# Patient Record
Sex: Female | Born: 1975 | Race: Black or African American | Hispanic: No | Smoking: Never smoker
Health system: Southern US, Community
[De-identification: ages and names within clinical notes are randomized; demographics above are authoritative.]

## PROBLEM LIST (undated history)

## (undated) DIAGNOSIS — E785 Hyperlipidemia, unspecified: Secondary | ICD-10-CM

## (undated) DIAGNOSIS — R112 Nausea with vomiting, unspecified: Secondary | ICD-10-CM

## (undated) DIAGNOSIS — I2510 Atherosclerotic heart disease of native coronary artery without angina pectoris: Secondary | ICD-10-CM

## (undated) DIAGNOSIS — E119 Type 2 diabetes mellitus without complications: Secondary | ICD-10-CM

## (undated) DIAGNOSIS — D649 Anemia, unspecified: Secondary | ICD-10-CM

## (undated) DIAGNOSIS — I10 Essential (primary) hypertension: Secondary | ICD-10-CM

## (undated) DIAGNOSIS — Z9889 Other specified postprocedural states: Secondary | ICD-10-CM

## (undated) HISTORY — PX: CORONARY ANGIOPLASTY: SHX604

## (undated) HISTORY — DX: Hyperlipidemia, unspecified: E78.5

## (undated) HISTORY — DX: Nausea with vomiting, unspecified: R11.2

## (undated) HISTORY — DX: Other specified postprocedural states: Z98.890

---

## 1995-05-29 HISTORY — PX: TUBAL LIGATION: SHX77

## 1996-05-28 HISTORY — PX: WISDOM TOOTH EXTRACTION: SHX21

## 2004-05-28 HISTORY — PX: CHOLECYSTECTOMY: SHX55

## 2005-12-03 ENCOUNTER — Emergency Department (HOSPITAL_COMMUNITY): Admission: EM | Admit: 2005-12-03 | Discharge: 2005-12-03 | Payer: Self-pay | Admitting: Emergency Medicine

## 2006-04-27 ENCOUNTER — Emergency Department (HOSPITAL_COMMUNITY): Admission: EM | Admit: 2006-04-27 | Discharge: 2006-04-27 | Payer: Self-pay | Admitting: Emergency Medicine

## 2006-08-09 ENCOUNTER — Emergency Department (HOSPITAL_COMMUNITY): Admission: EM | Admit: 2006-08-09 | Discharge: 2006-08-09 | Payer: Self-pay | Admitting: Emergency Medicine

## 2006-09-02 ENCOUNTER — Emergency Department (HOSPITAL_COMMUNITY): Admission: EM | Admit: 2006-09-02 | Discharge: 2006-09-02 | Payer: Self-pay | Admitting: Emergency Medicine

## 2006-10-10 ENCOUNTER — Emergency Department (HOSPITAL_COMMUNITY): Admission: EM | Admit: 2006-10-10 | Discharge: 2006-10-10 | Payer: Self-pay | Admitting: Emergency Medicine

## 2006-10-15 ENCOUNTER — Ambulatory Visit: Payer: Self-pay | Admitting: Family Medicine

## 2007-03-14 ENCOUNTER — Emergency Department (HOSPITAL_COMMUNITY): Admission: EM | Admit: 2007-03-14 | Discharge: 2007-03-14 | Payer: Self-pay | Admitting: *Deleted

## 2007-09-01 ENCOUNTER — Ambulatory Visit: Payer: Self-pay | Admitting: Internal Medicine

## 2007-09-03 ENCOUNTER — Ambulatory Visit: Payer: Self-pay | Admitting: Internal Medicine

## 2007-09-04 ENCOUNTER — Emergency Department (HOSPITAL_COMMUNITY): Admission: EM | Admit: 2007-09-04 | Discharge: 2007-09-04 | Payer: Self-pay | Admitting: Emergency Medicine

## 2007-09-08 ENCOUNTER — Emergency Department (HOSPITAL_COMMUNITY): Admission: EM | Admit: 2007-09-08 | Discharge: 2007-09-08 | Payer: Self-pay | Admitting: Emergency Medicine

## 2007-09-10 ENCOUNTER — Emergency Department (HOSPITAL_COMMUNITY): Admission: EM | Admit: 2007-09-10 | Discharge: 2007-09-10 | Payer: Self-pay | Admitting: Emergency Medicine

## 2007-09-24 ENCOUNTER — Emergency Department (HOSPITAL_COMMUNITY): Admission: EM | Admit: 2007-09-24 | Discharge: 2007-09-24 | Payer: Self-pay | Admitting: Emergency Medicine

## 2007-11-20 ENCOUNTER — Ambulatory Visit: Payer: Self-pay | Admitting: Family Medicine

## 2007-11-20 LAB — CONVERTED CEMR LAB: Varicella IgG: 2.9 — ABNORMAL HIGH

## 2008-01-09 ENCOUNTER — Emergency Department (HOSPITAL_COMMUNITY): Admission: EM | Admit: 2008-01-09 | Discharge: 2008-01-09 | Payer: Self-pay | Admitting: Emergency Medicine

## 2008-05-28 ENCOUNTER — Emergency Department (HOSPITAL_COMMUNITY): Admission: EM | Admit: 2008-05-28 | Discharge: 2008-05-28 | Payer: Self-pay | Admitting: Emergency Medicine

## 2009-02-13 ENCOUNTER — Emergency Department (HOSPITAL_COMMUNITY): Admission: EM | Admit: 2009-02-13 | Discharge: 2009-02-13 | Payer: Self-pay | Admitting: Emergency Medicine

## 2010-09-01 LAB — POCT PREGNANCY, URINE: Preg Test, Ur: NEGATIVE

## 2010-09-01 LAB — URINALYSIS, ROUTINE W REFLEX MICROSCOPIC
Glucose, UA: NEGATIVE mg/dL
Ketones, ur: NEGATIVE mg/dL
pH: 6 (ref 5.0–8.0)

## 2011-02-20 LAB — WET PREP, GENITAL
Clue Cells Wet Prep HPF POC: NONE SEEN
Yeast Wet Prep HPF POC: NONE SEEN

## 2011-02-20 LAB — GC/CHLAMYDIA PROBE AMP, GENITAL
Chlamydia, DNA Probe: NEGATIVE
GC Probe Amp, Genital: NEGATIVE

## 2011-02-20 LAB — URINE MICROSCOPIC-ADD ON

## 2011-02-20 LAB — URINALYSIS, ROUTINE W REFLEX MICROSCOPIC
Bilirubin Urine: NEGATIVE
Glucose, UA: NEGATIVE
Hgb urine dipstick: NEGATIVE
Ketones, ur: 15 — AB
Nitrite: NEGATIVE
Protein, ur: 30 — AB
Specific Gravity, Urine: 1.03
Urobilinogen, UA: 1
pH: 6.5

## 2011-02-20 LAB — PREGNANCY, URINE: Preg Test, Ur: NEGATIVE

## 2011-02-23 LAB — D-DIMER, QUANTITATIVE: D-Dimer, Quant: 0.31

## 2012-04-29 ENCOUNTER — Other Ambulatory Visit: Payer: Self-pay | Admitting: Obstetrics

## 2012-04-29 DIAGNOSIS — N920 Excessive and frequent menstruation with regular cycle: Secondary | ICD-10-CM

## 2012-05-02 ENCOUNTER — Ambulatory Visit (HOSPITAL_COMMUNITY)
Admission: RE | Admit: 2012-05-02 | Discharge: 2012-05-02 | Disposition: A | Discharge status: Home / Self Care | Payer: Medicaid Other | Source: Ambulatory Visit | Attending: Obstetrics | Admitting: Obstetrics

## 2012-05-02 ENCOUNTER — Ambulatory Visit (HOSPITAL_COMMUNITY): Payer: Self-pay

## 2012-05-02 DIAGNOSIS — N920 Excessive and frequent menstruation with regular cycle: Secondary | ICD-10-CM

## 2012-08-13 ENCOUNTER — Telehealth: Payer: Self-pay | Admitting: *Deleted

## 2012-08-13 NOTE — Telephone Encounter (Signed)
Pt states she is experiencing muscle cramps and leg stiffness since starting on medication from Dr Clearance Coots. She is currently taking Diazide, Provera, Tindamax and iron. Could she be having a side effect to one of these?

## 2012-08-20 NOTE — Telephone Encounter (Signed)
Stop Provera.

## 2012-08-21 NOTE — Telephone Encounter (Signed)
Pt notified to stop her provera- she has an appointment next week at which time you can reassess.

## 2012-08-28 ENCOUNTER — Ambulatory Visit: Payer: Self-pay | Admitting: Obstetrics

## 2012-09-10 ENCOUNTER — Encounter: Payer: Self-pay | Admitting: Obstetrics

## 2012-09-10 ENCOUNTER — Ambulatory Visit (INDEPENDENT_AMBULATORY_CARE_PROVIDER_SITE_OTHER): Payer: Medicaid Other | Admitting: Obstetrics

## 2012-09-10 VITALS — BP 125/87 | HR 80 | Temp 97.6°F | Ht 62.0 in | Wt 218.0 lb

## 2012-09-10 DIAGNOSIS — Z01818 Encounter for other preprocedural examination: Secondary | ICD-10-CM

## 2012-09-10 DIAGNOSIS — R350 Frequency of micturition: Secondary | ICD-10-CM

## 2012-09-10 DIAGNOSIS — N920 Excessive and frequent menstruation with regular cycle: Secondary | ICD-10-CM

## 2012-09-10 HISTORY — DX: Excessive and frequent menstruation with regular cycle: N92.0

## 2012-09-10 LAB — CBC
HCT: 35.2 % — ABNORMAL LOW (ref 36.0–46.0)
MCH: 27.6 pg (ref 26.0–34.0)
MCV: 81.1 fL (ref 78.0–100.0)
Platelets: 391 10*3/uL (ref 150–400)
RBC: 4.34 MIL/uL (ref 3.87–5.11)

## 2012-09-10 LAB — POCT URINALYSIS DIPSTICK
Bilirubin, UA: NEGATIVE
Blood, UA: NEGATIVE
Nitrite, UA: NEGATIVE
Urobilinogen, UA: NEGATIVE
pH, UA: 5

## 2012-09-10 NOTE — Patient Instructions (Signed)
Endometrial Ablation

## 2012-09-10 NOTE — Progress Notes (Signed)
Chief Complaint: 37 y.o. gravida 2 para 2 who presents with menorrhagia.  Details of Present Illness: Hx of heavy periods with large clots.  Does not want hysterectomy.  Ablation recommended and agreed to.  BP 125/87  Pulse 80  Temp(Src) 97.6 F (36.4 C) (Oral)  Ht 5\' 2"  (1.575 m)  Wt 218 lb (98.884 kg)  BMI 39.86 kg/m2  LMP 08/25/2012  No past medical history on file. History   Social History  . Marital Status: Single    Spouse Name: Maceo Pro    Number of Children: 2  . Years of Education: N/A   Occupational History  . Security Chiropractor   Social History Main Topics  . Smoking status: Never Smoker   . Smokeless tobacco: Never Used  . Alcohol Use: No  . Drug Use: No  . Sexually Active: Yes -- Female partner(s)    Birth Control/ Protection: Condom   Other Topics Concern  . Not on file   Social History Narrative  . No narrative on file   Family History  Problem Relation Age of Onset  . COPD    . Hypertension    . Diabetes    . Cancer      Pertinent items are noted in HPI.  Pre-Op Diagnosis: Menorrhagia  Planned Procedure: [facility] Endometrial Ablation  I have reviewed the patient's history and have completed the physical exam and Amy [name] is acceptable for surgery.  Zackery Barefoot, CMA 09/10/2012 10:01 AM

## 2012-10-17 ENCOUNTER — Emergency Department (HOSPITAL_COMMUNITY): Payer: Medicaid Other

## 2012-10-17 ENCOUNTER — Emergency Department (HOSPITAL_COMMUNITY)
Admission: EM | Admit: 2012-10-17 | Discharge: 2012-10-17 | Disposition: A | Discharge status: Home / Self Care | Payer: Medicaid Other | Attending: Emergency Medicine | Admitting: Emergency Medicine

## 2012-10-17 ENCOUNTER — Encounter (HOSPITAL_COMMUNITY): Payer: Self-pay | Admitting: *Deleted

## 2012-10-17 DIAGNOSIS — J9801 Acute bronchospasm: Secondary | ICD-10-CM | POA: Insufficient documentation

## 2012-10-17 DIAGNOSIS — R05 Cough: Secondary | ICD-10-CM | POA: Insufficient documentation

## 2012-10-17 DIAGNOSIS — Z79899 Other long term (current) drug therapy: Secondary | ICD-10-CM | POA: Insufficient documentation

## 2012-10-17 DIAGNOSIS — I10 Essential (primary) hypertension: Secondary | ICD-10-CM | POA: Insufficient documentation

## 2012-10-17 DIAGNOSIS — R0602 Shortness of breath: Secondary | ICD-10-CM | POA: Insufficient documentation

## 2012-10-17 DIAGNOSIS — D649 Anemia, unspecified: Secondary | ICD-10-CM | POA: Insufficient documentation

## 2012-10-17 DIAGNOSIS — R071 Chest pain on breathing: Secondary | ICD-10-CM | POA: Insufficient documentation

## 2012-10-17 DIAGNOSIS — Z792 Long term (current) use of antibiotics: Secondary | ICD-10-CM | POA: Insufficient documentation

## 2012-10-17 DIAGNOSIS — R059 Cough, unspecified: Secondary | ICD-10-CM | POA: Insufficient documentation

## 2012-10-17 HISTORY — DX: Essential (primary) hypertension: I10

## 2012-10-17 HISTORY — DX: Anemia, unspecified: D64.9

## 2012-10-17 LAB — BASIC METABOLIC PANEL
BUN: 11 mg/dL (ref 6–23)
Calcium: 9.3 mg/dL (ref 8.4–10.5)
Creatinine, Ser: 0.77 mg/dL (ref 0.50–1.10)
GFR calc non Af Amer: >90 mL/min (ref >90)
Glucose, Bld: 138 mg/dL — ABNORMAL HIGH (ref 70–99)
Sodium: 136 mEq/L (ref 135–145)

## 2012-10-17 LAB — CBC
Hemoglobin: 11 g/dL — ABNORMAL LOW (ref 12.0–15.0)
MCH: 28.6 pg (ref 26.0–34.0)
MCHC: 33.7 g/dL (ref 30.0–36.0)
MCV: 84.7 fL (ref 78.0–100.0)

## 2012-10-17 LAB — POCT I-STAT TROPONIN I

## 2012-10-17 MED ORDER — PREDNISONE 20 MG PO TABS: 40.0000 mg | ORAL_TABLET | Freq: Every day | ORAL | Status: DC

## 2012-10-17 MED ORDER — ALBUTEROL SULFATE HFA 108 (90 BASE) MCG/ACT IN AERS
2.0000 | INHALATION_SPRAY | RESPIRATORY_TRACT | Status: DC | PRN
  Administered 2012-10-17: 2 via RESPIRATORY_TRACT
  Filled 2012-10-17: qty 6.7
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
## 2012-10-17 NOTE — ED Notes (Signed)
Pt reports having mid and left side chest pains, started yesterday. Increases with cough and breathing. No acute distress noted, ekg done at triage.

## 2012-11-06 ENCOUNTER — Encounter: Payer: Self-pay | Admitting: Obstetrics & Gynecology

## 2013-03-09 ENCOUNTER — Institutional Professional Consult (permissible substitution): Payer: Medicaid Other | Admitting: Obstetrics

## 2013-03-10 ENCOUNTER — Institutional Professional Consult (permissible substitution): Payer: Medicaid Other | Admitting: Obstetrics

## 2013-04-02 ENCOUNTER — Institutional Professional Consult (permissible substitution): Payer: Medicaid Other | Admitting: Obstetrics

## 2013-06-04 ENCOUNTER — Other Ambulatory Visit: Payer: Self-pay | Admitting: Obstetrics

## 2013-06-09 ENCOUNTER — Ambulatory Visit (INDEPENDENT_AMBULATORY_CARE_PROVIDER_SITE_OTHER): Payer: Medicaid Other | Admitting: Obstetrics

## 2013-06-09 ENCOUNTER — Encounter: Payer: Self-pay | Admitting: Obstetrics

## 2013-06-09 VITALS — BP 124/92 | HR 76 | Temp 97.9°F | Ht 62.0 in | Wt 227.0 lb

## 2013-06-09 DIAGNOSIS — N946 Dysmenorrhea, unspecified: Secondary | ICD-10-CM

## 2013-06-09 DIAGNOSIS — N920 Excessive and frequent menstruation with regular cycle: Secondary | ICD-10-CM

## 2013-06-09 MED ORDER — NORETHINDRONE ACETATE 5 MG PO TABS: 10.0000 mg | ORAL_TABLET | Freq: Every day | ORAL | Status: DC

## 2013-06-09 MED ORDER — IBUPROFEN 800 MG PO TABS: 800.0000 mg | ORAL_TABLET | Freq: Three times a day (TID) | ORAL | Status: DC | PRN

## 2013-06-09 NOTE — Progress Notes (Signed)
Subjective:     Letta MoynahanShaketta Darden is a 38 y.o. female here for a consult.  Current complaints: continues to have heavy menstrual cycles with large clots.  Personal health questionnaire reviewed: yes.   Gynecologic History Patient's last menstrual period was 05/11/2013. Contraception: none Last Pap: 2013. Results were: normal Last mammogram: N/A  Obstetric History OB History  No data available     The following portions of the patient's history were reviewed and updated as appropriate: allergies, current medications, past family history, past medical history, past social history, past surgical history and problem list.  Review of Systems Pertinent items are noted in HPI.    Objective:    No exam performed today, Consult only..    Assessment:    Healthy female exam.    Plan:    Education reviewed: Management of AUB.. Follow up in: 3 weeks. Aygestin 10 mg daily x 30 days, then Ablation procedure.

## 2013-06-30 ENCOUNTER — Ambulatory Visit: Payer: Medicaid Other | Admitting: Obstetrics

## 2013-07-02 ENCOUNTER — Other Ambulatory Visit: Payer: Self-pay | Admitting: *Deleted

## 2013-07-02 ENCOUNTER — Encounter: Payer: Self-pay | Admitting: Obstetrics

## 2013-07-02 ENCOUNTER — Ambulatory Visit (INDEPENDENT_AMBULATORY_CARE_PROVIDER_SITE_OTHER): Payer: Medicaid Other | Admitting: Obstetrics

## 2013-07-02 VITALS — BP 139/83 | HR 82 | Temp 97.5°F | Ht 62.0 in | Wt 223.0 lb

## 2013-07-02 DIAGNOSIS — N920 Excessive and frequent menstruation with regular cycle: Secondary | ICD-10-CM

## 2013-07-02 NOTE — Progress Notes (Signed)
Subjective:     Amy [name] is a 38 y.o. female here for a routine exam.  Current complaints: pre-op visit to discuss ablation procedure.  Personal health questionnaire reviewed: yes.   Gynecologic History Patient's last menstrual period was 05/11/2013. Contraception: tubal ligation  Obstetric History OB History  Gravida Para Term Preterm AB SAB TAB Ectopic Multiple Living  2 2 2       2     # Outcome Date GA Lbr Len/2nd Weight Sex Delivery Anes PTL Lv  2 TRM 04/17/96 6547w0d  7 lb 1 oz (3.204 kg) M SVD EPI  Y  1 TRM 07/19/94 6847w0d  5 lb 13 oz (2.637 kg) F SVD None  Y       The following portions of the patient's history were reviewed and updated as appropriate: allergies, current medications, past family history, past medical history, past social history, past surgical history and problem list.  Review of Systems Pertinent items are noted in HPI.    Objective:    No exam performed today, Consult only.    Assessment:    AUB.   Plan:    F/U post op.    Endometrial Ablation scheduled 07-10-13.

## 2013-07-06 ENCOUNTER — Encounter: Payer: Self-pay | Admitting: Obstetrics

## 2013-07-06 ENCOUNTER — Encounter (HOSPITAL_COMMUNITY): Payer: Self-pay | Admitting: Pharmacist

## 2013-07-08 ENCOUNTER — Encounter (HOSPITAL_COMMUNITY)
Admission: RE | Admit: 2013-07-08 | Discharge: 2013-07-08 | Disposition: A | Discharge status: Home / Self Care | Payer: Medicaid Other | Source: Ambulatory Visit | Attending: Obstetrics | Admitting: Obstetrics

## 2013-07-08 ENCOUNTER — Encounter (HOSPITAL_COMMUNITY): Payer: Self-pay

## 2013-07-08 LAB — CBC
HEMATOCRIT: 36.2 % (ref 36.0–46.0)
Hemoglobin: 11.7 g/dL — ABNORMAL LOW (ref 12.0–15.0)
MCH: 27.4 pg (ref 26.0–34.0)
MCHC: 32.3 g/dL (ref 30.0–36.0)
MCV: 84.8 fL (ref 78.0–100.0)
PLATELETS: 431 10*3/uL — AB (ref 150–400)
RBC: 4.27 MIL/uL (ref 3.87–5.11)
RDW: 13.8 % (ref 11.5–15.5)
WBC: 7.7 10*3/uL (ref 4.0–10.5)

## 2013-07-08 LAB — BASIC METABOLIC PANEL
BUN: 13 mg/dL (ref 6–23)
CALCIUM: 10.1 mg/dL (ref 8.4–10.5)
CHLORIDE: 100 meq/L (ref 96–112)
CO2: 27 meq/L (ref 19–32)
CREATININE: 0.78 mg/dL (ref 0.50–1.10)
GFR calc Af Amer: >90 mL/min (ref >90)
GFR calc non Af Amer: >90 mL/min (ref >90)
Glucose, Bld: 103 mg/dL — ABNORMAL HIGH (ref 70–99)
Potassium: 4.3 mEq/L (ref 3.7–5.3)
Sodium: 137 mEq/L (ref 137–147)

## 2013-07-08 NOTE — Patient Instructions (Signed)
20 Nani SkillernShaketta [name]  07/08/2013   Your procedure is scheduled on:  07/10/13  [facility]30 AM.  Pick up the phone at the desk and dial [phone].   Call this number if you have problems the morning of surgery: [phone]   Remember:   Do not eat food:After Midnight.  Do not drink clear liquids: After Midnight.  Take these medicines the morning of surgery with A SIP OF WATER: Blood pressure medication   Do not wear jewelry, make-up or nail polish.  Do not wear lotions, powders, or perfumes. You may wear deodorant.  Do not shave 48 hours prior to surgery.  Do not bring valuables to the hospital.  [facility] is not   responsible for any belongings or valuables brought to the hospital.  Contacts, dentures or bridgework may not be worn into surgery.  Leave suitcase in the car. After surgery it may be brought to your room.  For patients admitted to the hospital, checkout time is 11:00 AM the day of              discharge.   Patients discharged the day of surgery will not be allowed to drive             home.  Name and phone number of your driver: husband   [name]  Special Instructions:     Please read over the following fact sheets that you were given:   Surgical Site Infection Prevention

## 2013-07-10 ENCOUNTER — Ambulatory Visit (HOSPITAL_COMMUNITY)
Admission: RE | Admit: 2013-07-10 | Discharge: 2013-07-10 | Disposition: A | Discharge status: Home / Self Care | Payer: Medicaid Other | Source: Ambulatory Visit | Attending: Obstetrics | Admitting: Obstetrics

## 2013-07-10 ENCOUNTER — Ambulatory Visit (HOSPITAL_COMMUNITY): Payer: Medicaid Other | Admitting: Anesthesiology

## 2013-07-10 ENCOUNTER — Encounter (HOSPITAL_COMMUNITY): Payer: Medicaid Other | Admitting: Anesthesiology

## 2013-07-10 ENCOUNTER — Encounter (HOSPITAL_COMMUNITY)
Admission: RE | Disposition: A | Discharge status: Home / Self Care | Payer: Self-pay | Source: Ambulatory Visit | Attending: Obstetrics

## 2013-07-10 DIAGNOSIS — D649 Anemia, unspecified: Secondary | ICD-10-CM | POA: Insufficient documentation

## 2013-07-10 DIAGNOSIS — N840 Polyp of corpus uteri: Secondary | ICD-10-CM | POA: Insufficient documentation

## 2013-07-10 DIAGNOSIS — N920 Excessive and frequent menstruation with regular cycle: Secondary | ICD-10-CM

## 2013-07-10 DIAGNOSIS — N925 Other specified irregular menstruation: Secondary | ICD-10-CM | POA: Insufficient documentation

## 2013-07-10 DIAGNOSIS — N946 Dysmenorrhea, unspecified: Secondary | ICD-10-CM | POA: Insufficient documentation

## 2013-07-10 DIAGNOSIS — I10 Essential (primary) hypertension: Secondary | ICD-10-CM | POA: Insufficient documentation

## 2013-07-10 DIAGNOSIS — N938 Other specified abnormal uterine and vaginal bleeding: Secondary | ICD-10-CM | POA: Insufficient documentation

## 2013-07-10 DIAGNOSIS — N949 Unspecified condition associated with female genital organs and menstrual cycle: Secondary | ICD-10-CM | POA: Insufficient documentation

## 2013-07-10 HISTORY — PX: DILITATION & CURRETTAGE/HYSTROSCOPY WITH HYDROTHERMAL ABLATION: SHX5570

## 2013-07-10 LAB — PREGNANCY, URINE: PREG TEST UR: NEGATIVE

## 2013-07-10 SURGERY — DILATATION & CURETTAGE/HYSTEROSCOPY WITH HYDROTHERMAL ABLATION
Anesthesia: General | Site: Vagina

## 2013-07-10 MED ORDER — OXYCODONE HCL 10 MG PO TABS: 10.0000 mg | ORAL_TABLET | Freq: Four times a day (QID) | ORAL | Status: DC | PRN

## 2013-07-10 MED ORDER — LIDOCAINE HCL 1 % IJ SOLN
INTRAMUSCULAR | Status: AC
  Filled 2013-07-10: qty 20

## 2013-07-10 MED ORDER — MIDAZOLAM HCL 2 MG/2ML IJ SOLN
INTRAMUSCULAR | Status: AC
  Filled 2013-07-10: qty 2

## 2013-07-10 MED ORDER — PROPOFOL 10 MG/ML IV EMUL
INTRAVENOUS | Status: AC
  Filled 2013-07-10: qty 20

## 2013-07-10 MED ORDER — MIDAZOLAM HCL 2 MG/2ML IJ SOLN: 0.5000 mg | Freq: Once | INTRAMUSCULAR | Status: DC | PRN

## 2013-07-10 MED ORDER — PROMETHAZINE HCL 25 MG/ML IJ SOLN
INTRAMUSCULAR | Status: AC
  Filled 2013-07-10: qty 1

## 2013-07-10 MED ORDER — KETOROLAC TROMETHAMINE 30 MG/ML IJ SOLN: 15.0000 mg | Freq: Once | INTRAMUSCULAR | Status: DC | PRN

## 2013-07-10 MED ORDER — FENTANYL CITRATE 0.05 MG/ML IJ SOLN
25.0000 ug | INTRAMUSCULAR | Status: DC | PRN
  Administered 2013-07-10 (×3): 50 ug via INTRAVENOUS

## 2013-07-10 MED ORDER — MEPERIDINE HCL 25 MG/ML IJ SOLN: 6.2500 mg | INTRAMUSCULAR | Status: DC | PRN

## 2013-07-10 MED ORDER — LIDOCAINE HCL (CARDIAC) 20 MG/ML IV SOLN
INTRAVENOUS | Status: AC
  Filled 2013-07-10: qty 5

## 2013-07-10 MED ORDER — KETOROLAC TROMETHAMINE 30 MG/ML IJ SOLN
INTRAMUSCULAR | Status: AC
  Filled 2013-07-10: qty 1

## 2013-07-10 MED ORDER — LACTATED RINGERS IV SOLN: INTRAVENOUS | Status: DC

## 2013-07-10 MED ORDER — DEXAMETHASONE SODIUM PHOSPHATE 10 MG/ML IJ SOLN
INTRAMUSCULAR | Status: DC | PRN
  Administered 2013-07-10: 10 mg via INTRAVENOUS

## 2013-07-10 MED ORDER — DEXAMETHASONE SODIUM PHOSPHATE 10 MG/ML IJ SOLN
INTRAMUSCULAR | Status: AC
  Filled 2013-07-10: qty 1

## 2013-07-10 MED ORDER — SODIUM CHLORIDE 0.9 % IR SOLN
Status: DC | PRN
  Administered 2013-07-10: 3000 mL

## 2013-07-10 MED ORDER — ONDANSETRON HCL 4 MG/2ML IJ SOLN
INTRAMUSCULAR | Status: DC | PRN
  Administered 2013-07-10: 4 mg via INTRAVENOUS

## 2013-07-10 MED ORDER — LIDOCAINE HCL (CARDIAC) 20 MG/ML IV SOLN
INTRAVENOUS | Status: DC | PRN
  Administered 2013-07-10: 100 mg via INTRAVENOUS

## 2013-07-10 MED ORDER — LIDOCAINE HCL 1 % IJ SOLN
INTRAMUSCULAR | Status: DC | PRN
  Administered 2013-07-10: 20 mL

## 2013-07-10 MED ORDER — FENTANYL CITRATE 0.05 MG/ML IJ SOLN
INTRAMUSCULAR | Status: AC
  Filled 2013-07-10: qty 2

## 2013-07-10 MED ORDER — LACTATED RINGERS IV SOLN
INTRAVENOUS | Status: DC | PRN
  Administered 2013-07-10: 14:00:00 via INTRAVENOUS

## 2013-07-10 MED ORDER — FENTANYL CITRATE 0.05 MG/ML IJ SOLN
INTRAMUSCULAR | Status: AC
Start: 2013-07-10 — End: 2013-07-10
  Filled 2013-07-10: qty 2

## 2013-07-10 MED ORDER — SILVER NITRATE-POT NITRATE 75-25 % EX MISC
CUTANEOUS | Status: DC | PRN
  Administered 2013-07-10: 1 via TOPICAL

## 2013-07-10 MED ORDER — ONDANSETRON HCL 4 MG/2ML IJ SOLN
INTRAMUSCULAR | Status: AC
  Filled 2013-07-10: qty 2

## 2013-07-10 MED ORDER — PROMETHAZINE HCL 25 MG/ML IJ SOLN
INTRAMUSCULAR | Status: AC
  Administered 2013-07-10: 12.5 mg via INTRAVENOUS
  Filled 2013-07-10: qty 1

## 2013-07-10 MED ORDER — FENTANYL CITRATE 0.05 MG/ML IJ SOLN
INTRAMUSCULAR | Status: DC | PRN
  Administered 2013-07-10 (×2): 100 ug via INTRAVENOUS

## 2013-07-10 MED ORDER — PROPOFOL 10 MG/ML IV BOLUS
INTRAVENOUS | Status: DC | PRN
  Administered 2013-07-10: 200 mg via INTRAVENOUS

## 2013-07-10 MED ORDER — PROMETHAZINE HCL 25 MG/ML IJ SOLN
6.2500 mg | INTRAMUSCULAR | Status: DC | PRN
  Administered 2013-07-10: 12.5 mg via INTRAVENOUS

## 2013-07-10 MED ORDER — KETOROLAC TROMETHAMINE 30 MG/ML IJ SOLN
INTRAMUSCULAR | Status: DC | PRN
  Administered 2013-07-10: 30 mg via INTRAVENOUS

## 2013-07-10 MED ORDER — FENTANYL CITRATE 0.05 MG/ML IJ SOLN
INTRAMUSCULAR | Status: AC
Start: 2013-07-10 — End: 2013-07-10
  Administered 2013-07-10: 50 ug via INTRAVENOUS
  Filled 2013-07-10: qty 2

## 2013-07-10 SURGICAL SUPPLY — 22 items
CATH ROBINSON RED A/P 16FR (CATHETERS) ×1 IMPLANT
CLOTH BEACON ORANGE TIMEOUT ST (SAFETY) ×2 IMPLANT
CONTAINER PREFILL 10% NBF 60ML (FORM) ×3 IMPLANT
DRAPE HYSTEROSCOPY (DRAPE) ×2 IMPLANT
DRSG TELFA 3X8 NADH (GAUZE/BANDAGES/DRESSINGS) ×2 IMPLANT
ELECT REM PT RETURN 9FT ADLT (ELECTROSURGICAL)
ELECTRODE REM PT RTRN 9FT ADLT (ELECTROSURGICAL) IMPLANT
GLOVE BIO SURGEON STRL SZ8 (GLOVE) ×4 IMPLANT
GOWN STRL REUS W/ TWL XL LVL3 (GOWN DISPOSABLE) ×1 IMPLANT
GOWN STRL REUS W/TWL LRG LVL3 (GOWN DISPOSABLE) ×2 IMPLANT
GOWN STRL REUS W/TWL XL LVL3 (GOWN DISPOSABLE) ×2
NDL SPNL 22GX3.5 QUINCKE BK (NEEDLE) ×2 IMPLANT
NEEDLE SPNL 22GX3.5 QUINCKE BK (NEEDLE) ×4 IMPLANT
NS IRRIG 1000ML POUR BTL (IV SOLUTION) ×1 IMPLANT
PACK VAGINAL MINOR WOMEN LF (CUSTOM PROCEDURE TRAY) ×2 IMPLANT
PAD DRESSING TELFA 3X8 NADH (GAUZE/BANDAGES/DRESSINGS) ×1 IMPLANT
PAD OB MATERNITY 4.3X12.25 (PERSONAL CARE ITEMS) ×2 IMPLANT
SET GENESYS HTA PROCERVA (MISCELLANEOUS) ×1 IMPLANT
SYR CONTROL 10ML LL (SYRINGE) ×4 IMPLANT
TOWEL OR 17X24 6PK STRL BLUE (TOWEL DISPOSABLE) ×4 IMPLANT
TUBING HYDROFLEX HYSTEROSCOPY (TUBING) ×2 IMPLANT
WATER STERILE IRR 1000ML POUR (IV SOLUTION) ×2 IMPLANT

## 2013-07-10 NOTE — Discharge Instructions (Signed)
D&C/Endometrial Ablation Care After Read the instructions below. Refer to this sheet in the next few weeks. These instructions provide you with general information on caring for yourself after you leave the hospital. Your caregiver may also give you specific instructions.  A D&C/endometrial ablation  is a minor operation. A D&C involves the stretching (dilatation) of the cervix and scraping (curettage) of the inside lining of the uterus. Endometrial ablation (EA) is a surgery that makes a woman's period much lighter or stops it completely.  You may have light cramping and bleeding for a couple of days to two weeks after the procedure. This procedure may be done in a hospital, outpatient clinic, or doctor's office. You may be given a drug to make you sleep (general anesthetic) or a drug that numbs the area (local anesthetic) in and around the cervix. HOME CARE INSTRUCTIONS  Do not drive for 24 hours.   Wait one week before returning to strenuous activities.   Take your temperature two times a day for 4 days and write it down. Provide these temperatures to your caregiver if they are abnormal (above 98.6 F or 37.0 C).   Avoid long periods of standing, and do no heavy lifting (more than 10 pounds), pushing or pulling.   Limit stair climbing to once or twice a day.   Take rest periods often.   You may resume your usual diet.   Drink plenty of fluids (6-8 glasses a day).   You should return to your usual bowel function. If constipation should occur, you may:   Take a mild laxative with permission from your caregiver.   Add fruit and bran to your diet.   Drink more fluids. This helps with constipation.   Take showers instead of baths until your caregiver gives you permission to take baths.   Do not go swimming or use a hot tub until your caregiver gives you permission.   Try to have someone with you or available for you the first 24 to 48 hours, especially if you had a general  anesthetic.   Do not douche, use tampons, or have intercourse until after your follow-up appointment, or when your caregiver approves.   Only take over-the-counter or prescription medicines for pain, discomfort, or fever as directed by your caregiver. Do not take aspirin. It can cause bleeding.   If a prescription was given, follow your caregiver's directions. You may be given a medicine that kills germs (antibiotic) to prevent an infection.   Keep all your follow-up appointments recommended by your caregiver.  SEEK MEDICAL CARE IF:  You have increasing cramps or pain not relieved with medication.   You develop belly (abdominal) pain which does not seem to be related to the same area of earlier cramping and pain.   You feel dizzy or feel like fainting.   You have bad smelling vaginal discharge.   You develop a rash.   You develop a reaction or allergy to your medication.  SEEK IMMEDIATE MEDICAL CARE IF:  Bleeding is heavier than a normal menstrual period.   You have an oral temperature above 100.6, not controlled by medicine.   You develop chest pain.   You develop shortness of breath.   You pass out.   You develop pain in your shoulder strap area.   You develop heavy vaginal bleeding with or without blood clots.  MAKE SURE YOU:   Understand these instructions.   Will watch your condition.   Will get help right away if   you are not doing well or get worse.  Document Released: 05/11/2000 Document Re-Released: 11/01/2009 Exit[employer] Patient Information 2011 [employer].  

## 2013-07-10 NOTE — Transfer of Care (Signed)
Immediate Anesthesia Transfer of Care Note  [name]  Procedure(s) Performed: Procedure(s): DILATATION & CURETTAGE/HYSTEROSCOPY WITH HYDROTHERMAL ABLATION (N/A)  Patient Location: PACU  Anesthesia Type:General  Level of Consciousness: awake, alert  and oriented  Airway & Oxygen Therapy: Patient Spontanous Breathing and Patient connected to nasal cannula oxygen  Post-op Assessment: Report given to PACU RN and Post -op Vital signs reviewed and stable  Post vital signs: Reviewed and stable  Complications: No apparent anesthesia complications

## 2013-07-10 NOTE — Op Note (Signed)
Preop Diagnosis: MENORRHAGEA, ABNORMAL UTERINE BLEEDING   Postop Diagnosis: MENORRHAGEA, ABNORMAL UTERINE BLEEDING   Procedure: DILATATION & CURETTAGE/HYSTEROSCOPY WITH HYDROTHERMAL ABLATION   Anesthesia: General   Anesthesiologist: Brayton CavesFreeman Jackson, MD   Attending: Brock Badharles A Harper, MD   Assistant:  Surgical Technician  Findings:  Normal endometrial cavity  Pathology:  Endometrial curettings  Fluids:  900ml  UOP:  50ml  EBL:  Minimal  Complications:  None  Procedure: The patient was taken to the operating room after risks benefits and alternatives were discussed with patient, the patient verbalized understanding and consent signed and witnessed. The patient was placed under general anesthesia and prepped and draped in normal sterile fashion. A bivalve speculum was placed in the patient's vagina and the anterior lip of the cervix was grasped with a single-tooth tenaculum. The cervix was dilated for passage of the hysteroscope. The uterus sounded to 8cm.  The hysteroscope was introduced into the uterine cavity with findings as noted above. A curettage was performed and currettings sent to pathology. The hysteroscope was reintroduced and hydrothermal ablation was performed without difficulty. The tenaculum and bivalve speculum were removed and there was good hemostasis at the tenaculum sites. Sponge lap and needle count was correct. The patient tolerated procedure well and was returned to the recovery room in good condition.

## 2013-07-10 NOTE — Anesthesia Procedure Notes (Signed)
Procedure Name: LMA Insertion Date/Time: 07/10/2013 2:29 PM Performed by: Rehman Levinson, Jannet AskewHARLESETTA M Pre-anesthesia Checklist: Patient identified, Timeout performed, Emergency Drugs available, Suction available and Patient being monitored Patient Re-evaluated:Patient Re-evaluated prior to inductionPreoxygenation: Pre-oxygenation with 100% oxygen Intubation Type: IV induction LMA: LMA inserted LMA Size: 4.0 Number of attempts: 1 Dental Injury: Teeth and Oropharynx as per pre-operative assessment

## 2013-07-10 NOTE — H&P (Signed)
Amy [name] is an 38 y.o. female.   Presents for Endometrial Ablation.  Pertinent Gynecological History: Menses: flow is moderate Bleeding: dysfunctional uterine bleeding Contraception: tubal ligation DES exposure: denies Blood transfusions: none Sexually transmitted diseases: no past history Previous GYN Procedures: BTL  Last mammogram: n/a Date: n/a Last pap: normal Date: 2013 OB History: G2, P2   Menstrual History: Menarche age: not asked  No LMP recorded.    Past Medical History  Diagnosis Date  . Anemia   . Hypertension     Past Surgical History  Procedure Laterality Date  . Tubal ligation  1997  . Cholecystectomy  2006  . Wisdom tooth extraction  1998    Family History  Problem Relation Age of Onset  . COPD    . Hypertension    . Diabetes    . Cancer      Social History:  reports that she has never smoked. She has never used smokeless tobacco. She reports that she does not drink alcohol or use illicit drugs.  Allergies: No Known Allergies  Prescriptions prior to admission  Medication Sig Dispense Refill  . ibuprofen (ADVIL,MOTRIN) 800 MG tablet Take 1 tablet (800 mg total) by mouth every 8 (eight) hours as needed for moderate pain.  30 tablet  5  . Iron-FA-B Cmp-C-Biot-Probiotic (FUSION PLUS) CAPS Take 1 capsule by mouth daily.      . norethindrone (AYGESTIN) 5 MG tablet Take 2 tablets (10 mg total) by mouth daily.  60 tablet  0  . triamterene-hydrochlorothiazide (DYAZIDE) 37.5-25 MG per capsule Take 1 capsule by mouth every morning.        Review of Systems  All other systems reviewed and are negative.    Height 5\' 2"  (1.575 m), weight 223 lb (101.152 kg). Physical Exam  Nursing note and vitals reviewed. Constitutional: She is oriented to person, place, and time. She appears well-developed and well-nourished.  HENT:  Head: Normocephalic and atraumatic.  Eyes: Conjunctivae are normal. Pupils are equal, round, and reactive to light.  Neck:  Normal range of motion. Neck supple.  Cardiovascular: Normal rate and regular rhythm.   Respiratory: Effort normal.  GI: Soft.  Genitourinary: Vagina normal and uterus normal.  Musculoskeletal: Normal range of motion.  Neurological: She is alert and oriented to person, place, and time.  Skin: Skin is warm and dry.  Psychiatric: She has a normal mood and affect. Her behavior is normal. Judgment and thought content normal.    No results found for this or any previous visit (from the past 24 hour(s)).  No results found.  Assessment/Plan: 38 yo G2 P2.  H/O heavy, painful periods and subsequent anemia.  Desires Endometrial Ablation after full discussion of available options.  All questions answered to patient's satisfaction.  Will proceed with planned HTA of endometrium.  HARPER,CHARLES A 07/10/2013, 12:30 PM

## 2013-07-10 NOTE — Anesthesia Postprocedure Evaluation (Signed)
  Anesthesia Post Note  [name]  Procedure(s) Performed: Procedure(s) (LRB): DILATATION & CURETTAGE/HYSTEROSCOPY WITH HYDROTHERMAL ABLATION (N/A)  Anesthesia type: GA  Patient location: PACU  Post pain: Pain level controlled  Post assessment: Post-op Vital signs reviewed  Last Vitals:  Filed Vitals:   07/10/13 1545  BP: 129/68  Pulse: 77  Temp:   Resp: 18    Post vital signs: Reviewed  Level of consciousness: sedated  Complications: No apparent anesthesia complications

## 2013-07-10 NOTE — Anesthesia Preprocedure Evaluation (Signed)
Anesthesia Evaluation  Patient identified by MRN, date of birth, ID band Patient awake    Reviewed: Allergy & Precautions, H&P , Patient's Chart, lab work & pertinent test results, reviewed documented beta blocker date and time   History of Anesthesia Complications Negative for: history of anesthetic complications  Airway Mallampati: II  TM Distance: >3 FB Neck ROM: full    Dental   Pulmonary  breath sounds clear to auscultation        Cardiovascular Exercise Tolerance: Good hypertension, Rhythm:regular Rate:Normal     Neuro/Psych negative psych ROS   GI/Hepatic   Endo/Other  Morbid obesity  Renal/GU      Musculoskeletal   Abdominal   Peds  Hematology  (+) anemia ,   Anesthesia Other Findings   Reproductive/Obstetrics                             Anesthesia Physical Anesthesia Plan  ASA: III  Anesthesia Plan: General LMA   Post-op Pain Management:    Induction:   Airway Management Planned:   Additional Equipment:   Intra-op Plan:   Post-operative Plan:   Informed Consent: I have reviewed the patients History and Physical, chart, labs and discussed the procedure including the risks, benefits and alternatives for the proposed anesthesia with the patient or authorized representative who has indicated his/her understanding and acceptance.   Dental Advisory Given  Plan Discussed with: CRNA, Surgeon and Anesthesiologist  Anesthesia Plan Comments:         Anesthesia Quick Evaluation  

## 2013-07-13 ENCOUNTER — Encounter (HOSPITAL_COMMUNITY): Payer: Self-pay | Admitting: Obstetrics

## 2013-07-23 ENCOUNTER — Encounter: Payer: Medicaid Other | Admitting: Obstetrics

## 2013-07-28 ENCOUNTER — Encounter: Payer: Self-pay | Admitting: Obstetrics

## 2013-07-28 ENCOUNTER — Ambulatory Visit (INDEPENDENT_AMBULATORY_CARE_PROVIDER_SITE_OTHER): Payer: Medicaid Other | Admitting: Obstetrics

## 2013-07-28 VITALS — BP 126/84 | HR 100 | Temp 98.2°F | Wt 221.0 lb

## 2013-07-28 DIAGNOSIS — N920 Excessive and frequent menstruation with regular cycle: Secondary | ICD-10-CM

## 2013-07-28 NOTE — Progress Notes (Signed)
Subjective:     Amy MoynahanShaketta [name] is a 38 y.o. female here for a follow up post ablasion exam.  Current complaints: Pt had D&C with ablasion on 07/10/13.  Pt states that she is doing well.  Pt states lower sides of pelvic area will be bothersome.  Pt states that her oxycodone is working for the pain.  Pt is wondering about weight restrictions.  Personal health questionnaire reviewed: yes.   Gynecologic History Patient's last menstrual period was 07/07/2013.   Obstetric History OB History  Gravida Para Term Preterm AB SAB TAB Ectopic Multiple Living  2 2 2       2     # Outcome Date GA Lbr Len/2nd Weight Sex Delivery Anes PTL Lv  2 TRM 04/17/96 38105w0d  7 lb 1 oz (3.204 kg) M SVD EPI  Y  1 TRM 07/19/94 6105w0d  5 lb 13 oz (2.637 kg) F SVD None  Y       The following portions of the patient's history were reviewed and updated as appropriate: allergies, current medications, past family history, past medical history, past social history, past surgical history and problem list.  Review of Systems Pertinent items are noted in HPI.    Objective:    General appearance: alert and no distress Abdomen: normal findings: soft, non-tender Pelvic: cervix normal in appearance, external genitalia normal, no adnexal masses or tenderness, no cervical motion tenderness, rectovaginal septum normal, uterus normal size, shape, and consistency and vagina normal without discharge    Assessment:    Healthy female exam.   AUB.  S/P Endometrial Ablation.  Doing well.   Plan:    Follow up in: 3 months.

## 2013-08-10 ENCOUNTER — Other Ambulatory Visit: Payer: Self-pay | Admitting: *Deleted

## 2013-08-10 DIAGNOSIS — D509 Iron deficiency anemia, unspecified: Secondary | ICD-10-CM

## 2013-08-10 DIAGNOSIS — I10 Essential (primary) hypertension: Secondary | ICD-10-CM

## 2013-08-10 MED ORDER — TRIAMTERENE-HCTZ 37.5-25 MG PO CAPS: 1.0000 | ORAL_CAPSULE | ORAL | Status: DC

## 2013-08-10 MED ORDER — FUSION PLUS PO CAPS: 1.0000 | ORAL_CAPSULE | Freq: Every day | ORAL | Status: DC

## 2013-10-28 ENCOUNTER — Ambulatory Visit: Payer: Medicaid Other | Admitting: Obstetrics

## 2013-11-05 ENCOUNTER — Ambulatory Visit: Payer: Medicaid Other | Admitting: Obstetrics

## 2014-03-29 ENCOUNTER — Encounter: Payer: Self-pay | Admitting: Obstetrics

## 2014-08-01 ENCOUNTER — Encounter (HOSPITAL_COMMUNITY): Payer: Self-pay | Admitting: *Deleted

## 2014-08-01 ENCOUNTER — Emergency Department (HOSPITAL_COMMUNITY)
Admission: EM | Admit: 2014-08-01 | Discharge: 2014-08-01 | Disposition: A | Discharge status: Home / Self Care | Payer: Medicaid Other | Attending: Emergency Medicine | Admitting: Emergency Medicine

## 2014-08-01 DIAGNOSIS — I10 Essential (primary) hypertension: Secondary | ICD-10-CM | POA: Insufficient documentation

## 2014-08-01 DIAGNOSIS — M79609 Pain in unspecified limb: Secondary | ICD-10-CM

## 2014-08-01 DIAGNOSIS — Z79899 Other long term (current) drug therapy: Secondary | ICD-10-CM | POA: Insufficient documentation

## 2014-08-01 DIAGNOSIS — M79662 Pain in left lower leg: Secondary | ICD-10-CM

## 2014-08-01 DIAGNOSIS — N946 Dysmenorrhea, unspecified: Secondary | ICD-10-CM

## 2014-08-01 DIAGNOSIS — Z87828 Personal history of other (healed) physical injury and trauma: Secondary | ICD-10-CM | POA: Insufficient documentation

## 2014-08-01 DIAGNOSIS — D649 Anemia, unspecified: Secondary | ICD-10-CM | POA: Insufficient documentation

## 2014-08-01 DIAGNOSIS — M7989 Other specified soft tissue disorders: Secondary | ICD-10-CM

## 2014-08-01 DIAGNOSIS — E669 Obesity, unspecified: Secondary | ICD-10-CM | POA: Insufficient documentation

## 2014-08-01 MED ORDER — IBUPROFEN 800 MG PO TABS: 800.0000 mg | ORAL_TABLET | Freq: Three times a day (TID) | ORAL | Status: DC | PRN

## 2014-08-01 MED ORDER — IBUPROFEN 400 MG PO TABS
800.0000 mg | ORAL_TABLET | Freq: Once | ORAL | Status: AC
  Administered 2014-08-01: 800 mg via ORAL
  Filled 2014-08-01: qty 2

## 2014-08-01 NOTE — ED Notes (Signed)
Declined W/C at D/C and was escorted to lobby by RN. 
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
## 2014-08-01 NOTE — Progress Notes (Signed)
VASCULAR LAB PRELIMINARY  PRELIMINARY  PRELIMINARY  PRELIMINARY  Left lower extremity venous Doppler completed.    Preliminary report:  There is no DVT or SVT noted in the left lower extremity.   Shereta Crothers, RVT 08/01/2014, 9:48 AM

## 2014-08-01 NOTE — Discharge Instructions (Signed)
RICE: Routine Care for Injuries The routine care of many injuries includes Rest, Ice, Compression, and Elevation (RICE). HOME CARE INSTRUCTIONS  Rest is needed to allow your body to heal. Routine activities can usually be resumed when comfortable. Injured tendons and bones can take up to 6 weeks to heal. Tendons are the cord-like structures that attach muscle to bone.  Ice following an injury helps keep the swelling down and reduces pain.  Put ice in a plastic bag.  Place a towel between your skin and the bag.  Leave the ice on for 15-20 minutes, 3-4 times a day, or as directed by your health care provider. Do this while awake, for the first 24 to 48 hours. After that, continue as directed by your caregiver.  Compression helps keep swelling down. It also gives support and helps with discomfort. If an elastic bandage has been applied, it should be removed and reapplied every 3 to 4 hours. It should not be applied tightly, but firmly enough to keep swelling down. Watch fingers or toes for swelling, bluish discoloration, coldness, numbness, or excessive pain. If any of these problems occur, remove the bandage and reapply loosely. Contact your caregiver if these problems continue.  Elevation helps reduce swelling and decreases pain. With extremities, such as the arms, hands, legs, and feet, the injured area should be placed near or above the level of the heart, if possible. SEEK IMMEDIATE MEDICAL CARE IF:  You have persistent pain and swelling.  You develop redness, numbness, or unexpected weakness.  Your symptoms are getting worse rather than improving after several days. These symptoms may indicate that further evaluation or further X-rays are needed. Sometimes, X-rays may not show a small broken bone (fracture) until 1 week or 10 days later. Make a follow-up appointment with your caregiver. Ask when your X-ray results will be ready. Make sure you get your X-ray results. Document Released:  08/26/2000 Document Revised: 05/19/2013 Document Reviewed: 10/13/2010 [employer] Patient Information 2015 [employer]. This information is not intended to replace advice given to you by your health care provider. Make sure you discuss any questions you have with your health care provider.  
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
## 2014-08-13 ENCOUNTER — Encounter (HOSPITAL_COMMUNITY): Payer: Self-pay | Admitting: *Deleted

## 2014-08-13 DIAGNOSIS — I10 Essential (primary) hypertension: Secondary | ICD-10-CM | POA: Insufficient documentation

## 2014-08-13 DIAGNOSIS — D649 Anemia, unspecified: Secondary | ICD-10-CM | POA: Insufficient documentation

## 2014-08-13 DIAGNOSIS — N10 Acute tubulo-interstitial nephritis: Secondary | ICD-10-CM | POA: Insufficient documentation

## 2014-08-13 LAB — CBC WITH DIFFERENTIAL/PLATELET
BASOS PCT: 0 % (ref 0–1)
Basophils Absolute: 0 10*3/uL (ref 0.0–0.1)
EOS ABS: 0.1 10*3/uL (ref 0.0–0.7)
Eosinophils Relative: 2 % (ref 0–5)
HEMATOCRIT: 34.5 % — AB (ref 36.0–46.0)
Hemoglobin: 11.4 g/dL — ABNORMAL LOW (ref 12.0–15.0)
Lymphocytes Relative: 34 % (ref 12–46)
Lymphs Abs: 2 10*3/uL (ref 0.7–4.0)
MCH: 27.7 pg (ref 26.0–34.0)
MCHC: 33 g/dL (ref 30.0–36.0)
MCV: 83.9 fL (ref 78.0–100.0)
MONO ABS: 0.4 10*3/uL (ref 0.1–1.0)
Monocytes Relative: 6 % (ref 3–12)
NEUTROS PCT: 58 % (ref 43–77)
Neutro Abs: 3.4 10*3/uL (ref 1.7–7.7)
Platelets: 344 10*3/uL (ref 150–400)
RBC: 4.11 MIL/uL (ref 3.87–5.11)
RDW: 12.9 % (ref 11.5–15.5)
WBC: 6 10*3/uL (ref 4.0–10.5)

## 2014-08-13 NOTE — ED Notes (Signed)
The pt is c/o lt flank pain  All day. No urinary symptoms  lmp none

## 2014-08-14 ENCOUNTER — Emergency Department (HOSPITAL_COMMUNITY)
Admission: EM | Admit: 2014-08-14 | Discharge: 2014-08-14 | Disposition: A | Discharge status: Home / Self Care | Payer: Medicaid Other | Attending: Emergency Medicine | Admitting: Emergency Medicine

## 2014-08-14 DIAGNOSIS — N10 Acute tubulo-interstitial nephritis: Secondary | ICD-10-CM

## 2014-08-14 DIAGNOSIS — R109 Unspecified abdominal pain: Secondary | ICD-10-CM

## 2014-08-14 LAB — URINALYSIS, ROUTINE W REFLEX MICROSCOPIC
Bilirubin Urine: NEGATIVE
Glucose, UA: NEGATIVE mg/dL
Hgb urine dipstick: NEGATIVE
Ketones, ur: NEGATIVE mg/dL
NITRITE: NEGATIVE
PH: 7.5 (ref 5.0–8.0)
Protein, ur: NEGATIVE mg/dL
Specific Gravity, Urine: 1.023 (ref 1.005–1.030)
UROBILINOGEN UA: 1 mg/dL (ref 0.0–1.0)

## 2014-08-14 LAB — COMPREHENSIVE METABOLIC PANEL
ALK PHOS: 90 U/L (ref 39–117)
ALT: 14 U/L (ref 0–35)
ANION GAP: 7 (ref 5–15)
AST: 19 U/L (ref 0–37)
Albumin: 3.3 g/dL — ABNORMAL LOW (ref 3.5–5.2)
BUN: 6 mg/dL (ref 6–23)
CO2: 28 mmol/L (ref 19–32)
Calcium: 8.6 mg/dL (ref 8.4–10.5)
Chloride: 102 mmol/L (ref 96–112)
Creatinine, Ser: 0.8 mg/dL (ref 0.50–1.10)
GFR calc Af Amer: >90 mL/min (ref >90)
GFR calc non Af Amer: >90 mL/min (ref >90)
Glucose, Bld: 131 mg/dL — ABNORMAL HIGH (ref 70–99)
Potassium: 3.7 mmol/L (ref 3.5–5.1)
SODIUM: 137 mmol/L (ref 135–145)
Total Bilirubin: 0.4 mg/dL (ref 0.3–1.2)
Total Protein: 7.5 g/dL (ref 6.0–8.3)

## 2014-08-14 LAB — URINE MICROSCOPIC-ADD ON

## 2014-08-14 LAB — LIPASE, BLOOD: Lipase: 17 U/L (ref 11–59)

## 2014-08-14 MED ORDER — ONDANSETRON HCL 4 MG PO TABS
8.0000 mg | ORAL_TABLET | Freq: Once | ORAL | Status: AC
  Administered 2014-08-14: 8 mg via ORAL
  Filled 2014-08-14: qty 2

## 2014-08-14 MED ORDER — ONDANSETRON HCL 8 MG PO TABS
8.0000 mg | ORAL_TABLET | Freq: Three times a day (TID) | ORAL | Status: DC | PRN
Start: 2014-08-14 — End: 2014-08-20

## 2014-08-14 MED ORDER — SULFAMETHOXAZOLE-TRIMETHOPRIM 800-160 MG PO TABS: 1.0000 | ORAL_TABLET | Freq: Two times a day (BID) | ORAL | Status: DC

## 2014-08-14 MED ORDER — OXYCODONE-ACETAMINOPHEN 5-325 MG PO TABS: 1.0000 | ORAL_TABLET | Freq: Four times a day (QID) | ORAL | Status: DC | PRN

## 2014-08-14 MED ORDER — SULFAMETHOXAZOLE-TRIMETHOPRIM 800-160 MG PO TABS
1.0000 | ORAL_TABLET | Freq: Once | ORAL | Status: AC
  Administered 2014-08-14: 1 via ORAL
  Filled 2014-08-14: qty 1

## 2014-08-14 MED ORDER — OXYCODONE-ACETAMINOPHEN 5-325 MG PO TABS
2.0000 | ORAL_TABLET | Freq: Once | ORAL | Status: AC
  Administered 2014-08-14: 2 via ORAL
  Filled 2014-08-14: qty 2

## 2014-08-14 NOTE — Discharge Instructions (Signed)
Flank Pain Flank pain is pain in your side. The flank is the area of your side between your upper belly (abdomen) and your back. Pain in this area can be caused by many different things. HOME CARE Home care and treatment will depend on the cause of your pain.  Rest as told by your doctor.  Drink enough fluids to keep your pee (urine) clear or pale yellow.  Only take medicine as told by your doctor.  Tell your doctor about any changes in your pain.  Follow up with your doctor. GET HELP RIGHT AWAY IF:   Your pain does not get better with medicine.   You have new symptoms or your symptoms get worse.  Your pain gets worse.   You have belly (abdominal) pain.   You are short of breath.   You always feel sick to your stomach (nauseous).   You keep throwing up (vomiting).   You have puffiness (swelling) in your belly.   You feel light-headed or you pass out (faint).   You have blood in your pee.  You have a fever or lasting symptoms for more than 2-3 days.  You have a fever and your symptoms suddenly get worse. MAKE SURE YOU:   Understand these instructions.  Will watch your condition.  Will get help right away if you are not doing well or get worse. Document Released: 02/21/2008 Document Revised: 09/28/2013 Document Reviewed: 12/27/2011 [facility] Patient Information 2015 [place], [place]. This information is not intended to replace advice given to you by your health care provider. Make sure you discuss any questions you have with your health care provider.  Pyelonephritis, Adult Pyelonephritis is a kidney infection. In general, there are 2 main types of pyelonephritis:  Infections that come on quickly without any warning (acute pyelonephritis).  Infections that persist for a long period of time (chronic pyelonephritis). CAUSES  Two main causes of pyelonephritis are:  Bacteria traveling from the bladder to the kidney. This is a problem especially in pregnant  women. The urine in the bladder can become filled with bacteria from multiple causes, including:  Inflammation of the prostate gland (prostatitis).  Sexual intercourse in females.  Bladder infection (cystitis).  Bacteria traveling from the bloodstream to the tissue part of the kidney. Problems that may increase your risk of getting a kidney infection include:  Diabetes.  Kidney stones or bladder stones.  Cancer.  Catheters placed in the bladder.  Other abnormalities of the kidney or ureter. SYMPTOMS   Abdominal pain.  Pain in the side or flank area.  Fever.  Chills.  Upset stomach.  Blood in the urine (dark urine).  Frequent urination.  Strong or persistent urge to urinate.  Burning or stinging when urinating. DIAGNOSIS  Your caregiver may diagnose your kidney infection based on your symptoms. A urine sample may also be taken. TREATMENT  In general, treatment depends on how severe the infection is.   If the infection is mild and caught early, your caregiver may treat you with oral antibiotics and send you home.  If the infection is more severe, the bacteria may have gotten into the bloodstream. This will require intravenous (IV) antibiotics and a hospital stay. Symptoms may include:  High fever.  Severe flank pain.  Shaking chills.  Even after a hospital stay, your caregiver may require you to be on oral antibiotics for a period of time.  Other treatments may be required depending upon the cause of the infection. HOME CARE INSTRUCTIONS   Take  your antibiotics as directed. Finish them even if you start to feel better.  Make an appointment to have your urine checked to make sure the infection is gone.  Drink enough fluids to keep your urine clear or pale yellow.  Take medicines for the bladder if you have urgency and frequency of urination as directed by your caregiver. SEEK IMMEDIATE MEDICAL CARE IF:   You have a fever or persistent symptoms for more  than 2-3 days.  You have a fever and your symptoms suddenly get worse.  You are unable to take your antibiotics or fluids.  You develop shaking chills.  You experience extreme weakness or fainting.  There is no improvement after 2 days of treatment. MAKE SURE YOU:  Understand these instructions.  Will watch your condition.  Will get help right away if you are not doing well or get worse. Document Released: 05/14/2005 Document Revised: 11/13/2011 Document Reviewed: 10/18/2010 [facility] Patient Information 2015 [place]. This information is not intended to replace advice given to you by your health care provider. Make sure you discuss any questions you have with your health care provider.

## 2014-08-14 NOTE — ED Provider Notes (Signed)
CSN: [id number]     Arrival date & time 08/13/14  2302 History  This chart was scribed for Amy Severinlga Cy Bresee, MD by Tanda RockersMargaux Venter, ED Scribe. This patient was seen in room D32C/D32C and the patient's care was started at 2:12 AM.     Chief Complaint  Patient presents with  . Abdominal Pain   The history is provided by the patient. No language interpreter was used.     HPI Comments: Amy [name] is a 39 y.o. female who presents to the Emergency Department complaining of left flank pain that began early yesterday afternoon. She describes it as a dull ache. Pt has taken Ibuprofen and Goodie Powder without any relief. She also complains of nausea. She denies fever, chills, frequency, hematuria, vaginal discharge, or any other symptoms. Pt denies history of kidney stones.    Past Medical History  Diagnosis Date  . Anemia   . Hypertension    Past Surgical History  Procedure Laterality Date  . Tubal ligation  1997  . Cholecystectomy  2006  . Wisdom tooth extraction  1998  . Dilitation & currettage/hystroscopy with hydrothermal ablation N/A 07/10/2013    Procedure: DILATATION & CURETTAGE/HYSTEROSCOPY WITH HYDROTHERMAL ABLATION;  Surgeon: Brock Badharles A Harper, MD;  Location: [facility];  Service: Gynecology;  Laterality: N/A;   Family History  Problem Relation Age of Onset  . COPD    . Hypertension    . Diabetes    . Cancer     History  Substance Use Topics  . Smoking status: Never Smoker   . Smokeless tobacco: Never Used  . Alcohol Use: No   OB History    Gravida Para Term Preterm AB TAB SAB Ectopic Multiple Living   2 2 2       2      Review of Systems  Constitutional: Negative for fever and chills.  Gastrointestinal: Positive for nausea.  Genitourinary: Positive for flank pain (Left flank. ). Negative for frequency, hematuria and vaginal discharge.  All other systems reviewed and are negative.     Allergies  Review of patient's allergies indicates no known allergies.  Home  Medications   Prior to Admission medications   Medication Sig Start Date End Date Taking? Authorizing Provider  Aspirin-Acetaminophen-Caffeine (GOODYS EXTRA STRENGTH PO) Take 1 packet by mouth daily as needed (pain).   Yes Historical Provider, MD  ibuprofen (ADVIL,MOTRIN) 800 MG tablet Take 1 tablet (800 mg total) by mouth every 8 (eight) hours as needed for moderate pain. 08/01/14  Yes Fayrene HelperBowie Tran, PA-C  Iron-FA-B Cmp-C-Biot-Probiotic (FUSION PLUS) CAPS Take 1 capsule by mouth daily. 08/10/13  Yes Brock Badharles A Harper, MD  Oxycodone HCl 10 MG TABS Take 1 tablet (10 mg total) by mouth 4 (four) times daily as needed. Patient taking differently: Take 10 mg by mouth 4 (four) times daily as needed (pain).  07/10/13  Yes Brock Badharles A Harper, MD  triamterene-hydrochlorothiazide (DYAZIDE) 37.5-25 MG per capsule Take 1 each (1 capsule total) by mouth every morning. 08/10/13  Yes Brock Badharles A Harper, MD   Triage Vitals: BP 152/107 mmHg  Pulse 71  Temp(Src) 99.6 F (37.6 C) (Oral)  Resp 14  Ht 5\' 2"  (1.575 m)  Wt 215 lb (97.523 kg)  BMI 39.31 kg/m2  SpO2 99%   Physical Exam  Constitutional: She is oriented to person, place, and time. She appears well-developed and well-nourished.  HENT:  Head: Normocephalic and atraumatic.  Nose: Nose normal.  Mouth/Throat: Oropharynx is clear and moist.  Eyes: Conjunctivae and  EOM are normal. Pupils are equal, round, and reactive to light.  Neck: Normal range of motion. Neck supple. No JVD present. No tracheal deviation present. No thyromegaly present.  Cardiovascular: Normal rate, regular rhythm, normal heart sounds and intact distal pulses.  Exam reveals no gallop and no friction rub.   No murmur heard. Pulmonary/Chest: Effort normal and breath sounds normal. No stridor. No respiratory distress. She has no wheezes. She has no rales. She exhibits no tenderness.  Abdominal: Soft. Bowel sounds are normal. She exhibits no distension and no mass. There is no tenderness. There is  no rebound and no guarding.  Musculoskeletal: Normal range of motion. She exhibits no edema or tenderness.  Lymphadenopathy:    She has no cervical adenopathy.  Neurological: She is alert and oriented to person, place, and time. She displays normal reflexes. She exhibits normal muscle tone. Coordination normal.  Skin: Skin is warm and dry. No rash noted. No erythema. No pallor.  Psychiatric: She has a normal mood and affect. Her behavior is normal. Judgment and thought content normal.  Nursing note and vitals reviewed.   ED Course  Procedures (including critical care time)  DIAGNOSTIC STUDIES: Oxygen Saturation is 99% on RA, normal by my interpretation.    COORDINATION OF CARE: 2:14 AM-Discussed treatment plan which includes antibiotic prescription and urine culture with pt at bedside and pt agreed to plan.   Labs Review Labs Reviewed  CBC WITH DIFFERENTIAL/PLATELET - Abnormal; Notable for the following:    Hemoglobin 11.4 (*)    HCT 34.5 (*)    All other components within normal limits  COMPREHENSIVE METABOLIC PANEL - Abnormal; Notable for the following:    Glucose, Bld 131 (*)    Albumin 3.3 (*)    All other components within normal limits  URINALYSIS, ROUTINE W REFLEX MICROSCOPIC - Abnormal; Notable for the following:    APPearance CLOUDY (*)    Leukocytes, UA MODERATE (*)    All other components within normal limits  URINE MICROSCOPIC-ADD ON - Abnormal; Notable for the following:    Squamous Epithelial / LPF FEW (*)    Bacteria, UA MANY (*)    All other components within normal limits  LIPASE, BLOOD    Imaging Review No results found.   EKG Interpretation None      MDM   Final diagnoses:  Left flank pain  Acute pyelonephritis    I personally performed the services described in this documentation, which was scribed in my presence. The recorded information has been reviewed and is accurate.  39 year old female with left flank pain started at 1 PM today.   Urinalysis shows UTI, possible [place]o.  No signs of  systemic illness.  No vaginal discharge.  Patient is not pregnant.  She is not toxic appearing.  Will start on antibiotics and pain control.  Patient given precautions for return     Amy Severin, MD 08/14/14 [phone]

## 2014-08-14 NOTE — ED Notes (Signed)
Ginger ale and graham crackers provided for patient

## 2014-08-14 NOTE — ED Notes (Signed)
Pt c/o L flank pain starting around 1pm today while shopping. Has taking BC powders, ibuprofen, and gas ex without relief. Pain radiated from L flank into lower abdomen. Denies urinary symptoms.

## 2014-08-15 LAB — URINE CULTURE

## 2014-08-20 ENCOUNTER — Emergency Department (HOSPITAL_COMMUNITY): Payer: Medicaid Other

## 2014-08-20 ENCOUNTER — Emergency Department (HOSPITAL_COMMUNITY)
Admission: EM | Admit: 2014-08-20 | Discharge: 2014-08-20 | Disposition: A | Discharge status: Home / Self Care | Payer: Self-pay | Attending: Emergency Medicine | Admitting: Emergency Medicine

## 2014-08-20 ENCOUNTER — Encounter (HOSPITAL_COMMUNITY): Payer: Self-pay | Admitting: Emergency Medicine

## 2014-08-20 DIAGNOSIS — Z79899 Other long term (current) drug therapy: Secondary | ICD-10-CM | POA: Insufficient documentation

## 2014-08-20 DIAGNOSIS — N12 Tubulo-interstitial nephritis, not specified as acute or chronic: Secondary | ICD-10-CM | POA: Insufficient documentation

## 2014-08-20 DIAGNOSIS — D649 Anemia, unspecified: Secondary | ICD-10-CM | POA: Insufficient documentation

## 2014-08-20 DIAGNOSIS — Z9851 Tubal ligation status: Secondary | ICD-10-CM | POA: Insufficient documentation

## 2014-08-20 DIAGNOSIS — Z9049 Acquired absence of other specified parts of digestive tract: Secondary | ICD-10-CM | POA: Insufficient documentation

## 2014-08-20 DIAGNOSIS — I10 Essential (primary) hypertension: Secondary | ICD-10-CM | POA: Insufficient documentation

## 2014-08-20 DIAGNOSIS — R109 Unspecified abdominal pain: Secondary | ICD-10-CM

## 2014-08-20 DIAGNOSIS — Z7952 Long term (current) use of systemic steroids: Secondary | ICD-10-CM | POA: Insufficient documentation

## 2014-08-20 LAB — URINE MICROSCOPIC-ADD ON

## 2014-08-20 LAB — I-STAT CHEM 8, ED
BUN: 11 mg/dL (ref 6–23)
CALCIUM ION: 1.1 mmol/L — AB (ref 1.12–1.23)
CHLORIDE: 99 mmol/L (ref 96–112)
CREATININE: 1 mg/dL (ref 0.50–1.10)
GLUCOSE: 163 mg/dL — AB (ref 70–99)
HCT: 42 % (ref 36.0–46.0)
Hemoglobin: 14.3 g/dL (ref 12.0–15.0)
Potassium: 4.3 mmol/L (ref 3.5–5.1)
Sodium: 134 mmol/L — ABNORMAL LOW (ref 135–145)
TCO2: 19 mmol/L (ref 0–100)

## 2014-08-20 LAB — URINALYSIS, ROUTINE W REFLEX MICROSCOPIC
Bilirubin Urine: NEGATIVE
GLUCOSE, UA: NEGATIVE mg/dL
Hgb urine dipstick: NEGATIVE
Ketones, ur: NEGATIVE mg/dL
Nitrite: NEGATIVE
PH: 5.5 (ref 5.0–8.0)
Protein, ur: NEGATIVE mg/dL
Specific Gravity, Urine: 1.028 (ref 1.005–1.030)
Urobilinogen, UA: 1 mg/dL (ref 0.0–1.0)

## 2014-08-20 LAB — CBC WITH DIFFERENTIAL/PLATELET
BASOS ABS: 0 10*3/uL (ref 0.0–0.1)
BASOS PCT: 1 % (ref 0–1)
EOS ABS: 0.1 10*3/uL (ref 0.0–0.7)
Eosinophils Relative: 2 % (ref 0–5)
HCT: 38.8 % (ref 36.0–46.0)
HEMOGLOBIN: 12.8 g/dL (ref 12.0–15.0)
LYMPHS ABS: 0.7 10*3/uL (ref 0.7–4.0)
Lymphocytes Relative: 25 % (ref 12–46)
MCH: 27.9 pg (ref 26.0–34.0)
MCHC: 33 g/dL (ref 30.0–36.0)
MCV: 84.5 fL (ref 78.0–100.0)
MONOS PCT: 25 % — AB (ref 3–12)
Monocytes Absolute: 0.7 10*3/uL (ref 0.1–1.0)
Neutro Abs: 1.4 10*3/uL — ABNORMAL LOW (ref 1.7–7.7)
Neutrophils Relative %: 47 % (ref 43–77)
Platelets: 301 10*3/uL (ref 150–400)
RBC: 4.59 MIL/uL (ref 3.87–5.11)
RDW: 13.5 % (ref 11.5–15.5)
WBC: 2.9 10*3/uL — ABNORMAL LOW (ref 4.0–10.5)

## 2014-08-20 MED ORDER — ACETAMINOPHEN 325 MG PO TABS
650.0000 mg | ORAL_TABLET | Freq: Once | ORAL | Status: AC
  Administered 2014-08-20: 650 mg via ORAL
  Filled 2014-08-20: qty 2

## 2014-08-20 MED ORDER — ONDANSETRON HCL 4 MG/2ML IJ SOLN
4.0000 mg | Freq: Once | INTRAMUSCULAR | Status: AC
  Administered 2014-08-20: 4 mg via INTRAVENOUS
  Filled 2014-08-20: qty 2

## 2014-08-20 MED ORDER — OXYCODONE-ACETAMINOPHEN 5-325 MG PO TABS: 2.0000 | ORAL_TABLET | ORAL | Status: DC | PRN

## 2014-08-20 MED ORDER — HYDROMORPHONE HCL 1 MG/ML IJ SOLN
1.0000 mg | Freq: Once | INTRAMUSCULAR | Status: AC
  Administered 2014-08-20: 1 mg via INTRAVENOUS
  Filled 2014-08-20: qty 1

## 2014-08-20 MED ORDER — CEPHALEXIN 500 MG PO CAPS: 500.0000 mg | ORAL_CAPSULE | Freq: Four times a day (QID) | ORAL | Status: DC

## 2014-08-20 MED ORDER — ONDANSETRON HCL 4 MG PO TABS: 4.0000 mg | ORAL_TABLET | Freq: Four times a day (QID) | ORAL | Status: DC

## 2014-08-20 MED ORDER — CEFTRIAXONE SODIUM 1 G IJ SOLR
1.0000 g | Freq: Once | INTRAMUSCULAR | Status: AC
  Administered 2014-08-20: 1 g via INTRAVENOUS
  Filled 2014-08-20: qty 10

## 2014-08-20 MED ORDER — SODIUM CHLORIDE 0.9 % IV BOLUS (SEPSIS)
1000.0000 mL | Freq: Once | INTRAVENOUS | Status: AC
  Administered 2014-08-20: 1000 mL via INTRAVENOUS

## 2014-08-20 NOTE — ED Provider Notes (Addendum)
CSN: [id number]     Arrival date & time 08/20/14  0847 History   First MD Initiated Contact with Patient 08/20/14 [phone]     Chief Complaint  Patient presents with  . Flank Pain     (Consider location/radiation/quality/duration/timing/severity/associated sxs/prior Treatment) HPI Comments: Patient returning to the emergency room today with continued and worsening left flank pain, nausea, dysuria, fever and chills. Patient was seen a proximally 6 days ago and diagnosed with urinary tract infection. Urine culture was done and she was discharged home with Bactrim and pain control. Since that time patient states she has taken her antibiotic as prescribed however symptoms are worsening and not improving. No prior history of kidney stones. LMP was this month  Patient is a 39 y.o. female presenting with flank pain. The history is provided by the patient.  Flank Pain This is a new problem. Episode onset: 8 days ago. The problem occurs constantly. The problem has been gradually worsening. Associated symptoms include abdominal pain. Associated symptoms comments: Fever, chills, nausea, severe left flank pain, mild dysuria.. Exacerbated by: Laying down. Relieved by: Some improvement with urinating. Treatments tried: Bactrim and Percocet. The treatment provided no relief.    Past Medical History  Diagnosis Date  . Anemia   . Hypertension    Past Surgical History  Procedure Laterality Date  . Tubal ligation  1997  . Cholecystectomy  2006  . Wisdom tooth extraction  1998  . Dilitation & currettage/hystroscopy with hydrothermal ablation N/A 07/10/2013    Procedure: DILATATION & CURETTAGE/HYSTEROSCOPY WITH HYDROTHERMAL ABLATION;  Surgeon: Brock Bad, MD;  Location: [facility];  Service: Gynecology;  Laterality: N/A;   Family History  Problem Relation Age of Onset  . COPD    . Hypertension    . Diabetes    . Cancer     History  Substance Use Topics  . Smoking status: Never Smoker   . Smokeless  tobacco: Never Used  . Alcohol Use: No   OB History    Gravida Para Term Preterm AB TAB SAB Ectopic Multiple Living   Review of Systems  Gastrointestinal: Positive for abdominal pain.  Genitourinary: Positive for flank pain.  All other systems reviewed and are negative.     Allergies  Review of patient's allergies indicates no known allergies.  Home Medications   Prior to Admission medications   Medication Sig Start Date End Date Taking? Authorizing Provider  Aspirin-Acetaminophen-Caffeine (GOODYS EXTRA STRENGTH PO) Take 1 packet by mouth daily as needed (pain).    Historical Provider, MD  ibuprofen (ADVIL,MOTRIN) 800 MG tablet Take 1 tablet (800 mg total) by mouth every 8 (eight) hours as needed for moderate pain. 08/01/14   Fayrene Helper, PA-C  Iron-FA-B Cmp-C-Biot-Probiotic (FUSION PLUS) CAPS Take 1 capsule by mouth daily. 08/10/13   Brock Bad, MD  ondansetron (ZOFRAN) 8 MG tablet Take 1 tablet (8 mg total) by mouth every 8 (eight) hours as needed for nausea or vomiting. 08/14/14   Marisa Severin, MD  Oxycodone HCl 10 MG TABS Take 1 tablet (10 mg total) by mouth 4 (four) times daily as needed. Patient taking differently: Take 10 mg by mouth 4 (four) times daily as needed (pain).  07/10/13   Brock Bad, MD  oxyCODONE-acetaminophen (PERCOCET/ROXICET) 5-325 MG per tablet Take 1-2 tablets by mouth every 6 (six) hours as needed for severe pain. 08/14/14   Marisa Severin, MD  sulfamethoxazole-trimethoprim (BACTRIM  DS,SEPTRA DS) 800-160 MG per tablet Take 1 tablet by mouth 2 (two) times daily. 08/14/14   Marisa Severinlga Otter, MD  triamterene-hydrochlorothiazide (DYAZIDE) 37.5-25 MG per capsule Take 1 each (1 capsule total) by mouth every morning. 08/10/13   Brock Badharles A Harper, MD   BP 103/51 mmHg  Pulse 96  Temp(Src) 100.4 F (38 C) (Oral)  Resp 18  Ht 5\' 2"  (1.575 m)  Wt 215 lb (97.523 kg)  BMI 39.31 kg/m2  SpO2 98% Physical Exam  Constitutional: She is oriented to person,  place, and time. She appears well-developed and well-nourished. No distress.  HENT:  Head: Normocephalic and atraumatic.  Mouth/Throat: Oropharynx is clear and moist.  Eyes: Conjunctivae and EOM are normal. Pupils are equal, round, and reactive to light.  Neck: Normal range of motion. Neck supple.  Cardiovascular: Normal rate, regular rhythm and intact distal pulses.   No murmur heard. Pulmonary/Chest: Effort normal and breath sounds normal. No respiratory distress. She has no wheezes. She has no rales.  Abdominal: Soft. She exhibits no distension. There is tenderness in the suprapubic area and left lower quadrant. There is CVA tenderness. There is no rebound and no guarding.  Left flank pain  Musculoskeletal: Normal range of motion. She exhibits no edema or tenderness.  Neurological: She is alert and oriented to person, place, and time.  Skin: Skin is warm and dry. No rash noted. No erythema.  Psychiatric: She has a normal mood and affect. Her behavior is normal.  Nursing note and vitals reviewed.   ED Course  Procedures (including critical care time) Labs Review Labs Reviewed  CBC WITH DIFFERENTIAL/PLATELET - Abnormal; Notable for the following:    WBC 2.9 (*)    Neutro Abs 1.4 (*)    Monocytes Relative 25 (*)    All other components within normal limits  URINALYSIS, ROUTINE W REFLEX MICROSCOPIC - Abnormal; Notable for the following:    APPearance CLOUDY (*)    Leukocytes, UA LARGE (*)    All other components within normal limits  URINE MICROSCOPIC-ADD ON - Abnormal; Notable for the following:    Squamous Epithelial / LPF MANY (*)    Bacteria, UA FEW (*)    All other components within normal limits  I-STAT CHEM 8, ED - Abnormal; Notable for the following:    Sodium 134 (*)    Glucose, Bld 163 (*)    Calcium, Ion 1.10 (*)    All other components within normal limits  URINE CULTURE    Imaging Review Ct Abdomen Pelvis Wo Contrast  08/20/2014   CLINICAL DATA:  Left flank  pain.  EXAM: CT ABDOMEN AND PELVIS WITHOUT CONTRAST  TECHNIQUE: Multidetector CT imaging of the abdomen and pelvis was performed following the standard protocol without IV contrast.  COMPARISON:  None.  FINDINGS: The gallbladder has been removed. Liver, biliary tree, spleen, pancreas, adrenal glands, and kidneys are normal. The bowel is normal including the terminal ileum and appendix. No free air or free fluid. 2.5 cm fibroid on the right side of the uterine fundus. Ovaries are normal. No osseous abnormality.  IMPRESSION: No acute abnormality.  Uterine fibroid.   Electronically Signed   By: Francene BoyersJames  Maxwell M.D.   On: 08/20/2014 10:35     EKG Interpretation None      MDM   Final diagnoses:  Flank pain  Pyelonephritis    Patient presenting today after being seen 6 days ago and diagnosed with urinary tract infection. She was discharged home on Bactrim and given  Percocet for pain. She says since that time she's taking her antibiotic as prescribed however she's developed worsening pain, dysuria, fever and nausea.  On exam here today patient has a temperature of 100.4 pulse of 96 and a blood pressure 103/51. She has significant left flank tenderness with some mild suprapubic and left lower quadrant tenderness. Patient had a urine culture done during her visit on 3/19 which showed multiple bacteria with no isolated organism.  Concern for pyelonephritis versus an infected kidney stone. CBC, Chem-8, UA, CT abdomen pelvis without contrast pending. Patient given IV fluids, pain and nausea control.   11:00 AM Labs consistent with persistent urinary infection most likely although the urine sample is contaminated. CT negative for stones. Will give patient Rocephin and switch to Keflex for better pyelonephritis coverage. Also patient given follow-up inject return precautions.  Gwyneth Sprout, MD 08/20/14 1101  Gwyneth Sprout, MD 08/20/14 1126

## 2014-08-20 NOTE — ED Notes (Signed)
Patient states was here on 03/18 for L flank pain and abdominal pain.   Patient states has been on antibiotics since, but is still having pain.   Patient denies urinary symptoms.

## 2014-08-22 LAB — URINE CULTURE

## 2015-01-05 ENCOUNTER — Emergency Department (HOSPITAL_COMMUNITY)
Admission: EM | Admit: 2015-01-05 | Discharge: 2015-01-05 | Disposition: A | Discharge status: Home / Self Care | Payer: Medicaid Other | Attending: Emergency Medicine | Admitting: Emergency Medicine

## 2015-01-05 ENCOUNTER — Encounter (HOSPITAL_COMMUNITY): Payer: Self-pay | Admitting: Family Medicine

## 2015-01-05 DIAGNOSIS — I10 Essential (primary) hypertension: Secondary | ICD-10-CM | POA: Insufficient documentation

## 2015-01-05 DIAGNOSIS — E669 Obesity, unspecified: Secondary | ICD-10-CM | POA: Insufficient documentation

## 2015-01-05 DIAGNOSIS — D649 Anemia, unspecified: Secondary | ICD-10-CM | POA: Insufficient documentation

## 2015-01-05 DIAGNOSIS — Z79899 Other long term (current) drug therapy: Secondary | ICD-10-CM | POA: Insufficient documentation

## 2015-01-05 DIAGNOSIS — N390 Urinary tract infection, site not specified: Secondary | ICD-10-CM

## 2015-01-05 DIAGNOSIS — R197 Diarrhea, unspecified: Secondary | ICD-10-CM | POA: Insufficient documentation

## 2015-01-05 LAB — COMPREHENSIVE METABOLIC PANEL
ALK PHOS: 87 U/L (ref 38–126)
ALT: 10 U/L — AB (ref 14–54)
AST: 19 U/L (ref 15–41)
Albumin: 3.7 g/dL (ref 3.5–5.0)
Anion gap: 7 (ref 5–15)
BILIRUBIN TOTAL: 0.5 mg/dL (ref 0.3–1.2)
CHLORIDE: 103 mmol/L (ref 101–111)
CO2: 26 mmol/L (ref 22–32)
Calcium: 9.1 mg/dL (ref 8.9–10.3)
Creatinine, Ser: 0.77 mg/dL (ref 0.44–1.00)
GFR calc non Af Amer: >60 mL/min (ref >60)
Glucose, Bld: 106 mg/dL — ABNORMAL HIGH (ref 65–99)
Potassium: 3.7 mmol/L (ref 3.5–5.1)
Sodium: 136 mmol/L (ref 135–145)
TOTAL PROTEIN: 8.4 g/dL — AB (ref 6.5–8.1)

## 2015-01-05 LAB — CBC WITH DIFFERENTIAL/PLATELET
BASOS ABS: 0 10*3/uL (ref 0.0–0.1)
Basophils Relative: 0 % (ref 0–1)
EOS ABS: 0.1 10*3/uL (ref 0.0–0.7)
EOS PCT: 1 % (ref 0–5)
HCT: 40.1 % (ref 36.0–46.0)
Hemoglobin: 13.3 g/dL (ref 12.0–15.0)
Lymphocytes Relative: 30 % (ref 12–46)
Lymphs Abs: 2.3 10*3/uL (ref 0.7–4.0)
MCH: 28.4 pg (ref 26.0–34.0)
MCHC: 33.2 g/dL (ref 30.0–36.0)
MCV: 85.7 fL (ref 78.0–100.0)
MONO ABS: 0.6 10*3/uL (ref 0.1–1.0)
Monocytes Relative: 7 % (ref 3–12)
NEUTROS ABS: 4.8 10*3/uL (ref 1.7–7.7)
Neutrophils Relative %: 61 % (ref 43–77)
Platelets: 369 10*3/uL (ref 150–400)
RBC: 4.68 MIL/uL (ref 3.87–5.11)
RDW: 13.1 % (ref 11.5–15.5)
WBC: 7.7 10*3/uL (ref 4.0–10.5)

## 2015-01-05 LAB — URINALYSIS, ROUTINE W REFLEX MICROSCOPIC
Bilirubin Urine: NEGATIVE
GLUCOSE, UA: NEGATIVE mg/dL
HGB URINE DIPSTICK: NEGATIVE
Ketones, ur: NEGATIVE mg/dL
Nitrite: NEGATIVE
PH: 5.5 (ref 5.0–8.0)
Protein, ur: NEGATIVE mg/dL
SPECIFIC GRAVITY, URINE: 1.02 (ref 1.005–1.030)
Urobilinogen, UA: 0.2 mg/dL (ref 0.0–1.0)

## 2015-01-05 LAB — URINE MICROSCOPIC-ADD ON

## 2015-01-05 LAB — LIPASE, BLOOD: LIPASE: 13 U/L — AB (ref 22–51)

## 2015-01-05 LAB — POC URINE PREG, ED: Preg Test, Ur: NEGATIVE

## 2015-01-05 MED ORDER — GI COCKTAIL ~~LOC~~
30.0000 mL | Freq: Once | ORAL | Status: AC
  Administered 2015-01-05: 30 mL via ORAL
  Filled 2015-01-05: qty 30

## 2015-01-05 MED ORDER — CEPHALEXIN 500 MG PO CAPS: 500.0000 mg | ORAL_CAPSULE | Freq: Four times a day (QID) | ORAL | Status: DC

## 2015-01-05 MED ORDER — ONDANSETRON HCL 4 MG/2ML IJ SOLN
4.0000 mg | Freq: Once | INTRAMUSCULAR | Status: AC
  Administered 2015-01-05: 4 mg via INTRAVENOUS
  Filled 2015-01-05: qty 2

## 2015-01-05 MED ORDER — DICYCLOMINE HCL 10 MG PO CAPS
10.0000 mg | ORAL_CAPSULE | Freq: Once | ORAL | Status: AC
Start: 2015-01-05 — End: 2015-01-05
  Administered 2015-01-05: 10 mg via ORAL
  Filled 2015-01-05: qty 1

## 2015-01-05 NOTE — ED Notes (Signed)
Pt here for left lower abd pain that's sharp and intermittent. sts she is very nauseous.

## 2015-01-05 NOTE — Discharge Instructions (Signed)
Please take your medications as prescribed. Take all of your antibiotics and do not share them. Follow up with your doctors as needed. Return to ED for new or worsening symptoms.  Urinary Tract Infection A urinary tract infection (UTI) can occur any place along the urinary tract. The tract includes the kidneys, ureters, bladder, and urethra. A type of germ called bacteria often causes a UTI. UTIs are often helped with antibiotic medicine.  HOME CARE   If given, take antibiotics as told by your doctor. Finish them even if you start to feel better.  Drink enough fluids to keep your pee (urine) clear or pale yellow.  Avoid tea, drinks with caffeine, and bubbly (carbonated) drinks.  Pee often. Avoid holding your pee in for a long time.  Pee before and after having sex (intercourse).  Wipe from front to back after you poop (bowel movement) if you are a woman. Use each tissue only once. GET HELP RIGHT AWAY IF:   You have back pain.  You have lower belly (abdominal) pain.  You have chills.  You feel sick to your stomach (nauseous).  You throw up (vomit).  Your burning or discomfort with peeing does not go away.  You have a fever.  Your symptoms are not better in 3 days. MAKE SURE YOU:   Understand these instructions.  Will watch your condition.  Will get help right away if you are not doing well or get worse. Document Released: 10/31/2007 Document Revised: 02/06/2012 Document Reviewed: 12/13/2011 [facility] Patient Information 2015 [place], [place]. This information is not intended to replace advice given to you by your health care provider. Make sure you discuss any questions you have with your health care provider.

## 2015-01-05 NOTE — ED Provider Notes (Signed)
CSN: [id number]     Arrival date & time 01/05/15  1147 History   First MD Initiated Contact with Patient 01/05/15 1225     Chief Complaint  Patient presents with  . Abdominal Pain     (Consider location/radiation/quality/duration/timing/severity/associated sxs/prior Treatment) HPI Amy [name] is a 39 y.o. female who comes in for evaluation of abdominal discomfort. Patient states on Tuesday she ate very greasy food at a cookout, starting on Wednesday she began to have diffuse abdominal cramping with associated nausea and 1 episode of loose stool. She denies any fevers, vomiting, urinary symptoms. She reports sleeping on her side makes her symptoms better, nothing makes them worse. She does report 10/10 pain, although she is laughing while telling her story. No other aggravating or modifying factors.  Past Medical History  Diagnosis Date  . Anemia   . Hypertension    Past Surgical History  Procedure Laterality Date  . Tubal ligation  1997  . Cholecystectomy  2006  . Wisdom tooth extraction  1998  . Dilitation & currettage/hystroscopy with hydrothermal ablation N/A 07/10/2013    Procedure: DILATATION & CURETTAGE/HYSTEROSCOPY WITH HYDROTHERMAL ABLATION;  Surgeon: Brock Bad, MD;  Location: [facility];  Service: Gynecology;  Laterality: N/A;   Family History  Problem Relation Age of Onset  . COPD    . Hypertension    . Diabetes    . Cancer     Social History  Substance Use Topics  . Smoking status: Never Smoker   . Smokeless tobacco: Never Used  . Alcohol Use: No   OB History    Gravida Para Term Preterm AB TAB SAB Ectopic Multiple Living   2 2 2       2      Review of Systems A 10 point review of systems was completed and was negative except for pertinent positives and negatives as mentioned in the history of present illness     Allergies  Review of patient's allergies indicates no known allergies.  Home Medications   Prior to Admission medications    Medication Sig Start Date End Date Taking? Authorizing Provider  ibuprofen (ADVIL,MOTRIN) 800 MG tablet Take 1 tablet (800 mg total) by mouth every 8 (eight) hours as needed for moderate pain. 08/01/14  Yes Fayrene Helper, PA-C  Iron-FA-B Cmp-C-Biot-Probiotic (FUSION PLUS) CAPS Take 1 capsule by mouth daily. 08/10/13  Yes Brock Bad, MD  triamterene-hydrochlorothiazide (DYAZIDE) 37.5-25 MG per capsule Take 1 each (1 capsule total) by mouth every morning. 08/10/13  Yes Brock Bad, MD  cephALEXin (KEFLEX) 500 MG capsule Take 1 capsule (500 mg total) by mouth 4 (four) times daily. Patient not taking: Reported on 01/05/2015 08/20/14   Gwyneth Sprout, MD  cephALEXin (KEFLEX) 500 MG capsule Take 1 capsule (500 mg total) by mouth 4 (four) times daily. 01/05/15   Joycie Peek, PA-C  ondansetron (ZOFRAN) 4 MG tablet Take 1 tablet (4 mg total) by mouth every 6 (six) hours. Patient not taking: Reported on 01/05/2015 08/20/14   Gwyneth Sprout, MD  Oxycodone HCl 10 MG TABS Take 1 tablet (10 mg total) by mouth 4 (four) times daily as needed. Patient not taking: Reported on 01/05/2015 07/10/13   Brock Bad, MD  oxyCODONE-acetaminophen (PERCOCET/ROXICET) 5-325 MG per tablet Take 2 tablets by mouth every 4 (four) hours as needed for severe pain. Patient not taking: Reported on 01/05/2015 08/20/14   Gwyneth Sprout, MD  sulfamethoxazole-trimethoprim (BACTRIM DS,SEPTRA DS) 800-160 MG per tablet Take 1 tablet by mouth  2 (two) times daily. Patient not taking: Reported on 01/05/2015 08/14/14   Marisa Severin, MD   BP 162/96 mmHg  Pulse 72  Temp(Src) 98.2 F (36.8 C) (Oral)  Resp 18  Ht  (1.575 m)  Wt 224 lb 12.8 oz (101.969 kg)  BMI 41.11 kg/m2  SpO2 100% Physical Exam  Constitutional: She is oriented to person, place, and time. She appears well-developed and well-nourished.  Obese  HENT:  Head: Normocephalic and atraumatic.  Mouth/Throat: Oropharynx is clear and moist.  Eyes: Conjunctivae are  normal. Pupils are equal, round, and reactive to light. Right eye exhibits no discharge. Left eye exhibits no discharge. No scleral icterus.  Neck: Neck supple.  Cardiovascular: Normal rate, regular rhythm and normal heart sounds.   Pulmonary/Chest: Effort normal and breath sounds normal. No respiratory distress. She has no wheezes. She has no rales.  Abdominal: Soft.  Mild diffuse tenderness with no focal tenderness. Abdomen soft, nondistended without rebound or guarding. Negative Murphy's, McBurney's  Musculoskeletal: She exhibits no tenderness.  Neurological: She is alert and oriented to person, place, and time.  Cranial Nerves II-XII grossly intact  Skin: Skin is warm and dry. No rash noted.  Psychiatric: She has a normal mood and affect.  Nursing note and vitals reviewed.   ED Course  Procedures (including critical care time) Labs Review Labs Reviewed  COMPREHENSIVE METABOLIC PANEL - Abnormal; Notable for the following:    Glucose, Bld 106 (*)    BUN <5 (*)    Total Protein 8.4 (*)    ALT 10 (*)    All other components within normal limits  LIPASE, BLOOD - Abnormal; Notable for the following:    Lipase 13 (*)    All other components within normal limits  URINALYSIS, ROUTINE W REFLEX MICROSCOPIC (NOT AT [facility]) - Abnormal; Notable for the following:    APPearance CLOUDY (*)    Leukocytes, UA LARGE (*)    All other components within normal limits  URINE MICROSCOPIC-ADD ON - Abnormal; Notable for the following:    Bacteria, UA FEW (*)    All other components within normal limits  CBC WITH DIFFERENTIAL/PLATELET  POC URINE PREG, ED    Imaging Review No results found.   EKG Interpretation None     Meds given in ED:  Medications  ondansetron (ZOFRAN) injection 4 mg (4 mg Intravenous Given 01/05/15 1231)  gi cocktail (Maalox,Lidocaine,Donnatal) (30 mLs Oral Given 01/05/15 1330)  dicyclomine (BENTYL) capsule 10 mg (10 mg Oral Given 01/05/15 1330)    New Prescriptions    CEPHALEXIN (KEFLEX) 500 MG CAPSULE    Take 1 capsule (500 mg total) by mouth 4 (four) times daily.   Filed Vitals:   01/05/15 1157  BP: 162/96  Pulse: 72  Temp: 98.2 F (36.8 C)  TempSrc: Oral  Resp: 18  Height:  (1.575 m)  Weight: 224 lb 12.8 oz (101.969 kg)  SpO2: 100%    MDM  Vitals stable -afebrile Pt resting comfortably in ED. PE--abdominal exam with mild diffuse tenderness, no focal tenderness. No peritoneal signs. Labwork--urinalysis consistent with UTI with large leukocytes, 21-50 white cells. Labs otherwise noncontributory.  DDX--will treat outpatient for UTI. No evidence of other acute or emergent pathology at this time. I discussed all relevant lab findings and imaging results with pt and they verbalized understanding. Discussed f/u with PCP within 48 hrs and return precautions, pt very amenable to plan.  Final diagnoses:  UTI (lower urinary tract infection)  Joycie Peek, PA-C 01/05/15 1354  Nelva Nay, MD 01/07/15 [phone]

## 2015-02-16 ENCOUNTER — Encounter (HOSPITAL_COMMUNITY): Payer: Self-pay

## 2015-02-16 ENCOUNTER — Emergency Department (HOSPITAL_COMMUNITY)
Admission: EM | Admit: 2015-02-16 | Discharge: 2015-02-16 | Disposition: A | Discharge status: Home / Self Care | Payer: Medicaid Other | Attending: Emergency Medicine | Admitting: Emergency Medicine

## 2015-02-16 DIAGNOSIS — Z3202 Encounter for pregnancy test, result negative: Secondary | ICD-10-CM | POA: Insufficient documentation

## 2015-02-16 DIAGNOSIS — N730 Acute parametritis and pelvic cellulitis: Secondary | ICD-10-CM

## 2015-02-16 DIAGNOSIS — I10 Essential (primary) hypertension: Secondary | ICD-10-CM | POA: Insufficient documentation

## 2015-02-16 DIAGNOSIS — D649 Anemia, unspecified: Secondary | ICD-10-CM | POA: Insufficient documentation

## 2015-02-16 DIAGNOSIS — R630 Anorexia: Secondary | ICD-10-CM | POA: Insufficient documentation

## 2015-02-16 DIAGNOSIS — Z79899 Other long term (current) drug therapy: Secondary | ICD-10-CM | POA: Insufficient documentation

## 2015-02-16 LAB — URINALYSIS, ROUTINE W REFLEX MICROSCOPIC
BILIRUBIN URINE: NEGATIVE
Glucose, UA: NEGATIVE mg/dL
Hgb urine dipstick: NEGATIVE
KETONES UR: NEGATIVE mg/dL
NITRITE: NEGATIVE
PH: 6 (ref 5.0–8.0)
PROTEIN: NEGATIVE mg/dL
Specific Gravity, Urine: 1.028 (ref 1.005–1.030)
UROBILINOGEN UA: 2 mg/dL — AB (ref 0.0–1.0)

## 2015-02-16 LAB — URINE MICROSCOPIC-ADD ON

## 2015-02-16 LAB — WET PREP, GENITAL
Trich, Wet Prep: NONE SEEN
Yeast Wet Prep HPF POC: NONE SEEN

## 2015-02-16 LAB — COMPREHENSIVE METABOLIC PANEL
ALT: 13 U/L — AB (ref 14–54)
AST: 18 U/L (ref 15–41)
Albumin: 3.9 g/dL (ref 3.5–5.0)
Alkaline Phosphatase: 85 U/L (ref 38–126)
Anion gap: 5 (ref 5–15)
BILIRUBIN TOTAL: 0.3 mg/dL (ref 0.3–1.2)
BUN: 10 mg/dL (ref 6–20)
CALCIUM: 9 mg/dL (ref 8.9–10.3)
CO2: 28 mmol/L (ref 22–32)
CREATININE: 0.74 mg/dL (ref 0.44–1.00)
Chloride: 105 mmol/L (ref 101–111)
GFR calc Af Amer: >60 mL/min (ref >60)
Glucose, Bld: 105 mg/dL — ABNORMAL HIGH (ref 65–99)
Potassium: 3.6 mmol/L (ref 3.5–5.1)
Sodium: 138 mmol/L (ref 135–145)
TOTAL PROTEIN: 8.8 g/dL — AB (ref 6.5–8.1)

## 2015-02-16 LAB — CBC
HCT: 38 % (ref 36.0–46.0)
Hemoglobin: 12.3 g/dL (ref 12.0–15.0)
MCH: 28.1 pg (ref 26.0–34.0)
MCHC: 32.4 g/dL (ref 30.0–36.0)
MCV: 86.8 fL (ref 78.0–100.0)
PLATELETS: 390 10*3/uL (ref 150–400)
RBC: 4.38 MIL/uL (ref 3.87–5.11)
RDW: 13.5 % (ref 11.5–15.5)
WBC: 6.2 10*3/uL (ref 4.0–10.5)

## 2015-02-16 LAB — I-STAT BETA HCG BLOOD, ED (MC, WL, AP ONLY)

## 2015-02-16 LAB — LIPASE, BLOOD: Lipase: 11 U/L — ABNORMAL LOW (ref 22–51)

## 2015-02-16 LAB — POC URINE PREG, ED: Preg Test, Ur: NEGATIVE

## 2015-02-16 MED ORDER — DOXYCYCLINE HYCLATE 100 MG PO CAPS: 100.0000 mg | ORAL_CAPSULE | Freq: Two times a day (BID) | ORAL | Status: DC

## 2015-02-16 MED ORDER — CEFTRIAXONE SODIUM 250 MG IJ SOLR
250.0000 mg | Freq: Once | INTRAMUSCULAR | Status: AC
  Administered 2015-02-16: 250 mg via INTRAMUSCULAR
  Filled 2015-02-16: qty 250

## 2015-02-16 MED ORDER — DOXYCYCLINE HYCLATE 100 MG PO TABS: 100.0000 mg | ORAL_TABLET | Freq: Two times a day (BID) | ORAL | Status: AC

## 2015-02-16 NOTE — ED Notes (Signed)
Pt c/o "strange cravings" and polyuria x 2 weeks and lower abdominal pain x 3 days.  Pain score 7/10.  Pt reports concern that she is pregnant, but had a tubal ligation around 18 years ago.

## 2015-02-16 NOTE — ED Provider Notes (Signed)
CSN: [id number]     Arrival date & time 02/16/15  0941 History   First MD Initiated Contact with Patient 02/16/15 1212     Chief Complaint  Patient presents with  . Abdominal Pain  . Polyuria     (Consider location/radiation/quality/duration/timing/severity/associated sxs/prior Treatment) HPI [name] is a 39 yo female with HTN, presenting with 2 week h/o lower abdominal pain.  Patient reports food cravings, nausea, and urinary frequency and urgency.  On Sunday (9/18), the pain worsened to 7/10, especially LLQ.  She describes constant, nonradiating "pressure" of lower abdomen and LLQ, unrelieved with defecation.  She reports 4 episodes of loose stool without blood or mucus.  No one else is having similar symptoms.  She had a UTI 1 month ago, treated with 10 day course of Kelfex.  Patient completed antibiotic course and denies recurrence of symptoms.  She endorses nausea, but denies vomiting.  She denies fever, chills, or dysuria.  She has no h/o GERD or DM.    She has a h/o Trichomonas infection in 2013 s/p treatment of patient and partner.  She is currently in a monogamous relationship with her fiance.  She denies vaginal discharge or bleeding, or dyspareunia.  She reports a 5-10 lb intentional weight loss over the last 2 months, through diet modification.    She is s/p tubal ligation (18 year ago) and uterine ablation in 2015 2/2 heavy periods.  Her LMP was 1 year ago.  Past Medical History  Diagnosis Date  . Anemia   . Hypertension    Past Surgical History  Procedure Laterality Date  . Tubal ligation  1997  . Cholecystectomy  2006  . Wisdom tooth extraction  1998  . Dilitation & currettage/hystroscopy with hydrothermal ablation N/A 07/10/2013    Procedure: DILATATION & CURETTAGE/HYSTEROSCOPY WITH HYDROTHERMAL ABLATION;  Surgeon: Brock Bad, MD;  Location: [facility];  Service: Gynecology;  Laterality: N/A;   Family History  Problem Relation Age of Onset  . COPD    .  Hypertension    . Diabetes    . Cancer     Social History  Substance Use Topics  . Smoking status: Never Smoker   . Smokeless tobacco: Never Used  . Alcohol Use: No   OB History    Gravida Para Term Preterm AB TAB SAB Ectopic Multiple Living   Review of Systems  Constitutional: Positive for appetite change. Negative for fever, chills, diaphoresis, activity change, fatigue and unexpected weight change.  HENT: Negative for congestion, postnasal drip, rhinorrhea, sinus pressure and sore throat.   Eyes: Negative for discharge.  Respiratory: Negative for cough, chest tightness and shortness of breath.   Cardiovascular: Negative for chest pain, palpitations and leg swelling.  Gastrointestinal: Positive for nausea, abdominal pain and diarrhea. Negative for vomiting, constipation and blood in stool.  Endocrine: Positive for polydipsia and polyuria.  Genitourinary: Positive for urgency and frequency. Negative for dysuria, hematuria, flank pain, vaginal bleeding, vaginal discharge, difficulty urinating, vaginal pain and dyspareunia.  Musculoskeletal: Negative for back pain.  Skin: Negative for rash.      Allergies  Review of patient's allergies indicates no known allergies.  Home Medications   Prior to Admission medications   Medication Sig Start Date End Date Taking? Authorizing Provider  fexofenadine-pseudoephedrine (ALLEGRA-D) 60-120 MG per tablet Take 1 tablet by mouth daily.   Yes Historical Provider, MD  ibuprofen (ADVIL,MOTRIN) 800 MG tablet Take 1  tablet (800 mg total) by mouth every 8 (eight) hours as needed for moderate pain. 08/01/14  Yes Fayrene Helper, PA-C  Iron-FA-B Cmp-C-Biot-Probiotic (FUSION PLUS) CAPS Take 1 capsule by mouth daily. 08/10/13  Yes Brock Bad, MD  cephALEXin (KEFLEX) 500 MG capsule Take 1 capsule (500 mg total) by mouth 4 (four) times daily. Patient not taking: Reported on 02/16/2015 01/05/15   Joycie Peek, PA-C  ondansetron  (ZOFRAN) 4 MG tablet Take 1 tablet (4 mg total) by mouth every 6 (six) hours. Patient not taking: Reported on 01/05/2015 08/20/14   Gwyneth Sprout, MD  Oxycodone HCl 10 MG TABS Take 1 tablet (10 mg total) by mouth 4 (four) times daily as needed. Patient not taking: Reported on 01/05/2015 07/10/13   Brock Bad, MD  oxyCODONE-acetaminophen (PERCOCET/ROXICET) 5-325 MG per tablet Take 2 tablets by mouth every 4 (four) hours as needed for severe pain. Patient not taking: Reported on 01/05/2015 08/20/14   Gwyneth Sprout, MD  sulfamethoxazole-trimethoprim (BACTRIM DS,SEPTRA DS) 800-160 MG per tablet Take 1 tablet by mouth 2 (two) times daily. Patient not taking: Reported on 01/05/2015 08/14/14   Marisa Severin, MD  triamterene-hydrochlorothiazide (DYAZIDE) 37.5-25 MG per capsule Take 1 each (1 capsule total) by mouth every morning. Patient not taking: Reported on 02/16/2015 08/10/13   Brock Bad, MD   BP 167/93 mmHg  Pulse 73  Temp(Src) 98.3 F (36.8 C) (Oral)  Resp 20  SpO2 100% Physical Exam  Constitutional: She is oriented to person, place, and time. She appears well-developed and well-nourished. No distress.  HENT:  Head: Normocephalic and atraumatic.  Eyes: EOM are normal. No scleral icterus.  Neck: Normal range of motion. No tracheal deviation present.  Cardiovascular: Normal rate, regular rhythm, normal heart sounds and intact distal pulses.   Pulmonary/Chest: Effort normal and breath sounds normal.  Abdominal: Soft. She exhibits no distension.  TTP in suprapubic region > LLQ.  No rebound and guarding.  Genitourinary:  Milky white cervical/vaginal discharge.  Positive cervical motion tenderness.  Musculoskeletal:  Trace edema to midshin  Neurological: She is alert and oriented to person, place, and time.  Skin: Skin is warm and dry. No rash noted. She is not diaphoretic.    ED Course  Procedures (including critical care time)  Labs Review Labs Reviewed  CBC  LIPASE, BLOOD   COMPREHENSIVE METABOLIC PANEL  URINALYSIS, ROUTINE W REFLEX MICROSCOPIC (NOT AT [facility])  I-STAT BETA HCG BLOOD, ED ([facility], [facility], [facility] ONLY)  POC URINE PREG, ED    Imaging Review No results found. I have personally reviewed and evaluated these images and lab results as part of my medical decision-making.   EKG Interpretation None      MDM   Final diagnoses:  None   Abdominal pain: UTI vs STI vs infectious colitis. U/A negative and normal WBC count.  Cervical/vaginal drainage of milky white discharge.  Reproduction of pain with bimanual examination.  Most likely PID, though possibly trichomonas given patient's history.  Will treat with CTX and Doxy and await Wet prep results. - CTX 250 mg IM - Doxy 100 mg PO BID for 14 days  Jana Half, MD 02/16/15 1548  Derwood Kaplan, MD 02/17/15 [phone]

## 2015-02-16 NOTE — Discharge Instructions (Signed)
1. Take Doxycycline 100 mg twice daily for 14 days. 2. Followup with [facility] if symptoms continue   Pelvic Inflammatory Disease Pelvic inflammatory disease (PID) refers to an infection in some or all of the female organs. The infection can be in the uterus, ovaries, fallopian tubes, or the surrounding tissues in the pelvis. PID can cause abdominal or pelvic pain that comes on suddenly (acute pelvic pain). PID is a serious infection because it can lead to lasting (chronic) pelvic pain or the inability to have children (infertile).  CAUSES  The infection is often caused by the normal bacteria found in the vaginal tissues. PID may also be caused by an infection that is spread during sexual contact. PID can also occur following:   The birth of a baby.   A miscarriage.   An abortion.   Major pelvic surgery.   The use of an intrauterine device (IUD).   A sexual assault.  RISK FACTORS Certain factors can put a person at higher risk for PID, such as:  Being younger than 25 years.  Being sexually active at [place] age.  Usingnonbarrier contraception.  Havingmultiple sexual partners.  Having sex with someone who has symptoms of a genital infection.  Using oral contraception. Other times, certain behaviors can increase the possibility of getting PID, such as:  Having sex during your period.  Using a vaginal douche.  Having an intrauterine device (IUD) in place. SYMPTOMS   Abdominal or pelvic pain.   Fever.   Chills.   Abnormal vaginal discharge.  Abnormal uterine bleeding.   Unusual pain shortly after finishing your period. DIAGNOSIS  Your caregiver will choose some of the following methods to make a diagnosis, such as:   Performinga physical exam and history. A pelvic exam typically reveals a very tender uterus and surrounding pelvis.   Ordering laboratory tests including a pregnancy test, blood tests, and urine test.  Orderingcultures of the  vagina and cervix to check for a sexually transmitted infection (STI).  Performing an ultrasound.   Performing a laparoscopic procedure to look inside the pelvis.  TREATMENT   Antibiotic medicines may be prescribed and taken by mouth.   Sexual partners may be treated when the infection is caused by a sexually transmitted disease (STD).   Hospitalization may be needed to give antibiotics intravenously.  Surgery may be needed, but this is rare. It may take weeks until you are completely well. If you are diagnosed with PID, you should also be checked for human immunodeficiency virus (HIV). HOME CARE INSTRUCTIONS   If given, take your antibiotics as directed. Finish the medicine even if you start to feel better.   Only take over-the-counter or prescription medicines for pain, discomfort, or fever as directed by your caregiver.   Do not have sexual intercourse until treatment is completed or as directed by your caregiver. If PID is confirmed, your recent sexual partner(s) will need treatment.   Keep your follow-up appointments. SEEK MEDICAL CARE IF:   You have increased or abnormal vaginal discharge.   You need prescription medicine for your pain.   You vomit.   You cannot take your medicines.   Your partner has an STD.  SEEK IMMEDIATE MEDICAL CARE IF:   You have a fever.   You have increased abdominal or pelvic pain.   You have chills.   You have pain when you urinate.   You are not better after 72 hours following treatment.  MAKE SURE YOU:   Understand these instructions.  Will watch your condition.  Will get help right away if you are not doing well or get worse. Document Released: 05/14/2005 Document Revised: 09/08/2012 Document Reviewed: 05/10/2011 [facility] Patient Information 2015 [place], [place]. This information is not intended to replace advice given to you by your health care provider. Make sure you discuss any questions you have with your  health care provider.

## 2015-02-17 LAB — GC/CHLAMYDIA PROBE AMP (~~LOC~~) NOT AT ARMC
Chlamydia: NEGATIVE
NEISSERIA GONORRHEA: NEGATIVE

## 2015-02-17 LAB — HEMOGLOBIN A1C
HEMOGLOBIN A1C: 6.7 % — AB (ref 4.8–5.6)
MEAN PLASMA GLUCOSE: 146 mg/dL

## 2015-02-18 LAB — URINE CULTURE

## 2015-03-10 ENCOUNTER — Emergency Department (HOSPITAL_COMMUNITY)
Admission: EM | Admit: 2015-03-10 | Discharge: 2015-03-11 | Disposition: A | Discharge status: Home / Self Care | Payer: Medicaid Other | Attending: Emergency Medicine | Admitting: Emergency Medicine

## 2015-03-10 ENCOUNTER — Encounter (HOSPITAL_COMMUNITY): Payer: Self-pay | Admitting: Emergency Medicine

## 2015-03-10 DIAGNOSIS — Y9389 Activity, other specified: Secondary | ICD-10-CM | POA: Insufficient documentation

## 2015-03-10 DIAGNOSIS — Y92410 Unspecified street and highway as the place of occurrence of the external cause: Secondary | ICD-10-CM | POA: Insufficient documentation

## 2015-03-10 DIAGNOSIS — S39012A Strain of muscle, fascia and tendon of lower back, initial encounter: Secondary | ICD-10-CM | POA: Insufficient documentation

## 2015-03-10 DIAGNOSIS — I10 Essential (primary) hypertension: Secondary | ICD-10-CM | POA: Insufficient documentation

## 2015-03-10 DIAGNOSIS — D649 Anemia, unspecified: Secondary | ICD-10-CM | POA: Insufficient documentation

## 2015-03-10 DIAGNOSIS — Y998 Other external cause status: Secondary | ICD-10-CM | POA: Insufficient documentation

## 2015-03-10 DIAGNOSIS — Z792 Long term (current) use of antibiotics: Secondary | ICD-10-CM | POA: Insufficient documentation

## 2015-03-10 DIAGNOSIS — T148XXA Other injury of unspecified body region, initial encounter: Secondary | ICD-10-CM

## 2015-03-10 DIAGNOSIS — S8001XA Contusion of right knee, initial encounter: Secondary | ICD-10-CM
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
## 2015-03-11 ENCOUNTER — Emergency Department (HOSPITAL_COMMUNITY): Payer: Medicaid Other

## 2015-03-11 MED ORDER — NAPROXEN 500 MG PO TABS: 500.0000 mg | ORAL_TABLET | Freq: Two times a day (BID) | ORAL | Status: DC

## 2015-03-11 MED ORDER — METHOCARBAMOL 500 MG PO TABS: 500.0000 mg | ORAL_TABLET | Freq: Two times a day (BID) | ORAL | Status: DC

## 2015-03-11 MED ORDER — IBUPROFEN 400 MG PO TABS
800.0000 mg | ORAL_TABLET | Freq: Once | ORAL | Status: AC
  Administered 2015-03-11: 800 mg via ORAL
  Filled 2015-03-11: qty 2
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
## 2015-06-17 ENCOUNTER — Emergency Department (HOSPITAL_BASED_OUTPATIENT_CLINIC_OR_DEPARTMENT_OTHER)
Admission: EM | Admit: 2015-06-17 | Discharge: 2015-06-17 | Disposition: A | Discharge status: Home / Self Care | Payer: 59 | Attending: Emergency Medicine | Admitting: Emergency Medicine

## 2015-06-17 ENCOUNTER — Encounter (HOSPITAL_BASED_OUTPATIENT_CLINIC_OR_DEPARTMENT_OTHER): Payer: Self-pay

## 2015-06-17 DIAGNOSIS — Z3202 Encounter for pregnancy test, result negative: Secondary | ICD-10-CM | POA: Insufficient documentation

## 2015-06-17 DIAGNOSIS — R300 Dysuria: Secondary | ICD-10-CM | POA: Diagnosis present

## 2015-06-17 DIAGNOSIS — I10 Essential (primary) hypertension: Secondary | ICD-10-CM | POA: Diagnosis not present

## 2015-06-17 DIAGNOSIS — N390 Urinary tract infection, site not specified: Secondary | ICD-10-CM | POA: Diagnosis not present

## 2015-06-17 DIAGNOSIS — Z862 Personal history of diseases of the blood and blood-forming organs and certain disorders involving the immune mechanism: Secondary | ICD-10-CM | POA: Diagnosis not present

## 2015-06-17 LAB — URINALYSIS, ROUTINE W REFLEX MICROSCOPIC
Bilirubin Urine: NEGATIVE
GLUCOSE, UA: NEGATIVE mg/dL
Ketones, ur: NEGATIVE mg/dL
Nitrite: NEGATIVE
PROTEIN: 30 mg/dL — AB
SPECIFIC GRAVITY, URINE: 1.023 (ref 1.005–1.030)
pH: 8 (ref 5.0–8.0)

## 2015-06-17 LAB — URINE MICROSCOPIC-ADD ON

## 2015-06-17 LAB — PREGNANCY, URINE: PREG TEST UR: NEGATIVE

## 2015-06-17 MED ORDER — NITROFURANTOIN MONOHYD MACRO 100 MG PO CAPS
100.0000 mg | ORAL_CAPSULE | Freq: Once | ORAL | Status: AC
  Administered 2015-06-17: 100 mg via ORAL
  Filled 2015-06-17: qty 1

## 2015-06-17 MED ORDER — HYDROCHLOROTHIAZIDE 12.5 MG PO TABS: 12.5000 mg | ORAL_TABLET | Freq: Every day | ORAL | Status: DC

## 2015-06-17 MED ORDER — NITROFURANTOIN MONOHYD MACRO 100 MG PO CAPS: 100.0000 mg | ORAL_CAPSULE | Freq: Two times a day (BID) | ORAL | Status: DC

## 2015-06-17 NOTE — ED Notes (Signed)
C/o chills, after voided she had blood on tissue when she wiped, dysuria

## 2015-06-17 NOTE — Discharge Instructions (Signed)
Please obtain all of your results from medical records or have your doctors office obtain the results - share them with your doctor - you should be seen at your doctors office in the next 2 days. Call today to arrange your follow up. Take the medications as prescribed. Please review all of the medicines and only take them if you do not have an allergy to them. Please be aware that if you are taking birth control pills, taking other prescriptions, ESPECIALLY ANTIBIOTICS may make the birth control ineffective - if this is the case, either do not engage in sexual activity or use alternative methods of birth control such as condoms until you have finished the medicine and your family doctor says it is OK to restart them. If you are on a blood thinner such as COUMADIN, be aware that any other medicine that you take may cause the coumadin to either work too much, or not enough - you should have your coumadin level rechecked in next 7 days if this is the case.  °?  °It is also a possibility that you have an allergic reaction to any of the medicines that you have been prescribed - Everybody reacts differently to medications and while MOST people have no trouble with most medicines, you may have a reaction such as nausea, vomiting, rash, swelling, shortness of breath. If this is the case, please stop taking the medicine immediately and contact your physician.  °?  °You should return to the ER if you develop severe or worsening symptoms.  ° ° °RESOURCE GUIDE ° °Chronic Pain Problems: °Contact [facility]  [phone] °Patients need to be referred by their primary care doctor. ° °Insufficient Money for Medicine: °Contact [employer]:  call "211."  ° °No Primary Care Doctor: °- Call [facility]  [phone] - can help you locate a primary care doctor that  accepts your insurance, provides certain services, etc. °- Physician Referral Service- 1-800-533-3463 ° °Agencies that provide inexpensive medical  care: °- [facility]  [phone] °- [facility]  [phone] °- [facility]  [phone] °- [facility]  [phone] °- [facility]  [phone] °- [facility]  [phone] ° °Medicaid-accepting [place] Providers: °- [facility]- [address] ° [phone], Mon-Fri 9am-7pm, Sat 9am-1pm °- [facility]- [address], [address] ° [phone] °- [facility]- [address], [address] ° [phone] °- [facility]- [address] [place] [address] ° [phone] °- [facility]- [address], [phone] ° Only accepts [place] Access Medicaid patients after they have their name  applied to their card ° °Self Pay (no insurance) in [place]: °- Sickle Cell Patients: Dr Eric Dean, [facility] ° [address], [phone] °- [facility]- [address] ° [phone] °      -     [facility]- [address] °      -     [facility]- see information above (Speak to Pam H if you do not have insurance) °      -  [facility] [place]- [address],  [phone] °      -  [facility]- [address] [place] [address], [phone] °      -  Dr Osei-Bonsu-  [address],   [place], [phone] °      -  [facility] - [address], [phone] °      -  [facility]- [address] [place] [address], [phone], also [address] °  [address], [phone] °      -    [facility]- [address], [phone], 1st & 3rd Saturday °       every month, 10am-1pm ° °[facility] °[address] °[place], [place] [address] °([phone] ° °[facility] °[address] °[place], [place] [address] °([phone]) [phone] ° °1) Find a Doctor and Pay Out of Pocket °Although you won't have to find out who is covered by your insurance plan, it is a good idea  to ask around and get recommendations. You will then need to call the office and see if the doctor you have chosen will accept you as a new patient and what types of options they offer for patients who are self-pay. Some doctors offer discounts or will set up payment plans for their patients who do not have insurance, but you will need to ask so you aren't surprised when you get to your appointment. ° °2) Contact Your Local Health Department °Not all health departments have doctors that can see patients for sick visits, but many do, so it is worth a call to see if yours does. If you don't know where your local health department is, you can check in your phone book. The CDC also has a tool to help you locate your state's health department, and many state websites also have listings of all of their local health departments. ° °3) Find a Walk-in Clinic °If your illness is not likely to be very severe or complicated, you may want to try a walk in clinic. These are popping up all over the country in pharmacies, drugstores, and shopping centers. They're usually staffed by nurse practitioners or physician assistants that have been trained to treat common illnesses and complaints. They're usually fairly quick and inexpensive. However, if you have serious medical issues or chronic medical problems, these are probably not your best option ° °STD Testing °- [place] [facility] [place], [facility], [address], [place], phone [phone] or [phone].  Monday - Friday, call for an appointment. °- [place] [facility] [place], [facility], [address], [place], phone [phone] or [phone].  Monday - Friday, call for an appointment. ° °Abuse/Neglect: °- [place] Child Abuse Hotline (336) 641-3795 °- [place] Child Abuse Hotline 800-378-5315 (After Hours) ° °Emergency Shelter:  [facility] (336) 271-5985 ° °Maternity  Homes: °- [facility] (336) 275-9566 °- [facility] (704) 372-4663 ° °MRSA Hotline #:   832-7006 ° °Dental Assistance °If unable to pay or uninsured, contact:  [place] [facility] to become qualified for the adult dental clinic. ° °Patients with Medicaid: [facility] °[address], [phone] °[address], [phone] ° °If unable to pay, or uninsured, contact [place] [facility] ([phone] in [place], [phone] in [place]) to become qualified for the adult dental clinic ° °[facility] °[address] °, [place] [address] °(336) [phone] °[email or web address] ° °Other Low-Cost Community Dental Services: °- [facility]- [address], [place], [place], [address], [phone], Ext. 123, 2nd and 4th Thursday of the month at 6:30am.  10 clients   each day by appointment, can sometimes see walk-in patients if someone does not show for an appointment. °- [facility]- [address], [place], [place], [address], [phone] °- [facility]- [address], [place], [place], [address], [phone] °- [facility]- [phone] °- [facility]- [phone] °- [facility]- [phone] °-  °

## 2015-06-17 NOTE — ED Provider Notes (Signed)
CSN: [id number]     Arrival date & time 06/17/15  1330 History   First MD Initiated Contact with Patient 06/17/15 1458     Chief Complaint  Patient presents with  . Dysuria     (Consider location/radiation/quality/duration/timing/severity/associated sxs/prior Treatment) HPI Comments: The pt has had urinary frequency with L and R sided lower abck pain X couple of days No f/c/n/v She has some burning at the end, She has no hx of DM, or abd surgery and has no vaginal complaint.  Patient is a 40 y.o. female presenting with dysuria. The history is provided by the patient.  Dysuria   Past Medical History  Diagnosis Date  . Anemia   . Hypertension    Past Surgical History  Procedure Laterality Date  . Tubal ligation  1997  . Cholecystectomy  2006  . Wisdom tooth extraction  1998  . Dilitation & currettage/hystroscopy with hydrothermal ablation N/A 07/10/2013    Procedure: DILATATION & CURETTAGE/HYSTEROSCOPY WITH HYDROTHERMAL ABLATION;  Surgeon: Brock Bad, MD;  Location: [facility];  Service: Gynecology;  Laterality: N/A;   Family History  Problem Relation Age of Onset  . COPD    . Hypertension    . Diabetes    . Cancer     Social History  Substance Use Topics  . Smoking status: Never Smoker   . Smokeless tobacco: Never Used  . Alcohol Use: No   OB History    Gravida Para Term Preterm AB TAB SAB Ectopic Multiple Living   Review of Systems  Genitourinary: Positive for dysuria.  All other systems reviewed and are negative.     Allergies  Review of patient's allergies indicates no known allergies.  Home Medications   Prior to Admission medications   Medication Sig Start Date End Date Taking? Authorizing Provider  hydrochlorothiazide (HYDRODIURIL) 12.5 MG tablet Take 1 tablet (12.5 mg total) by mouth daily. 06/17/15   Eber Hong, MD  nitrofurantoin, macrocrystal-monohydrate, (MACROBID) 100 MG capsule Take 1 capsule (100 mg total) by mouth 2  (two) times daily. 06/17/15   Eber Hong, MD   BP 159/109 mmHg  Pulse 86  Temp(Src) 99 F (37.2 C) (Oral)  Resp 18  Ht  (1.626 m)  Wt 219 lb (99.338 kg)  BMI 37.57 kg/m2  SpO2 100% Physical Exam  Constitutional: She appears well-developed and well-nourished. No distress.  HENT:  Head: Normocephalic and atraumatic.  Mouth/Throat: Oropharynx is clear and moist. No oropharyngeal exudate.  Eyes: Conjunctivae and EOM are normal. Pupils are equal, round, and reactive to light. Right eye exhibits no discharge. Left eye exhibits no discharge. No scleral icterus.  Neck: Normal range of motion. Neck supple. No JVD present. No thyromegaly present.  Cardiovascular: Normal rate, regular rhythm, normal heart sounds and intact distal pulses.  Exam reveals no gallop and no friction rub.   No murmur heard. Pulmonary/Chest: Effort normal and breath sounds normal. No respiratory distress. She has no wheezes. She has no rales.  Abdominal: Soft. Bowel sounds are normal. She exhibits no distension and no mass. There is no tenderness.  No abd ttp - mild bilateral CVA ttp  Musculoskeletal: Normal range of motion. She exhibits no edema or tenderness.  Lymphadenopathy:    She has no cervical adenopathy.  Neurological: She is alert. Coordination normal.  Skin: Skin is warm and dry. No rash noted. No erythema.  Psychiatric: She has a normal mood and  affect. Her behavior is normal.  Nursing note and vitals reviewed.   ED Course  Procedures (including critical care time) Labs Review Labs Reviewed  URINALYSIS, ROUTINE W REFLEX MICROSCOPIC (NOT AT [facility]) - Abnormal; Notable for the following:    APPearance CLOUDY (*)    Hgb urine dipstick MODERATE (*)    Protein, ur 30 (*)    Leukocytes, UA LARGE (*)    All other components within normal limits  URINE MICROSCOPIC-ADD ON - Abnormal; Notable for the following:    Squamous Epithelial / LPF 6-30 (*)    Bacteria, UA MANY (*)    All other components  within normal limits  URINE CULTURE  PREGNANCY, URINE    Imaging Review No results found. I have personally reviewed and evaluated these images and lab results as part of my medical decision-making.    MDM   Final diagnoses:  UTI (lower urinary tract infection)  Essential hypertension    UTI likely given uA results with bacteria and leukocytes.  No fever or tachycardia - she has elevated blood pressure - d/w pt - she has hx of hypertension, has been treated in the past with medicines, she thinks it was a diuretic. Recheck of her blood sugar showed that she was still elevated close to 100 diastolic, will start hydrochlorothiazide in addition to other medications. Patient is in agreement.   Meds given in ED:  Medications  nitrofurantoin (macrocrystal-monohydrate) (MACROBID) capsule 100 mg (not administered)    New Prescriptions   HYDROCHLOROTHIAZIDE (HYDRODIURIL) 12.5 MG TABLET    Take 1 tablet (12.5 mg total) by mouth daily.   NITROFURANTOIN, MACROCRYSTAL-MONOHYDRATE, (MACROBID) 100 MG CAPSULE    Take 1 capsule (100 mg total) by mouth 2 (two) times daily.      Eber Hong, MD 06/17/15 [id number]

## 2015-06-19 LAB — URINE CULTURE: Special Requests: NORMAL

## 2015-08-26 ENCOUNTER — Emergency Department (HOSPITAL_COMMUNITY): Payer: Self-pay

## 2015-08-26 ENCOUNTER — Emergency Department (HOSPITAL_COMMUNITY)
Admission: EM | Admit: 2015-08-26 | Discharge: 2015-08-26 | Disposition: A | Discharge status: Home / Self Care | Payer: Self-pay | Attending: Emergency Medicine | Admitting: Emergency Medicine

## 2015-08-26 DIAGNOSIS — R52 Pain, unspecified: Secondary | ICD-10-CM

## 2015-08-26 DIAGNOSIS — Z79899 Other long term (current) drug therapy: Secondary | ICD-10-CM | POA: Insufficient documentation

## 2015-08-26 DIAGNOSIS — Z862 Personal history of diseases of the blood and blood-forming organs and certain disorders involving the immune mechanism: Secondary | ICD-10-CM | POA: Insufficient documentation

## 2015-08-26 DIAGNOSIS — I10 Essential (primary) hypertension: Secondary | ICD-10-CM | POA: Insufficient documentation

## 2015-08-26 DIAGNOSIS — Z9851 Tubal ligation status: Secondary | ICD-10-CM | POA: Insufficient documentation

## 2015-08-26 DIAGNOSIS — Z9049 Acquired absence of other specified parts of digestive tract: Secondary | ICD-10-CM | POA: Insufficient documentation

## 2015-08-26 DIAGNOSIS — N390 Urinary tract infection, site not specified: Secondary | ICD-10-CM | POA: Insufficient documentation

## 2015-08-26 LAB — URINE MICROSCOPIC-ADD ON

## 2015-08-26 LAB — URINALYSIS, ROUTINE W REFLEX MICROSCOPIC
BILIRUBIN URINE: NEGATIVE
Glucose, UA: NEGATIVE mg/dL
KETONES UR: NEGATIVE mg/dL
Nitrite: NEGATIVE
PH: 5.5 (ref 5.0–8.0)
PROTEIN: NEGATIVE mg/dL
Specific Gravity, Urine: 1.022 (ref 1.005–1.030)

## 2015-08-26 MED ORDER — NITROFURANTOIN MONOHYD MACRO 100 MG PO CAPS: 100.0000 mg | ORAL_CAPSULE | Freq: Two times a day (BID) | ORAL | Status: DC

## 2015-08-26 MED ORDER — ONDANSETRON 4 MG PO TBDP
4.0000 mg | ORAL_TABLET | Freq: Once | ORAL | Status: AC
  Administered 2015-08-26: 4 mg via ORAL
  Filled 2015-08-26: qty 1

## 2015-08-26 NOTE — ED Provider Notes (Signed)
CSN: [id number]     Arrival date & time 08/26/15  [id number] History   First MD Initiated Contact with Patient 08/26/15 [phone]     Chief Complaint  Patient presents with  . Flank Pain  . Back Pain      HPI Pt reports lower back pain and left flank pain x 3 days. A few days ago noticed some blood in her urine. Denies urinary s/s at this time. Reports similar s/s prior with kidney infection.           Past Medical History  Diagnosis Date  . Anemia   . Hypertension    Past Surgical History  Procedure Laterality Date  . Tubal ligation  1997  . Cholecystectomy  2006  . Wisdom tooth extraction  1998  . Dilitation & currettage/hystroscopy with hydrothermal ablation N/A 07/10/2013    Procedure: DILATATION & CURETTAGE/HYSTEROSCOPY WITH HYDROTHERMAL ABLATION;  Surgeon: Brock Badharles A Harper, MD;  Location: [facility];  Service: Gynecology;  Laterality: N/A;   Family History  Problem Relation Age of Onset  . COPD    . Hypertension    . Diabetes    . Cancer     Social History  Substance Use Topics  . Smoking status: Never Smoker   . Smokeless tobacco: Never Used  . Alcohol Use: No   OB History    Gravida Para Term Preterm AB TAB SAB Ectopic Multiple Living   2 2 2       2      Review of Systems  All other systems reviewed and are negative.     Allergies  Review of patient's allergies indicates no known allergies.  Home Medications   Prior to Admission medications   Medication Sig Start Date End Date Taking? Authorizing Provider  hydrochlorothiazide (HYDRODIURIL) 12.5 MG tablet Take 1 tablet (12.5 mg total) by mouth daily. 06/17/15   Eber HongBrian Miller, MD  nitrofurantoin, macrocrystal-monohydrate, (MACROBID) 100 MG capsule Take 1 capsule (100 mg total) by mouth 2 (two) times daily. 08/26/15   Nelva Nayobert Soriah Leeman, MD   BP 118/46 mmHg  Pulse 74  Temp(Src) 98.3 F (36.8 C) (Oral)  Resp 14  SpO2 100% Physical Exam  Constitutional: She is oriented to person, place, and time. She appears  well-developed and well-nourished. No distress.  HENT:  Head: Normocephalic and atraumatic.  Eyes: Pupils are equal, round, and reactive to light.  Neck: Normal range of motion.  Cardiovascular: Normal rate and intact distal pulses.   Pulmonary/Chest: No respiratory distress.  Abdominal: Normal appearance. She exhibits no distension.  Musculoskeletal:       Back:  Neurological: She is alert and oriented to person, place, and time. No cranial nerve deficit.  Skin: Skin is warm and dry. No rash noted.  Psychiatric: She has a normal mood and affect. Her behavior is normal.  Nursing note and vitals reviewed.   ED Course  Procedures (including critical care time) Labs Review Labs Reviewed  URINALYSIS, ROUTINE W REFLEX MICROSCOPIC (NOT AT [facility]) - Abnormal; Notable for the following:    APPearance CLOUDY (*)    Hgb urine dipstick TRACE (*)    Leukocytes, UA LARGE (*)    All other components within normal limits  URINE MICROSCOPIC-ADD ON - Abnormal; Notable for the following:    Squamous Epithelial / LPF 6-30 (*)    Bacteria, UA MANY (*)    All other components within normal limits  URINE CULTURE    Imaging Review Mr Lumbar Spine Wo Contrast  08/26/2015  CLINICAL DATA:  39 year old female with lumbar back pain radiating to the left flank for 3 days. Gross hematuria a few days ago. Initial encounter. EXAM: MRI LUMBAR SPINE WITHOUT CONTRAST TECHNIQUE: Multiplanar, multisequence MR imaging of the lumbar spine was performed. No intravenous contrast was administered. COMPARISON:  CT Abdomen and Pelvis 08/20/2014. FINDINGS: Normal lumbar segmentation demonstrated on the comparison. Normal vertebral height and alignment. No marrow edema or evidence of acute osseous abnormality. Visualized lower thoracic spinal cord is normal with conus medularis at L1-L2. Normal noncontrast appearance of the visible kidneys. No hydronephrosis or perinephric stranding. No hydroureter. Chronic sub serosal uterine  fibroid measures 3.3 cm diameter, increased from roughly 25 mm in 2016. Other visualized abdominal and pelvic viscera are normal; trace pelvic free fluid likely is physiologic. Negative visualized posterior paraspinal soft tissues. T10-T11:  Mild facet hypertrophy. T11-T12:  Mild left facet hypertrophy. T12-L1:  Negative. L1-L2:  Negative. L2-L3:  Negative. L3-L4:  Mild facet hypertrophy. L4-L5: Mild to moderate facet hypertrophy. Trace facet joint fluid. No stenosis. L5-S1: Mild facet hypertrophy. Mild endplate spurring. No stenosis. IMPRESSION: 1. No lumbar disc degeneration or neural impingement identified. Multilevel mild to moderate facet degeneration, maximal at L4-L5. 2. Normal noncontrast MRI appearance of the visualized kidneys and ureters. 3. Subserosal fibroid at the uterine fundus appears mildly enlarged since 2016, 3.3 cm diameter. Electronically Signed   By: Odessa Fleming M.D.   On: 08/26/2015 13:22   I have personally reviewed and evaluated these images and lab results as part of my medical decision-making.    MDM   Final diagnoses:  Pain  Chronic UTI        Nelva Nay, MD 08/26/15 1408

## 2015-08-26 NOTE — ED Notes (Signed)
Pt reports lower back pain and left flank pain x 3 days. A few days ago noticed some blood in her urine. Denies urinary s/s at this time. Reports similar s/s prior with kidney infection.

## 2015-08-26 NOTE — Discharge Instructions (Signed)

## 2015-08-27 LAB — URINE CULTURE

## 2015-11-02 ENCOUNTER — Other Ambulatory Visit: Payer: Self-pay | Admitting: Family

## 2015-11-02 DIAGNOSIS — M25561 Pain in right knee: Secondary | ICD-10-CM

## 2015-11-09 ENCOUNTER — Other Ambulatory Visit: Payer: 59

## 2015-11-14 ENCOUNTER — Ambulatory Visit
Admission: RE | Admit: 2015-11-14 | Discharge: 2015-11-14 | Disposition: A | Discharge status: Home / Self Care | Payer: BLUE CROSS/BLUE SHIELD | Source: Ambulatory Visit | Attending: Family | Admitting: Family

## 2015-11-14 DIAGNOSIS — M25561 Pain in right knee: Secondary | ICD-10-CM

## 2016-02-05 ENCOUNTER — Encounter (HOSPITAL_BASED_OUTPATIENT_CLINIC_OR_DEPARTMENT_OTHER): Payer: Self-pay | Admitting: Emergency Medicine

## 2016-02-05 ENCOUNTER — Emergency Department (HOSPITAL_BASED_OUTPATIENT_CLINIC_OR_DEPARTMENT_OTHER)
Admission: EM | Admit: 2016-02-05 | Discharge: 2016-02-05 | Disposition: A | Discharge status: Home / Self Care | Payer: BLUE CROSS/BLUE SHIELD | Attending: Emergency Medicine | Admitting: Emergency Medicine

## 2016-02-05 DIAGNOSIS — N390 Urinary tract infection, site not specified: Secondary | ICD-10-CM

## 2016-02-05 DIAGNOSIS — Z79899 Other long term (current) drug therapy: Secondary | ICD-10-CM | POA: Insufficient documentation

## 2016-02-05 DIAGNOSIS — I10 Essential (primary) hypertension: Secondary | ICD-10-CM | POA: Insufficient documentation

## 2016-02-05 LAB — URINALYSIS, ROUTINE W REFLEX MICROSCOPIC
Bilirubin Urine: NEGATIVE
Glucose, UA: NEGATIVE mg/dL
Hgb urine dipstick: NEGATIVE
KETONES UR: NEGATIVE mg/dL
NITRITE: NEGATIVE
PH: 5.5 (ref 5.0–8.0)
Protein, ur: NEGATIVE mg/dL
SPECIFIC GRAVITY, URINE: 1.024 (ref 1.005–1.030)

## 2016-02-05 LAB — URINE MICROSCOPIC-ADD ON

## 2016-02-05 MED ORDER — CEPHALEXIN 500 MG PO CAPS: 500.0000 mg | ORAL_CAPSULE | Freq: Four times a day (QID) | ORAL | 0 refills | Status: DC

## 2016-02-05 NOTE — ED Provider Notes (Signed)
[facility]   CSN: [id number] Arrival date & time: 02/05/16  1343     History   Chief Complaint Chief Complaint  Patient presents with  . flu like symptoms    body aches, sweats, nauseated, headache  . Urinary Tract Infection    frequency    HPI Amy [name] is a 40 y.o. female.  The history is provided by the patient. No language interpreter was used.  Urinary Tract Infection   This is a new problem. The problem occurs every urination. The problem has been gradually worsening. The pain is moderate. There has been no fever. She is not sexually active. She has tried nothing for the symptoms. Her past medical history does not include recurrent UTIs.    Past Medical History:  Diagnosis Date  . Anemia   . Hypertension     Patient Active Problem List   Diagnosis Date Noted  . Excessive or frequent menstruation 09/10/2012    Past Surgical History:  Procedure Laterality Date  . CHOLECYSTECTOMY  2006  . DILITATION & CURRETTAGE/HYSTROSCOPY WITH HYDROTHERMAL ABLATION N/A 07/10/2013   Procedure: DILATATION & CURETTAGE/HYSTEROSCOPY WITH HYDROTHERMAL ABLATION;  Surgeon: Brock Bad, MD;  Location: [facility];  Service: Gynecology;  Laterality: N/A;  . TUBAL LIGATION  1997  . WISDOM TOOTH EXTRACTION  1998    OB History    Gravida Para Term Preterm AB Living   2 2 2     2    SAB TAB Ectopic Multiple Live Births           2       Home Medications    Prior to Admission medications   Medication Sig Start Date End Date Taking? Authorizing Provider  hydrochlorothiazide (HYDRODIURIL) 12.5 MG tablet Take 1 tablet (12.5 mg total) by mouth daily. 06/17/15  Yes Eber Hong, MD  nitrofurantoin, macrocrystal-monohydrate, (MACROBID) 100 MG capsule Take 1 capsule (100 mg total) by mouth 2 (two) times daily. 08/26/15   Nelva Nay, MD    Family History Family History  Problem Relation Age of Onset  . COPD    . Hypertension    . Diabetes    .  Cancer      Social History Social History  Substance Use Topics  . Smoking status: Never Smoker  . Smokeless tobacco: Never Used  . Alcohol use No     Allergies   Review of patient's allergies indicates no known allergies.   Review of Systems Review of Systems  All other systems reviewed and are negative.    Physical Exam Updated Vital Signs BP (!) 148/108 (BP Location: Left Arm)   Pulse 74   Temp 98.5 F (36.9 C) (Oral)   Resp 18   Ht 5\' 2"  (1.575 m)   Wt 102.1 kg   SpO2 100%   BMI 41.15 kg/m   Physical Exam  Constitutional: She appears well-developed and well-nourished. No distress.  HENT:  Head: Normocephalic and atraumatic.  Eyes: Conjunctivae are normal.  Neck: Neck supple.  Cardiovascular: Normal rate and regular rhythm.   No murmur heard. Pulmonary/Chest: Effort normal and breath sounds normal. No respiratory distress.  Abdominal: Soft. There is no tenderness.  Musculoskeletal: She exhibits no edema.  Neurological: She is alert.  Skin: Skin is warm and dry.  Psychiatric: She has a normal mood and affect.  Nursing note and vitals reviewed.    ED Treatments / Results  Labs (all labs ordered are listed, but only abnormal results are displayed)  Labs Reviewed  URINALYSIS, ROUTINE W REFLEX MICROSCOPIC (NOT AT [facility]) - Abnormal; Notable for the following:       Result Value   APPearance CLOUDY (*)    Leukocytes, UA LARGE (*)    All other components within normal limits  URINE MICROSCOPIC-ADD ON - Abnormal; Notable for the following:    Squamous Epithelial / LPF 6-30 (*)    Bacteria, UA MANY (*)    All other components within normal limits    EKG  EKG Interpretation None       Radiology No results found.  Procedures Procedures (including critical care time)  Medications Ordered in ED Medications - No data to display   Initial Impression / Assessment and Plan / ED Course  I have reviewed the triage vital signs and the nursing  notes.  Pertinent labs & imaging results that were available during my care of the patient were reviewed by me and considered in my medical decision making (see chart for details).  Clinical Course   Pt has many bacteria 6-30 wbc's     Final Clinical Impressions(s) / ED Diagnoses   Final diagnoses:  UTI (lower urinary tract infection)    New Prescriptions New Prescriptions   No medications on file   Meds ordered this encounter  Medications  . cephALEXin (KEFLEX) 500 MG capsule    Sig: Take 1 capsule (500 mg total) by mouth 4 (four) times daily.    Dispense:  40 capsule    Refill:  0    Order Specific Question:   Supervising Provider    Answer:   Eber HongMILLER, BRIAN [[id number]]  An After Visit Summary was printed and given to the patient.   Lonia SkinnerLeslie K Lakewood ShoresSofia, PA-C 02/05/16 1556    Benjiman CoreNathan Pickering, MD 02/07/16 [phone]

## 2016-02-05 NOTE — ED Notes (Signed)
Pt given d/c instructions as per chart. Verbalizes understanding. No questions. Rx x 1 

## 2016-02-05 NOTE — ED Triage Notes (Signed)
Patient c/o chills, sore throat, body aches, nausea, and headaches. Patient also c/o urine frequency and would like to be checked for UTI.

## 2016-02-05 NOTE — ED Notes (Signed)
Langston MaskerKaren Sofia, PA-C in room with pt now.

## 2016-02-08 ENCOUNTER — Encounter (HOSPITAL_BASED_OUTPATIENT_CLINIC_OR_DEPARTMENT_OTHER): Payer: Self-pay

## 2016-02-08 ENCOUNTER — Emergency Department (HOSPITAL_BASED_OUTPATIENT_CLINIC_OR_DEPARTMENT_OTHER)
Admission: EM | Admit: 2016-02-08 | Discharge: 2016-02-08 | Disposition: A | Discharge status: Home / Self Care | Payer: Self-pay | Attending: Emergency Medicine | Admitting: Emergency Medicine

## 2016-02-08 ENCOUNTER — Emergency Department (HOSPITAL_BASED_OUTPATIENT_CLINIC_OR_DEPARTMENT_OTHER): Payer: Self-pay

## 2016-02-08 DIAGNOSIS — R05 Cough: Secondary | ICD-10-CM

## 2016-02-08 DIAGNOSIS — R0602 Shortness of breath: Secondary | ICD-10-CM | POA: Insufficient documentation

## 2016-02-08 DIAGNOSIS — I10 Essential (primary) hypertension: Secondary | ICD-10-CM | POA: Insufficient documentation

## 2016-02-08 DIAGNOSIS — Z79899 Other long term (current) drug therapy: Secondary | ICD-10-CM | POA: Insufficient documentation

## 2016-02-08 DIAGNOSIS — R059 Cough, unspecified: Secondary | ICD-10-CM

## 2016-02-08 DIAGNOSIS — J069 Acute upper respiratory infection, unspecified: Secondary | ICD-10-CM | POA: Insufficient documentation

## 2016-02-08 LAB — URINE MICROSCOPIC-ADD ON

## 2016-02-08 LAB — URINALYSIS, ROUTINE W REFLEX MICROSCOPIC
Bilirubin Urine: NEGATIVE
GLUCOSE, UA: NEGATIVE mg/dL
HGB URINE DIPSTICK: NEGATIVE
KETONES UR: NEGATIVE mg/dL
Nitrite: NEGATIVE
PROTEIN: NEGATIVE mg/dL
Specific Gravity, Urine: 1.02 (ref 1.005–1.030)
pH: 5.5 (ref 5.0–8.0)

## 2016-02-08 MED ORDER — METRONIDAZOLE 500 MG PO TABS
2000.0000 mg | ORAL_TABLET | Freq: Once | ORAL | Status: AC
  Administered 2016-02-08: 2000 mg via ORAL
  Filled 2016-02-08: qty 4

## 2016-02-08 MED ORDER — BENZONATATE 100 MG PO CAPS: 100.0000 mg | ORAL_CAPSULE | Freq: Three times a day (TID) | ORAL | 0 refills | Status: DC

## 2016-02-08 MED ORDER — ALBUTEROL SULFATE HFA 108 (90 BASE) MCG/ACT IN AERS
2.0000 | INHALATION_SPRAY | Freq: Once | RESPIRATORY_TRACT | Status: AC
  Administered 2016-02-08: 2 via RESPIRATORY_TRACT
  Filled 2016-02-08: qty 6.7

## 2016-02-08 MED ORDER — BENZONATATE 100 MG PO CAPS
100.0000 mg | ORAL_CAPSULE | Freq: Once | ORAL | Status: AC
  Administered 2016-02-08: 100 mg via ORAL
  Filled 2016-02-08: qty 1

## 2016-02-08 MED ORDER — IBUPROFEN 800 MG PO TABS
800.0000 mg | ORAL_TABLET | Freq: Once | ORAL | Status: AC
Start: 2016-02-08 — End: 2016-02-08
  Administered 2016-02-08: 800 mg via ORAL
  Filled 2016-02-08: qty 1

## 2016-02-08 MED ORDER — ONDANSETRON 8 MG PO TBDP
8.0000 mg | ORAL_TABLET | Freq: Once | ORAL | Status: AC
  Administered 2016-02-08: 8 mg via ORAL
  Filled 2016-02-08: qty 1

## 2016-02-08 NOTE — Discharge Instructions (Signed)
Take tessalon as needed for cough. Use the albuterol inhaler as needed. Take your blood pressure medicine when you get home. Follow up with primary care doctor if symptoms worsen. Please return to ED if you develop chest pain or worsening symptoms.

## 2016-02-08 NOTE — ED Provider Notes (Signed)
[facility]   CSN: [id number] Arrival date & time: 02/08/16  1120     History   Chief Complaint Chief Complaint  Patient presents with  . Cough    HPI Amy [name] is a 40 y.o. female.  40 year old African-American female is past medical history significant for hypertension and bronchitis presents to the ED today with cough and shortness of breath. Patient states the symptoms started approximate 4 days ago. Patient was seen in ED and diagnosed with UTI. She was given Keflex. She is currently taking Keflex. Patient states that her nonproductive cough and shortness breath has gradually worsened. Patient has tried over-the-counter therapy with little relief. Nothing makes better or worse. Patient endorses mild headache, rhinorrhea, sore throat, sinus congestion, chest congestion. She denies any fever, chills, weakness, muscle aches, chest pain, abdominal pain, nausea, emesis, urinary symptoms, change in bowel habits. SOB is not associated with exertion. She denies any lower extremity edema. Patient states that she is not sexually active. On review of UAs from previous visit Trichomonas was present. Patient was not treated for Trichomonas. This appears to be an unrelated event. Patient denies any urinary symptoms, vaginal discharge, vaginal bleeding.      Past Medical History:  Diagnosis Date  . Anemia   . Hypertension     Patient Active Problem List   Diagnosis Date Noted  . Excessive or frequent menstruation 09/10/2012    Past Surgical History:  Procedure Laterality Date  . CHOLECYSTECTOMY  2006  . DILITATION & CURRETTAGE/HYSTROSCOPY WITH HYDROTHERMAL ABLATION N/A 07/10/2013   Procedure: DILATATION & CURETTAGE/HYSTEROSCOPY WITH HYDROTHERMAL ABLATION;  Surgeon: Brock Badharles A Harper, MD;  Location: [facility];  Service: Gynecology;  Laterality: N/A;  . TUBAL LIGATION  1997  . WISDOM TOOTH EXTRACTION  1998    OB History    Gravida Para Term Preterm  AB Living   2 2 2     2    SAB TAB Ectopic Multiple Live Births           2       Home Medications    Prior to Admission medications   Medication Sig Start Date End Date Taking? Authorizing Provider  cephALEXin (KEFLEX) 500 MG capsule Take 1 capsule (500 mg total) by mouth 4 (four) times daily. 02/05/16   Elson AreasLeslie K Sofia, PA-C  hydrochlorothiazide (HYDRODIURIL) 12.5 MG tablet Take 1 tablet (12.5 mg total) by mouth daily. 06/17/15   Eber HongBrian Miller, MD    Family History Family History  Problem Relation Age of Onset  . COPD    . Hypertension    . Diabetes    . Cancer      Social History Social History  Substance Use Topics  . Smoking status: Never Smoker  . Smokeless tobacco: Never Used  . Alcohol use No     Allergies   Review of patient's allergies indicates no known allergies.   Review of Systems Review of Systems  Constitutional: Negative for chills, fatigue and fever.  HENT: Positive for congestion, postnasal drip, rhinorrhea, sinus pressure and sore throat. Negative for ear pain.   Eyes: Negative for pain and visual disturbance.  Respiratory: Positive for cough and shortness of breath. Negative for wheezing.   Cardiovascular: Negative for chest pain, palpitations and leg swelling.  Gastrointestinal: Negative for abdominal pain, diarrhea, nausea and vomiting.  Genitourinary: Negative for dysuria, flank pain, frequency, hematuria and urgency.  Musculoskeletal: Negative for arthralgias and back pain.  Skin: Negative for color change and rash.  Neurological: Positive for headaches. Negative for dizziness, syncope, weakness and light-headedness.  All other systems reviewed and are negative.    Physical Exam Updated Vital Signs BP (!) 162/112 (BP Location: Left Arm)   Pulse 80   Temp 98.3 F (36.8 C) (Oral)   Resp 16   Ht 5\' 2"  (1.575 m)   Wt 102.1 kg   SpO2 100%   BMI 41.15 kg/m   Physical Exam  Constitutional: She appears well-developed and well-nourished.  No distress.  HENT:  Head: Normocephalic and atraumatic.  Right Ear: External ear normal.  Left Ear: External ear normal.  Nose: Nose normal.  Mouth/Throat: Mucous membranes are normal. Posterior oropharyngeal erythema present. No oropharyngeal exudate or posterior oropharyngeal edema.  Eyes: Conjunctivae are normal. Pupils are equal, round, and reactive to light. Right eye exhibits no discharge. Left eye exhibits no discharge. No scleral icterus.  Neck: Normal range of motion. Neck supple. No thyromegaly present.  Cardiovascular: Normal rate, regular rhythm, normal heart sounds and intact distal pulses.   Pulses:      Radial pulses are 2+ on the right side, and 2+ on the left side.       Dorsalis pedis pulses are 2+ on the right side, and 2+ on the left side.  Pulmonary/Chest: Effort normal and breath sounds normal. No respiratory distress. She has no wheezes. She has no rales. She exhibits no tenderness.  Abdominal: Soft. Bowel sounds are normal. She exhibits no distension. There is no tenderness. There is no rebound and no guarding.  Musculoskeletal: Normal range of motion.  No lower extremity edema noted.  Lymphadenopathy:    She has no cervical adenopathy.  Neurological: She is alert.  Skin: Skin is warm and dry. Capillary refill takes less than 2 seconds. No pallor.  Nursing note and vitals reviewed.    ED Treatments / Results  Labs (all labs ordered are listed, but only abnormal results are displayed) Labs Reviewed  URINALYSIS, ROUTINE W REFLEX MICROSCOPIC (NOT AT [facility]) - Abnormal; Notable for the following:       Result Value   Leukocytes, UA MODERATE (*)    All other components within normal limits  URINE MICROSCOPIC-ADD ON - Abnormal; Notable for the following:    Squamous Epithelial / LPF 0-5 (*)    Bacteria, UA FEW (*)    All other components within normal limits  URINE CULTURE    EKG  EKG Interpretation None       Radiology Dg Chest 2 View  Result Date:  02/08/2016 CLINICAL DATA:  Shortness of breath for 2-3 days.  Cough. EXAM: CHEST  2 VIEW COMPARISON:  10/17/2012 FINDINGS: Heart is borderline in size. No confluent airspace opacities or effusions. No edema. No acute bony abnormality. IMPRESSION: Borderline heart size.  No active disease. Electronically Signed   By: Charlett Nose M.D.   On: 02/08/2016 12:12    Procedures Procedures (including critical care time)  Medications Ordered in ED Medications  benzonatate (TESSALON) capsule 100 mg (100 mg Oral Given 02/08/16 1231)  ibuprofen (ADVIL,MOTRIN) tablet 800 mg (800 mg Oral Given 02/08/16 1231)  metroNIDAZOLE (FLAGYL) tablet 2,000 mg (2,000 mg Oral Given 02/08/16 1307)  ondansetron (ZOFRAN-ODT) disintegrating tablet 8 mg (8 mg Oral Given 02/08/16 1307)  albuterol (PROVENTIL HFA;VENTOLIN HFA) 108 (90 Base) MCG/ACT inhaler 2 puff (2 puffs Inhalation Given 02/08/16 1318)     Initial Impression / Assessment and Plan / ED Course  I have reviewed the triage vital signs and the nursing notes.  Pertinent labs & imaging results that were available during my care of the patient were reviewed by me and considered in my medical decision making (see chart for details).  Clinical Course  Patient presented to the ED today with worsening URI symptoms. Patient was seen 4 days ago in ED for same. Patient is given symptomatic treatment. She was diagnosed with UTI and started on Keflex. On review of prior UA Trichomonas was present. Patient denies any sexual intercourse, urinary symptoms, vaginal symptoms. UA today shows leukocytes and bacteria. Urine was cultured. Patient encouraged to continue her Keflex. Will call with results. Patient was given Flagyl for Trichomonas treatment in ED. Tessalon given with relief of cough. Chest x-ray was unremarkable. Borderline heart size. Patient does have hypertension. Patient blood pressure elevated in ED. States she did not take her blood pressure medicine this morning.  Encouraged to follow up with primary care for control of blood pressure and follow-up. PCP referral given. Albuterol inhaler given in ED with improved breathing. Patient with history of bronchitis. Pt CXR negative for acute infiltrate. Patients symptoms are consistent with URI, likely viral etiology. Discussed that antibiotics are not indicated for viral infections. Pt will be discharged with symptomatic treatment.  Verbalizes understanding and is agreeable with plan. Pt is hemodynamically stable & in NAD prior to dc. She was seen and evaluated by Dr. Ramond Craver who agrees with plan.   Final Clinical Impressions(s) / ED Diagnoses   Final diagnoses:  Cough  URI (upper respiratory infection)  SOB (shortness of breath)    New Prescriptions Discharge Medication List as of 02/08/2016  1:43 PM    START taking these medications   Details  benzonatate (TESSALON) 100 MG capsule Take 1 capsule (100 mg total) by mouth every 8 (eight) hours., Starting Wed 02/08/2016, Print         Rise Mu, PA-C 02/08/16 1530    Pricilla Loveless, MD 02/11/16 [phone]

## 2016-02-08 NOTE — ED Notes (Signed)
MD at bedside. 

## 2016-02-08 NOTE — ED Triage Notes (Signed)
C/o cough, runny nose, SOB x 4 days-NAD-steady gait

## 2016-02-09 LAB — URINE CULTURE: Culture: NO GROWTH

## 2016-02-10 IMAGING — CT CT ABD-PELV W/O CM
3 of 4 series · 16 of 46 positions shown, 20 images · non-contrast
Comparison: None.

CLINICAL DATA: Left flank pain.

EXAM:
CT ABDOMEN AND PELVIS WITHOUT CONTRAST
TECHNIQUE: Multidetector CT imaging of the abdomen and pelvis was performed
following the standard protocol without IV contrast.

[Series 2: abd/ pelvis 5.0 i30f 1 · axial · 0.97mm/px · z∈[-994,-614]mm · 12 of 88 slices shown, 16 images]
[im 8/88  soft-tissue]
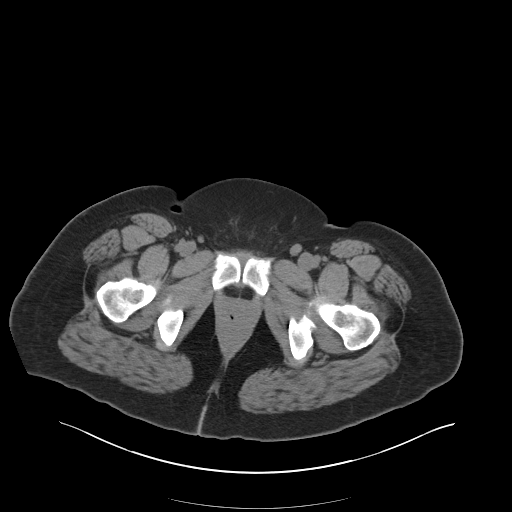
[im 8/88  bone]
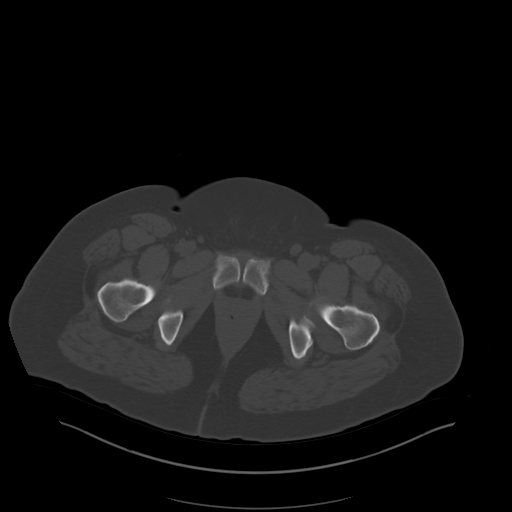
[im 15/88  soft-tissue]
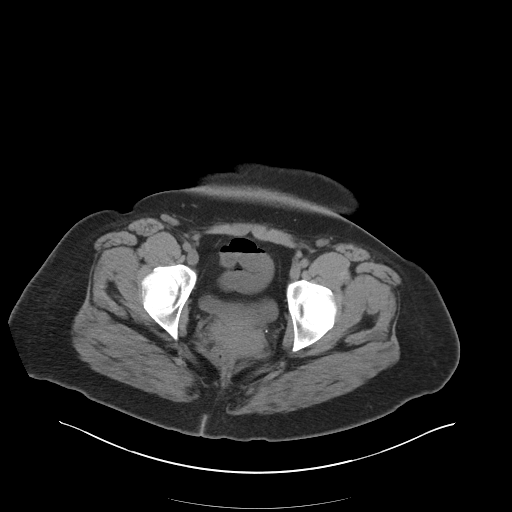
[im 22/88  soft-tissue]
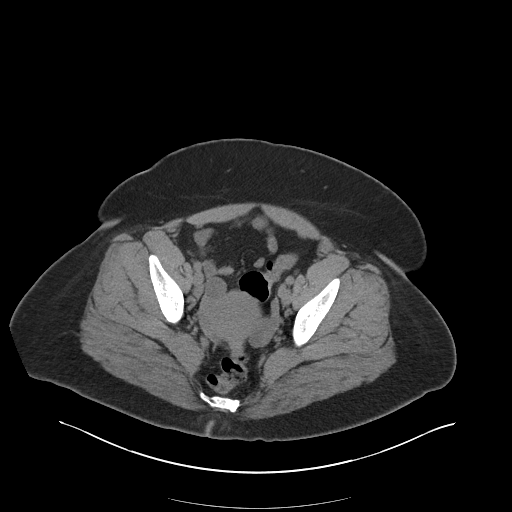
[im 33/88  soft-tissue]
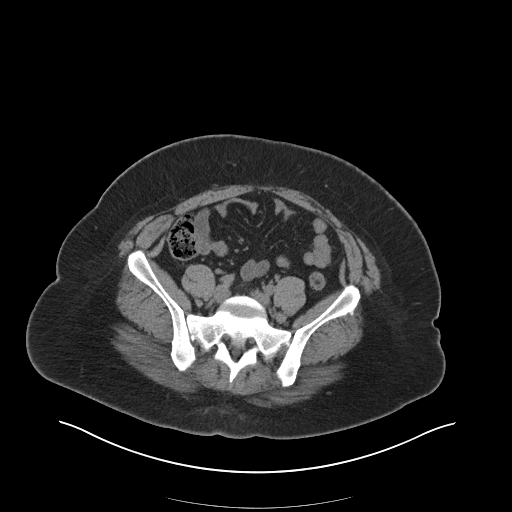
[im 40/88  soft-tissue]
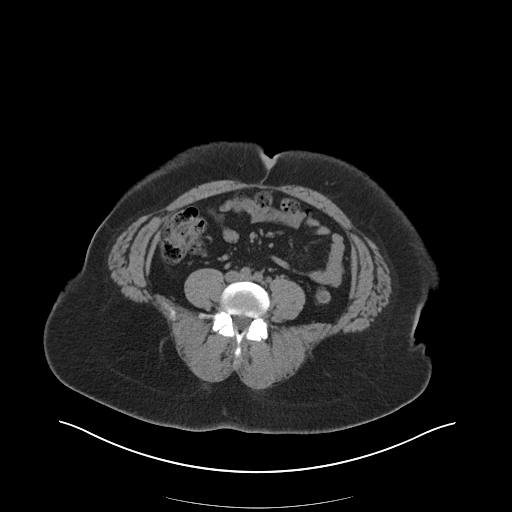
[im 48/88  soft-tissue]
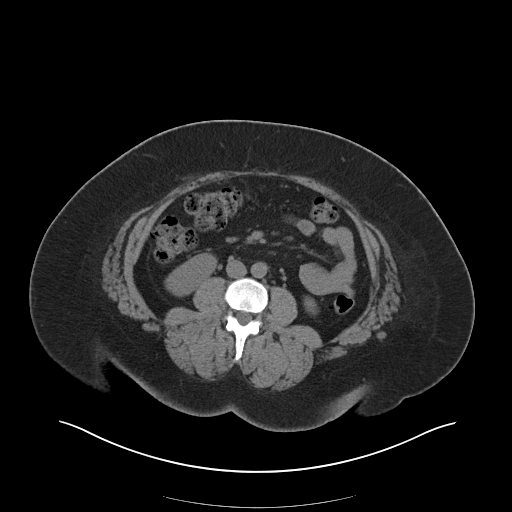
[im 55/88  soft-tissue]
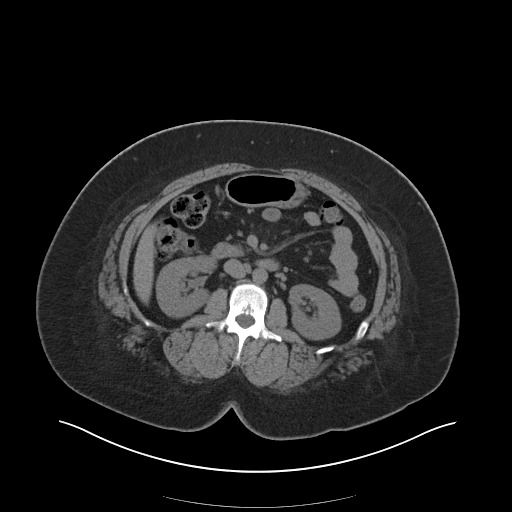
[im 66/88  soft-tissue]
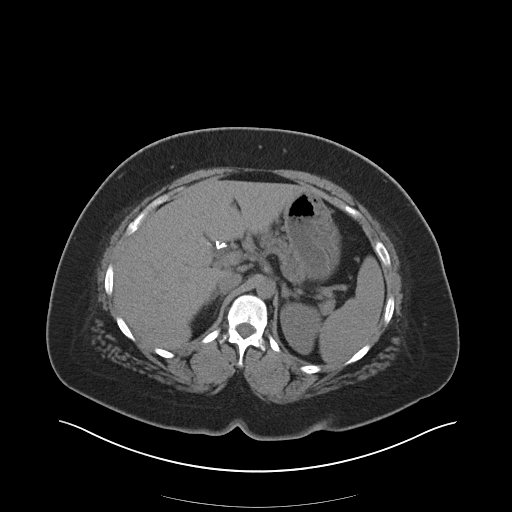
[im 73/88  soft-tissue]
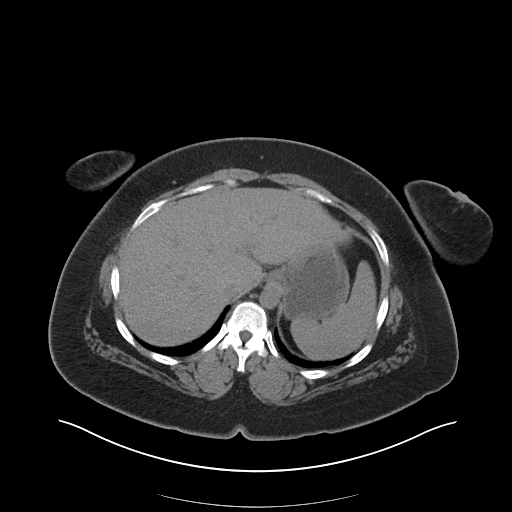
[im 73/88  lung]
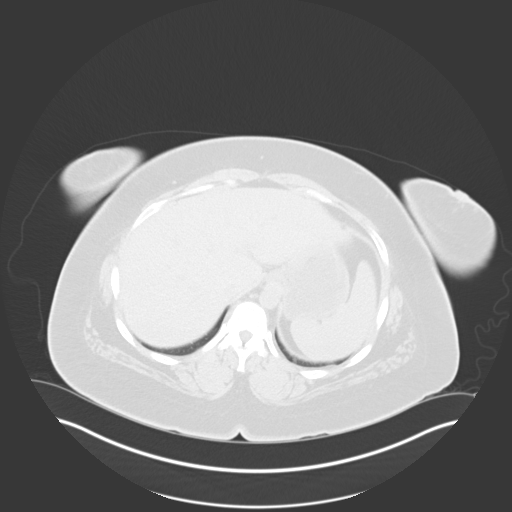
[im 73/88  bone]
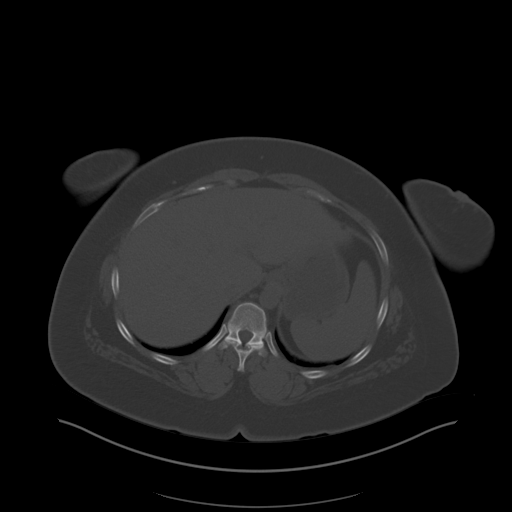
[im 77/88  lung]
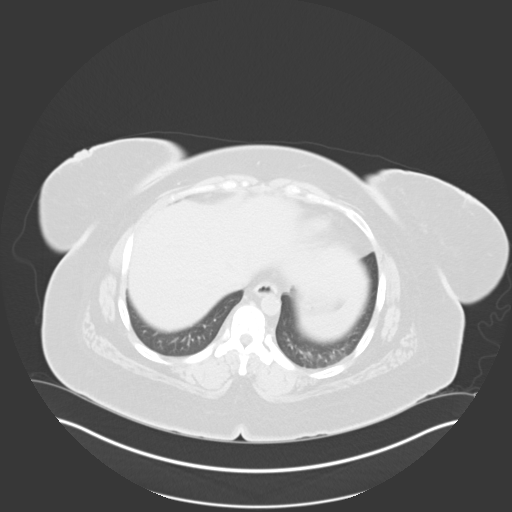
[im 80/88  soft-tissue]
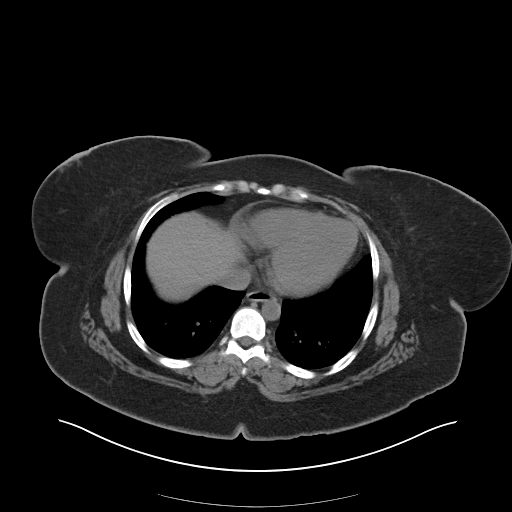
[im 80/88  lung]
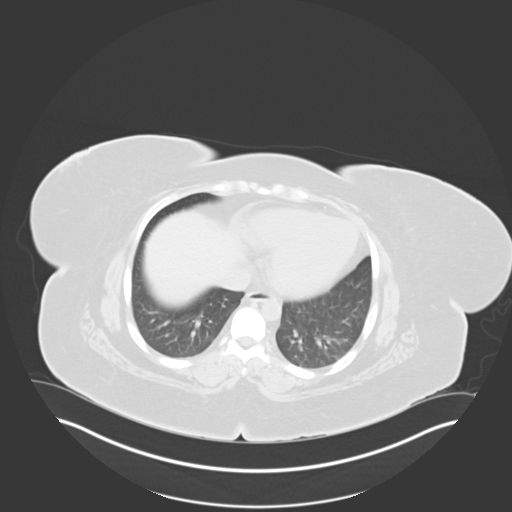
[im 84/88  lung]
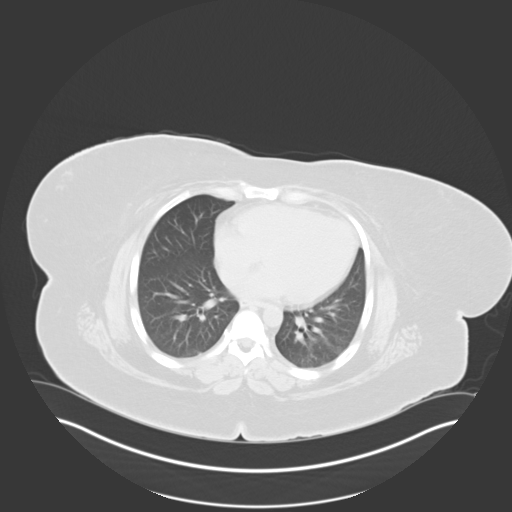

[Series 5: coronals · coronal · 0.88mm/px · 3 of 118 slices shown]
[im 40/118  soft-tissue]
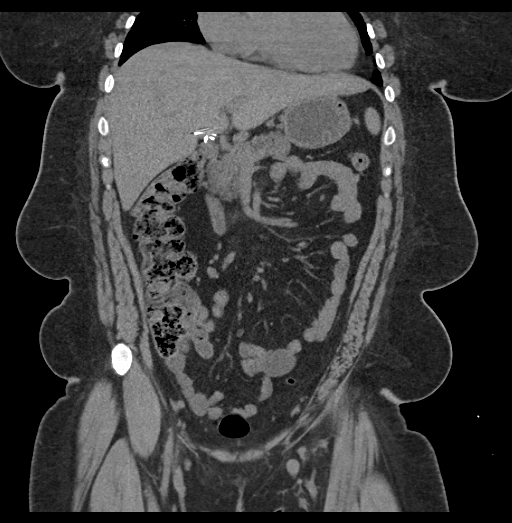
[im 53/118  soft-tissue]
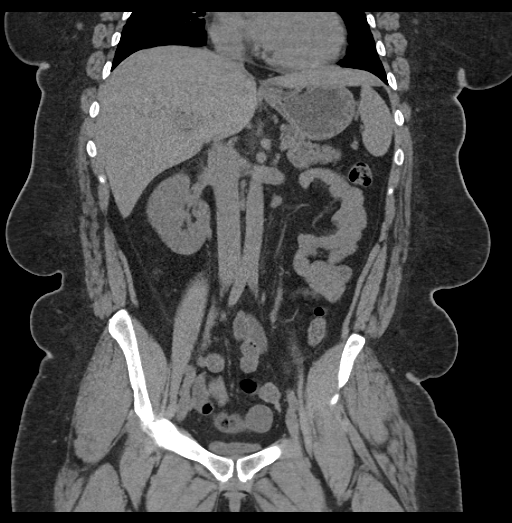
[im 66/118  soft-tissue]
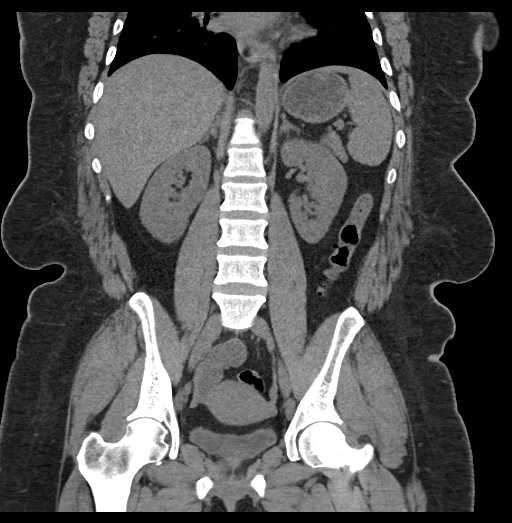

[Series 6: sagittals · sagittal · 0.87mm/px · 1 of 149 slices shown]
[im 50/149  soft-tissue]
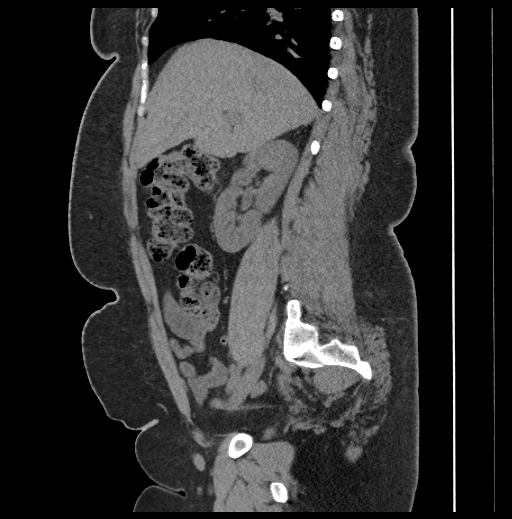

[16 of 46 positions shown; findings below may reference images not displayed]

FINDINGS: The gallbladder has been removed. Liver, biliary tree, spleen,
pancreas, adrenal glands, and kidneys are normal. The bowel is
normal including the terminal ileum and appendix. No free air or
free fluid. 2.5 cm fibroid on the right side of the uterine fundus.
Ovaries are normal. No osseous abnormality.
IMPRESSION: No acute abnormality.  Uterine fibroid.

## 2016-09-08 DIAGNOSIS — I10 Essential (primary) hypertension: Secondary | ICD-10-CM | POA: Insufficient documentation

## 2016-09-08 DIAGNOSIS — A599 Trichomoniasis, unspecified: Secondary | ICD-10-CM | POA: Insufficient documentation

## 2016-09-08 DIAGNOSIS — Z79899 Other long term (current) drug therapy: Secondary | ICD-10-CM | POA: Insufficient documentation

## 2016-09-08 DIAGNOSIS — N12 Tubulo-interstitial nephritis, not specified as acute or chronic: Secondary | ICD-10-CM | POA: Insufficient documentation

## 2016-09-09 ENCOUNTER — Emergency Department (HOSPITAL_BASED_OUTPATIENT_CLINIC_OR_DEPARTMENT_OTHER)
Admission: EM | Admit: 2016-09-09 | Discharge: 2016-09-09 | Disposition: A | Discharge status: Home / Self Care | Payer: Self-pay | Attending: Emergency Medicine | Admitting: Emergency Medicine

## 2016-09-09 ENCOUNTER — Encounter (HOSPITAL_BASED_OUTPATIENT_CLINIC_OR_DEPARTMENT_OTHER): Payer: Self-pay | Admitting: Emergency Medicine

## 2016-09-09 DIAGNOSIS — N12 Tubulo-interstitial nephritis, not specified as acute or chronic: Secondary | ICD-10-CM

## 2016-09-09 DIAGNOSIS — A599 Trichomoniasis, unspecified: Secondary | ICD-10-CM

## 2016-09-09 LAB — URINALYSIS, MICROSCOPIC (REFLEX)

## 2016-09-09 LAB — URINALYSIS, ROUTINE W REFLEX MICROSCOPIC
BILIRUBIN URINE: NEGATIVE
Glucose, UA: NEGATIVE mg/dL
Hgb urine dipstick: NEGATIVE
Ketones, ur: NEGATIVE mg/dL
NITRITE: NEGATIVE
PROTEIN: NEGATIVE mg/dL
SPECIFIC GRAVITY, URINE: 1.029 (ref 1.005–1.030)
pH: 5.5 (ref 5.0–8.0)

## 2016-09-09 LAB — PREGNANCY, URINE: PREG TEST UR: NEGATIVE

## 2016-09-09 MED ORDER — AZITHROMYCIN 250 MG PO TABS
1000.0000 mg | ORAL_TABLET | Freq: Once | ORAL | Status: AC
  Administered 2016-09-09: 1000 mg via ORAL
  Filled 2016-09-09: qty 4

## 2016-09-09 MED ORDER — CEPHALEXIN 500 MG PO CAPS: 500.0000 mg | ORAL_CAPSULE | Freq: Two times a day (BID) | ORAL | 0 refills | Status: DC

## 2016-09-09 MED ORDER — LIDOCAINE HCL (PF) 1 % IJ SOLN
INTRAMUSCULAR | Status: AC
  Administered 2016-09-09: 0.9 mL
  Filled 2016-09-09: qty 5

## 2016-09-09 MED ORDER — CEFTRIAXONE SODIUM 250 MG IJ SOLR
250.0000 mg | Freq: Once | INTRAMUSCULAR | Status: AC
  Administered 2016-09-09: 250 mg via INTRAMUSCULAR
  Filled 2016-09-09: qty 250

## 2016-09-09 MED ORDER — METRONIDAZOLE 500 MG PO TABS: 500.0000 mg | ORAL_TABLET | Freq: Two times a day (BID) | ORAL | 0 refills | Status: DC

## 2016-09-09 NOTE — ED Triage Notes (Signed)
PT presents to ED with c/o left flank and llq pain for 3 days.

## 2016-09-09 NOTE — Discharge Instructions (Signed)
Take the antibiotics prescribed. No intercourse until after the antibiotics. Have your partners checked for STDs as well.

## 2016-09-09 NOTE — ED Provider Notes (Signed)
[facility] Provider Note   CSN: [id number] Arrival date & time: 09/08/16  2359     History   Chief Complaint Chief Complaint  Patient presents with  . Back Pain    HPI Amy [name] is a 41 y.o. female.  HPI Pt comes in with cc of R sided lateral and flank pain x 3 days. Pt described the pain as nagging, constant pain. Pt has associated chills and has subjective fevers. No nausea, emesis. Pt has increased urge to urinate, but no dysuria, hematuria.  Pt has hx of ovarian cyst, no other pelvic disorders. Pt has no vaginal discharge, vaginal bleeding and denies any risk for STDs (no sex in months).   Past Medical History:  Diagnosis Date  . Anemia   . Hypertension     Patient Active Problem List   Diagnosis Date Noted  . Excessive or frequent menstruation 09/10/2012    Past Surgical History:  Procedure Laterality Date  . CHOLECYSTECTOMY  2006  . DILITATION & CURRETTAGE/HYSTROSCOPY WITH HYDROTHERMAL ABLATION N/A 07/10/2013   Procedure: DILATATION & CURETTAGE/HYSTEROSCOPY WITH HYDROTHERMAL ABLATION;  Surgeon: Brock Bad, MD;  Location: [facility];  Service: Gynecology;  Laterality: N/A;  . TUBAL LIGATION  1997  . WISDOM TOOTH EXTRACTION  1998    OB History    Gravida Para Term Preterm AB Living   SAB TAB Ectopic Multiple Live Births           2       Home Medications    Prior to Admission medications   Medication Sig Start Date End Date Taking? Authorizing Provider  benzonatate (TESSALON) 100 MG capsule Take 1 capsule (100 mg total) by mouth every 8 (eight) hours. 02/08/16   Rise Mu, PA-C  cephALEXin (KEFLEX) 500 MG capsule Take 1 capsule (500 mg total) by mouth 2 (two) times daily. 09/09/16   Derwood Kaplan, MD  hydrochlorothiazide (HYDRODIURIL) 12.5 MG tablet Take 1 tablet (12.5 mg total) by mouth daily. 06/17/15   Eber Hong, MD  metroNIDAZOLE (FLAGYL) 500 MG tablet Take 1 tablet (500 mg total) by mouth 2 (two)  times daily. 09/09/16   Derwood Kaplan, MD    Family History Family History  Problem Relation Age of Onset  . COPD    . Hypertension    . Diabetes    . Cancer      Social History Social History  Substance Use Topics  . Smoking status: Never Smoker  . Smokeless tobacco: Never Used  . Alcohol use No     Allergies   Patient has no known allergies.   Review of Systems Review of Systems  Constitutional: Positive for activity change.  Genitourinary: Positive for flank pain and frequency. Negative for dysuria.  All other systems reviewed and are negative.    Physical Exam Updated Vital Signs BP 140/90 (BP Location: Right Arm)   Pulse 87   Temp 98.5 F (36.9 C) (Oral)   Resp 18   Ht  (1.575 m)   Wt 225 lb (102.1 kg)   SpO2 99%   BMI 41.15 kg/m   Physical Exam  Constitutional: She is oriented to person, place, and time. She appears well-developed and well-nourished.  HENT:  Head: Normocephalic and atraumatic.  Eyes: EOM are normal. Pupils are equal, round, and reactive to light.  Neck: Neck supple.  Cardiovascular: Normal rate, regular rhythm and normal heart sounds.   No murmur heard.  Pulmonary/Chest: Effort normal. No respiratory distress.  Abdominal: Soft. She exhibits no distension. There is tenderness. There is no rebound and no guarding.  LLQ and L flank tenderness  Genitourinary:  Genitourinary Comments: Pt declined  Neurological: She is alert and oriented to person, place, and time.  Skin: Skin is warm and dry.  Nursing note and vitals reviewed.    ED Treatments / Results  Labs (all labs ordered are listed, but only abnormal results are displayed) Labs Reviewed  URINALYSIS, ROUTINE W REFLEX MICROSCOPIC - Abnormal; Notable for the following:       Result Value   Leukocytes, UA MODERATE (*)    All other components within normal limits  URINALYSIS, MICROSCOPIC (REFLEX) - Abnormal; Notable for the following:    Bacteria, UA MANY (*)     Squamous Epithelial / LPF 0-5 (*)    All other components within normal limits  PREGNANCY, URINE    EKG  EKG Interpretation None       Radiology No results found.  Procedures Procedures (including critical care time)  Medications Ordered in ED Medications  cefTRIAXone (ROCEPHIN) injection 250 mg (not administered)  azithromycin (ZITHROMAX) tablet 1,000 mg (not administered)     Initial Impression / Assessment and Plan / ED Course  I have reviewed the triage vital signs and the nursing notes.  Pertinent labs & imaging results that were available during my care of the patient were reviewed by me and considered in my medical decision making (see chart for details).     Pt comes in with L sided abd pain and flank pain. Pt is having some urinary urgency. UA has ++ WBC and also has trichmonas. Pt reported to me that she has not had intercourse in more than 1.5 years. She has no vaginal discharge and has declined a pelvic exam. She is open to getting covered for GC and chlamydia, so we will give her those meds here. Pt is not appearing toxic and there is no peritoneal signs, or severe systemic signs- so although I explained the rationale to do a pelvic exam and pt understands that the workup will be incomplete and there will be missed infection possibly - I think her decision is reasonable.  Strict ER return precautions have been discussed, and patient is agreeing with the plan and is comfortable with the workup done and the recommendations from the ER.  Pt will go home with keflex for Pyelonephritis and flagyl to cover trichomonas. Final Clinical Impressions(s) / ED Diagnoses   Final diagnoses:  Pyelonephritis  Trichomoniasis    New Prescriptions New Prescriptions   CEPHALEXIN (KEFLEX) 500 MG CAPSULE    Take 1 capsule (500 mg total) by mouth 2 (two) times daily.   METRONIDAZOLE (FLAGYL) 500 MG TABLET    Take 1 tablet (500 mg total) by mouth 2 (two) times daily.     Derwood Kaplan, MD 09/09/16 [phone]

## 2016-09-09 NOTE — ED Notes (Signed)
Pt given d/c instructions as per chart. Rx x 2. Verbalizes understanding,. No questions.

## 2016-09-09 NOTE — ED Notes (Signed)
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 Pt denies specific injury prior to back pain starting.

## 2017-02-02 ENCOUNTER — Emergency Department (HOSPITAL_BASED_OUTPATIENT_CLINIC_OR_DEPARTMENT_OTHER): Payer: Self-pay

## 2017-02-02 ENCOUNTER — Encounter (HOSPITAL_BASED_OUTPATIENT_CLINIC_OR_DEPARTMENT_OTHER): Payer: Self-pay | Admitting: Emergency Medicine

## 2017-02-02 ENCOUNTER — Emergency Department (HOSPITAL_BASED_OUTPATIENT_CLINIC_OR_DEPARTMENT_OTHER)
Admission: EM | Admit: 2017-02-02 | Discharge: 2017-02-02 | Disposition: A | Discharge status: Home / Self Care | Payer: Self-pay | Attending: Physician Assistant | Admitting: Physician Assistant

## 2017-02-02 DIAGNOSIS — R059 Cough, unspecified: Secondary | ICD-10-CM

## 2017-02-02 DIAGNOSIS — R05 Cough: Secondary | ICD-10-CM | POA: Insufficient documentation

## 2017-02-02 DIAGNOSIS — J040 Acute laryngitis: Secondary | ICD-10-CM | POA: Insufficient documentation

## 2017-02-02 DIAGNOSIS — I10 Essential (primary) hypertension: Secondary | ICD-10-CM | POA: Insufficient documentation

## 2017-02-02 DIAGNOSIS — Z79899 Other long term (current) drug therapy: Secondary | ICD-10-CM | POA: Insufficient documentation

## 2017-02-02 LAB — RAPID STREP SCREEN (MED CTR MEBANE ONLY): Streptococcus, Group A Screen (Direct): NEGATIVE

## 2017-02-02 MED ORDER — NAPROXEN 500 MG PO TABS
500.0000 mg | ORAL_TABLET | Freq: Two times a day (BID) | ORAL | 0 refills | Status: DC
Start: 2017-02-02 — End: 2017-05-07

## 2017-02-02 MED ORDER — BENZONATATE 100 MG PO CAPS: 100.0000 mg | ORAL_CAPSULE | Freq: Three times a day (TID) | ORAL | 0 refills | Status: DC

## 2017-02-02 NOTE — Discharge Instructions (Signed)
Please read and follow all provided instructions.  Your diagnoses today include:  1. Laryngitis   2. Cough     You appear to have an upper respiratory infection (URI). An upper respiratory tract infection, or cold, is a viral infection of the air passages leading to the lungs. It should improve gradually after 5-7 days. You may have a lingering cough that lasts for 2- 4 weeks after the infection.  Tests performed today include:  Vital signs. See below for your results today.   Chest x-ray - no signs of pneumonia, your heart looks a little big, you should have this checked by your doctor  Medications prescribed:   Tessalon Perles - cough suppressant medication   Naproxen - anti-inflammatory pain medication  Do not exceed  naproxen every 12 hours, take with food  You have been prescribed an anti-inflammatory medication or NSAID. Take with food. Take smallest effective dose for the shortest duration needed for your pain. Stop taking if you experience stomach pain or vomiting.   Take any prescribed medications only as directed. Treatment for your infection is aimed at treating the symptoms. There are no medications, such as antibiotics, that will cure your infection.   Home care instructions:  Follow any educational materials contained in this packet.   Your illness is contagious and can be spread to others, especially during the first 3 or 4 days. It cannot be cured by antibiotics or other medicines. Take basic precautions such as washing your hands often, covering your mouth when you cough or sneeze, and avoiding public places where you could spread your illness to others.   Please continue drinking plenty of fluids.  Use over-the-counter medicines as needed as directed on packaging for symptom relief.  You may also use ibuprofen or tylenol as directed on packaging for pain or fever.  Do not take multiple medicines containing Tylenol or acetaminophen to avoid taking too much of  this medication.  Follow-up instructions: Please follow-up with your primary care provider in the next 3 days for further evaluation of your symptoms if you are not feeling better.   Return instructions:   Please return to the Emergency Department if you experience worsening symptoms.   RETURN IMMEDIATELY IF you develop shortness of breath, confusion or altered mental status, a new rash, become dizzy, faint, or poorly responsive, or are unable to be cared for at home.  Please return if you have persistent vomiting and cannot keep down fluids or develop a fever that is not controlled by tylenol or motrin.    Please return if you have any other emergent concerns.  Additional Information:  Your vital signs today were: BP (!) 165/109 (BP Location: Left Arm)    Pulse 90    Temp 99 F (37.2 C) (Oral)    Resp 20    Ht  (1.575 m)    Wt 108 kg (238 lb 1 oz)    SpO2 98%    BMI 43.54 kg/m  If your blood pressure (BP) was elevated above 135/85 this visit, please have this repeated by your doctor within one month. --------------

## 2017-02-02 NOTE — ED Triage Notes (Signed)
URI symptoms started Wednesday. Voice is hoarse, speaking in full sentences, NAD.

## 2017-02-02 NOTE — ED Provider Notes (Signed)
[facility]   CSN: [id number] Arrival date & time: 02/02/17  1308     History   Chief Complaint Chief Complaint  Patient presents with  . URI    HPI Amy [name] is a 41 y.o. female.  Patient presents with complaint of voice loss, cough, throat irritation 3 days. Patient has been using throat lozenges, OTC meds, hot tea without relief. No known sick contacts. No fever, ear pain, nasal congestion. Patient has pain in her right upper back that is worse with palpation, movement, and coughing. No hemoptysis. Patient denies risk factors for pulmonary embolism including: unilateral leg swelling, history of DVT/PE/other blood clots, use of exogenous hormones, recent immobilizations, recent surgery, recent travel (>4hr segment), malignancy, hemoptysis.        Past Medical History:  Diagnosis Date  . Anemia   . Hypertension     Patient Active Problem List   Diagnosis Date Noted  . Excessive or frequent menstruation 09/10/2012    Past Surgical History:  Procedure Laterality Date  . CHOLECYSTECTOMY  2006  . DILITATION & CURRETTAGE/HYSTROSCOPY WITH HYDROTHERMAL ABLATION N/A 07/10/2013   Procedure: DILATATION & CURETTAGE/HYSTEROSCOPY WITH HYDROTHERMAL ABLATION;  Surgeon: Brock Bad, MD;  Location: [facility];  Service: Gynecology;  Laterality: N/A;  . TUBAL LIGATION  1997  . WISDOM TOOTH EXTRACTION  1998    OB History    Gravida Para Term Preterm AB Living   SAB TAB Ectopic Multiple Live Births           2       Home Medications    Prior to Admission medications   Medication Sig Start Date End Date Taking? Authorizing Provider  benzonatate (TESSALON) 100 MG capsule Take 1 capsule (100 mg total) by mouth every 8 (eight) hours. 02/02/17   Renne Crigler, PA-C  cephALEXin (KEFLEX) 500 MG capsule Take 1 capsule (500 mg total) by mouth 2 (two) times daily. 09/09/16   Derwood Kaplan, MD  hydrochlorothiazide (HYDRODIURIL) 12.5  MG tablet Take 1 tablet (12.5 mg total) by mouth daily. 06/17/15   Eber Hong, MD  metroNIDAZOLE (FLAGYL) 500 MG tablet Take 1 tablet (500 mg total) by mouth 2 (two) times daily. 09/09/16   Derwood Kaplan, MD  naproxen (NAPROSYN) 500 MG tablet Take 1 tablet (500 mg total) by mouth 2 (two) times daily. 02/02/17   Renne Crigler, PA-C    Family History Family History  Problem Relation Age of Onset  . COPD Unknown   . Hypertension Unknown   . Diabetes Unknown   . Cancer Unknown     Social History Social History  Substance Use Topics  . Smoking status: Never Smoker  . Smokeless tobacco: Never Used  . Alcohol use No     Allergies   Patient has no known allergies.   Review of Systems Review of Systems  Constitutional: Negative for chills, fatigue and fever.  HENT: Positive for sore throat and voice change. Negative for congestion, ear pain, rhinorrhea and sinus pressure.   Eyes: Negative for redness.  Respiratory: Positive for cough. Negative for wheezing.   Gastrointestinal: Negative for abdominal pain, diarrhea, nausea and vomiting.  Genitourinary: Negative for dysuria.  Musculoskeletal: Positive for back pain. Negative for myalgias and neck stiffness.  Skin: Negative for rash.  Neurological: Negative for headaches.  Hematological: Negative for adenopathy.     Physical Exam Updated Vital Signs BP (!) 165/109 (BP Location: Left Arm)  Pulse 90   Temp 99 F (37.2 C) (Oral)   Resp 20   Ht  (1.575 m)   Wt 108 kg (238 lb 1 oz)   SpO2 98%   BMI 43.54 kg/m   Physical Exam  Constitutional: She appears well-developed and well-nourished.  HENT:  Head: Normocephalic and atraumatic.  Right Ear: Tympanic membrane, external ear and ear canal normal.  Left Ear: Tympanic membrane, external ear and ear canal normal.  Nose: Nose normal. No mucosal edema or rhinorrhea.  Mouth/Throat: Uvula is midline, oropharynx is clear and moist and mucous membranes are normal. Mucous  membranes are not dry. No oral lesions. No trismus in the jaw. No uvula swelling. No oropharyngeal exudate, posterior oropharyngeal edema, posterior oropharyngeal erythema or tonsillar abscesses.  Eyes: Conjunctivae are normal. Right eye exhibits no discharge. Left eye exhibits no discharge.  Neck: Normal range of motion. Neck supple.  Cardiovascular: Normal rate, regular rhythm and normal heart sounds.   Pulmonary/Chest: Effort normal and breath sounds normal. No respiratory distress. She has no wheezes. She has no rales.  Abdominal: Soft. There is no tenderness.  Musculoskeletal:       Cervical back: She exhibits tenderness. She exhibits normal range of motion and no bony tenderness.       Thoracic back: She exhibits normal range of motion, no tenderness and no bony tenderness.       Lumbar back: Normal.       Back:  Lymphadenopathy:    She has no cervical adenopathy.  Neurological: She is alert.  Skin: Skin is warm and dry.  Psychiatric: She has a normal mood and affect.  Nursing note and vitals reviewed.    ED Treatments / Results  Labs (all labs ordered are listed, but only abnormal results are displayed) Labs Reviewed  RAPID STREP SCREEN (NOT AT [facility])  CULTURE, GROUP A STREP [facility])    EKG  EKG Interpretation None       Radiology Dg Chest 2 View  Result Date: 02/02/2017 CLINICAL DATA:  Patient with cough, congestion and fever. EXAM: CHEST  2 VIEW COMPARISON:  Chest radiograph 02/08/2016 FINDINGS: Stable enlarged cardiac and mediastinal contours. No consolidative pulmonary opacities. No pleural effusion or pneumothorax. Regional skeleton is unremarkable. Right upper quadrant surgical clips. IMPRESSION: Cardiomegaly.  No acute cardiopulmonary process. Electronically Signed   By: Annia Belt M.D.   On: 02/02/2017 14:36    Procedures Procedures (including critical care time)  Medications Ordered in ED Medications - No data to display   Initial Impression / Assessment  and Plan / ED Course  I have reviewed the triage vital signs and the nursing notes.  Pertinent labs & imaging results that were available during my care of the patient were reviewed by me and considered in my medical decision making (see chart for details).     Patient seen and examined. CXR ordered to r/o PNA.   Vital signs reviewed and are as follows: BP (!) 165/109 (BP Location: Left Arm)   Pulse 90   Temp 99 F (37.2 C) (Oral)   Resp 20   Ht  (1.575 m)   Wt 108 kg (238 lb 1 oz)   SpO2 98%   BMI 43.54 kg/m   Pt updated on results. Home with tessalon and naproxen. Patient counseled on supportive care for viral URI and s/s to return including worsening symptoms, persistent fever, persistent vomiting, or if they have any other concerns. Urged to see PCP if symptoms persist for  more than 3 days. Patient verbalizes understanding and agrees with plan.      Final Clinical Impressions(s) / ED Diagnoses   Final diagnoses:  Laryngitis  Cough   Patient with symptoms consistent with a viral syndrome. CXR neg. Vitals are stable, no fever. No signs of dehydration. Supportive therapy indicated with return if symptoms worsen.     New Prescriptions Discharge Medication List as of 02/02/2017  2:43 PM    START taking these medications   Details  naproxen (NAPROSYN) 500 MG tablet Take 1 tablet (500 mg total) by mouth 2 (two) times daily., Starting Sat 02/02/2017, Print         Renne CriglerGeiple, Damonie Ellenwood, PA-C 02/02/17 1551    Abelino DerrickMackuen, Courteney Lyn, MD 02/07/17 1042

## 2017-02-05 LAB — CULTURE, GROUP A STREP (THRC)

## 2017-05-07 ENCOUNTER — Emergency Department (HOSPITAL_BASED_OUTPATIENT_CLINIC_OR_DEPARTMENT_OTHER)
Admission: EM | Admit: 2017-05-07 | Discharge: 2017-05-07 | Disposition: A | Discharge status: Home / Self Care | Payer: Self-pay | Attending: Emergency Medicine | Admitting: Emergency Medicine

## 2017-05-07 ENCOUNTER — Other Ambulatory Visit: Payer: Self-pay

## 2017-05-07 ENCOUNTER — Encounter (HOSPITAL_BASED_OUTPATIENT_CLINIC_OR_DEPARTMENT_OTHER): Payer: Self-pay | Admitting: Emergency Medicine

## 2017-05-07 ENCOUNTER — Emergency Department (HOSPITAL_BASED_OUTPATIENT_CLINIC_OR_DEPARTMENT_OTHER): Payer: Self-pay

## 2017-05-07 DIAGNOSIS — I10 Essential (primary) hypertension: Secondary | ICD-10-CM | POA: Insufficient documentation

## 2017-05-07 DIAGNOSIS — R109 Unspecified abdominal pain: Secondary | ICD-10-CM

## 2017-05-07 DIAGNOSIS — Z79899 Other long term (current) drug therapy: Secondary | ICD-10-CM | POA: Insufficient documentation

## 2017-05-07 DIAGNOSIS — R1032 Left lower quadrant pain: Secondary | ICD-10-CM | POA: Insufficient documentation

## 2017-05-07 DIAGNOSIS — R110 Nausea: Secondary | ICD-10-CM | POA: Insufficient documentation

## 2017-05-07 LAB — WET PREP, GENITAL
SPERM: NONE SEEN
Trich, Wet Prep: NONE SEEN
YEAST WET PREP: NONE SEEN

## 2017-05-07 LAB — CBC WITH DIFFERENTIAL/PLATELET
BASOS ABS: 0 10*3/uL (ref 0.0–0.1)
BASOS PCT: 1 %
EOS ABS: 0.1 10*3/uL (ref 0.0–0.7)
EOS PCT: 1 %
HCT: 37 % (ref 36.0–46.0)
Hemoglobin: 12.4 g/dL (ref 12.0–15.0)
Lymphocytes Relative: 30 %
Lymphs Abs: 1.8 10*3/uL (ref 0.7–4.0)
MCH: 28.6 pg (ref 26.0–34.0)
MCHC: 33.5 g/dL (ref 30.0–36.0)
MCV: 85.3 fL (ref 78.0–100.0)
Monocytes Absolute: 0.5 10*3/uL (ref 0.1–1.0)
Monocytes Relative: 8 %
Neutro Abs: 3.6 10*3/uL (ref 1.7–7.7)
Neutrophils Relative %: 60 %
PLATELETS: 333 10*3/uL (ref 150–400)
RBC: 4.34 MIL/uL (ref 3.87–5.11)
RDW: 12.6 % (ref 11.5–15.5)
WBC: 6 10*3/uL (ref 4.0–10.5)

## 2017-05-07 LAB — COMPREHENSIVE METABOLIC PANEL
ALT: 15 U/L (ref 14–54)
AST: 18 U/L (ref 15–41)
Albumin: 3.5 g/dL (ref 3.5–5.0)
Alkaline Phosphatase: 83 U/L (ref 38–126)
Anion gap: 4 — ABNORMAL LOW (ref 5–15)
BUN: 10 mg/dL (ref 6–20)
CHLORIDE: 101 mmol/L (ref 101–111)
CO2: 29 mmol/L (ref 22–32)
CREATININE: 0.73 mg/dL (ref 0.44–1.00)
Calcium: 8.7 mg/dL — ABNORMAL LOW (ref 8.9–10.3)
GFR calc non Af Amer: >60 mL/min (ref >60)
Glucose, Bld: 115 mg/dL — ABNORMAL HIGH (ref 65–99)
Potassium: 3.3 mmol/L — ABNORMAL LOW (ref 3.5–5.1)
SODIUM: 134 mmol/L — AB (ref 135–145)
Total Bilirubin: 0.4 mg/dL (ref 0.3–1.2)
Total Protein: 8.3 g/dL — ABNORMAL HIGH (ref 6.5–8.1)

## 2017-05-07 LAB — URINALYSIS, ROUTINE W REFLEX MICROSCOPIC
BILIRUBIN URINE: NEGATIVE
Glucose, UA: NEGATIVE mg/dL
Hgb urine dipstick: NEGATIVE
KETONES UR: NEGATIVE mg/dL
Leukocytes, UA: NEGATIVE
NITRITE: NEGATIVE
PROTEIN: NEGATIVE mg/dL
Specific Gravity, Urine: 1.015 (ref 1.005–1.030)
pH: 7 (ref 5.0–8.0)

## 2017-05-07 LAB — PREGNANCY, URINE: PREG TEST UR: NEGATIVE

## 2017-05-07 LAB — LIPASE, BLOOD: Lipase: 17 U/L (ref 11–51)

## 2017-05-07 MED ORDER — ONDANSETRON HCL 4 MG/2ML IJ SOLN
4.0000 mg | Freq: Once | INTRAMUSCULAR | Status: AC
  Administered 2017-05-07: 4 mg via INTRAVENOUS
  Filled 2017-05-07: qty 2

## 2017-05-07 MED ORDER — KETOROLAC TROMETHAMINE 30 MG/ML IJ SOLN
30.0000 mg | Freq: Once | INTRAMUSCULAR | Status: AC
  Administered 2017-05-07: 30 mg via INTRAVENOUS
  Filled 2017-05-07: qty 1

## 2017-05-07 MED ORDER — CYCLOBENZAPRINE HCL 10 MG PO TABS: 10.0000 mg | ORAL_TABLET | Freq: Three times a day (TID) | ORAL | 0 refills | Status: DC | PRN

## 2017-05-07 NOTE — ED Triage Notes (Signed)
Pt having some left sided flank pain over one week ago that has progressed to abdominal pain around left side.  Frequency and urgency along with nausea.

## 2017-05-07 NOTE — Discharge Instructions (Signed)
You may alternate Tylenol 1000 mg every 6 hours as needed for pain and Ibuprofen 800 mg every 8 hours as needed for pain.  Please take Ibuprofen with food. ° ° ° °To find a primary care or specialty doctor please call [phone] or [phone] to access "[facility] Find a Doctor Service." ° °You may also go on the [facility] website at [email or web address] ° °There are also multiple [facility], [facility], [facility] and [facility] practices throughout the [place] that are frequently accepting new patients. You may find a clinic that is close to your home and contact them. ° °[facility] -  °[address] °[place] [address] °[phone] ° ° °[facility] -  °[address] °[place] [address] °[phone] ° ° °[facility] - °[address]  °[place] [address] °[phone] ° ° °

## 2017-05-07 NOTE — ED Provider Notes (Signed)
TIME SEEN: 1:14 PM  CHIEF COMPLAINT: Left-sided flank pain  HPI: Patient is a 41 year old female with history of hypertension, anemia who presents to the emergency department with a week of left flank pain that is sharp in nature and now radiates into the left lower abdomen.  She has had nausea but no vomiting.  No fevers, chills, dysuria, hematuria.  No vaginal bleeding or discharge.  She has urinary frequency.  Has never had similar symptoms.  No history of kidney stones.  Status post cholecystectomy, BTL and endometrial ablation.  No aggravating or relieving factors.  No injury to her back.  No numbness, tingling or focal weakness.  ROS: See HPI Constitutional: no fever  Eyes: no drainage  ENT: no runny nose   Cardiovascular:  no chest pain  Resp: no SOB  GI: no vomiting GU: no dysuria Integumentary: no rash  Allergy: no hives  Musculoskeletal: no leg swelling  Neurological: no slurred speech ROS otherwise negative  PAST MEDICAL HISTORY/PAST SURGICAL HISTORY:  Past Medical History:  Diagnosis Date  . Anemia   . Hypertension     MEDICATIONS:  Prior to Admission medications   Medication Sig Start Date End Date Taking? Authorizing Provider  hydrochlorothiazide (HYDRODIURIL) 12.5 MG tablet Take 1 tablet (12.5 mg total) by mouth daily. 06/17/15   Eber HongMiller, Brian, MD    ALLERGIES:  No Known Allergies  SOCIAL HISTORY:  Social History   Tobacco Use  . Smoking status: Never Smoker  . Smokeless tobacco: Never Used  Substance Use Topics  . Alcohol use: No    FAMILY HISTORY: Family History  Problem Relation Age of Onset  . COPD Unknown   . Hypertension Unknown   . Diabetes Unknown   . Cancer Unknown     EXAM: BP (!) 167/92 (BP Location: Left Arm)   Pulse 76   Temp 98.1 F (36.7 C) (Oral)   Resp 14   Ht 5\' 2"  (1.575 m)   Wt 108.9 kg (240 lb)   SpO2 100%   BMI 43.90 kg/m  CONSTITUTIONAL: Alert and oriented and responds appropriately to questions. Well-appearing;  well-nourished HEAD: Normocephalic EYES: Conjunctivae clear, pupils appear equal, EOMI ENT: normal nose; moist mucous membranes NECK: Supple, no meningismus, no nuchal rigidity, no LAD  CARD: RRR; S1 and S2 appreciated; no murmurs, no clicks, no rubs, no gallops RESP: Normal chest excursion without splinting or tachypnea; breath sounds clear and equal bilaterally; no wheezes, no rhonchi, no rales, no hypoxia or respiratory distress, speaking full sentences ABD/GI: Normal bowel sounds; non-distended; soft, tender to palpation in the left lower quadrant, no rebound, no guarding, no peritoneal signs, no hepatosplenomegaly GU:  Normal external genitalia. No lesions, rashes noted. Patient has no vaginal bleeding on exam. No vaginal discharge.  No adnexal tenderness, mass or fullness, no cervical motion tenderness. Cervix is not appear friable.  Cervix is closed.  Chaperone present for exam. BACK:  The back appears normal and is tender over the left lower lumbar paraspinal muscles that wraps around into the left lower quadrant, there is no CVA tenderness, no midline spinal tenderness or step-off or deformity EXT: Normal ROM in all joints; non-tender to palpation; no edema; normal capillary refill; no cyanosis, no calf tenderness or swelling    SKIN: Normal color for age and race; warm; no rash NEURO: Moves all extremities equally PSYCH: The patient's mood and manner are appropriate. Grooming and personal hygiene are appropriate.  MEDICAL DECISION MAKING: Patient here with left-sided flank and left lower quadrant  pain.  Labs are unremarkable.  Urine shows no sign of infection or blood.  Pregnancy test negative.  Differential diagnosis includes kidney stone, diverticulitis, colitis, musculoskeletal pain.  Will give Toradol, Zofran for symptomatic relief.  Will obtain a CT of her abdomen for further evaluation.  ED PROGRESS: Patient's workup is unremarkable.  Normal CT scan.  I performed a pelvic exam and  she has no vaginal bleeding or discharge.  She has no adnexal tenderness, fullness or cervical motion tenderness.  Doubt TOA, PID, torsion, ectopic.  Wet prep does show clue cells but given she has no discharge, odor or itching I do not feel she needs treatment at this time.  I suspect now that this is musculoskeletal pain.  Patient does state pain is worse with movement, twisting.  She reports significant improvement with Toradol.  Will discharge with prescriptions for ibuprofen and Flexeril.  I feel she is safe for discharge home.  Patient is comfortable with this plan.   At this time, I do not feel there is any life-threatening condition present. I have reviewed and discussed all results (EKG, imaging, lab, urine as appropriate) and exam findings with patient/family. I have reviewed nursing notes and appropriate previous records.  I feel the patient is safe to be discharged home without further emergent workup and can continue workup as an outpatient as needed. Discussed usual and customary return precautions. Patient/family verbalize understanding and are comfortable with this plan.  Outpatient follow-up has been provided if needed. All questions have been answered.      Sarah Baez, Layla MawKristen N, DO 05/07/17 [phone]

## 2017-05-07 NOTE — ED Notes (Signed)
Family at bedside. 

## 2017-05-08 LAB — GC/CHLAMYDIA PROBE AMP (~~LOC~~) NOT AT ARMC
Chlamydia: NEGATIVE
Neisseria Gonorrhea: NEGATIVE

## 2017-08-14 ENCOUNTER — Other Ambulatory Visit: Payer: Self-pay

## 2017-08-14 ENCOUNTER — Encounter (HOSPITAL_BASED_OUTPATIENT_CLINIC_OR_DEPARTMENT_OTHER): Payer: Self-pay | Admitting: Emergency Medicine

## 2017-08-14 ENCOUNTER — Emergency Department (HOSPITAL_BASED_OUTPATIENT_CLINIC_OR_DEPARTMENT_OTHER)
Admission: EM | Admit: 2017-08-14 | Discharge: 2017-08-14 | Disposition: A | Discharge status: Home / Self Care | Payer: Self-pay | Attending: Emergency Medicine | Admitting: Emergency Medicine

## 2017-08-14 DIAGNOSIS — Z79899 Other long term (current) drug therapy: Secondary | ICD-10-CM | POA: Insufficient documentation

## 2017-08-14 DIAGNOSIS — I10 Essential (primary) hypertension: Secondary | ICD-10-CM | POA: Insufficient documentation

## 2017-08-14 DIAGNOSIS — R109 Unspecified abdominal pain: Secondary | ICD-10-CM | POA: Insufficient documentation

## 2017-08-14 DIAGNOSIS — R110 Nausea: Secondary | ICD-10-CM | POA: Insufficient documentation

## 2017-08-14 LAB — PREGNANCY, URINE: Preg Test, Ur: NEGATIVE

## 2017-08-14 LAB — URINALYSIS, ROUTINE W REFLEX MICROSCOPIC
Bilirubin Urine: NEGATIVE
GLUCOSE, UA: NEGATIVE mg/dL
Hgb urine dipstick: NEGATIVE
KETONES UR: NEGATIVE mg/dL
LEUKOCYTES UA: NEGATIVE
Nitrite: NEGATIVE
PH: 6 (ref 5.0–8.0)
PROTEIN: NEGATIVE mg/dL
Specific Gravity, Urine: 1.025 (ref 1.005–1.030)

## 2017-08-14 MED ORDER — NAPROXEN 500 MG PO TABS: 500.0000 mg | ORAL_TABLET | Freq: Two times a day (BID) | ORAL | 0 refills | Status: DC

## 2017-08-14 NOTE — ED Triage Notes (Signed)
Left flank pain at intervals and frequency in urination x 2 days, with nausea this am

## 2017-08-14 NOTE — Discharge Instructions (Signed)
Urinalysis negative for urinary tract infection.  Pregnancy test negative.  Trial of Naprosyn for 7 days.  Feel that symptoms may be musculoskeletal in nature.  Return for any new or worse symptoms.

## 2017-08-14 NOTE — ED Provider Notes (Signed)
[facility] EMERGENCY DEPARTMENT Provider Note   CSN: [id number] Arrival date & time: 08/14/17  [id number]     History   Chief Complaint Chief Complaint  Patient presents with  . Flank Pain    HPI Amy [name] is a 42 y.o. female.  Patient with complaint of left flank pain and frequency of urination for the past 2 days associated with some nausea but no vomiting and a feeling of chills.  Patient with similar presentation in December.  Chart reviewed.  Patient had CT renal study done that was negative also had pelvic done that was negative for any significant infection.  Urinalysis was negative at that time 2.      Past Medical History:  Diagnosis Date  . Anemia   . Hypertension     Patient Active Problem List   Diagnosis Date Noted  . Excessive or frequent menstruation 09/10/2012    Past Surgical History:  Procedure Laterality Date  . CHOLECYSTECTOMY  2006  . DILITATION & CURRETTAGE/HYSTROSCOPY WITH HYDROTHERMAL ABLATION N/A 07/10/2013   Procedure: DILATATION & CURETTAGE/HYSTEROSCOPY WITH HYDROTHERMAL ABLATION;  Surgeon: Brock Badharles A Harper, MD;  Location: [facility];  Service: Gynecology;  Laterality: N/A;  . TUBAL LIGATION  1997  . WISDOM TOOTH EXTRACTION  1998    OB History    Gravida Para Term Preterm AB Living   2 2 2     2    SAB TAB Ectopic Multiple Live Births           2       Home Medications    Prior to Admission medications   Medication Sig Start Date End Date Taking? Authorizing Provider  cyclobenzaprine (FLEXERIL) 10 MG tablet Take 1 tablet (10 mg total) by mouth 3 (three) times daily as needed for muscle spasms. 05/07/17   Ward, Layla MawKristen N, DO  hydrochlorothiazide (HYDRODIURIL) 12.5 MG tablet Take 1 tablet (12.5 mg total) by mouth daily. 06/17/15   Eber HongMiller, Brian, MD  naproxen (NAPROSYN) 500 MG tablet Take 1 tablet (500 mg total) by mouth 2 (two) times daily. 08/14/17   Vanetta MuldersZackowski, Sarayah Bacchi, MD    Family History Family History  Problem  Relation Age of Onset  . COPD Unknown   . Hypertension Unknown   . Diabetes Unknown   . Cancer Unknown     Social History Social History   Tobacco Use  . Smoking status: Never Smoker  . Smokeless tobacco: Never Used  Substance Use Topics  . Alcohol use: No  . Drug use: No     Allergies   Patient has no known allergies.   Review of Systems Review of Systems  Constitutional: Positive for chills. Negative for fever.  HENT: Negative for congestion.   Respiratory: Negative for shortness of breath.   Cardiovascular: Negative for chest pain.  Gastrointestinal: Positive for nausea. Negative for abdominal pain, diarrhea and vomiting.  Genitourinary: Positive for dysuria and flank pain. Negative for vaginal discharge.  Musculoskeletal: Negative for back pain.  Skin: Negative for rash.  Neurological: Negative for headaches.  Hematological: Does not bruise/bleed easily.  Psychiatric/Behavioral: Negative for confusion.     Physical Exam Updated Vital Signs BP (!) 163/103 (BP Location: Right Arm)   Pulse 83   Temp 98.2 F (36.8 C) (Oral)   Resp 18   SpO2 100%   Physical Exam  Constitutional: She is oriented to person, place, and time. She appears well-developed and well-nourished. No distress.  HENT:  Head: Normocephalic and atraumatic.  Eyes: Conjunctivae and  EOM are normal. Pupils are equal, round, and reactive to light.  Neck: Neck supple.  Cardiovascular: Normal rate and regular rhythm.  Pulmonary/Chest: Effort normal and breath sounds normal.  Abdominal: Soft. Bowel sounds are normal. There is no tenderness.  Neurological: She is alert and oriented to person, place, and time. No cranial nerve deficit or sensory deficit. She exhibits normal muscle tone. Coordination normal.  Skin: Skin is warm.  Nursing note and vitals reviewed.    ED Treatments / Results  Labs (all labs ordered are listed, but only abnormal results are displayed) Labs Reviewed  URINALYSIS,  ROUTINE W REFLEX MICROSCOPIC  PREGNANCY, URINE    EKG  EKG Interpretation None       Radiology No results found.  Procedures Procedures (including critical care time)  Medications Ordered in ED Medications - No data to display   Initial Impression / Assessment and Plan / ED Course  I have reviewed the triage vital signs and the nursing notes.  Pertinent labs & imaging results that were available during my care of the patient were reviewed by me and considered in my medical decision making (see chart for details).    Patient symptoms suggestive of urinary tract infection however urinalysis negative.  No evidence on urinalysis of urinary tract infection.  Pregnancy test negative.  Patient's abdomen is soft nontender no tenderness to palpation to the left flank or left CVA area.  Patient nontoxic no acute distress.  Patient with full workup for exact same presentation in December with negative pelvic negative CT renal study and urinalysis was negative at that time.  Symptoms may very well be musculoskeletal although not reproducible.  Will give a trial of Naprosyn.  We will have patient return if not improved or for new or worse symptoms.  Do not feel pelvic exam is needed patient without any discharge no tenderness.  In addition it was negative in December for similar symptoms.   Final Clinical Impressions(s) / ED Diagnoses   Final diagnoses:  Flank pain    ED Discharge Orders        Ordered    naproxen (NAPROSYN) 500 MG tablet  2 times daily     08/14/17 0906       Vanetta Mulders, MD 08/14/17 [phone]

## 2017-09-07 ENCOUNTER — Other Ambulatory Visit: Payer: Self-pay

## 2017-09-07 ENCOUNTER — Emergency Department (HOSPITAL_BASED_OUTPATIENT_CLINIC_OR_DEPARTMENT_OTHER): Payer: Self-pay

## 2017-09-07 ENCOUNTER — Encounter (HOSPITAL_BASED_OUTPATIENT_CLINIC_OR_DEPARTMENT_OTHER): Payer: Self-pay | Admitting: Respiratory Therapy

## 2017-09-07 ENCOUNTER — Emergency Department (HOSPITAL_BASED_OUTPATIENT_CLINIC_OR_DEPARTMENT_OTHER)
Admission: EM | Admit: 2017-09-07 | Discharge: 2017-09-07 | Disposition: A | Discharge status: Home / Self Care | Payer: Self-pay | Attending: Emergency Medicine | Admitting: Emergency Medicine

## 2017-09-07 DIAGNOSIS — R072 Precordial pain: Secondary | ICD-10-CM | POA: Insufficient documentation

## 2017-09-07 DIAGNOSIS — I10 Essential (primary) hypertension: Secondary | ICD-10-CM | POA: Insufficient documentation

## 2017-09-07 LAB — CBC WITH DIFFERENTIAL/PLATELET
Basophils Absolute: 0 10*3/uL (ref 0.0–0.1)
Basophils Relative: 0 %
Eosinophils Absolute: 0.1 10*3/uL (ref 0.0–0.7)
Eosinophils Relative: 1 %
HEMATOCRIT: 36.8 % (ref 36.0–46.0)
HEMOGLOBIN: 12.1 g/dL (ref 12.0–15.0)
LYMPHS PCT: 30 %
Lymphs Abs: 2 10*3/uL (ref 0.7–4.0)
MCH: 28.2 pg (ref 26.0–34.0)
MCHC: 32.9 g/dL (ref 30.0–36.0)
MCV: 85.8 fL (ref 78.0–100.0)
MONOS PCT: 9 %
Monocytes Absolute: 0.6 10*3/uL (ref 0.1–1.0)
NEUTROS ABS: 3.9 10*3/uL (ref 1.7–7.7)
NEUTROS PCT: 60 %
Platelets: 344 10*3/uL (ref 150–400)
RBC: 4.29 MIL/uL (ref 3.87–5.11)
RDW: 13.1 % (ref 11.5–15.5)
WBC: 6.5 10*3/uL (ref 4.0–10.5)

## 2017-09-07 LAB — BASIC METABOLIC PANEL
Anion gap: 7 (ref 5–15)
BUN: 9 mg/dL (ref 6–20)
CHLORIDE: 104 mmol/L (ref 101–111)
CO2: 26 mmol/L (ref 22–32)
CREATININE: 0.68 mg/dL (ref 0.44–1.00)
Calcium: 9 mg/dL (ref 8.9–10.3)
GFR calc Af Amer: >60 mL/min (ref >60)
GFR calc non Af Amer: >60 mL/min (ref >60)
Glucose, Bld: 105 mg/dL — ABNORMAL HIGH (ref 65–99)
POTASSIUM: 3.8 mmol/L (ref 3.5–5.1)
Sodium: 137 mmol/L (ref 135–145)

## 2017-09-07 LAB — D-DIMER, QUANTITATIVE: D-Dimer, Quant: 0.33 ug/mL-FEU (ref 0.00–0.50)

## 2017-09-07 LAB — TROPONIN I: Troponin I: <0.03 ng/mL (ref <0.03)

## 2017-09-07 MED ORDER — IBUPROFEN 800 MG PO TABS: 800.0000 mg | ORAL_TABLET | Freq: Three times a day (TID) | ORAL | 0 refills | Status: DC | PRN

## 2017-09-07 NOTE — ED Notes (Signed)
Pt given Rx x 1 for ibuprofen. Ambulatory to d/c window with steady gait

## 2017-09-07 NOTE — ED Notes (Signed)
PA student at bedside.

## 2017-09-07 NOTE — ED Notes (Signed)
Pt to XR at this time

## 2017-09-07 NOTE — ED Triage Notes (Signed)
Intermittent chest pain radiating into both shoulders x 1 week.

## 2017-09-07 NOTE — ED Notes (Signed)
Xray delay due to PA student talking to pt

## 2017-09-07 NOTE — ED Provider Notes (Signed)
Emergency Department Provider Note   I have reviewed the triage vital signs and the nursing notes.   HISTORY  Chief Complaint Chest Pain   HPI Amy [name] is a [age] y.o. female with PMH of HTN presents to the emergency department for evaluation of intermittent chest pain radiating to the shoulders over the last week. Patient reports slight worsening pain with breathing but no specific SOB. No fever, chills, or productive cough. No hemoptysis. No radiation of symptoms or modifying factors.    Past Medical History:  Diagnosis Date  . Anemia   . Hypertension     Patient Active Problem List   Diagnosis Date Noted  . Excessive or frequent menstruation 09/10/2012    Past Surgical History:  Procedure Laterality Date  . CHOLECYSTECTOMY  2006  . DILITATION & CURRETTAGE/HYSTROSCOPY WITH HYDROTHERMAL ABLATION N/A 07/10/2013   Procedure: DILATATION & CURETTAGE/HYSTEROSCOPY WITH HYDROTHERMAL ABLATION;  Surgeon: Brock Badharles A Harper, MD;  Location: [facility];  Service: Gynecology;  Laterality: N/A;  . TUBAL LIGATION  1997  . WISDOM TOOTH EXTRACTION  1998    Current Outpatient Rx  . Order #: [id number] Class: Print  . Order #: [id number] Class: Print  . Order #: [id number] Class: Print  . Order #: [id number] Class: Print    Allergies Patient has no known allergies.  Family History  Problem Relation Age of Onset  . COPD Unknown   . Hypertension Unknown   . Diabetes Unknown   . Cancer Unknown     Social History Social History   Tobacco Use  . Smoking status: Never Smoker  . Smokeless tobacco: Never Used  Substance Use Topics  . Alcohol use: No  . Drug use: No    Review of Systems  Constitutional: No fever/chills Eyes: No visual changes. ENT: No sore throat. Cardiovascular: Positive chest pain. Respiratory: Denies shortness of breath. Gastrointestinal: No abdominal pain.  No nausea, no vomiting.  No diarrhea.  No constipation. Genitourinary: Negative for  dysuria. Musculoskeletal: Negative for back pain. Skin: Negative for rash. Neurological: Negative for headaches, focal weakness or numbness.  10-point ROS otherwise negative.  ____________________________________________   PHYSICAL EXAM:  VITAL SIGNS: ED Triage Vitals  Enc Vitals Group     BP 09/07/17 1139 (!) 194/98     Pulse Rate 09/07/17 1137 78     Resp 09/07/17 1137 18     Temp 09/07/17 1137 99.2 F (37.3 C)     Temp Source 09/07/17 1137 Oral     SpO2 09/07/17 1137 98 %     Weight 09/07/17 1135 215 lb (97.5 kg)     Height 09/07/17 1135 5\' 2"  (1.575 m)     Pain Score 09/07/17 1135 10   Constitutional: Alert and oriented. Well appearing and in no acute distress. Eyes: Conjunctivae are normal.  Head: Atraumatic. Nose: No congestion/rhinnorhea. Mouth/Throat: Mucous membranes are moist.  Neck: No stridor.  Cardiovascular: Normal rate, regular rhythm. Good peripheral circulation. Grossly normal heart sounds.   Respiratory: Normal respiratory effort.  No retractions. Lungs CTAB. Gastrointestinal: Soft and nontender. No distention.  Musculoskeletal: No lower extremity tenderness nor edema. No gross deformities of extremities. Neurologic:  Normal speech and language. No gross focal neurologic deficits are appreciated.  Skin:  Skin is warm, dry and intact. No rash noted.  ____________________________________________   LABS (all labs ordered are listed, but only abnormal results are displayed)  Labs Reviewed  BASIC METABOLIC PANEL - Abnormal; Notable for the following components:      Result Value  Glucose, Bld 105 (*)    All other components within normal limits  CBC WITH DIFFERENTIAL/PLATELET  TROPONIN I  D-DIMER, QUANTITATIVE (NOT AT [facility])   ____________________________________________  EKG   EKG Interpretation  Date/Time:  Saturday September 07 2017 11:36:58 EDT Ventricular Rate:  77 PR Interval:  150 QRS Duration: 84 QT Interval:  390 QTC  Calculation: 441 R Axis:   65 Text Interpretation:  Normal sinus rhythm Normal ECG No STEMI.  Confirmed by Alona Bene [phone]) on 09/07/2017 11:39:43 AM       ____________________________________________  RADIOLOGY  Dg Chest 2 View  Result Date: 09/07/2017 CLINICAL DATA:  Diffuse chest pain for the past week. EXAM: CHEST - 2 VIEW COMPARISON:  Chest x-ray dated February 02, 2017. FINDINGS: Stable cardiomegaly. Normal pulmonary vascularity. No focal consolidation, pleural effusion, or pneumothorax. No acute osseous abnormality. IMPRESSION: No active cardiopulmonary disease. Electronically Signed   By: Obie Dredge M.D.   On: 09/07/2017 12:50    ____________________________________________   PROCEDURES  Procedure(s) performed:   Procedures  None ____________________________________________   INITIAL IMPRESSION / ASSESSMENT AND PLAN / ED COURSE  Pertinent labs & imaging results that were available during my care of the patient were reviewed by me and considered in my medical decision making (see chart for details).  Patient presents to the ED with chest pain. Low ACS concern. Doubt PE but with some pleuritic pain plan for d-dimer.   Differential includes all life-threatening causes for chest pain. This includes but is not exclusive to acute coronary syndrome, aortic dissection, pulmonary embolism, cardiac tamponade, community-acquired pneumonia, pericarditis, musculoskeletal chest wall pain, etc.  Labs and CXR reviewed. No acute findings. Plan for PCP follow up and Motrin for pain.   At this time, I do not feel there is any life-threatening condition present. I have reviewed and discussed all results (EKG, imaging, lab, urine as appropriate), exam findings with patient. I have reviewed nursing notes and appropriate previous records.  I feel the patient is safe to be discharged home without further emergent workup. Discussed usual and customary return precautions. Patient and  family (if present) verbalize understanding and are comfortable with this plan.  Patient will follow-up with their primary care provider. If they do not have a primary care provider, information for follow-up has been provided to them. All questions have been answered.  ____________________________________________  FINAL CLINICAL IMPRESSION(S) / ED DIAGNOSES  Final diagnoses:  Precordial chest pain    NEW OUTPATIENT MEDICATIONS STARTED DURING THIS VISIT:  Discharge Medication List as of 09/07/2017  1:40 PM    START taking these medications   Details  ibuprofen (ADVIL,MOTRIN) 800 MG tablet Take 1 tablet (800 mg total) by mouth every 8 (eight) hours as needed., Starting Sat 09/07/2017, Print        Note:  This document was prepared using Dragon voice recognition software and may include unintentional dictation errors.  Alona Bene, MD Emergency Medicine    Juvenal Umar, Arlyss Repress, MD 09/07/17 1901

## 2017-09-07 NOTE — Discharge Instructions (Signed)

## 2017-09-29 ENCOUNTER — Emergency Department (HOSPITAL_BASED_OUTPATIENT_CLINIC_OR_DEPARTMENT_OTHER)
Admission: EM | Admit: 2017-09-29 | Discharge: 2017-09-29 | Disposition: A | Discharge status: Home / Self Care | Payer: Self-pay | Attending: Emergency Medicine | Admitting: Emergency Medicine

## 2017-09-29 ENCOUNTER — Other Ambulatory Visit: Payer: Self-pay

## 2017-09-29 ENCOUNTER — Encounter (HOSPITAL_BASED_OUTPATIENT_CLINIC_OR_DEPARTMENT_OTHER): Payer: Self-pay | Admitting: *Deleted

## 2017-09-29 ENCOUNTER — Emergency Department (HOSPITAL_BASED_OUTPATIENT_CLINIC_OR_DEPARTMENT_OTHER): Payer: Self-pay

## 2017-09-29 DIAGNOSIS — R109 Unspecified abdominal pain: Secondary | ICD-10-CM | POA: Insufficient documentation

## 2017-09-29 DIAGNOSIS — Z79899 Other long term (current) drug therapy: Secondary | ICD-10-CM | POA: Insufficient documentation

## 2017-09-29 DIAGNOSIS — I10 Essential (primary) hypertension: Secondary | ICD-10-CM | POA: Insufficient documentation

## 2017-09-29 LAB — URINALYSIS, ROUTINE W REFLEX MICROSCOPIC
Bilirubin Urine: NEGATIVE
GLUCOSE, UA: NEGATIVE mg/dL
HGB URINE DIPSTICK: NEGATIVE
Ketones, ur: NEGATIVE mg/dL
Leukocytes, UA: NEGATIVE
NITRITE: NEGATIVE
PH: 6.5 (ref 5.0–8.0)
PROTEIN: NEGATIVE mg/dL
Specific Gravity, Urine: 1.02 (ref 1.005–1.030)

## 2017-09-29 LAB — CBC WITH DIFFERENTIAL/PLATELET
BASOS ABS: 0 10*3/uL (ref 0.0–0.1)
BASOS PCT: 0 %
Eosinophils Absolute: 0.1 10*3/uL (ref 0.0–0.7)
Eosinophils Relative: 1 %
HCT: 37.7 % (ref 36.0–46.0)
HEMOGLOBIN: 12.9 g/dL (ref 12.0–15.0)
LYMPHS PCT: 27 %
Lymphs Abs: 1.6 10*3/uL (ref 0.7–4.0)
MCH: 29 pg (ref 26.0–34.0)
MCHC: 34.2 g/dL (ref 30.0–36.0)
MCV: 84.7 fL (ref 78.0–100.0)
MONO ABS: 0.5 10*3/uL (ref 0.1–1.0)
MONOS PCT: 8 %
NEUTROS PCT: 64 %
Neutro Abs: 3.8 10*3/uL (ref 1.7–7.7)
Platelets: 368 10*3/uL (ref 150–400)
RBC: 4.45 MIL/uL (ref 3.87–5.11)
RDW: 12.9 % (ref 11.5–15.5)
WBC: 5.9 10*3/uL (ref 4.0–10.5)

## 2017-09-29 LAB — BASIC METABOLIC PANEL
ANION GAP: 9 (ref 5–15)
BUN: 8 mg/dL (ref 6–20)
CHLORIDE: 102 mmol/L (ref 101–111)
CO2: 24 mmol/L (ref 22–32)
Calcium: 8.8 mg/dL — ABNORMAL LOW (ref 8.9–10.3)
Creatinine, Ser: 0.68 mg/dL (ref 0.44–1.00)
GFR calc non Af Amer: >60 mL/min (ref >60)
GLUCOSE: 140 mg/dL — AB (ref 65–99)
POTASSIUM: 3.8 mmol/L (ref 3.5–5.1)
Sodium: 135 mmol/L (ref 135–145)

## 2017-09-29 LAB — PREGNANCY, URINE: PREG TEST UR: NEGATIVE

## 2017-09-29 MED ORDER — CYCLOBENZAPRINE HCL 5 MG PO TABS: 5.0000 mg | ORAL_TABLET | Freq: Three times a day (TID) | ORAL | 0 refills | Status: DC | PRN

## 2017-09-29 MED ORDER — KETOROLAC TROMETHAMINE 30 MG/ML IJ SOLN
30.0000 mg | Freq: Once | INTRAMUSCULAR | Status: AC
  Administered 2017-09-29: 30 mg via INTRAVENOUS
  Filled 2017-09-29: qty 1

## 2017-09-29 MED ORDER — ONDANSETRON HCL 4 MG/2ML IJ SOLN
4.0000 mg | Freq: Once | INTRAMUSCULAR | Status: AC
  Administered 2017-09-29: 4 mg via INTRAVENOUS
  Filled 2017-09-29: qty 2

## 2017-09-29 MED ORDER — NAPROXEN 500 MG PO TABS: 500.0000 mg | ORAL_TABLET | Freq: Two times a day (BID) | ORAL | 0 refills | Status: DC

## 2017-09-29 MED ORDER — CYCLOBENZAPRINE HCL 5 MG PO TABS
5.0000 mg | ORAL_TABLET | Freq: Once | ORAL | Status: AC
  Administered 2017-09-29: 5 mg via ORAL
  Filled 2017-09-29: qty 1

## 2017-09-29 MED ORDER — SODIUM CHLORIDE 0.9 % IV BOLUS
1000.0000 mL | Freq: Once | INTRAVENOUS | Status: AC
  Administered 2017-09-29: 1000 mL via INTRAVENOUS

## 2017-09-29 NOTE — ED Notes (Signed)
Patient transported to X-ray 

## 2017-09-29 NOTE — ED Notes (Signed)
Pt ambulated to BR without difficulty

## 2017-09-29 NOTE — ED Notes (Signed)
Pt drinking water 

## 2017-09-29 NOTE — ED Notes (Signed)
Pt given d/c instructions as per chart. Rx x 2 with precautions. Verbalizes understanding. No questions. 

## 2017-09-29 NOTE — ED Notes (Signed)
Pt returned from xray and stated that she felt she needed to go to the restroom again. Ambulated without difficulty.

## 2017-09-29 NOTE — ED Triage Notes (Signed)
Pt c/o sudden onset left flank pain at 9p. Had "detox smoothie" at 7p. Hx stone. Took Naprosyn at 1a without relief. +nausea.

## 2017-09-29 NOTE — ED Provider Notes (Signed)
[facility] EMERGENCY DEPARTMENT Provider Note   CSN: [id number] Arrival date & time: 09/29/17  4098     History   Chief Complaint Chief Complaint  Patient presents with  . Flank Pain    HPI Amy [name] is a 42 y.o. female.  HPI  This is a 42 year old female who presents with left flank pain.  Onset of left flank pain at 9 PM last night.  She reports pain is sharp and nonradiating.  Rates her pain at 10 out of 10.  She took ibuprofen with minimal relief.  It is worse with certain movements and positions.  She denies hematuria or dysuria.  Reports history of similar symptoms in the past.  No known history of kidney stones.  Reports one episode of nonbilious, nonbloody emesis.  No changes in bowel habits.  Denies fevers.  Denies recent illness.  Does report that she drank a detox smoothie which she thinks may contributed.  Of note, chart reviewed and patient had 2 prior similar presentations with negative work-up including CT scan.  Past Medical History:  Diagnosis Date  . Anemia   . Hypertension     Patient Active Problem List   Diagnosis Date Noted  . Excessive or frequent menstruation 09/10/2012    Past Surgical History:  Procedure Laterality Date  . CHOLECYSTECTOMY  2006  . DILITATION & CURRETTAGE/HYSTROSCOPY WITH HYDROTHERMAL ABLATION N/A 07/10/2013   Procedure: DILATATION & CURETTAGE/HYSTEROSCOPY WITH HYDROTHERMAL ABLATION;  Surgeon: Brock Bad, MD;  Location: [facility];  Service: Gynecology;  Laterality: N/A;  . TUBAL LIGATION  1997  . WISDOM TOOTH EXTRACTION  1998     OB History    Gravida  2   Para  2   Term  2   Preterm      AB      Living  2     SAB      TAB      Ectopic      Multiple      Live Births  2            Home Medications    Prior to Admission medications   Medication Sig Start Date End Date Taking? Authorizing Provider  cyclobenzaprine (FLEXERIL) 5 MG tablet Take 1 tablet (5 mg total) by mouth 3  (three) times daily as needed for muscle spasms. 09/29/17   Morrigan Wickens, Mayer Masker, MD  hydrochlorothiazide (HYDRODIURIL) 12.5 MG tablet Take 1 tablet (12.5 mg total) by mouth daily. 06/17/15   Eber Hong, MD  ibuprofen (ADVIL,MOTRIN) 800 MG tablet Take 1 tablet (800 mg total) by mouth every 8 (eight) hours as needed. 09/07/17   Long, Arlyss Repress, MD  naproxen (NAPROSYN) 500 MG tablet Take 1 tablet (500 mg total) by mouth 2 (two) times daily. 08/14/17   Vanetta Mulders, MD  naproxen (NAPROSYN) 500 MG tablet Take 1 tablet (500 mg total) by mouth 2 (two) times daily. 09/29/17   Aqeel Norgaard, Mayer Masker, MD    Family History Family History  Problem Relation Age of Onset  . COPD Unknown   . Hypertension Unknown   . Diabetes Unknown   . Cancer Unknown     Social History Social History   Tobacco Use  . Smoking status: Never Smoker  . Smokeless tobacco: Never Used  Substance Use Topics  . Alcohol use: No  . Drug use: No     Allergies   Patient has no known allergies.   Review of Systems Review of Systems  Constitutional: Negative for fever.  Respiratory: Negative for shortness of breath.   Cardiovascular: Negative for chest pain.  Gastrointestinal: Positive for nausea and vomiting. Negative for abdominal pain and diarrhea.  Genitourinary: Positive for flank pain.  Neurological: Negative for headaches.  All other systems reviewed and are negative.    Physical Exam Updated Vital Signs BP (!) 198/118   Pulse 83   Temp 98.9 F (37.2 C) (Oral)   Resp 20   Ht  (1.575 m)   Wt 97.5 kg (215 lb)   SpO2 100%   BMI 39.32 kg/m   Physical Exam  Constitutional: She is oriented to person, place, and time. She appears well-developed and well-nourished. No distress.  Obese  HENT:  Head: Normocephalic and atraumatic.  Cardiovascular: Normal rate, regular rhythm and normal heart sounds.  Pulmonary/Chest: Effort normal and breath sounds normal. No respiratory distress. She has no wheezes.    Abdominal: Soft. She exhibits no distension. There is no tenderness. There is no rebound and no guarding.  Genitourinary:  Genitourinary Comments: Left-sided flank tenderness to palpation, no overlying skin changes, no CVA tenderness  Neurological: She is alert and oriented to person, place, and time.  Skin: Skin is warm and dry.  Psychiatric: She has a normal mood and affect.  Nursing note and vitals reviewed.    ED Treatments / Results  Labs (all labs ordered are listed, but only abnormal results are displayed) Labs Reviewed  BASIC METABOLIC PANEL - Abnormal; Notable for the following components:      Result Value   Glucose, Bld 140 (*)    Calcium 8.8 (*)    All other components within normal limits  URINALYSIS, ROUTINE W REFLEX MICROSCOPIC  PREGNANCY, URINE  CBC WITH DIFFERENTIAL/PLATELET    EKG None  Radiology Dg Abdomen 1 View  Result Date: 09/29/2017 CLINICAL DATA:  42 y/o  F; sudden onset of left-sided flank pain. EXAM: ABDOMEN - 1 VIEW COMPARISON:  05/07/2017 CT abdomen and pelvis. FINDINGS: The bowel gas pattern is normal. No radio-opaque calculi or other significant radiographic abnormality are seen. Right upper quadrant cholecystectomy clips. Kidneys partially obscured by bowel gas. IMPRESSION: Negative.  Kidneys partially obscured by bowel gas. Electronically Signed   By: Mitzi Hansen M.D.   On: 09/29/2017 06:50    Procedures Procedures (including critical care time)  Medications Ordered in ED Medications  sodium chloride 0.9 % bolus 1,000 mL (1,000 mLs Intravenous New Bag/Given 09/29/17 0519)  ondansetron (ZOFRAN) injection 4 mg (4 mg Intravenous Given 09/29/17 0519)  ketorolac (TORADOL) 30 MG/ML injection 30 mg (30 mg Intravenous Given 09/29/17 0522)  cyclobenzaprine (FLEXERIL) tablet 5 mg (5 mg Oral Given 09/29/17 0557)     Initial Impression / Assessment and Plan / ED Course  I have reviewed the triage vital signs and the nursing notes.  Pertinent  labs & imaging results that were available during my care of the patient were reviewed by me and considered in my medical decision making (see chart for details).     Patient presents with left flank pain.  She is overall nontoxic-appearing on exam and vital signs are notable for blood pressure 198/118.  She has no overlying skin changes to suggest shingles.  2 prior similar episodes with negative work-up.  Work-up initiated.  Patient given Toradol and fluids as well as Zofran.  Work-up including urinalysis and basic lab work is benign.  KUB shows no evidence of radiopaque stone.  Given reproducible pain on exam, question musculoskeletal etiology as  in the past.  Patient was given Flexeril.  Do not feel any further imaging is indicated at this time.  Will discharge home with supportive measures.  After history, exam, and medical workup I feel the patient has been appropriately medically screened and is safe for discharge home. Pertinent diagnoses were discussed with the patient. Patient was given return precautions.   Final Clinical Impressions(s) / ED Diagnoses   Final diagnoses:  Left flank pain  Essential hypertension    ED Discharge Orders        Ordered    naproxen (NAPROSYN) 500 MG tablet  2 times daily     09/29/17 0656    cyclobenzaprine (FLEXERIL) 5 MG tablet  3 times daily PRN     09/29/17 0656       Shon Baton, MD 09/29/17 [phone]

## 2017-09-29 NOTE — Discharge Instructions (Signed)
You were seen today for left flank pain.  Your work-up was reassuring.  The cause of your pain at this time is not clear.  It may be related to musculoskeletal pain.  Take medications as prescribed.  Apply heat or ice to see if this improves.

## 2017-09-29 NOTE — ED Notes (Signed)
Pt triaged by this RN 

## 2018-04-06 ENCOUNTER — Other Ambulatory Visit: Payer: Self-pay

## 2018-04-06 ENCOUNTER — Encounter (HOSPITAL_BASED_OUTPATIENT_CLINIC_OR_DEPARTMENT_OTHER): Payer: Self-pay | Admitting: Emergency Medicine

## 2018-04-06 ENCOUNTER — Emergency Department (HOSPITAL_BASED_OUTPATIENT_CLINIC_OR_DEPARTMENT_OTHER)
Admission: EM | Admit: 2018-04-06 | Discharge: 2018-04-06 | Disposition: A | Discharge status: Home / Self Care | Payer: Self-pay | Attending: Emergency Medicine | Admitting: Emergency Medicine

## 2018-04-06 DIAGNOSIS — M549 Dorsalgia, unspecified: Secondary | ICD-10-CM | POA: Insufficient documentation

## 2018-04-06 DIAGNOSIS — Z79899 Other long term (current) drug therapy: Secondary | ICD-10-CM | POA: Insufficient documentation

## 2018-04-06 DIAGNOSIS — I10 Essential (primary) hypertension: Secondary | ICD-10-CM | POA: Insufficient documentation

## 2018-04-06 DIAGNOSIS — M25512 Pain in left shoulder: Secondary | ICD-10-CM | POA: Insufficient documentation

## 2018-04-06 MED ORDER — DICLOFENAC SODIUM 1 % TD GEL: 4.0000 g | Freq: Four times a day (QID) | TRANSDERMAL | 0 refills | Status: DC

## 2018-04-06 MED ORDER — METHOCARBAMOL 500 MG PO TABS: 500.0000 mg | ORAL_TABLET | Freq: Two times a day (BID) | ORAL | 0 refills | Status: DC

## 2018-04-06 MED ORDER — HYDROCODONE-ACETAMINOPHEN 5-325 MG PO TABS: 1.0000 | ORAL_TABLET | Freq: Four times a day (QID) | ORAL | 0 refills | Status: DC | PRN

## 2018-04-06 MED ORDER — LIDOCAINE 5 % EX PTCH: 1.0000 | MEDICATED_PATCH | CUTANEOUS | 0 refills | Status: DC

## 2018-04-06 NOTE — ED Provider Notes (Addendum)
[facility] EMERGENCY DEPARTMENT Provider Note   CSN: [id number] Arrival date & time: 04/06/18  1624     History   Chief Complaint Chief Complaint  Patient presents with  . Back Pain  . Shoulder Pain    HPI Amy [name] is a 42 y.o. female.  HPI   Amy [name] is a 42 y.o. female, with a history of anemia and HTN, presenting to the ED with left shoulder and upper back pain for the last 3 days.  Describes her pain as a soreness/tightness, moderate, nonradiating.  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  Denies numbness, weakness, neck pain, trauma, or any other complaints.       Past Medical History:  Diagnosis Date  . Anemia   . Hypertension     Patient Active Problem List   Diagnosis Date Noted  . Excessive or frequent menstruation 09/10/2012    Past Surgical History:  Procedure Laterality Date  . CHOLECYSTECTOMY  2006  . DILITATION & CURRETTAGE/HYSTROSCOPY WITH HYDROTHERMAL ABLATION N/A 07/10/2013   Procedure: DILATATION & CURETTAGE/HYSTEROSCOPY WITH HYDROTHERMAL ABLATION;  Surgeon: Brock Bad, MD;  Location: [facility];  Service: Gynecology;  Laterality: N/A;  . TUBAL LIGATION  1997  . WISDOM TOOTH EXTRACTION  1998     OB History    Gravida  2   Para  2   Term  2   Preterm      AB      Living  2     SAB      TAB      Ectopic      Multiple      Live Births  2            Home Medications    Prior to Admission medications   Medication Sig Start Date End Date Taking? Authorizing Provider  diclofenac sodium (VOLTAREN) 1 % GEL Apply 4 g topically 4 (four) times daily. 04/06/18   ,  C, PA-C  hydrochlorothiazide (HYDRODIURIL) 12.5 MG tablet Take 1 tablet (12.5 mg total) by mouth daily. 06/17/15   Eber Hong, MD  HYDROcodone-acetaminophen (NORCO/VICODIN) 5-325 MG tablet Take 1 tablet by mouth every 6 (six) hours as needed for severe pain. 04/06/18   ,  C, PA-C  ibuprofen  (ADVIL,MOTRIN) 800 MG tablet Take 1 tablet (800 mg total) by mouth every 8 (eight) hours as needed. 09/07/17   Long, Arlyss Repress, MD  lidocaine (LIDODERM) 5 % Place 1 patch onto the skin daily. Remove & Discard patch within 12 hours or as directed by MD 04/06/18   ,  C, PA-C  methocarbamol (ROBAXIN) 500 MG tablet Take 1 tablet (500 mg total) by mouth 2 (two) times daily. 04/06/18   ,  C, PA-C  naproxen (NAPROSYN) 500 MG tablet Take 1 tablet (500 mg total) by mouth 2 (two) times daily. 08/14/17   Vanetta Mulders, MD  naproxen (NAPROSYN) 500 MG tablet Take 1 tablet (500 mg total) by mouth 2 (two) times daily. 09/29/17   Horton, Mayer Masker, MD    Family History Family History  Problem Relation Age of Onset  . COPD Unknown   . Hypertension Unknown   . Diabetes Unknown   . Cancer Unknown     Social History Social History   Tobacco Use  . Smoking status: Never Smoker  . Smokeless tobacco: Never Used  Substance Use Topics  . Alcohol use: No  . Drug use: No  Allergies   Patient has no known allergies.   Review of Systems Review of Systems  Musculoskeletal: Positive for back pain. Negative for neck pain.  Neurological: Negative for weakness and numbness.     Physical Exam Updated Vital Signs BP (!) 155/85 (BP Location: Left Arm)   Pulse 94   Temp 98.1 F (36.7 C) (Oral)   Resp 20   SpO2 98%   Physical Exam  Constitutional: She appears well-developed and well-nourished. No distress.  HENT:  Head: Normocephalic and atraumatic.  Eyes: Conjunctivae are normal.  Neck: Normal range of motion. Neck supple.  Cardiovascular: Normal rate, regular rhythm and intact distal pulses.  Pulmonary/Chest: Effort normal.  Musculoskeletal: She exhibits tenderness.  Tenderness to the left trapezius and left upper back without color change, swelling, or deformity.   Full range of motion in the left shoulder without noted difficulty.  No pain or tenderness over the shoulder  joint.  No pain, difficulty, or tenderness in the left elbow or wrist. No cervical tenderness.  Neurological: She is alert.  Sensation grossly intact to light touch through each of the nerve distributions of the bilateral upper extremities. Abduction and adduction of the fingers intact against resistance. Grip strength equal bilaterally. Supination and pronation intact against resistance. Strength 5/5 through the cardinal directions of the bilateral wrists. Strength 5/5 with flexion and extension of the bilateral elbows. Patient can touch the thumb to each one of the fingertips without difficulty.   Skin: Skin is warm and dry. Capillary refill takes less than 2 seconds. She is not diaphoretic. No pallor.  Psychiatric: She has a normal mood and affect. Her behavior is normal.  Nursing note and vitals reviewed.    ED Treatments / Results  Labs (all labs ordered are listed, but only abnormal results are displayed) Labs Reviewed - No data to display  EKG None  Radiology No results found.  Procedures Procedures (including critical care time)  Medications Ordered in ED Medications - No data to display   Initial Impression / Assessment and Plan / ED Course  I have reviewed the triage vital signs and the nursing notes.  Pertinent labs & imaging results that were available during my care of the patient were reviewed by me and considered in my medical decision making (see chart for details).     Patient presents with pain to the left upper back and shoulder.  Based on her exam, I do not think she would benefit from imaging at this time. The patient was given instructions for home care as well as return precautions. Patient voices understanding of these instructions, accepts the plan, and is comfortable with discharge.  Final Clinical Impressions(s) / ED Diagnoses   Final diagnoses:  Acute pain of left shoulder    ED Discharge Orders         Ordered    methocarbamol (ROBAXIN)  500 MG tablet  2 times daily     04/06/18 1815    HYDROcodone-acetaminophen (NORCO/VICODIN) 5-325 MG tablet  Every 6 hours PRN     04/06/18 1815    diclofenac sodium (VOLTAREN) 1 % GEL  4 times daily     04/06/18 1815    lidocaine (LIDODERM) 5 %  Every 24 hours     04/06/18 1816           Anselm Pancoast, PA-C 04/06/18 1841    , Hillard Danker, PA-C 04/06/18 1842    Gwyneth Sprout, MD 04/08/18 1641

## 2018-04-06 NOTE — ED Triage Notes (Signed)
Pt c/o left upper back pain and shoulder pain x 3 days. Pt denies injury. 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

## 2018-04-06 NOTE — Discharge Instructions (Addendum)
Take it easy, but do not lay around too much as this may make any stiffness worse.  Antiinflammatory medications: Take 600 mg of ibuprofen every 6 hours or 440 mg (over the counter dose) to 500 mg (prescription dose) of naproxen every 12 hours for the next 3 days. After this time, these medications may be used as needed for pain. Take these medications with food to avoid upset stomach. Choose only one of these medications, do not take them together. Acetaminophen (generic for Tylenol): Should you continue to have additional pain while taking the ibuprofen or naproxen, you may add in acetaminophen as needed. Your daily total maximum amount of acetaminophen from all sources should be limited to 4000mg /day for persons without liver problems, or 2000mg /day for those with liver problems. Vicodin: May take Vicodin (hydrocodone-acetaminophen) as needed for severe pain.  Do not drive or perform other dangerous activities while taking the Vicodin.  Please note that each pill of Vicodin contains 325 mg of acetaminophen (Tylenol) and the above dosage limits apply. Muscle relaxer: Robaxin is a muscle relaxer and may help loosen stiff muscles. Do not take the Robaxin while driving or performing other dangerous activities.  Lidocaine patches: These are available via either prescription or over-the-counter. The over-the-counter option may be more economical one and are likely just as effective. There are multiple over-the-counter brands, such as Salonpas. Diclofenac gel: May apply the diclofenac gel to the painful area. Exercises: Be sure to perform the attached exercises starting with three times a week and working up to performing them daily. This is an essential part of preventing long term problems.  Follow up: Follow up with a primary care provider for any future management of these complaints. Be sure to follow up within 7-10 days. Return: Return to the ED should symptoms worsen.  For prescription assistance, may  try using prescription discount sites or apps, such as goodrx.com

## 2018-05-05 ENCOUNTER — Emergency Department (HOSPITAL_BASED_OUTPATIENT_CLINIC_OR_DEPARTMENT_OTHER)
Admission: EM | Admit: 2018-05-05 | Discharge: 2018-05-05 | Disposition: A | Discharge status: Home / Self Care | Payer: Self-pay | Attending: Emergency Medicine | Admitting: Emergency Medicine

## 2018-05-05 ENCOUNTER — Emergency Department (HOSPITAL_BASED_OUTPATIENT_CLINIC_OR_DEPARTMENT_OTHER): Payer: Self-pay

## 2018-05-05 ENCOUNTER — Encounter (HOSPITAL_BASED_OUTPATIENT_CLINIC_OR_DEPARTMENT_OTHER): Payer: Self-pay

## 2018-05-05 ENCOUNTER — Other Ambulatory Visit: Payer: Self-pay

## 2018-05-05 DIAGNOSIS — M546 Pain in thoracic spine: Secondary | ICD-10-CM | POA: Insufficient documentation

## 2018-05-05 DIAGNOSIS — Z79899 Other long term (current) drug therapy: Secondary | ICD-10-CM | POA: Insufficient documentation

## 2018-05-05 DIAGNOSIS — I10 Essential (primary) hypertension: Secondary | ICD-10-CM | POA: Insufficient documentation

## 2018-05-05 LAB — CBC WITH DIFFERENTIAL/PLATELET
ABS IMMATURE GRANULOCYTES: 0.03 10*3/uL (ref 0.00–0.07)
BASOS ABS: 0 10*3/uL (ref 0.0–0.1)
Basophils Relative: 0 %
EOS ABS: 0 10*3/uL (ref 0.0–0.5)
Eosinophils Relative: 0 %
HCT: 38.8 % (ref 36.0–46.0)
Hemoglobin: 12.3 g/dL (ref 12.0–15.0)
IMMATURE GRANULOCYTES: 0 %
LYMPHS ABS: 1.8 10*3/uL (ref 0.7–4.0)
Lymphocytes Relative: 25 %
MCH: 27.3 pg (ref 26.0–34.0)
MCHC: 31.7 g/dL (ref 30.0–36.0)
MCV: 86.2 fL (ref 80.0–100.0)
Monocytes Absolute: 0.6 10*3/uL (ref 0.1–1.0)
Monocytes Relative: 8 %
NEUTROS ABS: 4.8 10*3/uL (ref 1.7–7.7)
NEUTROS PCT: 67 %
NRBC: 0 % (ref 0.0–0.2)
PLATELETS: 335 10*3/uL (ref 150–400)
RBC: 4.5 MIL/uL (ref 3.87–5.11)
RDW: 12.9 % (ref 11.5–15.5)
WBC: 7.2 10*3/uL (ref 4.0–10.5)

## 2018-05-05 LAB — URINALYSIS, ROUTINE W REFLEX MICROSCOPIC
Bilirubin Urine: NEGATIVE
GLUCOSE, UA: NEGATIVE mg/dL
Hgb urine dipstick: NEGATIVE
Ketones, ur: 15 mg/dL — AB
Leukocytes, UA: NEGATIVE
Nitrite: NEGATIVE
PH: 6 (ref 5.0–8.0)
PROTEIN: NEGATIVE mg/dL
SPECIFIC GRAVITY, URINE: 1.02 (ref 1.005–1.030)

## 2018-05-05 LAB — COMPREHENSIVE METABOLIC PANEL
ALBUMIN: 3.7 g/dL (ref 3.5–5.0)
ALT: 22 U/L (ref 0–44)
ANION GAP: 8 (ref 5–15)
AST: 24 U/L (ref 15–41)
Alkaline Phosphatase: 69 U/L (ref 38–126)
BILIRUBIN TOTAL: 0.3 mg/dL (ref 0.3–1.2)
BUN: 11 mg/dL (ref 6–20)
CO2: 26 mmol/L (ref 22–32)
Calcium: 8.9 mg/dL (ref 8.9–10.3)
Chloride: 98 mmol/L (ref 98–111)
Creatinine, Ser: 0.61 mg/dL (ref 0.44–1.00)
Glucose, Bld: 121 mg/dL — ABNORMAL HIGH (ref 70–99)
POTASSIUM: 3.7 mmol/L (ref 3.5–5.1)
Sodium: 132 mmol/L — ABNORMAL LOW (ref 135–145)
TOTAL PROTEIN: 8.8 g/dL — AB (ref 6.5–8.1)

## 2018-05-05 LAB — PREGNANCY, URINE: Preg Test, Ur: NEGATIVE

## 2018-05-05 MED ORDER — KETOROLAC TROMETHAMINE 15 MG/ML IJ SOLN
15.0000 mg | Freq: Once | INTRAMUSCULAR | Status: AC
  Administered 2018-05-05: 15 mg via INTRAVENOUS
  Filled 2018-05-05: qty 1

## 2018-05-05 MED ORDER — CYCLOBENZAPRINE HCL 10 MG PO TABS: 10.0000 mg | ORAL_TABLET | Freq: Two times a day (BID) | ORAL | 0 refills | Status: DC | PRN

## 2018-05-05 MED ORDER — CYCLOBENZAPRINE HCL 10 MG PO TABS
10.0000 mg | ORAL_TABLET | Freq: Once | ORAL | Status: AC
  Administered 2018-05-05: 10 mg via ORAL
  Filled 2018-05-05: qty 1

## 2018-05-05 MED ORDER — NAPROXEN 500 MG PO TABS: 500.0000 mg | ORAL_TABLET | Freq: Two times a day (BID) | ORAL | 0 refills | Status: DC

## 2018-05-05 NOTE — Discharge Instructions (Signed)
Avoid heavy lifting.  Use your lidocaine patches as well

## 2018-05-05 NOTE — ED Triage Notes (Signed)
C/o left flank pain, freq urination x today-NAD-steady gait

## 2018-05-05 NOTE — ED Notes (Signed)
Pt teaching provided on medications that may cause drowsiness. Pt instructed not to drive or operate heavy machinery while taking the prescribed medication. Pt verbalized understanding.   

## 2018-05-06 NOTE — ED Provider Notes (Signed)
[facility] EMERGENCY DEPARTMENT Provider Note   CSN: [id number] Arrival date & time: 05/05/18  1807     History   Chief Complaint Chief Complaint  Patient presents with  . Flank Pain    HPI Amy [name] is a 42 y.o. female.  Patient is a 42 year old female with history of anemia and hypertension presenting today with sudden onset of left side pain.  She states she felt pretty good when she woke up this morning but when she got to work is when the pain started.  It does not radiate but is slightly worse when she takes a deep breath and when she bends or twists a certain way.  She has no lower abdominal pain or urinary symptoms.  No vaginal discharge and she does not have menses due to a endometrial ablation.  She denies cough, fever or congestion.  She has had no diarrhea today.  The history is provided by the patient.  Flank Pain  This is a new problem. The current episode started 6 to 12 hours ago. The problem occurs constantly. The problem has not changed since onset.Associated symptoms comments: Left side. The symptoms are aggravated by bending and twisting. Nothing relieves the symptoms. She has tried nothing for the symptoms. The treatment provided no relief.    Past Medical History:  Diagnosis Date  . Anemia   . Hypertension     Patient Active Problem List   Diagnosis Date Noted  . Excessive or frequent menstruation 09/10/2012    Past Surgical History:  Procedure Laterality Date  . CHOLECYSTECTOMY  2006  . DILITATION & CURRETTAGE/HYSTROSCOPY WITH HYDROTHERMAL ABLATION N/A 07/10/2013   Procedure: DILATATION & CURETTAGE/HYSTEROSCOPY WITH HYDROTHERMAL ABLATION;  Surgeon: Brock Bad, MD;  Location: [facility];  Service: Gynecology;  Laterality: N/A;  . TUBAL LIGATION  1997  . WISDOM TOOTH EXTRACTION  1998     OB History    Gravida  2   Para  2   Term  2   Preterm      AB      Living  2     SAB      TAB      Ectopic      Multiple       Live Births  2            Home Medications    Prior to Admission medications   Medication Sig Start Date End Date Taking? Authorizing Provider  cyclobenzaprine (FLEXERIL) 10 MG tablet Take 1 tablet (10 mg total) by mouth 2 (two) times daily as needed for muscle spasms. 05/05/18   Gwyneth Sprout, MD  diclofenac sodium (VOLTAREN) 1 % GEL Apply 4 g topically 4 (four) times daily. 04/06/18   Joy, Shawn C, PA-C  hydrochlorothiazide (HYDRODIURIL) 12.5 MG tablet Take 1 tablet (12.5 mg total) by mouth daily. 06/17/15   Eber Hong, MD  HYDROcodone-acetaminophen (NORCO/VICODIN) 5-325 MG tablet Take 1 tablet by mouth every 6 (six) hours as needed for severe pain. 04/06/18   Joy, Shawn C, PA-C  ibuprofen (ADVIL,MOTRIN) 800 MG tablet Take 1 tablet (800 mg total) by mouth every 8 (eight) hours as needed. 09/07/17   Long, Arlyss Repress, MD  lidocaine (LIDODERM) 5 % Place 1 patch onto the skin daily. Remove & Discard patch within 12 hours or as directed by MD 04/06/18   Joy, Shawn C, PA-C  methocarbamol (ROBAXIN) 500 MG tablet Take 1 tablet (500 mg total) by mouth 2 (two) times daily. 04/06/18  Joy, Shawn C, PA-C  naproxen (NAPROSYN) 500 MG tablet Take 1 tablet (500 mg total) by mouth 2 (two) times daily. 05/05/18   Gwyneth SproutPlunkett, Daeshaun Specht, MD    Family History Family History  Problem Relation Age of Onset  . COPD Unknown   . Hypertension Unknown   . Diabetes Unknown   . Cancer Unknown     Social History Social History   Tobacco Use  . Smoking status: Never Smoker  . Smokeless tobacco: Never Used  Substance Use Topics  . Alcohol use: No  . Drug use: No     Allergies   Patient has no known allergies.   Review of Systems Review of Systems  Genitourinary: Positive for flank pain.  All other systems reviewed and are negative.    Physical Exam Updated Vital Signs BP (!) 165/106 (BP Location: Right Arm)   Pulse 83   Temp 98.9 F (37.2 C) (Oral)   Resp 20   Ht 5\' 2"  (1.575 m)    Wt 98.4 kg   SpO2 96%   BMI 39.69 kg/m   Physical Exam  Constitutional: She is oriented to person, place, and time. She appears well-developed and well-nourished. No distress.  HENT:  Head: Normocephalic and atraumatic.  Mouth/Throat: Oropharynx is clear and moist.  Eyes: Pupils are equal, round, and reactive to light. Conjunctivae and EOM are normal.  Neck: Normal range of motion. Neck supple.  Cardiovascular: Normal rate, regular rhythm and intact distal pulses.  No murmur heard. Pulmonary/Chest: Effort normal and breath sounds normal. No respiratory distress. She has no wheezes. She has no rales.  Abdominal: Soft. She exhibits no distension. There is no tenderness. There is CVA tenderness. There is no rebound and no guarding.  Musculoskeletal: Normal range of motion. She exhibits tenderness. She exhibits no edema.       Thoracic back: She exhibits tenderness, pain and spasm. She exhibits no bony tenderness.       Back:  Neurological: She is alert and oriented to person, place, and time.  Skin: Skin is warm and dry. No rash noted. No erythema.  Psychiatric: She has a normal mood and affect. Her behavior is normal.  Nursing note and vitals reviewed.    ED Treatments / Results  Labs (all labs ordered are listed, but only abnormal results are displayed) Labs Reviewed  URINALYSIS, ROUTINE W REFLEX MICROSCOPIC - Abnormal; Notable for the following components:      Result Value   Ketones, ur 15 (*)    All other components within normal limits  COMPREHENSIVE METABOLIC PANEL - Abnormal; Notable for the following components:   Sodium 132 (*)    Glucose, Bld 121 (*)    Total Protein 8.8 (*)    All other components within normal limits  PREGNANCY, URINE  CBC WITH DIFFERENTIAL/PLATELET    EKG None  Radiology Ct Renal Stone Study  Result Date: 05/05/2018 CLINICAL DATA:  Acute onset LEFT flank pain, vomiting and frequent urination today. History of cholecystectomy. EXAM: CT  ABDOMEN AND PELVIS WITHOUT CONTRAST TECHNIQUE: Multidetector CT imaging of the abdomen and pelvis was performed following the standard protocol without IV contrast. COMPARISON:  CT abdomen and pelvis May 07, 2017. FINDINGS: LOWER CHEST: Lung bases are clear. The visualized heart size is normal. No pericardial effusion. HEPATOBILIARY: Status post cholecystectomy. Normal non-contrast CT level. PANCREAS: Normal. SPLEEN: Normal. ADRENALS/URINARY TRACT: Kidneys are orthotopic, demonstrating normal size and morphology. No nephrolithiasis, hydronephrosis; limited assessment for renal masses by nonenhanced CT. The unopacified ureters  are normal in course and caliber. Urinary bladder is partially distended and unremarkable. Normal adrenal glands. STOMACH/BOWEL: The stomach, small and large bowel are normal in course and caliber without inflammatory changes, sensitivity decreased by lack of enteric contrast. Mild amount of retained large bowel stool. Mild descending colonic diverticulosis. Normal appendix. VASCULAR/LYMPHATIC: Aortoiliac vessels are normal in course and caliber. No lymphadenopathy by CT size criteria. REPRODUCTIVE: Lobule uterus compatible with leiomyomas, similar. OTHER: No intraperitoneal free fluid or free air. Phleboliths in the pelvis. MUSCULOSKELETAL: Non-acute.  Anterior abdominal wall scarring. IMPRESSION: 1. No nephrolithiasis, obstructive uropathy nor acute intra-abdominal/pelvic process. 2. Mild colonic diverticulosis. 3. Uterine fibroids. Electronically Signed   By: Awilda Metro M.D.   On: 05/05/2018 21:23    Procedures Procedures (including critical care time)  Medications Ordered in ED Medications  ketorolac (TORADOL) 15 MG/ML injection 15 mg (15 mg Intravenous Given 05/05/18 2039)  cyclobenzaprine (FLEXERIL) tablet 10 mg (10 mg Oral Given 05/05/18 2208)     Initial Impression / Assessment and Plan / ED Course  I have reviewed the triage vital signs and the nursing  notes.  Pertinent labs & imaging results that were available during my care of the patient were reviewed by me and considered in my medical decision making (see chart for details).     Patient presenting today with sudden onset of left flank pain.  She denies any urinary or vaginal symptoms.  Pain is slightly worse with taking a deep breath but she denies any shortness of breath.  Vital signs are reassuring except for hypertension which is an ongoing problem.  Patient is point tender with palpation over the left CVA and thoracic region.  She has no midline tenderness.  She denies any infectious symptoms.  She is neurovascularly intact.  CT is negative for renal stones and labs including CBC, CMP and UA are within normal limits.  Suspect most likely musculoskeletal in nature.  Patient given Flexeril and Naprosyn.  She has lidocaine patches at home.  Final Clinical Impressions(s) / ED Diagnoses   Final diagnoses:  Acute left-sided thoracic back pain    ED Discharge Orders         Ordered    cyclobenzaprine (FLEXERIL) 10 MG tablet  2 times daily PRN     05/05/18 2202    naproxen (NAPROSYN) 500 MG tablet  2 times daily     05/05/18 2202           Gwyneth Sprout, MD 05/06/18 [id number]

## 2018-05-13 ENCOUNTER — Other Ambulatory Visit (HOSPITAL_COMMUNITY): Payer: Self-pay | Admitting: *Deleted

## 2018-05-13 DIAGNOSIS — Z1231 Encounter for screening mammogram for malignant neoplasm of breast: Secondary | ICD-10-CM

## 2018-08-07 ENCOUNTER — Ambulatory Visit
Admission: RE | Admit: 2018-08-07 | Discharge: 2018-08-07 | Disposition: A | Discharge status: Home / Self Care | Payer: No Typology Code available for payment source | Source: Ambulatory Visit | Attending: Obstetrics and Gynecology | Admitting: Obstetrics and Gynecology

## 2018-08-07 ENCOUNTER — Encounter (HOSPITAL_COMMUNITY): Payer: Self-pay

## 2018-08-07 ENCOUNTER — Ambulatory Visit (HOSPITAL_COMMUNITY)
Admission: RE | Admit: 2018-08-07 | Discharge: 2018-08-07 | Disposition: A | Discharge status: Home / Self Care | Payer: Self-pay | Source: Ambulatory Visit | Attending: Obstetrics and Gynecology | Admitting: Obstetrics and Gynecology

## 2018-08-07 ENCOUNTER — Other Ambulatory Visit: Payer: Self-pay

## 2018-08-07 VITALS — BP 154/100 | Wt 231.0 lb

## 2018-08-07 DIAGNOSIS — Z1231 Encounter for screening mammogram for malignant neoplasm of breast: Secondary | ICD-10-CM

## 2018-08-07 DIAGNOSIS — Z1239 Encounter for other screening for malignant neoplasm of breast: Secondary | ICD-10-CM

## 2018-08-07 NOTE — Progress Notes (Signed)
No complaints today.   Pap Smear: Pap smear completed today. Last Pap smear was over 3 years ago at [facility] and normal per patient. Per patient has no history of an abnormal Pap smear. No Pap smear results are in Epic.  Physical exam: Breasts Breasts symmetrical. No skin abnormalities bilateral breasts. No nipple retraction bilateral breasts. No nipple discharge bilateral breasts. No lymphadenopathy. No lumps palpated bilateral breasts. No complaints of pain or tenderness on exam. Referred patient to the [facility] for a screening mammogram. Appointment scheduled for Thursday, August 07, 2018 at 1610.        Pelvic/Bimanual   Ext Genitalia No lesions, no swelling and no discharge observed on external genitalia.         Vagina Vagina pink and normal texture. No lesions or discharge observed in vagina.          Cervix Cervix is present. Cervix pink and of normal texture. No discharge observed.     Uterus Uterus is present and palpable. Uterus in normal position and normal size.        Adnexae Bilateral ovaries present and unable to palpate. No tenderness on palpation.         Rectovaginal No rectal exam completed today since patient had no rectal complaints. No skin abnormalities observed on exam.    Smoking History: Patient has never smoked.  Patient Navigation: Patient education provided. Access to services provided for patient through [facility] program.   Breast and Cervical Cancer Risk Assessment: Patient has a family history of her mother having breast cancer. Patient has no known genetic mutations or history of radiation treatment to the chest before age 30. Patient has no history of cervical dysplasia, immunocompromised, or DES exposure in-utero.  Risk Assessment    Risk Scores      08/07/2018   Last edited by: Lynnell Dike, LPN   5-year risk: 1.4 %   Lifetime risk: 14.4 %

## 2018-08-07 NOTE — Patient Instructions (Signed)
Explained breast self awareness with Amy [name]. Let patient know [facility] will cover Pap smears and HPV typing every 5 years unless has a history of abnormal Pap smears. Referred patient to the [facility] for a screening mammogram. Appointment scheduled for Thursday, August 07, 2018 at 1610. Patient aware of appointment and will be there. Let patient know will follow up with her within the next couple weeks with results of Pap smear by letter or phone. Informed patient that the Breast Center will follow-up with her within the next couple of weeks with results of mammogram by letter or phone. Amy [name] verbalized understanding.  Amy [name], Amy Maser, RN 3:21 PM

## 2018-08-11 LAB — CYTOLOGY - PAP
DIAGNOSIS: NEGATIVE
HPV (WINDOPATH): DETECTED — AB

## 2018-08-19 ENCOUNTER — Telehealth (HOSPITAL_COMMUNITY): Payer: Self-pay | Admitting: *Deleted

## 2018-08-19 NOTE — Telephone Encounter (Signed)
Telephoned patient at home number and left message to return call to [facility]. Need to advise patient of pap smear results.  

## 2018-08-19 NOTE — Telephone Encounter (Signed)
Patient returned call to [facility]. Advised patient of negative pap smear and HPV positive. Advised patient would repeat pap smear in one year. Patient voiced understanding.

## 2018-09-01 ENCOUNTER — Encounter (HOSPITAL_COMMUNITY): Payer: Self-pay | Admitting: *Deleted

## 2019-02-13 ENCOUNTER — Emergency Department (HOSPITAL_BASED_OUTPATIENT_CLINIC_OR_DEPARTMENT_OTHER)
Admission: EM | Admit: 2019-02-13 | Discharge: 2019-02-14 | Disposition: A | Discharge status: Home / Self Care | Payer: No Typology Code available for payment source | Attending: Emergency Medicine | Admitting: Emergency Medicine

## 2019-02-13 ENCOUNTER — Other Ambulatory Visit: Payer: Self-pay

## 2019-02-13 ENCOUNTER — Emergency Department (HOSPITAL_BASED_OUTPATIENT_CLINIC_OR_DEPARTMENT_OTHER): Payer: No Typology Code available for payment source

## 2019-02-13 DIAGNOSIS — R030 Elevated blood-pressure reading, without diagnosis of hypertension: Secondary | ICD-10-CM

## 2019-02-13 DIAGNOSIS — R112 Nausea with vomiting, unspecified: Secondary | ICD-10-CM | POA: Insufficient documentation

## 2019-02-13 DIAGNOSIS — R51 Headache: Secondary | ICD-10-CM | POA: Insufficient documentation

## 2019-02-13 DIAGNOSIS — Z79899 Other long term (current) drug therapy: Secondary | ICD-10-CM | POA: Insufficient documentation

## 2019-02-13 DIAGNOSIS — R519 Headache, unspecified: Secondary | ICD-10-CM

## 2019-02-13 DIAGNOSIS — I10 Essential (primary) hypertension: Secondary | ICD-10-CM | POA: Insufficient documentation

## 2019-02-13 MED ORDER — METOCLOPRAMIDE HCL 5 MG/ML IJ SOLN
10.0000 mg | Freq: Once | INTRAMUSCULAR | Status: AC
  Administered 2019-02-13: 10 mg via INTRAVENOUS
  Filled 2019-02-13: qty 2

## 2019-02-13 MED ORDER — SODIUM CHLORIDE 0.9 % IV BOLUS
500.0000 mL | Freq: Once | INTRAVENOUS | Status: AC
  Administered 2019-02-13: 500 mL via INTRAVENOUS

## 2019-02-13 MED ORDER — DIPHENHYDRAMINE HCL 50 MG/ML IJ SOLN
12.5000 mg | Freq: Once | INTRAMUSCULAR | Status: AC
  Administered 2019-02-13: 12.5 mg via INTRAVENOUS
  Filled 2019-02-13: qty 1

## 2019-02-13 MED ORDER — AMLODIPINE BESYLATE 5 MG PO TABS
5.0000 mg | ORAL_TABLET | Freq: Once | ORAL | Status: AC
  Administered 2019-02-13: 5 mg via ORAL
  Filled 2019-02-13: qty 1

## 2019-02-13 MED ORDER — AMLODIPINE BESYLATE 5 MG PO TABS: 5.0000 mg | ORAL_TABLET | Freq: Every day | ORAL | 0 refills | Status: DC

## 2019-02-13 NOTE — ED Triage Notes (Signed)
Presents with headache that began at 1415 today associated with nausea and vomting. The headache waxes and wanes but is constant. DEnies light and sound sensitivity. Denies weakness, blurred vision, numbness, tingling, slurred speech and facial droop. No arm drift. Denies feeling dizzy or off balance. Neurologically intact.

## 2019-02-13 NOTE — ED Provider Notes (Signed)
[facility] EMERGENCY DEPARTMENT Provider Note   CSN: [id number] Arrival date & time: 02/13/19  1949     History   Chief Complaint Chief Complaint  Patient presents with  . Headache    HPI Amy [name] is a 43 y.o. female.     The history is provided by the patient and medical records. No language interpreter was used.  Headache  Amy [name] is a 43 y.o. female who presents to the Emergency Department complaining of HA.  She complains of headache that started today at 230pm while at work.  Headache is located in the frontal region and feels like sinus pressure.  Pain waxes and wanes.  She has associated vomiting.  Pain is better with closing her eyes.  No known covid 19 exposures.  Works as a [employer] manager.  Has a remote hx/o migraine but none in 12 years.  Has a hx/o HTN but is not currently on meds, no current PCP.   Past Medical History:  Diagnosis Date  . Anemia   . Hypertension     Patient Active Problem List   Diagnosis Date Noted  . Excessive or frequent menstruation 09/10/2012    Past Surgical History:  Procedure Laterality Date  . CHOLECYSTECTOMY  2006  . DILITATION & CURRETTAGE/HYSTROSCOPY WITH HYDROTHERMAL ABLATION N/A 07/10/2013   Procedure: DILATATION & CURETTAGE/HYSTEROSCOPY WITH HYDROTHERMAL ABLATION;  Surgeon: Shelly Bombard, MD;  Location: [facility];  Service: Gynecology;  Laterality: N/A;  . TUBAL LIGATION  1997  . WISDOM TOOTH EXTRACTION  1998     OB History    Gravida  2   Para  2   Term  2   Preterm      AB      Living  2     SAB      TAB      Ectopic      Multiple      Live Births  2            Home Medications    Prior to Admission medications   Medication Sig Start Date End Date Taking? Authorizing Provider  amLODipine (NORVASC) 5 MG tablet Take 1 tablet (5 mg total) by mouth daily. 02/13/19   Quintella Reichert, MD  cyclobenzaprine (FLEXERIL) 10 MG tablet Take 1 tablet (10 mg total) by mouth 2 (two)  times daily as needed for muscle spasms. Patient not taking: Reported on 08/07/2018 05/05/18   Blanchie Dessert, MD  diclofenac sodium (VOLTAREN) 1 % GEL Apply 4 g topically 4 (four) times daily. Patient not taking: Reported on 08/07/2018 04/06/18   Joy, Helane Gunther, PA-C  hydrochlorothiazide (HYDRODIURIL) 12.5 MG tablet Take 1 tablet (12.5 mg total) by mouth daily. Patient not taking: Reported on 08/07/2018 06/17/15   Noemi Chapel, MD  HYDROcodone-acetaminophen (NORCO/VICODIN) 5-325 MG tablet Take 1 tablet by mouth every 6 (six) hours as needed for severe pain. Patient not taking: Reported on 08/07/2018 04/06/18   Joy, Helane Gunther, PA-C  ibuprofen (ADVIL,MOTRIN) 800 MG tablet Take 1 tablet (800 mg total) by mouth every 8 (eight) hours as needed. Patient not taking: Reported on 08/07/2018 09/07/17   Long, Wonda Olds, MD  lidocaine (LIDODERM) 5 % Place 1 patch onto the skin daily. Remove & Discard patch within 12 hours or as directed by MD Patient not taking: Reported on 08/07/2018 04/06/18   Joy, Helane Gunther, PA-C  methocarbamol (ROBAXIN) 500 MG tablet Take 1 tablet (500 mg total) by mouth 2 (two) times daily.  Patient not taking: Reported on 08/07/2018 04/06/18   Joy, Ines Bloomer C, PA-C  naproxen (NAPROSYN) 500 MG tablet Take 1 tablet (500 mg total) by mouth 2 (two) times daily. Patient not taking: Reported on 08/07/2018 05/05/18   Gwyneth Sprout, MD    Family History Family History  Problem Relation Age of Onset  . Breast cancer Mother   . Hypertension Mother   . COPD Other   . Hypertension Other   . Diabetes Other   . Cancer Other     Social History Social History   Tobacco Use  . Smoking status: Never Smoker  . Smokeless tobacco: Never Used  Substance Use Topics  . Alcohol use: No  . Drug use: No     Allergies   Patient has no known allergies.   Review of Systems Review of Systems  Neurological: Positive for headaches.  All other systems reviewed and are negative.    Physical Exam  Updated Vital Signs BP (!) 190/122 (BP Location: Right Arm)   Pulse 87   Temp 98.9 F (37.2 C) (Oral)   Resp 18   Ht 5\' 2"  (1.575 m)   Wt 106.1 kg   SpO2 97%   BMI 42.80 kg/m   Physical Exam Vitals signs and nursing note reviewed.  Constitutional:      Appearance: She is well-developed.  HENT:     Head: Normocephalic and atraumatic.     Nose: Nose normal.  Eyes:     Extraocular Movements: Extraocular movements intact.     Pupils: Pupils are equal, round, and reactive to light.  Cardiovascular:     Rate and Rhythm: Normal rate and regular rhythm.     Heart sounds: No murmur.  Pulmonary:     Effort: Pulmonary effort is normal. No respiratory distress.     Breath sounds: Normal breath sounds.  Abdominal:     Palpations: Abdomen is soft.     Tenderness: There is no abdominal tenderness. There is no guarding or rebound.  Musculoskeletal:        General: No tenderness.  Skin:    General: Skin is warm and dry.  Neurological:     Mental Status: She is alert and oriented to person, place, and time.     Cranial Nerves: No cranial nerve deficit.     Comments: 5/5 strength in all four extremities with sensation to light touch intact in all four extremities  Psychiatric:        Behavior: Behavior normal.      ED Treatments / Results  Labs (all labs ordered are listed, but only abnormal results are displayed) Labs Reviewed - No data to display  EKG None  Radiology Ct Head Wo Contrast  Result Date: 02/13/2019 CLINICAL DATA:  Headache EXAM: CT HEAD WITHOUT CONTRAST TECHNIQUE: Contiguous axial images were obtained from the base of the skull through the vertex without intravenous contrast. COMPARISON:  None. FINDINGS: Brain: No evidence of acute infarction, hemorrhage, hydrocephalus, extra-axial collection or mass lesion/mass effect. Vascular: No hyperdense vessel or unexpected calcification. Skull: Normal. Negative for fracture or focal lesion. Sinuses/Orbits: No acute  finding. Other: None IMPRESSION: Negative non contrasted CT appearance of the brain Electronically Signed   By: Jasmine Pang M.D.   On: 02/13/2019 22:51    Procedures Procedures (including critical care time)  Medications Ordered in ED Medications  metoCLOPramide (REGLAN) injection 10 mg (10 mg Intravenous Given 02/13/19 2218)  sodium chloride 0.9 % bolus 500 mL (0 mLs Intravenous Stopped 02/13/19 2331)  diphenhydrAMINE (BENADRYL) injection 12.5 mg (12.5 mg Intravenous Given 02/13/19 2218)  amLODipine (NORVASC) tablet 5 mg (5 mg Oral Given 02/13/19 2359)     Initial Impression / Assessment and Plan / ED Course  I have reviewed the triage vital signs and the nursing notes.  Pertinent labs & imaging results that were available during my care of the patient were reviewed by me and considered in my medical decision making (see chart for details).        Patient here for evaluation of headache, vomiting. She is non-toxic appearing on evaluation with no focal neurologic deficits. She is noted to be hypertensive on ED arrival. She states that she has a history of hypertension in the past but is no longer on any medications. She does not currently have a PCP. Imaging is negative for acute abnormality. Presentation is not consistent with symptomatic aneurysm, subarachnoid hemorrhage, meningitis. Following treatment in the emergency department with medications her headache has completely resolved. After resolution of her headache she does have ongoing hypertension. Presentation is not consistent with hypertensive urgency in setting of resolved headache with elevated blood pressure. Suspect the patient has ongoing hypertension that is untreated. Will start amlodipine. Discussed with patient importance of establishing PCP follow-up as well as close return precautions. Final Clinical Impressions(s) / ED Diagnoses   Final diagnoses:  Bad headache  Elevated blood pressure reading    ED Discharge Orders          Ordered    amLODipine (NORVASC) 5 MG tablet  Daily     02/13/19 2351           Tilden Fossaees, Olia Hinderliter, MD 02/13/19 2359

## 2019-02-14 NOTE — ED Notes (Signed)
Provider aware of elevated blood pressure. Medication given. Will reassess in 15 minutes.

## 2019-03-12 ENCOUNTER — Other Ambulatory Visit: Payer: Self-pay

## 2019-03-12 ENCOUNTER — Ambulatory Visit: Payer: Self-pay | Attending: Family Medicine | Admitting: Family Medicine

## 2019-03-12 ENCOUNTER — Encounter: Payer: Self-pay | Admitting: Family Medicine

## 2019-03-12 VITALS — BP 148/94 | HR 86 | Temp 98.3°F | Ht 62.0 in | Wt 233.7 lb

## 2019-03-12 DIAGNOSIS — E119 Type 2 diabetes mellitus without complications: Secondary | ICD-10-CM

## 2019-03-12 DIAGNOSIS — R739 Hyperglycemia, unspecified: Secondary | ICD-10-CM

## 2019-03-12 DIAGNOSIS — I10 Essential (primary) hypertension: Secondary | ICD-10-CM

## 2019-03-12 DIAGNOSIS — Z77120 Contact with and (suspected) exposure to mold (toxic): Secondary | ICD-10-CM

## 2019-03-12 DIAGNOSIS — R350 Frequency of micturition: Secondary | ICD-10-CM

## 2019-03-12 DIAGNOSIS — N3000 Acute cystitis without hematuria: Secondary | ICD-10-CM

## 2019-03-12 LAB — POCT URINALYSIS DIP (CLINITEK)
Bilirubin, UA: NEGATIVE
Blood, UA: NEGATIVE
Glucose, UA: NEGATIVE mg/dL
Ketones, POC UA: NEGATIVE mg/dL
Nitrite, UA: NEGATIVE
Spec Grav, UA: 1.025
Urobilinogen, UA: 1 U/dL
pH, UA: 6

## 2019-03-12 LAB — POCT GLYCOSYLATED HEMOGLOBIN (HGB A1C): Hemoglobin A1C: 6.7 % — AB (ref 4.0–5.6)

## 2019-03-12 MED ORDER — LOSARTAN POTASSIUM-HCTZ 50-12.5 MG PO TABS: 1.0000 | ORAL_TABLET | Freq: Every day | ORAL | 3 refills | Status: DC

## 2019-03-12 MED ORDER — SULFAMETHOXAZOLE-TRIMETHOPRIM 800-160 MG PO TABS: 1.0000 | ORAL_TABLET | Freq: Two times a day (BID) | ORAL | 0 refills | Status: DC

## 2019-03-12 MED FILL — SULFAMETHOXAZOLE-TMP DS TAB: 800-160 | 3 days supply | Qty: 6 | Fill #0

## 2019-03-12 MED FILL — LOSARTAN-HCTZ 50-12.5 MG TA: 50-12.5 | 30 days supply | Qty: 30 | Fill #0

## 2019-03-12 NOTE — Patient Instructions (Signed)
Hypertension, Adult Hypertension is another name for high blood pressure. High blood pressure forces your heart to work harder to pump blood. This can cause problems over time. There are two numbers in a blood pressure reading. There is a top number (systolic) over a bottom number (diastolic). It is best to have a blood pressure that is below 120/80. Healthy choices can help lower your blood pressure, or you may need medicine to help lower it. What are the causes? The cause of this condition is not known. Some conditions may be related to high blood pressure. What increases the risk?  Smoking.  Having type 2 diabetes mellitus, high cholesterol, or both.  Not getting enough exercise or physical activity.  Being overweight.  Having too much fat, sugar, calories, or salt (sodium) in your diet.  Drinking too much alcohol.  Having long-term (chronic) kidney disease.  Having a family history of high blood pressure.  Age. Risk increases with age.  Race. You may be at higher risk if you are African American.  Gender. Men are at higher risk than women before age 45. After age 65, women are at higher risk than men.  Having obstructive sleep apnea.  Stress. What are the signs or symptoms?  High blood pressure may not cause symptoms. Very high blood pressure (hypertensive crisis) may cause: ? Headache. ? Feelings of worry or nervousness (anxiety). ? Shortness of breath. ? Nosebleed. ? A feeling of being sick to your stomach (nausea). ? Throwing up (vomiting). ? Changes in how you see. ? Very bad chest pain. ? Seizures. How is this treated?  This condition is treated by making healthy lifestyle changes, such as: ? Eating healthy foods. ? Exercising more. ? Drinking less alcohol.  Your health care provider may prescribe medicine if lifestyle changes are not enough to get your blood pressure under control, and if: ? Your top number is above 130. ? Your bottom number is above  80.  Your personal target blood pressure may vary. Follow these instructions at home: Eating and drinking   If told, follow the DASH eating plan. To follow this plan: ? Fill one half of your plate at each meal with fruits and vegetables. ? Fill one fourth of your plate at each meal with whole grains. Whole grains include whole-wheat pasta, brown rice, and whole-grain bread. ? Eat or drink low-fat dairy products, such as skim milk or low-fat yogurt. ? Fill one fourth of your plate at each meal with low-fat (lean) proteins. Low-fat proteins include fish, chicken without skin, eggs, beans, and tofu. ? Avoid fatty meat, cured and processed meat, or chicken with skin. ? Avoid pre-made or processed food.  Eat less than 1,500 mg of salt each day.  Do not drink alcohol if: ? Your doctor tells you not to drink. ? You are pregnant, may be pregnant, or are planning to become pregnant.  If you drink alcohol: ? Limit how much you use to:  0-1 drink a day for women.  0-2 drinks a day for men. ? Be aware of how much alcohol is in your drink. In the [place] one drink equals one 12 oz bottle of beer (355 mL), one 5 oz glass of wine (148 mL), or one 1 oz glass of hard liquor (44 mL). Lifestyle   Work with your doctor to stay at a healthy weight or to lose weight. Ask your doctor what the best weight is for you.  Get at least 30 minutes of exercise most   days of the week. This may include walking, swimming, or biking.  Get at least 30 minutes of exercise that strengthens your muscles (resistance exercise) at least 3 days a week. This may include lifting weights or doing Pilates.  Do not use any products that contain nicotine or tobacco, such as cigarettes, e-cigarettes, and chewing tobacco. If you need help quitting, ask your doctor.  Check your blood pressure at home as told by your doctor.  Keep all follow-up visits as told by your doctor. This is important. Medicines  Take over-the-counter  and prescription medicines only as told by your doctor. Follow directions carefully.  Do not skip doses of blood pressure medicine. The medicine does not work as well if you skip doses. Skipping doses also puts you at risk for problems.  Ask your doctor about side effects or reactions to medicines that you should watch for. Contact a doctor if you:  Think you are having a reaction to the medicine you are taking.  Have headaches that keep coming back (recurring).  Feel dizzy.  Have swelling in your ankles.  Have trouble with your vision. Get help right away if you:  Get a very bad headache.  Start to feel mixed up (confused).  Feel weak or numb.  Feel faint.  Have very bad pain in your: ? Chest. ? Belly (abdomen).  Throw up more than once.  Have trouble breathing. Summary  Hypertension is another name for high blood pressure.  High blood pressure forces your heart to work harder to pump blood.  For most people, a normal blood pressure is less than 120/80.  Making healthy choices can help lower blood pressure. If your blood pressure does not get lower with healthy choices, you may need to take medicine. This information is not intended to replace advice given to you by your health care provider. Make sure you discuss any questions you have with your health care provider. Document Released: 10/31/2007 Document Revised: 01/22/2018 Document Reviewed: 01/22/2018 Elsevier Patient Education  2020 Elsevier Inc.  Preventing Type 2 Diabetes Mellitus Type 2 diabetes (type 2 diabetes mellitus) is a long-term (chronic) disease that affects blood sugar (glucose) levels. Normally, a hormone called insulin allows glucose to enter cells in the body. The cells use glucose for energy. In type 2 diabetes, one or both of these problems may be present:  The body does not make enough insulin.  The body does not respond properly to insulin that it makes (insulin resistance). Insulin  resistance or lack of insulin causes excess glucose to build up in the blood instead of going into cells. As a result, high blood glucose (hyperglycemia) develops, which can cause many complications. Being overweight or obese and having an inactive (sedentary) lifestyle can increase your risk for diabetes. Type 2 diabetes can be delayed or prevented by making certain nutrition and lifestyle changes. What nutrition changes can be made?   Eat healthy meals and snacks regularly. Keep a healthy snack with you for when you get hungry between meals, such as fruit or a handful of nuts.  Eat lean meats and proteins that are low in saturated fats, such as chicken, fish, egg whites, and beans. Avoid processed meats.  Eat plenty of fruits and vegetables and plenty of grains that have not been processed (whole grains). It is recommended that you eat: ? 1?2 cups of fruit every day. ? 2?3 cups of vegetables every day. ? 6?8 oz of whole grains every day, such as oats, whole wheat,   bulgur, brown rice, quinoa, and millet.  Eat low-fat dairy products, such as milk, yogurt, and cheese.  Eat foods that contain healthy fats, such as nuts, avocado, olive oil, and canola oil.  Drink water throughout the day. Avoid drinks that contain added sugar, such as soda or sweet tea.  Follow instructions from your health care provider about specific eating or drinking restrictions.  Control how much food you eat at a time (portion size). ? Check food labels to find out the serving sizes of foods. ? Use a kitchen scale to weigh amounts of foods.  Saute or steam food instead of frying it. Cook with water or broth instead of oils or butter.  Limit your intake of: ? Salt (sodium). Have no more than 1 tsp (2,400 mg) of sodium a day. If you have heart disease or high blood pressure, have less than ? tsp (1,500 mg) of sodium a day. ? Saturated fat. This is fat that is solid at room temperature, such as butter or fat on  meat. What lifestyle changes can be made? Activity   Do moderate-intensity physical activity for at least 30 minutes on at least 5 days of the week, or as much as told by your health care provider.  Ask your health care provider what activities are safe for you. A mix of physical activities may be best, such as walking, swimming, cycling, and strength training.  Try to add physical activity into your day. For example: ? Park in spots that are farther away than usual, so that you walk more. For example, park in a far corner of the parking lot when you go to the office or the grocery store. ? Take a walk during your lunch break. ? Use stairs instead of elevators or escalators. Weight Loss  Lose weight as directed. Your health care provider can determine how much weight loss is best for you and can help you lose weight safely.  If you are overweight or obese, you may be instructed to lose at least 5?7 % of your body weight. Alcohol and Tobacco   Limit alcohol intake to no more than 1 drink a day for nonpregnant women and 2 drinks a day for men. One drink equals 12 oz of beer, 5 oz of wine, or 1 oz of hard liquor.  Do not use any tobacco products, such as cigarettes, chewing tobacco, and e-cigarettes. If you need help quitting, ask your health care provider. Work With Your Health Care Provider  Have your blood glucose tested regularly, as told by your health care provider.  Discuss your risk factors and how you can reduce your risk for diabetes.  Get screening tests as told by your health care provider. You may have screening tests regularly, especially if you have certain risk factors for type 2 diabetes.  Make an appointment with a diet and nutrition specialist (registered dietitian). A registered dietitian can help you make a healthy eating plan and can help you understand portion sizes and food labels. Why are these changes important?  It is possible to prevent or delay type 2  diabetes and related health problems by making lifestyle and nutrition changes.  It can be difficult to recognize signs of type 2 diabetes. The best way to avoid possible damage to your body is to take actions to prevent the disease before you develop symptoms. What can happen if changes are not made?  Your blood glucose levels may keep increasing. Having high blood glucose for a long   time is dangerous. Too much glucose in your blood can damage your blood vessels, heart, kidneys, nerves, and eyes.  You may develop prediabetes or type 2 diabetes. Type 2 diabetes can lead to many chronic health problems and complications, such as: ? Heart disease. ? Stroke. ? Blindness. ? Kidney disease. ? Depression. ? Poor circulation in the feet and legs, which could lead to surgical removal (amputation) in severe cases. Where to find support  Ask your health care provider to recommend a registered dietitian, diabetes educator, or weight loss program.  Look for local or online weight loss groups.  Join a gym, fitness club, or outdoor activity group, such as a walking club. Where to find more information To learn more about diabetes and diabetes prevention, visit:  American Diabetes Association (ADA): www.diabetes.org  National Institute of Diabetes and Digestive and Kidney Diseases: www.niddk.nih.gov/health-information/diabetes To learn more about healthy eating, visit:  The [place].S. Department of Agriculture (USDA), Choose My Plate: www.choosemyplate.gov/food-groups  Office of Disease Prevention and Health Promotion (ODPHP), Dietary Guidelines: www.health.gov/dietaryguidelines Summary  You can reduce your risk for type 2 diabetes by increasing your physical activity, eating healthy foods, and losing weight as directed.  Talk with your health care provider about your risk for type 2 diabetes. Ask about any blood tests or screening tests that you need to have. This information is not intended to  replace advice given to you by your health care provider. Make sure you discuss any questions you have with your health care provider. Document Released: 09/05/2015 Document Revised: 09/05/2018 Document Reviewed: 07/05/2015 Elsevier Patient Education  2020 [employer]vier Inc.  

## 2019-03-12 NOTE — Progress Notes (Addendum)
Subjective:  Patient ID: Amy [name], female    DOB: 1975/07/31  Age: 43 y.o. MRN: [id number]  CC: Hypertension, Labs Only (wants to get tested for mold), Medication Management (stopped taking her Amlodipine due to it causing her to have anxiety, chest pains, rash), and Medication Management (would like to be back on HCTZ due to it working )   HPI Amy [name] presents to establish care as a new patient and is status post emergency department visit on 02/13/2019 secondary to complaint of a bad frontal headache.  Patient also with a history of hypertension but did not have any blood pressure medication as she did not have a primary care provider.  Patient also with remote history of migraines.  Patient's blood pressure in the emergency department was 190/122.  Prescription was provided for amlodipine 5 mg once daily.  On review of patient's chart, she had blood work done on 05/05/2018 with glucose of 121 and sodium of 132.  Complete blood count done at that time was normal.      Patient today states that she stopped taking the amlodipine a few days ago due to feeling anxious, increased heart rate sensation and mid to left chest pain.  She reports that her symptoms of anxiety, increased heart rate and chest pain resolved after she stopped taking the amlodipine.  She reports that she has taken hydrochlorothiazide in the past and she feels that this medication worked and she wonders if this medication can be prescribed for treatment of her hypertension.       She also reports that she has had recent onset of frequent urination.  She denies any increased back pain.  She does not feel as if she is having any abdominal pain, nausea and no fever or chills.  She does not have any past history of diabetes.        She reports that she has recently noticed that there is mold growing in her apartment and patient would like to be tested for exposure to mold.  She believes that this may have caused her  to have some nasal congestion.  She feels as if she has had some shortness of breath.  She denies any cough or wheezing.  Past Medical History:  Diagnosis Date  . Anemia   . Hypertension     Past Surgical History:  Procedure Laterality Date  . CHOLECYSTECTOMY  2006  . DILITATION & CURRETTAGE/HYSTROSCOPY WITH HYDROTHERMAL ABLATION N/A 07/10/2013   Procedure: DILATATION & CURETTAGE/HYSTEROSCOPY WITH HYDROTHERMAL ABLATION;  Surgeon: Shelly Bombard, MD;  Location: [facility];  Service: Gynecology;  Laterality: N/A;  . TUBAL LIGATION  1997  . WISDOM TOOTH EXTRACTION  1998    Family History  Problem Relation Age of Onset  . Breast cancer Mother   . Hypertension Mother   . COPD Other   . Hypertension Other   . Diabetes Other   . Cancer Other     Social History   Tobacco Use  . Smoking status: Never Smoker  . Smokeless tobacco: Never Used  Substance Use Topics  . Alcohol use: No    ROS Review of Systems  Constitutional: Positive for fatigue (mild). Negative for chills and fever.  HENT: Negative for sore throat and trouble swallowing.   Eyes: Positive for visual disturbance (needs glasses).  Respiratory: Positive for shortness of breath (due to mold exposure-occurs at home). Negative for cough and wheezing.   Cardiovascular: Negative for chest pain, palpitations and leg swelling.  Chest pain, increased HR resolved after stopping amlodipine  Gastrointestinal: Negative for abdominal pain, constipation, diarrhea and nausea.  Endocrine: Positive for polyuria. Negative for cold intolerance, heat intolerance, polydipsia and polyphagia.  Genitourinary: Positive for frequency. Negative for dysuria and flank pain.  Musculoskeletal: Negative for arthralgias, back pain and gait problem.  Skin: Negative for rash and wound.  Neurological: Negative for dizziness, numbness and headaches.  Hematological: Negative for adenopathy. Does not bruise/bleed easily.    Objective:   Today's  Vitals: BP (!) 148/94 (BP Location: Right Arm, Patient Position: Sitting, Cuff Size: Large)   Pulse 86   Temp 98.3 F (36.8 C) (Oral)   Ht 5\' 2"  (1.575 m)   Wt 233 lb 11 oz (106 kg)   BMI 42.74 kg/m   Physical Exam Vitals signs and nursing note reviewed.  Constitutional:      Appearance: Normal appearance. She is obese.  Neck:     Musculoskeletal: Normal range of motion and neck supple. No muscular tenderness.     Vascular: No carotid bruit.  Cardiovascular:     Rate and Rhythm: Normal rate and regular rhythm.  Pulmonary:     Effort: Pulmonary effort is normal.     Breath sounds: Normal breath sounds.  Abdominal:     Tenderness: There is no abdominal tenderness. There is no right CVA tenderness, left CVA tenderness, guarding or rebound.  Musculoskeletal:        General: No tenderness or deformity.     Right lower leg: No edema.     Left lower leg: No edema.  Lymphadenopathy:     Cervical: No cervical adenopathy.  Skin:    General: Skin is warm and dry.  Neurological:     General: No focal deficit present.     Mental Status: She is alert and oriented to person, place, and time.  Psychiatric:        Mood and Affect: Mood normal.        Behavior: Behavior normal.     Assessment & Plan:  1. Essential hypertension Patient with report of having increased heart rate, anxiety and chest pain with use of amlodipine but symptoms have resolved since she stopped taking the medication.  She is interested in being on hydrochlorothiazide which she took in the past without issues.  Patient also with elevated blood sugars on review of chart and patient had hemoglobin A1c at today's visit that was elevated at 6.7 consistent with diabetes.  Will place patient on losartan as therapy with ACE inhibitor or angiotensin receptor blocker recommended to help protect the kidneys from the effects of diabetes and hydrochlorothiazide added per patient's request.  Patient also given information as part of  AVS regarding hypertension and low-sodium/DASH diet.  She will follow-up in a few weeks for blood pressure recheck as well as for follow-up of new onset diabetes. - Basic Metabolic Panel - losartan-hydrochlorothiazide (HYZAAR) 50-12.5 MG tablet; Take 1 tablet by mouth daily. To lower blood pressure  Dispense: 30 tablet; Refill: 3  2. Elevated blood sugar On review of chart, patient has had elevated glucose on prior blood work including glucose of 121 in December 2019.  Patient will have hemoglobin A1c at today's visit due to her complaints of urinary frequency as well as prior elevated blood sugar.  Patient will also have BMP at today's visit. - HgB A1c - Basic Metabolic Panel  3. Urinary frequency Patient with complaint of urinary frequency and will have urinalysis done to look for possible urinary  tract infection as a cause of her urinary frequency and we will also check BMP to assess electrolytes and blood sugar.  She will also have hemoglobin A1c as she has had past labs with elevation in glucose on review of chart. - Basic Metabolic Panel - POCT URINALYSIS DIP (CLINITEK)  4. Suspected exposure to mold Patient reports that she has seen mold in her apartment and would like to have testing for mold.  Allergy panel for mold ordered as part of today's blood work. - Allergy Panel 11, [facility]  5. Newly diagnosed diabetes (HCC) Patient's hemoglobin A1c was elevated at 6.7 consistent with a diagnosis of diabetes.  Information was provided to the patient as part of her after visit summary on preventing type 2 diabetes.  Low carbohydrate diet and exercise encouraged.  Patient is to follow-up in a few weeks for hypertension as well as new diagnosis of diabetes.  In the meantime, patient will consider whether or not she wishes to start Metformin to help with insulin resistance.  6. Acute cystitis without hematuria Urinalysis showed trace leukocytes and patient will be placed on Septra DS twice daily  x3 days for treatment of presumptive UTI/cystitis.  Patient is encouraged to remain well-hydrated.  Call or return if continued urinary frequency, onset of abdominal pain/back pain or any other concerns. - sulfamethoxazole-trimethoprim (BACTRIM DS) 800-160 MG tablet; Take 1 tablet by mouth 2 (two) times daily.  Dispense: 6 tablet; Refill: 0   Outpatient Encounter Medications as of 03/12/2019  Medication Sig  . losartan-hydrochlorothiazide (HYZAAR) 50-12.5 MG tablet Take 1 tablet by mouth daily. To lower blood pressure  . sulfamethoxazole-trimethoprim (BACTRIM DS) 800-160 MG tablet Take 1 tablet by mouth 2 (two) times daily.  . [DISCONTINUED] amLODipine (NORVASC) 5 MG tablet Take 1 tablet (5 mg total) by mouth daily. (Patient not taking: Reported on 03/12/2019)  . [DISCONTINUED] cyclobenzaprine (FLEXERIL) 10 MG tablet Take 1 tablet (10 mg total) by mouth 2 (two) times daily as needed for muscle spasms. (Patient not taking: Reported on 08/07/2018)  . [DISCONTINUED] diclofenac sodium (VOLTAREN) 1 % GEL Apply 4 g topically 4 (four) times daily. (Patient not taking: Reported on 08/07/2018)  . [DISCONTINUED] hydrochlorothiazide (HYDRODIURIL) 12.5 MG tablet Take 1 tablet (12.5 mg total) by mouth daily. (Patient not taking: Reported on 08/07/2018)  . [DISCONTINUED] HYDROcodone-acetaminophen (NORCO/VICODIN) 5-325 MG tablet Take 1 tablet by mouth every 6 (six) hours as needed for severe pain. (Patient not taking: Reported on 08/07/2018)  . [DISCONTINUED] ibuprofen (ADVIL,MOTRIN) 800 MG tablet Take 1 tablet (800 mg total) by mouth every 8 (eight) hours as needed. (Patient not taking: Reported on 08/07/2018)  . [DISCONTINUED] lidocaine (LIDODERM) 5 % Place 1 patch onto the skin daily. Remove & Discard patch within 12 hours or as directed by MD (Patient not taking: Reported on 08/07/2018)  . [DISCONTINUED] methocarbamol (ROBAXIN) 500 MG tablet Take 1 tablet (500 mg total) by mouth 2 (two) times daily. (Patient not  taking: Reported on 08/07/2018)  . [DISCONTINUED] naproxen (NAPROSYN) 500 MG tablet Take 1 tablet (500 mg total) by mouth 2 (two) times daily. (Patient not taking: Reported on 08/07/2018)   No facility-administered encounter medications on file as of 03/12/2019.     An After Visit Summary was printed and given to the patient.   Follow-up: Return in about 2 weeks (around 03/26/2019) for HTN.    Cain Saupe MD

## 2019-03-13 LAB — BASIC METABOLIC PANEL WITH GFR
BUN/Creatinine Ratio: 11 (ref 9–23)
BUN: 9 mg/dL (ref 6–24)
CO2: 25 mmol/L (ref 20–29)
Calcium: 8.9 mg/dL (ref 8.7–10.2)
Chloride: 100 mmol/L (ref 96–106)
Creatinine, Ser: 0.81 mg/dL (ref 0.57–1.00)
GFR calc Af Amer: 101 mL/min/1.73
GFR calc non Af Amer: 88 mL/min/1.73
Glucose: 106 mg/dL — ABNORMAL HIGH (ref 65–99)
Potassium: 4 mmol/L (ref 3.5–5.2)
Sodium: 138 mmol/L (ref 134–144)

## 2019-03-14 LAB — ALLERGEN PROFILE, MOLD
Alternaria Alternata IgE: <0.1 kU/L
Aspergillus Fumigatus IgE: <0.1 kU/L
Aureobasidi Pullulans IgE: <0.1 kU/L
Candida Albicans IgE: <0.1 kU/L
Cladosporium Herbarum IgE: <0.1 kU/L
M009-IgE Fusarium proliferatum: <0.1 kU/L
M014-IgE Epicoccum purpur: <0.1 kU/L
Mucor Racemosus IgE: <0.1 kU/L
Penicillium Chrysogen IgE: <0.1 kU/L
Phoma Betae IgE: <0.1 kU/L
Setomelanomma Rostrat: <0.1 kU/L
Stemphylium Herbarum IgE: <0.1 kU/L

## 2019-03-26 ENCOUNTER — Other Ambulatory Visit: Payer: Self-pay

## 2019-03-26 ENCOUNTER — Encounter: Payer: Self-pay | Admitting: Family Medicine

## 2019-03-26 ENCOUNTER — Ambulatory Visit: Payer: Self-pay | Attending: Family Medicine | Admitting: Family Medicine

## 2019-03-26 DIAGNOSIS — I10 Essential (primary) hypertension: Secondary | ICD-10-CM

## 2019-03-26 DIAGNOSIS — E119 Type 2 diabetes mellitus without complications: Secondary | ICD-10-CM

## 2019-03-26 NOTE — Progress Notes (Signed)
Virtual Visit via Telephone Note  I connected with Amy [name] on 03/26/19 at 10:10 AM EDT by telephone and verified that I am speaking with the correct person using two identifiers.   I discussed the limitations, risks, security and privacy concerns of performing an evaluation and management service by telephone and the availability of in person appointments. I also discussed with the patient that there may be a patient responsible charge related to this service. The patient expressed understanding and agreed to proceed.  Patient Location: home Provider Location: [facility] Others participating in call: none   History of Present Illness:      43 year old female who is status post new patient visit on 03/12/2019 at which time she was seen to establish care following emergency department visit with elevated blood pressure and complaint of headaches.  She reports that the medications that she was recently placed on at her recent office visit has controlled her blood pressure and she denies any current issues with headaches or dizziness.  She reports that urinary frequency from her last visit has resolved after treatment for urinary tract infection.  She denies any current issues with increased thirst or urinary frequency but is aware that her hemoglobin A1c from her last visit was consistent with diagnosis of diabetes.  She states that overall she feels well at this time.  She intends to continue to make changes in her diet as well as exercise for weight loss and does not wish to be on medicine to help with diabetes/insulin resistance at this time.        On review of systems, she denies any headaches or dizziness, no changes in vision, no increased thirst and no urinary frequency.  She denies any dysuria, no low back pain.  She has had no issues with peripheral edema.  No increased muscle or joint pain.  No chest pain or palpitations, no shortness of breath or cough.  No recent fever or  chills.  No abdominal pain-no nausea or vomiting.         Past Medical History:  Diagnosis Date  . Anemia   . Hypertension     Past Surgical History:  Procedure Laterality Date  . CHOLECYSTECTOMY  2006  . DILITATION & CURRETTAGE/HYSTROSCOPY WITH HYDROTHERMAL ABLATION N/A 07/10/2013   Procedure: DILATATION & CURETTAGE/HYSTEROSCOPY WITH HYDROTHERMAL ABLATION;  Surgeon: Shelly Bombard, MD;  Location: [facility];  Service: Gynecology;  Laterality: N/A;  . TUBAL LIGATION  1997  . WISDOM TOOTH EXTRACTION  1998    Family History  Problem Relation Age of Onset  . Breast cancer Mother   . Hypertension Mother   . COPD Other   . Hypertension Other   . Diabetes Other   . Cancer Other     Social History   Tobacco Use  . Smoking status: Never Smoker  . Smokeless tobacco: Never Used  Substance Use Topics  . Alcohol use: No  . Drug use: No     Allergies  Allergen Reactions  . Hydrocodone-Acetaminophen Hives    And itching       Observations/Objective: No vital signs or physical exam conducted as visit was done via telephone  Assessment and Plan: 1. Essential hypertension She reports that she has tolerated losartan hydrochlorothiazide without difficulty and that her blood pressure is now controlled.  She will continue the current medication as well as low-sodium diet.  2. Newly diagnosed diabetes [facility]) Discussed with patient that at her recent visit on 03/12/2019 she had hemoglobin  A1c of 6.7 consistent with a diagnosis of diabetes.  Patient is not interested in being on medication to help with insulin resistance/type 2 diabetes at this time.  She wishes to try dietary changes and exercise in order to normalize her blood sugars.  Discussed signs and symptoms of elevated blood sugar including increased thirst and frequent urination as well as fatigue and poorly healing wounds and patient should return sooner if she notices that she is having the signs or symptoms.  Otherwise she will  follow-up in 3 months for repeat hemoglobin A1c, follow-up of diabetes and hypertension.  Follow Up Instructions:Return in about 3 months (around 06/26/2019) for HTN/DM-recheck Hgb A1c.    I discussed the assessment and treatment plan with the patient. The patient was provided an opportunity to ask questions and all were answered. The patient agreed with the plan and demonstrated an understanding of the instructions.   The patient was advised to call back or seek an in-person evaluation if the symptoms worsen or if the condition fails to improve as anticipated.  I provided 11 minutes of non-face-to-face time during this encounter.   Cain Saupe, MD

## 2019-03-26 NOTE — Progress Notes (Signed)
Patient verified DOB Patient has taken medication today. Patient has eaten today. Patient denies pain at this time. 

## 2019-04-10 MED FILL — LOSARTAN-HCTZ 50-12.5 MG TA: 50-12.5 | 30 days supply | Qty: 30 | Fill #1

## 2019-04-27 MED FILL — LOSARTAN-HCTZ 50-12.5 MG TA: 50-12.5 | 30 days supply | Qty: 30 | Fill #1

## 2019-06-25 MED FILL — LOSARTAN-HCTZ 50-12.5 MG TA: 50-12.5 | 30 days supply | Qty: 30 | Fill #2

## 2019-07-31 ENCOUNTER — Emergency Department (HOSPITAL_BASED_OUTPATIENT_CLINIC_OR_DEPARTMENT_OTHER): Payer: No Typology Code available for payment source

## 2019-07-31 ENCOUNTER — Emergency Department (HOSPITAL_BASED_OUTPATIENT_CLINIC_OR_DEPARTMENT_OTHER)
Admission: EM | Admit: 2019-07-31 | Discharge: 2019-07-31 | Disposition: A | Discharge status: Home / Self Care | Payer: No Typology Code available for payment source | Attending: Emergency Medicine | Admitting: Emergency Medicine

## 2019-07-31 ENCOUNTER — Encounter (HOSPITAL_BASED_OUTPATIENT_CLINIC_OR_DEPARTMENT_OTHER): Payer: Self-pay | Admitting: Emergency Medicine

## 2019-07-31 ENCOUNTER — Other Ambulatory Visit: Payer: Self-pay

## 2019-07-31 DIAGNOSIS — R739 Hyperglycemia, unspecified: Secondary | ICD-10-CM

## 2019-07-31 DIAGNOSIS — Z79899 Other long term (current) drug therapy: Secondary | ICD-10-CM | POA: Insufficient documentation

## 2019-07-31 DIAGNOSIS — Z885 Allergy status to narcotic agent status: Secondary | ICD-10-CM | POA: Insufficient documentation

## 2019-07-31 DIAGNOSIS — R0789 Other chest pain: Secondary | ICD-10-CM | POA: Insufficient documentation

## 2019-07-31 DIAGNOSIS — I10 Essential (primary) hypertension: Secondary | ICD-10-CM

## 2019-07-31 LAB — BASIC METABOLIC PANEL
Anion gap: 8 (ref 5–15)
BUN: 12 mg/dL (ref 6–20)
CO2: 28 mmol/L (ref 22–32)
Calcium: 9.2 mg/dL (ref 8.9–10.3)
Chloride: 99 mmol/L (ref 98–111)
Creatinine, Ser: 0.84 mg/dL (ref 0.44–1.00)
GFR calc Af Amer: >60 mL/min (ref >60)
GFR calc non Af Amer: >60 mL/min (ref >60)
Glucose, Bld: 198 mg/dL — ABNORMAL HIGH (ref 70–99)
Potassium: 3.5 mmol/L (ref 3.5–5.1)
Sodium: 135 mmol/L (ref 135–145)

## 2019-07-31 LAB — CBC
HCT: 39.4 % (ref 36.0–46.0)
Hemoglobin: 13.1 g/dL (ref 12.0–15.0)
MCH: 29 pg (ref 26.0–34.0)
MCHC: 33.2 g/dL (ref 30.0–36.0)
MCV: 87.4 fL (ref 80.0–100.0)
Platelets: 306 10*3/uL (ref 150–400)
RBC: 4.51 MIL/uL (ref 3.87–5.11)
RDW: 12.4 % (ref 11.5–15.5)
WBC: 6.1 10*3/uL (ref 4.0–10.5)
nRBC: 0 % (ref 0.0–0.2)

## 2019-07-31 LAB — TROPONIN I (HIGH SENSITIVITY): Troponin I (High Sensitivity): 10 ng/L (ref <18)

## 2019-07-31 MED ORDER — IBUPROFEN 600 MG PO TABS: 600.0000 mg | ORAL_TABLET | Freq: Four times a day (QID) | ORAL | 0 refills | Status: DC | PRN

## 2019-07-31 MED ORDER — KETOROLAC TROMETHAMINE 30 MG/ML IJ SOLN
30.0000 mg | Freq: Once | INTRAMUSCULAR | Status: AC
  Administered 2019-07-31: 30 mg via INTRAVENOUS
  Filled 2019-07-31: qty 1

## 2019-07-31 MED FILL — LOSARTAN-HCTZ 50-12.5 MG TA: 50-12.5 | 30 days supply | Qty: 30 | Fill #2

## 2019-07-31 MED FILL — IBUPROFEN 600 MG TABLET: 600 | 8 days supply | Qty: 30 | Fill #0

## 2019-07-31 NOTE — ED Provider Notes (Signed)
[facility] EMERGENCY DEPARTMENT Provider Note   CSN: [id number] Arrival date & time: 07/31/19  0813     History Chief Complaint  Patient presents with  . Shoulder Pain    Amy [name] is a 44 y.o. female.  Pt presents to the ED today with bilateral shoulder pain which is radiating into her neck and chest.  No trauma. No sob.  No n/v.  No f/c.  No cough.  Pain with movement.  She has taken nothing for her pain or her usual morning meds.         Past Medical History:  Diagnosis Date  . Anemia   . Hypertension     Patient Active Problem List   Diagnosis Date Noted  . Excessive or frequent menstruation 09/10/2012    Past Surgical History:  Procedure Laterality Date  . CHOLECYSTECTOMY  2006  . DILITATION & CURRETTAGE/HYSTROSCOPY WITH HYDROTHERMAL ABLATION N/A 07/10/2013   Procedure: DILATATION & CURETTAGE/HYSTEROSCOPY WITH HYDROTHERMAL ABLATION;  Surgeon: Brock Bad, MD;  Location: [facility];  Service: Gynecology;  Laterality: N/A;  . TUBAL LIGATION  1997  . WISDOM TOOTH EXTRACTION  1998     OB History    Gravida  2   Para  2   Term  2   Preterm      AB      Living  2     SAB      TAB      Ectopic      Multiple      Live Births  2           Family History  Problem Relation Age of Onset  . Breast cancer Mother   . Hypertension Mother   . COPD Other   . Hypertension Other   . Diabetes Other   . Cancer Other     Social History   Tobacco Use  . Smoking status: Never Smoker  . Smokeless tobacco: Never Used  Substance Use Topics  . Alcohol use: No  . Drug use: No    Home Medications Prior to Admission medications   Medication Sig Start Date End Date Taking? Authorizing Provider  ibuprofen (ADVIL) 600 MG tablet Take 1 tablet (600 mg total) by mouth every 6 (six) hours as needed. 07/31/19   Jacalyn Lefevre, MD  losartan-hydrochlorothiazide (HYZAAR) 50-12.5 MG tablet Take 1 tablet by mouth daily. To lower blood  pressure 03/12/19   Fulp, Cammie, MD    Allergies    Hydrocodone-acetaminophen  Review of Systems   Review of Systems  Cardiovascular: Positive for chest pain.  All other systems reviewed and are negative.   Physical Exam Updated Vital Signs BP (!) 156/109 (BP Location: Right Arm)   Pulse 90   Temp 98.1 F (36.7 C) (Oral)   Resp (!) 32   Ht 5\' 2"  (1.575 m)   Wt 103 kg   SpO2 98%   BMI 41.52 kg/m   Physical Exam Vitals and nursing note reviewed.  Constitutional:      Appearance: Normal appearance.  HENT:     Head: Normocephalic and atraumatic.     Right Ear: External ear normal.     Left Ear: External ear normal.     Nose: Nose normal.     Mouth/Throat:     Mouth: Mucous membranes are moist.     Pharynx: Oropharynx is clear.  Eyes:     Extraocular Movements: Extraocular movements intact.     Conjunctiva/sclera: Conjunctivae normal.  Pupils: Pupils are equal, round, and reactive to light.  Cardiovascular:     Rate and Rhythm: Normal rate and regular rhythm.     Pulses: Normal pulses.     Heart sounds: Normal heart sounds.  Pulmonary:     Effort: Pulmonary effort is normal.     Breath sounds: Normal breath sounds.  Chest:    Abdominal:     General: Abdomen is flat. Bowel sounds are normal.     Palpations: Abdomen is soft.  Musculoskeletal:        General: Normal range of motion.     Cervical back: Normal range of motion and neck supple.  Skin:    General: Skin is warm.     Capillary Refill: Capillary refill takes less than 2 seconds.  Neurological:     General: No focal deficit present.     Mental Status: She is alert and oriented to person, place, and time.  Psychiatric:        Mood and Affect: Mood normal.        Behavior: Behavior normal.     ED Results / Procedures / Treatments   Labs (all labs ordered are listed, but only abnormal results are displayed) Labs Reviewed  BASIC METABOLIC PANEL - Abnormal; Notable for the following components:        Result Value   Glucose, Bld 198 (*)    All other components within normal limits  CBC  TROPONIN I (HIGH SENSITIVITY)    EKG EKG Interpretation  Date/Time:  Friday July 31 2019 08:25:53 EST Ventricular Rate:  90 PR Interval:    QRS Duration: 85 QT Interval:  375 QTC Calculation: 459 R Axis:   57 Text Interpretation: Sinus rhythm LAE, consider biatrial enlargement No significant change since last tracing Confirmed by Isla Pence [phone]) on 07/31/2019 8:31:34 AM   Radiology DG Chest 2 View  Result Date: 07/31/2019 CLINICAL DATA:  Chest pain. EXAM: CHEST - 2 VIEW COMPARISON:  September 07, 2017. FINDINGS: Stable cardiomegaly. No pneumothorax or pleural effusion is noted. Both lungs are clear. The visualized skeletal structures are unremarkable. IMPRESSION: No active cardiopulmonary disease. Electronically Signed   By: Marijo Conception M.D.   On: 07/31/2019 09:10    Procedures Procedures (including critical care time)  Medications Ordered in ED Medications  ketorolac (TORADOL) 30 MG/ML injection 30 mg (30 mg Intravenous Given 07/31/19 0910)    ED Course  I have reviewed the triage vital signs and the nursing notes.  Pertinent labs & imaging results that were available during my care of the patient were reviewed by me and considered in my medical decision making (see chart for details).    MDM Rules/Calculators/A&P                      Heart score is 2.  She has tenderness to palpation of the chest wall, so I doubt CAD.  She has no sob and HR is nl, so I do not think she has a PE.    BS is elevated.  I looked back and her pcp noted her Hgb A1c was elevated in October.  She was supposed to go back in Jan for a recheck, but has not made an appointment.  We talked about diabetic/hypertensive diet and exercise.  Pt is also hypertensive here.  She has not taken her meds and is told to take her meds when she gets home.  Return if worse.  F/u with pcp.  Final  Clinical  Impression(s) / ED Diagnoses Final diagnoses:  Chest wall pain  Elevated blood sugar  Essential hypertension    Rx / DC Orders ED Discharge Orders         Ordered    ibuprofen (ADVIL) 600 MG tablet  Every 6 hours PRN     07/31/19 1002           Jacalyn Lefevre, MD 07/31/19 1005

## 2019-07-31 NOTE — ED Triage Notes (Signed)
Pt having bilateral shoulder pain since Monday.  Pain has expanded to neck and upper chest.  Pt denies sob, no n/v, no diaphoresis.  Pt states the pain is tightness.

## 2019-07-31 NOTE — Discharge Instructions (Addendum)
Take your BP medication when you get home.  You will need to follow up with your doctor regarding your elevated blood sugar.

## 2019-08-20 ENCOUNTER — Ambulatory Visit: Payer: Self-pay | Attending: Family Medicine | Admitting: Family Medicine

## 2019-08-20 ENCOUNTER — Other Ambulatory Visit: Payer: Self-pay

## 2019-08-20 ENCOUNTER — Encounter: Payer: Self-pay | Admitting: Family Medicine

## 2019-08-20 VITALS — BP 173/111 | HR 99 | Temp 98.2°F | Ht 62.0 in | Wt 231.6 lb

## 2019-08-20 DIAGNOSIS — Z09 Encounter for follow-up examination after completed treatment for conditions other than malignant neoplasm: Secondary | ICD-10-CM

## 2019-08-20 DIAGNOSIS — E119 Type 2 diabetes mellitus without complications: Secondary | ICD-10-CM

## 2019-08-20 DIAGNOSIS — I10 Essential (primary) hypertension: Secondary | ICD-10-CM

## 2019-08-20 LAB — POCT GLYCOSYLATED HEMOGLOBIN (HGB A1C): Hemoglobin A1C: 7.6 % — AB (ref 4.0–5.6)

## 2019-08-20 LAB — GLUCOSE, POCT (MANUAL RESULT ENTRY): POC Glucose: 118 mg/dL — AB (ref 70–99)

## 2019-08-20 MED ORDER — TRUEPLUS LANCETS 28G MISC: 11 refills | Status: DC

## 2019-08-20 MED ORDER — AMLODIPINE BESYLATE 5 MG PO TABS: 5.0000 mg | ORAL_TABLET | Freq: Every day | ORAL | 3 refills | Status: DC

## 2019-08-20 MED ORDER — METFORMIN HCL 500 MG PO TABS: 500.0000 mg | ORAL_TABLET | Freq: Two times a day (BID) | ORAL | 3 refills | Status: DC

## 2019-08-20 MED ORDER — TRUE METRIX METER W/DEVICE KIT: PACK | 0 refills | Status: DC

## 2019-08-20 MED ORDER — TRUE METRIX BLOOD GLUCOSE TEST VI STRP: ORAL_STRIP | 12 refills | Status: DC

## 2019-08-20 MED FILL — TRUEplus LANCETS 28G MISC: 50 days supply | Qty: 100 | Fill #0

## 2019-08-20 MED FILL — !TRUE METRIX BLOOD GLUCOSE: 1 days supply | Qty: 1 | Fill #0

## 2019-08-20 MED FILL — metFORMIN HCL 500 MG TABS: 500 | 30 days supply | Qty: 60 | Fill #0

## 2019-08-20 MED FILL — AMLODIPINE BESYLATE 5 MG TA: 5 | 30 days supply | Qty: 30 | Fill #0

## 2019-08-20 MED FILL — TRUE METRIX TEST STRIP: 50 days supply | Qty: 100 | Fill #0

## 2019-08-20 NOTE — Progress Notes (Signed)
Subjective:  Patient ID: Amy [name], female    DOB: [birth year]  Age: 44 y.o. MRN: [id number]  CC: No chief complaint on file.   HPI Amy [name], 44 year old female who presents for follow-up of chronic medical issues and she is status post emergency department visit on 07/31/2019 for shoulder pain radiating to the chest.  Blood pressure at that visit was elevated at 156/109.  Glucose at the ED was 198.  Patient had palpable tenderness of the chest wall and was thought to have musculoskeletal chest pain.  Per ED note, she had not taken her blood pressure medication and was instructed to restart use of medication as well as follow-up with PCP regarding elevated blood sugars.  Hemoglobin A1c at her last visit here on 03/12/2019 was also elevated at 6.7.  Patient did not keep subsequent follow-up as per ED recommendation.      At today's visit, patient reports that she wants to take better care of her health and she would like follow-up of her hypertension and diabetes.  She reports that she does not currently have diabetic monitoring supplies and would like to be able to check her blood sugars at home.  She has been trying to make changes in her diet.  She feels that she likely gained weight due to the COVID-19 pandemic which caused increased stress.  She denies any current issues with increased thirst, no blurred vision and no urinary frequency.  She reports that she has been taking her blood pressure medicine since her emergency department visit..  She has had some occasional headaches.  She has had no additional chest pain since she was seen in the ED. She does have issues with shoulder pain.    Past Medical History:  Diagnosis Date  . Anemia   . Hypertension     Past Surgical History:  Procedure Laterality Date  . CHOLECYSTECTOMY  2006  . DILITATION & CURRETTAGE/HYSTROSCOPY WITH HYDROTHERMAL ABLATION N/A 07/10/2013   Procedure: DILATATION & CURETTAGE/HYSTEROSCOPY WITH  HYDROTHERMAL ABLATION;  Surgeon: Brock Bad, MD;  Location: [facility];  Service: Gynecology;  Laterality: N/A;  . TUBAL LIGATION  1997  . WISDOM TOOTH EXTRACTION  1998    Family History  Problem Relation Age of Onset  . Breast cancer Mother   . Hypertension Mother   . COPD Other   . Hypertension Other   . Diabetes Other   . Cancer Other     Social History   Tobacco Use  . Smoking status: Never Smoker  . Smokeless tobacco: Never Used  Substance Use Topics  . Alcohol use: No    ROS Review of Systems  Constitutional: Negative for chills and fever.  HENT: Negative for sore throat and trouble swallowing.   Eyes: Negative for photophobia and visual disturbance.  Respiratory: Negative for cough and shortness of breath.   Cardiovascular: Negative for chest pain and palpitations.  Gastrointestinal: Negative for abdominal pain, blood in stool, constipation, diarrhea and nausea.  Endocrine: Negative for polyphagia and polyuria.  Genitourinary: Negative for dysuria and frequency.  Musculoskeletal: Positive for arthralgias. Negative for back pain.  Skin: Negative for rash and wound.  Neurological: Positive for headaches (occasional). Negative for dizziness.  Hematological: Negative for adenopathy. Does not bruise/bleed easily.    Objective:   Today's Vitals: BP (!) 173/111   Pulse 99   Temp 98.2 F (36.8 C) (Oral)   Ht 5\' 2"  (1.575 m)   Wt 231 lb 9.6 oz (105.1 kg)  SpO2 100%   BMI 42.36 kg/m   Physical Exam Vitals and nursing note reviewed.  Constitutional:      General: She is not in acute distress.    Appearance: Normal appearance. She is obese.     Comments: Well-nourished well-developed overweight for height female no acute distress wearing mask as per office COVID-19 protocol  Neck:     Vascular: No carotid bruit.  Cardiovascular:     Rate and Rhythm: Normal rate and regular rhythm.     Pulses: Normal pulses.          Dorsalis pedis pulses are 2+ on the  right side and 2+ on the left side.       Posterior tibial pulses are 2+ on the right side and 2+ on the left side.  Pulmonary:     Effort: Pulmonary effort is normal.     Breath sounds: Normal breath sounds.  Abdominal:     Palpations: Abdomen is soft.     Tenderness: There is no abdominal tenderness. There is no right CVA tenderness, left CVA tenderness, guarding or rebound.  Musculoskeletal:        General: No tenderness.     Cervical back: Normal range of motion and neck supple.     Right lower leg: No edema.     Left lower leg: No edema.  Feet:     Right foot:     Protective Sensation: 10 sites tested. 10 sites sensed.     Skin integrity: Skin integrity normal. No skin breakdown.     Toenail Condition: Right toenails are normal.     Left foot:     Protective Sensation: 10 sites tested. 10 sites sensed.     Skin integrity: Skin integrity normal. No skin breakdown.     Toenail Condition: Left toenails are normal.  Lymphadenopathy:     Cervical: No cervical adenopathy.  Skin:    General: Skin is warm and dry.  Neurological:     General: No focal deficit present.     Mental Status: She is alert and oriented to person, place, and time.  Psychiatric:        Mood and Affect: Mood normal.        Behavior: Behavior normal.     Assessment & Plan:  1. Essential hypertension; 3.  Encounter for examination following treatment at hospital She reports that she is taking her losartan hydrochlorothiazide 50-12.5 mg daily and she has been asked to also start the use of amlodipine 5 mg once daily as her blood pressure remains elevated at today's visit.  She is also encouraged to follow a low-sodium diet, remain compliant with medication on a daily basis as well as continue efforts at weight loss through dietary changes and low impact exercise such as walking.  She is to follow-up in 2 weeks for blood pressure recheck with the clinical pharmacist as well as follow-up of diabetes.  She may need  to increase in either losartan-HCTZ 100-25 or increase in amlodipine if not at goal.  Would like to see patient's blood pressure around 130/80 or less.  2. Type 2 diabetes mellitus without complication, without long-term current use of insulin ([place]) Patient will have hemoglobin A1c at today's visit and comprehensive metabolic panel.  Prescriptions provided for diabetic testing supplies.  Continue  low carbohydrate diet as well as efforts at weight loss and regular exercise.  Continue use of Metformin.  Patient's blood sugar was 118 at today's visit. - HgB A1c -  Glucose (CBG) - Comprehensive metabolic panel  Outpatient Encounter Medications as of 08/20/2019  Medication Sig  . ibuprofen (ADVIL) 600 MG tablet Take 1 tablet (600 mg total) by mouth every 6 (six) hours as needed.  Marland Kitchen losartan-hydrochlorothiazide (HYZAAR) 50-12.5 MG tablet Take 1 tablet by mouth daily. To lower blood pressure   No facility-administered encounter medications on file as of 08/20/2019.    An After Visit Summary was printed and given to the patient.   Follow-up: Return in about 6 weeks (around 10/01/2019) for DM/HTN- 2 weeks with Luke/CPP.   Cain Saupe MD

## 2019-08-20 NOTE — Progress Notes (Signed)
Pt is here for HFU and HTN and DM

## 2019-08-20 NOTE — Patient Instructions (Signed)
Diabetes Basics  Diabetes (diabetes mellitus) is a long-term (chronic) disease. It occurs when the body does not properly use sugar (glucose) that is released from food after you eat. Diabetes may be caused by one or both of these problems:  Your pancreas does not make enough of a hormone called insulin.  Your body does not react in a normal way to insulin that it makes. Insulin lets sugars (glucose) go into cells in your body. This gives you energy. If you have diabetes, sugars cannot get into cells. This causes high blood sugar (hyperglycemia). Follow these instructions at home: How is diabetes treated? You may need to take insulin or other diabetes medicines daily to keep your blood sugar in balance. Take your diabetes medicines every day as told by your doctor. List your diabetes medicines here: Diabetes medicines  Name of medicine: ______________________________ ? Amount (dose): _______________ Time (a.m./p.m.): _______________ Notes: ___________________________________  Name of medicine: ______________________________ ? Amount (dose): _______________ Time (a.m./p.m.): _______________ Notes: ___________________________________  Name of medicine: ______________________________ ? Amount (dose): _______________ Time (a.m./p.m.): _______________ Notes: ___________________________________ If you use insulin, you will learn how to give yourself insulin by injection. You may need to adjust the amount based on the food that you eat. List the types of insulin you use here: Insulin  Insulin type: ______________________________ ? Amount (dose): _______________ Time (a.m./p.m.): _______________ Notes: ___________________________________  Insulin type: ______________________________ ? Amount (dose): _______________ Time (a.m./p.m.): _______________ Notes: ___________________________________  Insulin type: ______________________________ ? Amount (dose): _______________ Time (a.m./p.m.):  _______________ Notes: ___________________________________  Insulin type: ______________________________ ? Amount (dose): _______________ Time (a.m./p.m.): _______________ Notes: ___________________________________  Insulin type: ______________________________ ? Amount (dose): _______________ Time (a.m./p.m.): _______________ Notes: ___________________________________ How do I manage my blood sugar?  Check your blood sugar levels using a blood glucose monitor as directed by your doctor. Your doctor will set treatment goals for you. Generally, you should have these blood sugar levels:  Before meals (preprandial): 80-130 mg/dL (4.4-7.2 mmol/L).  After meals (postprandial): below 180 mg/dL (10 mmol/L).  A1c level: less than 7%. Write down the times that you will check your blood sugar levels: Blood sugar checks  Time: _______________ Notes: ___________________________________  Time: _______________ Notes: ___________________________________  Time: _______________ Notes: ___________________________________  Time: _______________ Notes: ___________________________________  Time: _______________ Notes: ___________________________________  Time: _______________ Notes: ___________________________________  What do I need to know about low blood sugar? Low blood sugar is called hypoglycemia. This is when blood sugar is at or below 70 mg/dL (3.9 mmol/L). Symptoms may include:  Feeling: ? Hungry. ? Worried or nervous (anxious). ? Sweaty and clammy. ? Confused. ? Dizzy. ? Sleepy. ? Sick to your stomach (nauseous).  Having: ? A fast heartbeat. ? A headache. ? A change in your vision. ? Tingling or no feeling (numbness) around the mouth, lips, or tongue. ? Jerky movements that you cannot control (seizure).  Having trouble with: ? Moving (coordination). ? Sleeping. ? Passing out (fainting). ? Getting upset easily (irritability). Treating low blood sugar To treat low blood  sugar, eat or drink something sugary right away. If you can think clearly and swallow safely, follow the 15:15 rule:  Take 15 grams of a fast-acting carb (carbohydrate). Talk with your doctor about how much you should take.  Some fast-acting carbs are: ? Sugar tablets (glucose pills). Take 3-4 glucose pills. ? 6-8 pieces of hard candy. ? 4-6 oz (120-150 mL) of fruit juice. ? 4-6 oz (120-150 mL) of regular (not diet) soda. ? 1 Tbsp (15 mL) honey or sugar.    Check your blood sugar 15 minutes after you take the carb.  If your blood sugar is still at or below 70 mg/dL (3.9 mmol/L), take 15 grams of a carb again.  If your blood sugar does not go above 70 mg/dL (3.9 mmol/L) after 3 tries, get help right away.  After your blood sugar goes back to normal, eat a meal or a snack within 1 hour. Treating very low blood sugar If your blood sugar is at or below 54 mg/dL (3 mmol/L), you have very low blood sugar (severe hypoglycemia). This is an emergency. Do not wait to see if the symptoms will go away. Get medical help right away. Call your local emergency services (911 in the [place] Do not drive yourself to the hospital. Questions to ask your health care provider  Do I need to meet with a diabetes educator?  What equipment will I need to care for myself at home?  What diabetes medicines do I need? When should I take them?  How often do I need to check my blood sugar?  What number can I call if I have questions?  When is my next doctor's visit?  Where can I find a support group for people with diabetes? Where to find more information  American Diabetes Association: www.diabetes.org  American Association of Diabetes Educators: www.diabeteseducator.org/patient-resources Contact a doctor if:  Your blood sugar is at or above 240 mg/dL (13.3 mmol/L) for 2 days in a row.  You have been sick or have had a fever for 2 days or more, and you are not getting better.  You have any of these  problems for more than 6 hours: ? You cannot eat or drink. ? You feel sick to your stomach (nauseous). ? You throw up (vomit). ? You have watery poop (diarrhea). Get help right away if:  Your blood sugar is lower than 54 mg/dL (3 mmol/L).  You get confused.  You have trouble: ? Thinking clearly. ? Breathing. Summary  Diabetes (diabetes mellitus) is a long-term (chronic) disease. It occurs when the body does not properly use sugar (glucose) that is released from food after digestion.  Take insulin and diabetes medicines as told.  Check your blood sugar every day, as often as told.  Keep all follow-up visits as told by your doctor. This is important. This information is not intended to replace advice given to you by your health care provider. Make sure you discuss any questions you have with your health care provider. Document Revised: 02/04/2019 Document Reviewed: 08/16/2017 Elsevier Patient Education  2020 Elsevier Inc.   Hypertension, Adult Hypertension is another name for high blood pressure. High blood pressure forces your heart to work harder to pump blood. This can cause problems over time. There are two numbers in a blood pressure reading. There is a top number (systolic) over a bottom number (diastolic). It is best to have a blood pressure that is below 120/80. Healthy choices can help lower your blood pressure, or you may need medicine to help lower it. What are the causes? The cause of this condition is not known. Some conditions may be related to high blood pressure. What increases the risk?  Smoking.  Having type 2 diabetes mellitus, high cholesterol, or both.  Not getting enough exercise or physical activity.  Being overweight.  Having too much fat, sugar, calories, or salt (sodium) in your diet.  Drinking too much alcohol.  Having long-term (chronic) kidney disease.  Having a family history of high blood   pressure.  Age. Risk increases with  age.  Race. You may be at higher risk if you are African American.  Gender. Men are at higher risk than women before age 45. After age 65, women are at higher risk than men.  Having obstructive sleep apnea.  Stress. What are the signs or symptoms?  High blood pressure may not cause symptoms. Very high blood pressure (hypertensive crisis) may cause: ? Headache. ? Feelings of worry or nervousness (anxiety). ? Shortness of breath. ? Nosebleed. ? A feeling of being sick to your stomach (nausea). ? Throwing up (vomiting). ? Changes in how you see. ? Very bad chest pain. ? Seizures. How is this treated?  This condition is treated by making healthy lifestyle changes, such as: ? Eating healthy foods. ? Exercising more. ? Drinking less alcohol.  Your health care provider may prescribe medicine if lifestyle changes are not enough to get your blood pressure under control, and if: ? Your top number is above 130. ? Your bottom number is above 80.  Your personal target blood pressure may vary. Follow these instructions at home: Eating and drinking   If told, follow the DASH eating plan. To follow this plan: ? Fill one half of your plate at each meal with fruits and vegetables. ? Fill one fourth of your plate at each meal with whole grains. Whole grains include whole-wheat pasta, brown rice, and whole-grain bread. ? Eat or drink low-fat dairy products, such as skim milk or low-fat yogurt. ? Fill one fourth of your plate at each meal with low-fat (lean) proteins. Low-fat proteins include fish, chicken without skin, eggs, beans, and tofu. ? Avoid fatty meat, cured and processed meat, or chicken with skin. ? Avoid pre-made or processed food.  Eat less than 1,500 mg of salt each day.  Do not drink alcohol if: ? Your doctor tells you not to drink. ? You are pregnant, may be pregnant, or are planning to become pregnant.  If you drink alcohol: ? Limit how much you use to:  0-1 drink a  day for women.  0-2 drinks a day for men. ? Be aware of how much alcohol is in your drink. In the [place] one drink equals one 12 oz bottle of beer (355 mL), one 5 oz glass of wine (148 mL), or one 1 oz glass of hard liquor (44 mL). Lifestyle   Work with your doctor to stay at a healthy weight or to lose weight. Ask your doctor what the best weight is for you.  Get at least 30 minutes of exercise most days of the week. This may include walking, swimming, or biking.  Get at least 30 minutes of exercise that strengthens your muscles (resistance exercise) at least 3 days a week. This may include lifting weights or doing Pilates.  Do not use any products that contain nicotine or tobacco, such as cigarettes, e-cigarettes, and chewing tobacco. If you need help quitting, ask your doctor.  Check your blood pressure at home as told by your doctor.  Keep all follow-up visits as told by your doctor. This is important. Medicines  Take over-the-counter and prescription medicines only as told by your doctor. Follow directions carefully.  Do not skip doses of blood pressure medicine. The medicine does not work as well if you skip doses. Skipping doses also puts you at risk for problems.  Ask your doctor about side effects or reactions to medicines that you should watch for. Contact a doctor if you:    Think you are having a reaction to the medicine you are taking.  Have headaches that keep coming back (recurring).  Feel dizzy.  Have swelling in your ankles.  Have trouble with your vision. Get help right away if you:  Get a very bad headache.  Start to feel mixed up (confused).  Feel weak or numb.  Feel faint.  Have very bad pain in your: ? Chest. ? Belly (abdomen).  Throw up more than once.  Have trouble breathing. Summary  Hypertension is another name for high blood pressure.  High blood pressure forces your heart to work harder to pump blood.  For most people, a normal  blood pressure is less than 120/80.  Making healthy choices can help lower blood pressure. If your blood pressure does not get lower with healthy choices, you may need to take medicine. This information is not intended to replace advice given to you by your health care provider. Make sure you discuss any questions you have with your health care provider. Document Revised: 01/22/2018 Document Reviewed: 01/22/2018 Elsevier Patient Education  2020 Elsevier Inc.  

## 2019-08-21 LAB — COMPREHENSIVE METABOLIC PANEL WITH GFR
ALT: 10 IU/L (ref 0–32)
AST: 17 IU/L (ref 0–40)
Albumin/Globulin Ratio: 1 — ABNORMAL LOW (ref 1.2–2.2)
Albumin: 4.2 g/dL (ref 3.8–4.8)
Alkaline Phosphatase: 105 IU/L (ref 39–117)
BUN/Creatinine Ratio: 16 (ref 9–23)
BUN: 14 mg/dL (ref 6–24)
Bilirubin Total: <0.2 mg/dL (ref 0.0–1.2)
CO2: 26 mmol/L (ref 20–29)
Calcium: 9.4 mg/dL (ref 8.7–10.2)
Chloride: 102 mmol/L (ref 96–106)
Creatinine, Ser: 0.89 mg/dL (ref 0.57–1.00)
GFR calc Af Amer: 90 mL/min/1.73
GFR calc non Af Amer: 78 mL/min/1.73
Globulin, Total: 4.1 g/dL (ref 1.5–4.5)
Glucose: 120 mg/dL — ABNORMAL HIGH (ref 65–99)
Potassium: 4.2 mmol/L (ref 3.5–5.2)
Sodium: 139 mmol/L (ref 134–144)
Total Protein: 8.3 g/dL (ref 6.0–8.5)

## 2019-09-02 ENCOUNTER — Telehealth: Payer: Self-pay | Admitting: Family Medicine

## 2019-09-02 NOTE — Telephone Encounter (Signed)
Patient called to receive results. Patient was identified by 2 identifiers. Patient was given lab results and verbalized understanding and had no further questions. Patient wanted to inform pcp that the metformin is giving her diarrhea, and she would like something in the place of it. Patient also stated that the amlodipine she is allergic to, patient state that her heart races, has chest pains and itches when she takes that medication please follow up at your earliest convenience.

## 2019-09-03 ENCOUNTER — Ambulatory Visit: Payer: Self-pay | Admitting: Pharmacist

## 2019-09-04 ENCOUNTER — Encounter: Payer: Self-pay | Admitting: Pharmacist

## 2019-09-04 ENCOUNTER — Ambulatory Visit: Payer: Self-pay | Attending: Family Medicine | Admitting: Pharmacist

## 2019-09-04 ENCOUNTER — Other Ambulatory Visit: Payer: Self-pay

## 2019-09-04 VITALS — BP 180/114 | HR 90

## 2019-09-04 DIAGNOSIS — I10 Essential (primary) hypertension: Secondary | ICD-10-CM

## 2019-09-04 DIAGNOSIS — E1165 Type 2 diabetes mellitus with hyperglycemia: Secondary | ICD-10-CM

## 2019-09-04 DIAGNOSIS — E119 Type 2 diabetes mellitus without complications: Secondary | ICD-10-CM

## 2019-09-04 DIAGNOSIS — Z0131 Encounter for examination of blood pressure with abnormal findings: Secondary | ICD-10-CM

## 2019-09-04 MED ORDER — VALSARTAN-HYDROCHLOROTHIAZIDE 80-12.5 MG PO TABS: 1.0000 | ORAL_TABLET | Freq: Every day | ORAL | 0 refills | Status: DC

## 2019-09-04 MED ORDER — METFORMIN HCL ER 500 MG PO TB24: 500.0000 mg | ORAL_TABLET | Freq: Every day | ORAL | 0 refills | Status: DC

## 2019-09-04 MED FILL — VALSARTAN-HCTZ 80-12.5 MG T: 80-12.5 | 30 days supply | Qty: 30 | Fill #0

## 2019-09-04 MED FILL — metFORMIN HCL ER 500 MG TB2: 500 | 30 days supply | Qty: 30 | Fill #0

## 2019-09-04 NOTE — Telephone Encounter (Signed)
Patient stated that the amlodipine she is allergic to, patient state that her heart races, has chest pains and itches when she takes that medication please follow up at your earliest convenience.

## 2019-09-04 NOTE — Patient Instructions (Signed)
Thank you for coming to see Amy [name] today.   Blood pressure today is very high.   Please start valsartan-HCTZ. Take 1 tablet once a day.   Start taking metformin extended release. Take 1 tablet after dinner.   Limiting salt and caffeine, as well as exercising as able for at least 30 minutes for 5 days out of the week, can also help you lower your blood pressure.  Take your blood pressure at home if you are able. Please write down these numbers and bring them to your visits.  If you have any questions about medications, please call me [phone].  Amy [name]

## 2019-09-04 NOTE — Progress Notes (Signed)
    S:     No chief complaint on file.  Patient arrives in good spirits.  Presents for diabetes management and BP check.  Patient was referred and last seen by Primary Care Provider on 08/20/2019.   Family/Social History:  - FHx: DM, HTN - Tobacco: never smoker - Alcohol: denies use   Insurance coverage/medication affordability: self pay  DM:   Patient reports she recently stopped metformin d/t diarrhea. Her most recent A1c is above goal at 7.6. Home CBGs: 124-155. She endorses changing her diet to plant-based. She reports eliminating sodas from her diet. She does not get much exercise outside of work. She denies polyuria, polydipsia. Denies any recent changes in vision or neuropathy. Patient denies hypoglycemic events.She does not take any statin.   HTN: BP at last PCP visit was 173/111. Amlodipine was added to her regimen. Of note, patient called the clinic on 09/02/19. Reported allergic reaction to amlodipine. She was instructed to stop amlodipine. Unfortunately, she also stopped her losartan-HCTZ. Today, she reports she stopped her losartan-HCTZ due to dizziness. Denies chest pain, dyspnea, HA, blurred vision.   O:  POCT glucose: 196  Lab Results  Component Value Date   HGBA1C 7.6 (A) 08/20/2019   Vitals:   09/04/19 1400  BP: (!) 180/114  Pulse: 90    Lipid Panel  No results found for: CHOL, TRIG, HDL, CHOLHDL, VLDL, LDLCALC, LDLDIRECT  Clinical Atherosclerotic Cardiovascular Disease (ASCVD): No  The ASCVD Risk score Denman George DC Jr., et al., 2013) failed to calculate for the following reasons:   Cannot find a previous HDL lab   Cannot find a previous total cholesterol lab    A/P: Diabetes longstanding currently uncontrolled. Patient is able to verbalize appropriate hypoglycemia management plan. Patient is not adherent with medication. Will start metformin XR; patient has been counseled to take metformin after her largest meal to aid in prevention of diarrhea.  -Started  metformin 500 mg XR after dinner.  -Extensively discussed pathophysiology of diabetes, recommended lifestyle interventions, dietary effects on blood sugar control -Counseled on s/sx of and management of hypoglycemia -Next A1C anticipated 10/2019.   Hypertension longstanding currently uncontrolled.  Blood pressure goal = <130/80 mmHg. Patient is not wanting to take losartan-HCTZ d/t dizziness. Will start valsartan-HCTZ instead. -Start valsartan-HCTZ 80-12.5 mg daily   ASCVD risk - primary prevention in patient with diabetes. Patient needs lipid panel. At least moderate intensity statin indicated. Will address at follow-up.   Written patient instructions provided.  Total time in face to face counseling 15 minutes.   Follow up Pharmacist Clinic Visit in 1 week.   Butch Penny, PharmD, CPP Clinical Pharmacist [facility] [phone]

## 2019-09-04 NOTE — Telephone Encounter (Signed)
noted 

## 2019-09-04 NOTE — Telephone Encounter (Signed)
LMOM

## 2019-09-04 NOTE — Telephone Encounter (Signed)
Patient returned called. Patient was identified by 2 identifiers. Patient was given note from covering provider. Patient verbalized understanding and had no further questions or concerns at this time. Patient stated she will attend her appointment today with the clinical pharmacist at 2pm and will go from there.

## 2019-09-04 NOTE — Telephone Encounter (Signed)
I would recommend she d/c the Amlodipine due to potential side effects she is experiencing. I reviewed Dr. Debroah Baller note from 3/25 where patient's BP was elevated. Given stopping medication when she recently had high BP, she will need close f/u. It looks like Dr. Jillyn Hidden wanted patient to see Franky Macho within a couple weeks. If that has not been scheduled, please make sure we establish follow up with the change in medication.   Thanks!  Marcy Siren, D.O. Primary Care at [facility]  09/04/2019, 9:14 AM

## 2019-09-11 ENCOUNTER — Ambulatory Visit: Payer: Self-pay | Attending: Family Medicine | Admitting: Pharmacist

## 2019-09-11 ENCOUNTER — Other Ambulatory Visit: Payer: Self-pay

## 2019-09-11 ENCOUNTER — Encounter: Payer: Self-pay | Admitting: Pharmacist

## 2019-09-11 VITALS — BP 114/82 | HR 90

## 2019-09-11 DIAGNOSIS — I10 Essential (primary) hypertension: Secondary | ICD-10-CM

## 2019-09-11 DIAGNOSIS — E119 Type 2 diabetes mellitus without complications: Secondary | ICD-10-CM

## 2019-09-11 MED ORDER — METFORMIN HCL ER 500 MG PO TB24: 1000.0000 mg | ORAL_TABLET | Freq: Every day | ORAL | 2 refills | Status: DC

## 2019-09-11 MED ORDER — VALSARTAN-HYDROCHLOROTHIAZIDE 80-12.5 MG PO TABS: 1.0000 | ORAL_TABLET | Freq: Every day | ORAL | 2 refills | Status: DC

## 2019-09-11 MED FILL — metFORMIN HCL ER 500 MG TB2: 500 | 30 days supply | Qty: 60 | Fill #0

## 2019-09-11 NOTE — Progress Notes (Signed)
    S:     No chief complaint on file.  Patient arrives in good spirits.  Presents for diabetes management and BP check.  Patient was referred and last seen by Primary Care Provider on 08/20/2019.   Family/Social History:  - FHx: DM, HTN - Tobacco: never smoker - Alcohol: denies use   Insurance coverage/medication affordability: self pay  DM:   Patient reports compliance with XR metformin. Denies diarrhea or other side effects. Her most recent A1c is above goal at 7.6. Home CBGs: 120-138. Reports no changes in diet from last visit; endorses changing her diet to plant-based. She reports eliminating sodas from her diet. She does not get much exercise outside of work. She denies polyuria, polydipsia. Denies any recent changes in vision or neuropathy. Patient denies hypoglycemic events.She does not take any statin.   HTN: BP improved today. We changed her to valsartan-HCTZ at last visit. Today, she denies dizziness. Denies chest pain, dyspnea, HA, blurred vision.   O:  POCT glucose: 164  Lab Results  Component Value Date   HGBA1C 7.6 (A) 08/20/2019   Vitals:   09/11/19 1454  BP: 114/82  Pulse: 90   Lipid Panel  No results found for: CHOL, TRIG, HDL, CHOLHDL, VLDL, LDLCALC, LDLDIRECT  Clinical Atherosclerotic Cardiovascular Disease (ASCVD): No  The ASCVD Risk score Denman George DC Jr., et al., 2013) failed to calculate for the following reasons:   Cannot find a previous HDL lab   Cannot find a previous total cholesterol lab    A/P: Diabetes longstanding currently uncontrolled based on A1c but home sugars are close to goal. Patient is able to verbalize appropriate hypoglycemia management plan. Patient is adherent with medication.  -Increase dose of metformin to 1000 mg XR (2 tablets) after dinner.  -Extensively discussed pathophysiology of diabetes, recommended lifestyle interventions, dietary effects on blood sugar control -Counseled on s/sx of and management of hypoglycemia -Next  A1C anticipated 10/2019.   Hypertension longstanding currently at goal.  Blood pressure goal = <130/80 mmHg. Patient reports adherence to Diovan-HCT and is tolerating it well. -Continue valsartan-HCTZ 80-12.5 mg daily   ASCVD risk - primary prevention in patient with diabetes. Patient needs lipid panel. At least moderate intensity statin indicated. Will defer to PCP at follow-up.  Written patient instructions provided.  Total time in face to face counseling 15 minutes.   Follow up Pharmacist Clinic Visit in 1 week.   Butch Penny, PharmD, CPP Clinical Pharmacist [facility] [phone]

## 2019-09-29 ENCOUNTER — Emergency Department (HOSPITAL_COMMUNITY)
Admission: EM | Admit: 2019-09-29 | Discharge: 2019-09-29 | Disposition: A | Discharge status: Home / Self Care | Payer: Self-pay | Attending: Emergency Medicine | Admitting: Emergency Medicine

## 2019-09-29 ENCOUNTER — Other Ambulatory Visit: Payer: Self-pay

## 2019-09-29 ENCOUNTER — Encounter (HOSPITAL_COMMUNITY): Payer: Self-pay | Admitting: Emergency Medicine

## 2019-09-29 ENCOUNTER — Emergency Department (HOSPITAL_COMMUNITY): Payer: Self-pay

## 2019-09-29 DIAGNOSIS — Z79899 Other long term (current) drug therapy: Secondary | ICD-10-CM | POA: Insufficient documentation

## 2019-09-29 DIAGNOSIS — E119 Type 2 diabetes mellitus without complications: Secondary | ICD-10-CM | POA: Insufficient documentation

## 2019-09-29 DIAGNOSIS — A09 Infectious gastroenteritis and colitis, unspecified: Secondary | ICD-10-CM | POA: Insufficient documentation

## 2019-09-29 DIAGNOSIS — R109 Unspecified abdominal pain: Secondary | ICD-10-CM | POA: Insufficient documentation

## 2019-09-29 DIAGNOSIS — I10 Essential (primary) hypertension: Secondary | ICD-10-CM | POA: Insufficient documentation

## 2019-09-29 DIAGNOSIS — Z794 Long term (current) use of insulin: Secondary | ICD-10-CM | POA: Insufficient documentation

## 2019-09-29 HISTORY — DX: Type 2 diabetes mellitus without complications: E11.9

## 2019-09-29 LAB — CBC
HCT: 34.9 % — ABNORMAL LOW (ref 36.0–46.0)
Hemoglobin: 11.3 g/dL — ABNORMAL LOW (ref 12.0–15.0)
MCH: 28.3 pg (ref 26.0–34.0)
MCHC: 32.4 g/dL (ref 30.0–36.0)
MCV: 87.3 fL (ref 80.0–100.0)
Platelets: 331 10*3/uL (ref 150–400)
RBC: 4 MIL/uL (ref 3.87–5.11)
RDW: 12.7 % (ref 11.5–15.5)
WBC: 6.5 10*3/uL (ref 4.0–10.5)
nRBC: 0 % (ref 0.0–0.2)

## 2019-09-29 LAB — URINALYSIS, ROUTINE W REFLEX MICROSCOPIC
Bilirubin Urine: NEGATIVE
Glucose, UA: NEGATIVE mg/dL
Hgb urine dipstick: NEGATIVE
Ketones, ur: NEGATIVE mg/dL
Nitrite: NEGATIVE
Protein, ur: NEGATIVE mg/dL
Specific Gravity, Urine: 1.026 (ref 1.005–1.030)
pH: 5 (ref 5.0–8.0)

## 2019-09-29 LAB — COMPREHENSIVE METABOLIC PANEL
ALT: 33 U/L (ref 0–44)
AST: 29 U/L (ref 15–41)
Albumin: 3.6 g/dL (ref 3.5–5.0)
Alkaline Phosphatase: 80 U/L (ref 38–126)
Anion gap: 6 (ref 5–15)
BUN: 13 mg/dL (ref 6–20)
CO2: 28 mmol/L (ref 22–32)
Calcium: 8.8 mg/dL — ABNORMAL LOW (ref 8.9–10.3)
Chloride: 102 mmol/L (ref 98–111)
Creatinine, Ser: 0.86 mg/dL (ref 0.44–1.00)
GFR calc Af Amer: >60 mL/min (ref >60)
GFR calc non Af Amer: >60 mL/min (ref >60)
Glucose, Bld: 145 mg/dL — ABNORMAL HIGH (ref 70–99)
Potassium: 3.4 mmol/L — ABNORMAL LOW (ref 3.5–5.1)
Sodium: 136 mmol/L (ref 135–145)
Total Bilirubin: 0.3 mg/dL (ref 0.3–1.2)
Total Protein: 8.2 g/dL — ABNORMAL HIGH (ref 6.5–8.1)

## 2019-09-29 LAB — PREGNANCY, URINE: Preg Test, Ur: NEGATIVE

## 2019-09-29 LAB — LIPASE, BLOOD: Lipase: 17 U/L (ref 11–51)

## 2019-09-29 MED ORDER — SODIUM CHLORIDE 0.9 % IV BOLUS
1000.0000 mL | Freq: Once | INTRAVENOUS | Status: AC
  Administered 2019-09-29: 10:00:00 1000 mL via INTRAVENOUS

## 2019-09-29 MED ORDER — ONDANSETRON HCL 4 MG/2ML IJ SOLN
4.0000 mg | Freq: Once | INTRAMUSCULAR | Status: AC
  Administered 2019-09-29: 10:00:00 4 mg via INTRAVENOUS
  Filled 2019-09-29: qty 2

## 2019-09-29 MED ORDER — ONDANSETRON 4 MG PO TBDP
ORAL_TABLET | ORAL | 0 refills | Status: DC
Start: 2019-09-29 — End: 2021-01-16

## 2019-09-29 MED ORDER — SODIUM CHLORIDE 0.9% FLUSH: 3.0000 mL | Freq: Once | INTRAVENOUS | Status: DC

## 2019-09-29 MED FILL — ONDANSETRON ODT 4 MG TABLET: 4 | 2 days supply | Qty: 12 | Fill #0

## 2019-09-29 NOTE — Discharge Instructions (Addendum)
Drink plenty of fluids.  Take Tylenol for pain and follow-up with your family doctor next week if not improving

## 2019-09-29 NOTE — ED Triage Notes (Signed)
Pt reports the past couple days had loose stools and some abd pains. reports Metformin was causing diarrhea and had change in dosage last week.

## 2019-09-29 NOTE — ED Provider Notes (Signed)
[place] DEPT Provider Note   CSN: [id number] Arrival date & time: 09/29/19  0750     History Chief Complaint  Patient presents with  . Diarrhea  . Abdominal Pain    Amy [name] is a 44 y.o. female.  Patient complains of abdominal discomfort and diarrhea since Sunday  The history is provided by the patient and medical records. No language interpreter was used.  Diarrhea Quality:  Semi-solid Severity:  Moderate Onset quality:  Sudden Timing:  Constant Progression:  Unchanged Relieved by:  Nothing Worsened by:  Nothing Ineffective treatments:  None tried Associated symptoms: abdominal pain   Associated symptoms: no headaches   Risk factors: no recent antibiotic use        Past Medical History:  Diagnosis Date  . Anemia   . Diabetes mellitus without complication ([place])   . Hypertension     Patient Active Problem List   Diagnosis Date Noted  . Excessive or frequent menstruation 09/10/2012    Past Surgical History:  Procedure Laterality Date  . CHOLECYSTECTOMY  2006  . DILITATION & CURRETTAGE/HYSTROSCOPY WITH HYDROTHERMAL ABLATION N/A 07/10/2013   Procedure: DILATATION & CURETTAGE/HYSTEROSCOPY WITH HYDROTHERMAL ABLATION;  Surgeon: Shelly Bombard, MD;  Location: [facility];  Service: Gynecology;  Laterality: N/A;  . TUBAL LIGATION  1997  . WISDOM TOOTH EXTRACTION  1998     OB History    Gravida  2   Para  2   Term  2   Preterm      AB      Living  2     SAB      TAB      Ectopic      Multiple      Live Births  2           Family History  Problem Relation Age of Onset  . Breast cancer Mother   . Hypertension Mother   . COPD Other   . Hypertension Other   . Diabetes Other   . Cancer Other     Social History   Tobacco Use  . Smoking status: Never Smoker  . Smokeless tobacco: Never Used  Substance Use Topics  . Alcohol use: No  . Drug use: No    Home Medications Prior to Admission  medications   Medication Sig Start Date End Date Taking? Authorizing Provider  fexofenadine-pseudoephedrine (ALLEGRA-D 24) 180-240 MG 24 hr tablet Take 1 tablet by mouth daily.   Yes [provider]  ibuprofen (ADVIL) 600 MG tablet Take 1 tablet (600 mg total) by mouth every 6 (six) hours as needed. Patient taking differently: Take 600 mg by mouth every 6 (six) hours as needed for headache or moderate pain.  07/31/19  Yes Isla Pence, MD  metFORMIN (GLUCOPHAGE-XR) 500 MG 24 hr tablet Take 2 tablets (1,000 mg total) by mouth daily after supper. 09/11/19  Yes Fulp, Cammie, MD  Multiple Vitamin (MULTIVITAMIN WITH MINERALS) TABS tablet Take 1 tablet by mouth daily.   Yes [provider]  naproxen sodium (ALEVE) 220 MG tablet Take 220 mg by mouth 2 (two) times daily as needed (headache/pain).   Yes [provider]  valsartan-hydrochlorothiazide (DIOVAN-HCT) 80-12.5 MG tablet Take 1 tablet by mouth daily. 09/11/19  Yes Fulp, Cammie, MD  Blood Glucose Monitoring Suppl (TRUE METRIX METER) w/Device KIT Use to check blood sugars twice per day 08/20/19   Fulp, Cammie, MD  glucose blood (TRUE METRIX BLOOD GLUCOSE TEST) test strip Use as  instructed to check BS twice per day 08/20/19   Antony Blackbird, MD  ondansetron (ZOFRAN ODT) 4 MG disintegrating tablet 35m ODT q4 hours prn nausea/vomit 09/29/19   ZMilton Ferguson MD  TRUEplus Lancets 28G MISC Use when checking BS twice per day 08/20/19   FAntony Blackbird MD    Allergies    Amlodipine, Hydrocodone-acetaminophen, and Losartan potassium-hctz  Review of Systems   Review of Systems  Constitutional: Negative for appetite change and fatigue.  HENT: Negative for congestion, ear discharge and sinus pressure.   Eyes: Negative for discharge.  Respiratory: Negative for cough.   Cardiovascular: Negative for chest pain.  Gastrointestinal: Positive for abdominal pain and diarrhea.  Genitourinary: Negative for frequency and hematuria.    Musculoskeletal: Negative for back pain.  Skin: Negative for rash.  Neurological: Negative for seizures and headaches.  Psychiatric/Behavioral: Negative for hallucinations.    Physical Exam Updated Vital Signs BP (!) 152/102 Comment: hasnt taken HTN meds today  Pulse 83   Temp 98.6 F (37 C) (Oral)   Resp 18   SpO2 98%   Physical Exam Vitals and nursing note reviewed.  Constitutional:      Appearance: She is well-developed.  HENT:     Head: Normocephalic.     Nose: Nose normal.  Eyes:     General: No scleral icterus.    Conjunctiva/sclera: Conjunctivae normal.  Neck:     Thyroid: No thyromegaly.  Cardiovascular:     Rate and Rhythm: Normal rate and regular rhythm.     Heart sounds: No murmur. No friction rub. No gallop.   Pulmonary:     Breath sounds: No stridor. No wheezing or rales.  Chest:     Chest wall: No tenderness.  Abdominal:     General: There is no distension.     Tenderness: There is abdominal tenderness. There is no rebound.  Musculoskeletal:        General: Normal range of motion.     Cervical back: Neck supple.  Lymphadenopathy:     Cervical: No cervical adenopathy.  Skin:    Findings: No erythema or rash.  Neurological:     Mental Status: She is alert and oriented to person, place, and time.     Motor: No abnormal muscle tone.     Coordination: Coordination normal.  Psychiatric:        Behavior: Behavior normal.     ED Results / Procedures / Treatments   Labs (all labs ordered are listed, but only abnormal results are displayed) Labs Reviewed  COMPREHENSIVE METABOLIC PANEL - Abnormal; Notable for the following components:      Result Value   Potassium 3.4 (*)    Glucose, Bld 145 (*)    Calcium 8.8 (*)    Total Protein 8.2 (*)    All other components within normal limits  CBC - Abnormal; Notable for the following components:   Hemoglobin 11.3 (*)    HCT 34.9 (*)    All other components within normal limits  URINALYSIS, ROUTINE W  REFLEX MICROSCOPIC - Abnormal; Notable for the following components:   Leukocytes,Ua TRACE (*)    Bacteria, UA FEW (*)    All other components within normal limits  LIPASE, BLOOD  PREGNANCY, URINE  I-STAT BETA HCG BLOOD, ED ([facility], [facility], [facility] ONLY)    EKG None  Radiology DG ABD ACUTE 2+V W 1V CHEST  Result Date: 09/29/2019 CLINICAL DATA:  Abdominal pain.  Loose stools. EXAM: DG ABDOMEN ACUTE W/ 1V CHEST COMPARISON:  Abdominal radiographs dated 09/29/2017 and chest x-ray dated 07/31/2019 FINDINGS: There is no evidence of dilated bowel loops or free intraperitoneal air. No radiopaque calculi or other significant radiographic abnormality is seen. Surgical clips from previous cholecystectomy. Heart size and mediastinal contours are within normal limits. Both lungs are clear. IMPRESSION: Negative abdominal radiographs.  No acute cardiopulmonary disease. Electronically Signed   By: Lorriane Shire M.D.   On: 09/29/2019 11:36    Procedures Procedures (including critical care time)  Medications Ordered in ED Medications  sodium chloride flush (NS) 0.9 % injection 3 mL (3 mLs Intravenous Not Given 09/29/19 0936)  sodium chloride 0.9 % bolus 1,000 mL (1,000 mLs Intravenous New Bag/Given 09/29/19 0934)  ondansetron (ZOFRAN) injection 4 mg (4 mg Intravenous Given 09/29/19 0934)     ED Course  I have reviewed the triage vital signs and the nursing notes.  Pertinent labs & imaging results that were available during my care of the patient were reviewed by me and considered in my medical decision making (see chart for details).    MDM Rules/Calculators/A&P                        Labs and x-ray unremarkable.  Pt discharged with zofran, suspect viral infection Will follow up with pcp    This patient presents to the ED for concern of diarrhea, this involves an extensive number of treatment options, and is a complaint that carries with it a high risk of complications and morbidity.  The differential  diagnosis includes gastroenteritis appendicitis   Lab Tests:   I Ordered, reviewed, and interpreted labs, which included cbc chemistry which showed mild anemia  Medicines ordered:   I ordered medication normal saline and Zofran  Imaging Studies ordered:   I ordered imaging studies which included acute abdominal series and  I independently visualized and interpreted imaging which showed unremarkable  Additional history obtained:   Additional history obtained from records  Previous records obtained and reviewed   Consultations Obtained:  Reevaluation:  After the interventions stated above, I reevaluated the patient and found improvement  Critical Interventions:  .   Final Clini dischargecal Impression(s) / ED Diagnoses Final diagnoses:  Diarrhea of infectious origin    Rx / DC Orders ED Discharge Orders         Ordered    ondansetron (ZOFRAN ODT) 4 MG disintegrating tablet     09/29/19 1420           Milton Ferguson, MD 09/29/19 1606

## 2019-10-01 ENCOUNTER — Other Ambulatory Visit: Payer: Self-pay

## 2019-10-01 ENCOUNTER — Encounter: Payer: Self-pay | Admitting: Family Medicine

## 2019-10-01 ENCOUNTER — Ambulatory Visit: Payer: Self-pay | Attending: Family Medicine | Admitting: Family Medicine

## 2019-10-01 VITALS — BP 146/97 | HR 81 | Temp 97.7°F | Ht 62.0 in | Wt 238.0 lb

## 2019-10-01 DIAGNOSIS — I10 Essential (primary) hypertension: Secondary | ICD-10-CM

## 2019-10-01 DIAGNOSIS — E119 Type 2 diabetes mellitus without complications: Secondary | ICD-10-CM

## 2019-10-01 DIAGNOSIS — Z09 Encounter for follow-up examination after completed treatment for conditions other than malignant neoplasm: Secondary | ICD-10-CM

## 2019-10-01 MED FILL — metFORMIN HCL ER 500 MG TB2: 500 | 30 days supply | Qty: 60 | Fill #0

## 2019-10-01 NOTE — Progress Notes (Signed)
Subjective:  Patient ID: Amy [name], female    DOB: September 11, 1975  Age: 44 y.o. MRN: [id number]  CC: Hypertension/diabetes follow-up-Conrad Zajkowski MD  HPI Amy [name], 44 yo female, who presents for ongoing medical management of chronic medical issues including Type 2 DM and Hypertension. She is also status post recent ED visit on 09/29/2019 at [facility] due to abdominal pain and diarrhea which have now resolved.  Most recent hemoglobin A1c was 7.6 in March.  She has followed up with the clinical pharmacist recently regarding her hypertension and diabetes.  She did have issues tolerating Metformin but was switched to Metformin extended release which she now tolerates without any issues.  She reports that she has been taking her blood pressure medications.  She denies any current issues with headaches or dizziness related to her blood pressure.  Other than have some fatigue related to recent ED visit for gastroenteritis, she reports that she is feeling well.  Past Medical History:  Diagnosis Date  . Anemia   . Diabetes mellitus without complication ([place])   . Hypertension     Past Surgical History:  Procedure Laterality Date  . CHOLECYSTECTOMY  2006  . DILITATION & CURRETTAGE/HYSTROSCOPY WITH HYDROTHERMAL ABLATION N/A 07/10/2013   Procedure: DILATATION & CURETTAGE/HYSTEROSCOPY WITH HYDROTHERMAL ABLATION;  Surgeon: Shelly Bombard, MD;  Location: [facility];  Service: Gynecology;  Laterality: N/A;  . TUBAL LIGATION  1997  . WISDOM TOOTH EXTRACTION  1998    Family History  Problem Relation Age of Onset  . Breast cancer Mother   . Hypertension Mother   . COPD Other   . Hypertension Other   . Diabetes Other   . Cancer Other     Social History   Tobacco Use  . Smoking status: Never Smoker  . Smokeless tobacco: Never Used  Substance Use Topics  . Alcohol use: No    ROS Review of Systems  Constitutional: Positive for fatigue (Improved). Negative for chills  and fever.  HENT: Negative for sore throat and trouble swallowing.   Eyes: Negative for photophobia and visual disturbance.  Respiratory: Negative for cough and shortness of breath.   Cardiovascular: Negative for chest pain and palpitations.  Gastrointestinal: Negative for abdominal pain, constipation, diarrhea (Resolving status post ED visit), nausea (Resolved) and vomiting.  Endocrine: Negative for polydipsia, polyphagia and polyuria.  Genitourinary: Negative for dysuria and frequency.  Musculoskeletal: Negative for arthralgias and back pain.  Skin: Negative for rash and wound.  Neurological: Negative for dizziness and headaches.  Hematological: Negative for adenopathy. Does not bruise/bleed easily.  Psychiatric/Behavioral: Negative for self-injury and suicidal ideas.    Objective:   Today's Vitals: BP (!) 146/97   Pulse 81   Temp 97.7 F (36.5 C) (Temporal)   Ht 5' 2"  (1.575 m)   Wt 238 lb (108 kg)   SpO2 96%   BMI 43.53 kg/m   Physical Exam Vitals and nursing note reviewed.  Constitutional:      General: She is not in acute distress.    Appearance: She is obese. She is not ill-appearing.  Cardiovascular:     Rate and Rhythm: Normal rate and regular rhythm.  Pulmonary:     Effort: Pulmonary effort is normal.     Breath sounds: Normal breath sounds.  Abdominal:     Palpations: Abdomen is soft.     Tenderness: There is no abdominal tenderness. There is no right CVA tenderness, left CVA tenderness, guarding or rebound.  Musculoskeletal:  General: No tenderness.     Cervical back: Normal range of motion and neck supple. No tenderness.     Right lower leg: No edema.     Left lower leg: No edema.  Lymphadenopathy:     Cervical: No cervical adenopathy.  Skin:    General: Skin is warm and dry.  Neurological:     General: No focal deficit present.     Mental Status: She is alert and oriented to person, place, and time.  Psychiatric:        Mood and Affect: Mood  normal.     Assessment & Plan:  1. Type 2 diabetes mellitus without complication, without long-term current use of insulin ([place]) She reports that she continues to make dietary changes and is tolerating Metformin XR.  Glucose at today's visit was 118.  Low impact exercise such as walking encouraged. - Glucose (CBG)  2. Essential hypertension Notes reviewed from the clinical pharmacist and patient is to continue her current valsartan hydrochlorothiazide, monitor blood pressures and follow a low-sodium diet.  She is to keep her upcoming appointment with the clinical pharmacist regarding blood pressure recheck in case medication adjustments need to be made. - valsartan-hydrochlorothiazide (DIOVAN-HCT) 80-12.5 MG tablet; Take 1 tablet by mouth daily.  Dispense: 30 tablet; Refill: 2  3.  Encounter for examination following treatment at hospital Notes from patient's recent ED visit reviewed and discussed with the patient who reports that she feels better following IV fluids and Zofran at the ED.  Outpatient Encounter Medications as of 10/01/2019  Medication Sig  . Blood Glucose Monitoring Suppl (TRUE METRIX METER) w/Device KIT Use to check blood sugars twice per day  . fexofenadine-pseudoephedrine (ALLEGRA-D 24) 180-240 MG 24 hr tablet Take 1 tablet by mouth daily.  Marland Kitchen glucose blood (TRUE METRIX BLOOD GLUCOSE TEST) test strip Use as instructed to check BS twice per day  . ibuprofen (ADVIL) 600 MG tablet Take 1 tablet (600 mg total) by mouth every 6 (six) hours as needed. (Patient taking differently: Take 600 mg by mouth every 6 (six) hours as needed for headache or moderate pain. )  . metFORMIN (GLUCOPHAGE-XR) 500 MG 24 hr tablet Take 2 tablets (1,000 mg total) by mouth daily after supper.  . Multiple Vitamin (MULTIVITAMIN WITH MINERALS) TABS tablet Take 1 tablet by mouth daily.  . naproxen sodium (ALEVE) 220 MG tablet Take 220 mg by mouth 2 (two) times daily as needed (headache/pain).  . ondansetron  (ZOFRAN ODT) 4 MG disintegrating tablet 53m ODT q4 hours prn nausea/vomit  . TRUEplus Lancets 28G MISC Use when checking BS twice per day  . valsartan-hydrochlorothiazide (DIOVAN-HCT) 80-12.5 MG tablet Take 1 tablet by mouth daily.  . [DISCONTINUED] valsartan-hydrochlorothiazide (DIOVAN-HCT) 80-12.5 MG tablet Take 1 tablet by mouth daily.   No facility-administered encounter medications on file as of 10/01/2019.    An After Visit Summary was printed and given to the patient.   Follow-up: Return in about 3 months (around 01/01/2020) for DM/HTN- 2 or 3 weeks with LLurena Joinerfor BP/Tdap.    CAntony BlackbirdMD

## 2019-10-01 NOTE — Progress Notes (Signed)
cbg  121 today  DM and HTN  Needs form for school completed (physical)

## 2019-10-13 ENCOUNTER — Other Ambulatory Visit: Payer: Self-pay

## 2019-10-13 ENCOUNTER — Ambulatory Visit: Payer: Self-pay | Attending: Family Medicine | Admitting: Pharmacist

## 2019-10-13 ENCOUNTER — Encounter: Payer: Self-pay | Admitting: Pharmacist

## 2019-10-13 VITALS — BP 123/71 | HR 92

## 2019-10-13 DIAGNOSIS — I10 Essential (primary) hypertension: Secondary | ICD-10-CM

## 2019-10-13 NOTE — Progress Notes (Signed)
    S:    PCP: Dr. Jillyn Hidden  No chief complaint on file.  Patient arrives in good spirits.  Presents for BP check.  Patient was referred and last seen by Primary Care Provider on 10/01/2019. BP was 146/97 at that visit but pt reports there were problems with her BP cuff at that visit.   Today, she denies dizziness. Denies chest pain, dyspnea, HA, blurred vision.   Reports compliance with her antihypertensives.      Family/Social History:  - FHx: DM, HTN - Tobacco: never smoker - Alcohol: denies use   Insurance coverage/medication affordability: self pay  O:  Vitals:   10/13/19 1438  BP: 123/71  Pulse: 92   Lipid Panel  No results found for: CHOL, TRIG, HDL, CHOLHDL, VLDL, LDLCALC, LDLDIRECT  Clinical Atherosclerotic Cardiovascular Disease (ASCVD): No  The ASCVD Risk score Denman George DC Jr., et al., 2013) failed to calculate for the following reasons:   Cannot find a previous HDL lab   Cannot find a previous total cholesterol lab    A/P: Hypertension longstanding currently at goal.  Blood pressure goal = <130/80 mmHg. Patient reports adherence to Diovan-HCT and is tolerating it well. -Continue valsartan-HCTZ 80-12.5 mg daily   Written patient instructions provided.  Total time in face to face counseling 15 minutes.   Follow up with PCP 12/31/2019.  Butch Penny, PharmD, CPP Clinical Pharmacist [facility] [phone]

## 2019-10-14 MED ORDER — VALSARTAN-HYDROCHLOROTHIAZIDE 80-12.5 MG PO TABS: 1.0000 | ORAL_TABLET | Freq: Every day | ORAL | 2 refills | Status: DC

## 2019-10-16 ENCOUNTER — Other Ambulatory Visit: Payer: Self-pay

## 2019-10-16 ENCOUNTER — Encounter (HOSPITAL_BASED_OUTPATIENT_CLINIC_OR_DEPARTMENT_OTHER): Payer: Self-pay

## 2019-10-16 ENCOUNTER — Emergency Department (HOSPITAL_BASED_OUTPATIENT_CLINIC_OR_DEPARTMENT_OTHER)
Admission: EM | Admit: 2019-10-16 | Discharge: 2019-10-16 | Disposition: A | Discharge status: Home / Self Care | Payer: Self-pay | Attending: Emergency Medicine | Admitting: Emergency Medicine

## 2019-10-16 DIAGNOSIS — T8090XA Unspecified complication following infusion and therapeutic injection, initial encounter: Secondary | ICD-10-CM

## 2019-10-16 DIAGNOSIS — T881XXA Other complications following immunization, not elsewhere classified, initial encounter: Secondary | ICD-10-CM | POA: Insufficient documentation

## 2019-10-16 DIAGNOSIS — E119 Type 2 diabetes mellitus without complications: Secondary | ICD-10-CM | POA: Insufficient documentation

## 2019-10-16 DIAGNOSIS — R2232 Localized swelling, mass and lump, left upper limb: Secondary | ICD-10-CM | POA: Insufficient documentation

## 2019-10-16 DIAGNOSIS — I10 Essential (primary) hypertension: Secondary | ICD-10-CM | POA: Insufficient documentation

## 2019-10-16 DIAGNOSIS — Z79899 Other long term (current) drug therapy: Secondary | ICD-10-CM | POA: Insufficient documentation

## 2019-10-16 DIAGNOSIS — Z7984 Long term (current) use of oral hypoglycemic drugs: Secondary | ICD-10-CM | POA: Insufficient documentation

## 2019-10-16 MED ORDER — IBUPROFEN 600 MG PO TABS
600.0000 mg | ORAL_TABLET | Freq: Four times a day (QID) | ORAL | 0 refills | Status: DC | PRN
Start: 2019-10-16 — End: 2021-01-16

## 2019-10-16 MED FILL — IBUPROFEN 600 MG TABLET: 600 | 7 days supply | Qty: 30 | Fill #0

## 2019-10-16 NOTE — ED Triage Notes (Signed)
Pt c/o pain/swelling to left upper arm after receiving tetanus injection 2 days ago-NAD-steady gait

## 2019-10-16 NOTE — Discharge Instructions (Signed)
As discussed, your pain and swelling is likely a reaction to your tetanus shot. I am sending you home with ibuprofen. Take as prescribed. Use warm compress over area. There was no abscess in that area today. Please follow-up with PCP if symptoms do not improve within the next week. Return to the ER for new or worsening symptoms.

## 2019-10-16 NOTE — ED Provider Notes (Signed)
[facility] EMERGENCY DEPARTMENT Provider Note   CSN: [id number] Arrival date & time: 10/16/19  1354     History Chief Complaint  Patient presents with  . Arm Pain    Amy [name] is a 44 y.o. female with a past medical history significant for anemia, diabetes, and hypertension who presents to the ED due to left upper extremity pain and swelling after receiving a tetanus shot 2 days ago. Denies drainage from area. Denies fever and chills. She has taken Aleve with no relief. Pain is worse with palpation. No alleviating factors.   History obtained from patient and past medical records. No interpreter used during encounter.      Past Medical History:  Diagnosis Date  . Anemia   . Diabetes mellitus without complication ([place])   . Hypertension     Patient Active Problem List   Diagnosis Date Noted  . Excessive or frequent menstruation 09/10/2012    Past Surgical History:  Procedure Laterality Date  . CHOLECYSTECTOMY  2006  . DILITATION & CURRETTAGE/HYSTROSCOPY WITH HYDROTHERMAL ABLATION N/A 07/10/2013   Procedure: DILATATION & CURETTAGE/HYSTEROSCOPY WITH HYDROTHERMAL ABLATION;  Surgeon: Shelly Bombard, MD;  Location: [facility];  Service: Gynecology;  Laterality: N/A;  . TUBAL LIGATION  1997  . WISDOM TOOTH EXTRACTION  1998     OB History    Gravida  2   Para  2   Term  2   Preterm      AB      Living  2     SAB      TAB      Ectopic      Multiple      Live Births  2           Family History  Problem Relation Age of Onset  . Breast cancer Mother   . Hypertension Mother   . COPD Other   . Hypertension Other   . Diabetes Other   . Cancer Other     Social History   Tobacco Use  . Smoking status: Never Smoker  . Smokeless tobacco: Never Used  Substance Use Topics  . Alcohol use: No  . Drug use: No    Home Medications Prior to Admission medications   Medication Sig Start Date End Date Taking? Authorizing Provider  Blood  Glucose Monitoring Suppl (TRUE METRIX METER) w/Device KIT Use to check blood sugars twice per day 08/20/19   Fulp, Cammie, MD  fexofenadine-pseudoephedrine (ALLEGRA-D 24) 180-240 MG 24 hr tablet Take 1 tablet by mouth daily.    [provider]  glucose blood (TRUE METRIX BLOOD GLUCOSE TEST) test strip Use as instructed to check BS twice per day 08/20/19   Fulp, Cammie, MD  ibuprofen (ADVIL) 600 MG tablet Take 1 tablet (600 mg total) by mouth every 6 (six) hours as needed. Patient taking differently: Take 600 mg by mouth every 6 (six) hours as needed for headache or moderate pain.  07/31/19   Isla Pence, MD  ibuprofen (ADVIL) 600 MG tablet Take 1 tablet (600 mg total) by mouth every 6 (six) hours as needed. 10/16/19   Suzy Bouchard, PA-C  metFORMIN (GLUCOPHAGE-XR) 500 MG 24 hr tablet Take 2 tablets (1,000 mg total) by mouth daily after supper. 09/11/19   Fulp, Cammie, MD  Multiple Vitamin (MULTIVITAMIN WITH MINERALS) TABS tablet Take 1 tablet by mouth daily.    [provider]  naproxen sodium (ALEVE) 220 MG tablet Take 220 mg by mouth 2 (  two) times daily as needed (headache/pain).    [provider]  ondansetron (ZOFRAN ODT) 4 MG disintegrating tablet 50m ODT q4 hours prn nausea/vomit 09/29/19   ZMilton Ferguson MD  TRUEplus Lancets 28G MISC Use when checking BS twice per day 08/20/19   Fulp, Cammie, MD  valsartan-hydrochlorothiazide (DIOVAN-HCT) 80-12.5 MG tablet Take 1 tablet by mouth daily. 10/14/19   Fulp, Cammie, MD    Allergies    Amlodipine, Hydrocodone-acetaminophen, and Losartan potassium-hctz  Review of Systems   Review of Systems  Constitutional: Negative for chills and fever.  Musculoskeletal: Positive for myalgias.  Skin: Positive for color change and wound.  All other systems reviewed and are negative.   Physical Exam Updated Vital Signs BP 135/78 (BP Location: Right Arm)   Pulse 95   Temp 99.2 F (37.3 C) (Oral)   Resp 18   Ht 5' 2"  (1.575 m)    Wt 105.2 kg   SpO2 100%   BMI 42.43 kg/m   Physical Exam Vitals and nursing note reviewed.  Constitutional:      General: She is not in acute distress.    Appearance: She is not ill-appearing.  HENT:     Head: Normocephalic.  Eyes:     Pupils: Pupils are equal, round, and reactive to light.  Cardiovascular:     Rate and Rhythm: Normal rate and regular rhythm.     Pulses: Normal pulses.     Heart sounds: Normal heart sounds. No murmur. No friction rub. No gallop.   Pulmonary:     Effort: Pulmonary effort is normal.     Breath sounds: Normal breath sounds.  Abdominal:     General: Abdomen is flat. There is no distension.     Palpations: Abdomen is soft.     Tenderness: There is no abdominal tenderness. There is no guarding or rebound.  Musculoskeletal:     Cervical back: Neck supple.     Comments: Able to move all 4 extremities without difficulty.   Skin:    Comments: 3x4cm area of induration over left deltoid. No fluctuance. No drainage. Mild surrounding erythema.   Neurological:     General: No focal deficit present.     Mental Status: She is alert.     ED Results / Procedures / Treatments   Labs (all labs ordered are listed, but only abnormal results are displayed) Labs Reviewed - No data to display  EKG None  Radiology No results found.  Procedures Ultrasound ED Soft Tissue  Date/Time: 10/16/2019 2:57 PM Performed by: ASuzy Bouchard PA-C Authorized by: ASuzy Bouchard PA-C   Procedure details:    Indications: localization of abscess and evaluate for cellulitis     Transverse view:  Visualized   Longitudinal view:  Visualized   Images: archived     Limitations:  Body habitus Location:    Location: upper extremity     Side:  Left Findings:     no abscess present    no cellulitis present   (including critical care time)  Medications Ordered in ED Medications - No data to display  ED Course  I have reviewed the triage vital signs and  the nursing notes.  Pertinent labs & imaging results that were available during my care of the patient were reviewed by me and considered in my medical decision making (see chart for details).    MDM Rules/Calculators/A&P  44 year old female presents to the ED due to left upper extremity pain and edema after receiving her tetanus vaccine 2 days prior. Vitals all within normal limits. 3x4cm area of induration. No drainage.  No fluctuance. bedside ultrasound negative for abscess formation. No fever or chills. Suspect injection site reaction. Discharged patient with ibuprofen. Advised patient to use warm compresses in the area. Instructed patient to follow-up with PCP if symptoms do not improve within the next week. Strict ED precautions discussed with patient. Patient states understanding and agrees to plan. Patient discharged home in no acute distress and stable vitals.   Final Clinical Impression(s) / ED Diagnoses Final diagnoses:  Injection site reaction, initial encounter    Rx / DC Orders ED Discharge Orders         Ordered    ibuprofen (ADVIL) 600 MG tablet  Every 6 hours PRN     10/16/19 1453           Karie Kirks 10/16/19 1458    Gareth Morgan, MD 10/17/19 2234

## 2019-10-19 ENCOUNTER — Encounter: Payer: Self-pay | Admitting: Family Medicine

## 2019-10-30 ENCOUNTER — Emergency Department (HOSPITAL_BASED_OUTPATIENT_CLINIC_OR_DEPARTMENT_OTHER): Payer: Self-pay

## 2019-10-30 ENCOUNTER — Inpatient Hospital Stay (HOSPITAL_BASED_OUTPATIENT_CLINIC_OR_DEPARTMENT_OTHER)
Admission: EM | Admit: 2019-10-30 | Discharge: 2019-11-02 | DRG: 281 | Disposition: A | Discharge status: Home / Self Care | Payer: Self-pay | Attending: Internal Medicine | Admitting: Internal Medicine

## 2019-10-30 ENCOUNTER — Other Ambulatory Visit: Payer: Self-pay

## 2019-10-30 ENCOUNTER — Encounter (HOSPITAL_BASED_OUTPATIENT_CLINIC_OR_DEPARTMENT_OTHER): Payer: Self-pay

## 2019-10-30 DIAGNOSIS — E119 Type 2 diabetes mellitus without complications: Secondary | ICD-10-CM | POA: Diagnosis present

## 2019-10-30 DIAGNOSIS — Z79899 Other long term (current) drug therapy: Secondary | ICD-10-CM

## 2019-10-30 DIAGNOSIS — I214 Non-ST elevation (NSTEMI) myocardial infarction: Secondary | ICD-10-CM

## 2019-10-30 DIAGNOSIS — Z7984 Long term (current) use of oral hypoglycemic drugs: Secondary | ICD-10-CM

## 2019-10-30 DIAGNOSIS — Z9049 Acquired absence of other specified parts of digestive tract: Secondary | ICD-10-CM

## 2019-10-30 DIAGNOSIS — I2510 Atherosclerotic heart disease of native coronary artery without angina pectoris: Secondary | ICD-10-CM | POA: Diagnosis present

## 2019-10-30 DIAGNOSIS — E785 Hyperlipidemia, unspecified: Secondary | ICD-10-CM | POA: Diagnosis present

## 2019-10-30 DIAGNOSIS — Z6841 Body Mass Index (BMI) 40.0 and over, adult: Secondary | ICD-10-CM

## 2019-10-30 DIAGNOSIS — E876 Hypokalemia: Secondary | ICD-10-CM | POA: Diagnosis present

## 2019-10-30 DIAGNOSIS — Z20822 Contact with and (suspected) exposure to covid-19: Secondary | ICD-10-CM | POA: Diagnosis present

## 2019-10-30 DIAGNOSIS — I10 Essential (primary) hypertension: Secondary | ICD-10-CM | POA: Diagnosis present

## 2019-10-30 DIAGNOSIS — I249 Acute ischemic heart disease, unspecified: Secondary | ICD-10-CM

## 2019-10-30 HISTORY — DX: Non-ST elevation (NSTEMI) myocardial infarction: I21.4

## 2019-10-30 LAB — CBC
HCT: 38.4 % (ref 36.0–46.0)
Hemoglobin: 12.4 g/dL (ref 12.0–15.0)
MCH: 28.1 pg (ref 26.0–34.0)
MCHC: 32.3 g/dL (ref 30.0–36.0)
MCV: 87.1 fL (ref 80.0–100.0)
Platelets: 382 10*3/uL (ref 150–400)
RBC: 4.41 MIL/uL (ref 3.87–5.11)
RDW: 13.3 % (ref 11.5–15.5)
WBC: 7.9 10*3/uL (ref 4.0–10.5)
nRBC: 0 % (ref 0.0–0.2)

## 2019-10-30 LAB — BASIC METABOLIC PANEL
Anion gap: 10 (ref 5–15)
BUN: 9 mg/dL (ref 6–20)
CO2: 26 mmol/L (ref 22–32)
Calcium: 8.9 mg/dL (ref 8.9–10.3)
Chloride: 100 mmol/L (ref 98–111)
Creatinine, Ser: 0.87 mg/dL (ref 0.44–1.00)
GFR calc Af Amer: >60 mL/min (ref >60)
GFR calc non Af Amer: >60 mL/min (ref >60)
Glucose, Bld: 148 mg/dL — ABNORMAL HIGH (ref 70–99)
Potassium: 3.3 mmol/L — ABNORMAL LOW (ref 3.5–5.1)
Sodium: 136 mmol/L (ref 135–145)

## 2019-10-30 LAB — CBG MONITORING, ED: Glucose-Capillary: 136 mg/dL — ABNORMAL HIGH (ref 70–99)

## 2019-10-30 LAB — APTT: aPTT: 28 seconds (ref 24–36)

## 2019-10-30 LAB — HEMOGLOBIN A1C
Hgb A1c MFr Bld: 7.6 % — ABNORMAL HIGH (ref 4.8–5.6)
Mean Plasma Glucose: 171.42 mg/dL

## 2019-10-30 LAB — SARS CORONAVIRUS 2 BY RT PCR (HOSPITAL ORDER, PERFORMED IN ~~LOC~~ HOSPITAL LAB): SARS Coronavirus 2: NEGATIVE

## 2019-10-30 LAB — TROPONIN I (HIGH SENSITIVITY)
Troponin I (High Sensitivity): 200 ng/L (ref <18)
Troponin I (High Sensitivity): 148 ng/L (ref <18)
Troponin I (High Sensitivity): 122 ng/L (ref <18)

## 2019-10-30 LAB — HEPARIN LEVEL (UNFRACTIONATED): Heparin Unfractionated: 0.26 IU/mL — ABNORMAL LOW (ref 0.30–0.70)

## 2019-10-30 LAB — PROTIME-INR
INR: 1 (ref 0.8–1.2)
Prothrombin Time: 12.4 seconds (ref 11.4–15.2)

## 2019-10-30 MED ORDER — ASPIRIN 81 MG PO CHEW
81.0000 mg | CHEWABLE_TABLET | ORAL | Status: AC
  Administered 2019-10-31: 81 mg via ORAL
  Filled 2019-10-30: qty 1

## 2019-10-30 MED ORDER — SODIUM CHLORIDE 0.9 % WEIGHT BASED INFUSION
3.0000 mL/kg/h | INTRAVENOUS | Status: AC
  Administered 2019-10-31: 3 mL/kg/h via INTRAVENOUS

## 2019-10-30 MED ORDER — SODIUM CHLORIDE 0.9% FLUSH
3.0000 mL | Freq: Once | INTRAVENOUS | Status: AC
  Filled 2019-10-30: qty 3

## 2019-10-30 MED ORDER — FENTANYL CITRATE (PF) 100 MCG/2ML IJ SOLN
50.0000 ug | Freq: Once | INTRAMUSCULAR | Status: AC
  Administered 2019-10-30: 50 ug via INTRAVENOUS
  Filled 2019-10-30: qty 2

## 2019-10-30 MED ORDER — NITROGLYCERIN 0.4 MG SL SUBL: 0.4000 mg | SUBLINGUAL_TABLET | SUBLINGUAL | Status: DC | PRN

## 2019-10-30 MED ORDER — SODIUM CHLORIDE 0.9% FLUSH
3.0000 mL | Freq: Two times a day (BID) | INTRAVENOUS | Status: DC
  Administered 2019-10-30 – 2019-11-01 (×5): 3 mL via INTRAVENOUS

## 2019-10-30 MED ORDER — CARVEDILOL 3.125 MG PO TABS
3.1250 mg | ORAL_TABLET | Freq: Two times a day (BID) | ORAL | Status: DC
  Administered 2019-10-31: 3.125 mg via ORAL
  Filled 2019-10-30 (×2): qty 1

## 2019-10-30 MED ORDER — HYDROCHLOROTHIAZIDE 12.5 MG PO CAPS
12.5000 mg | ORAL_CAPSULE | Freq: Every day | ORAL | Status: DC
  Administered 2019-10-30 – 2019-11-01 (×3): 12.5 mg via ORAL
  Filled 2019-10-30 (×3): qty 1

## 2019-10-30 MED ORDER — HEPARIN BOLUS VIA INFUSION
4000.0000 [IU] | Freq: Once | INTRAVENOUS | Status: AC
  Administered 2019-10-30: 4000 [IU] via INTRAVENOUS

## 2019-10-30 MED ORDER — ONDANSETRON HCL 4 MG/2ML IJ SOLN
4.0000 mg | Freq: Four times a day (QID) | INTRAMUSCULAR | Status: DC | PRN
  Administered 2019-10-31: 4 mg via INTRAVENOUS
  Filled 2019-10-30: qty 2

## 2019-10-30 MED ORDER — HEPARIN (PORCINE) 25000 UT/250ML-% IV SOLN
1100.0000 [IU]/h | INTRAVENOUS | Status: DC
  Administered 2019-10-30: 900 [IU]/h via INTRAVENOUS
  Administered 2019-10-31 – 2019-11-01 (×2): 1050 [IU]/h via INTRAVENOUS
  Filled 2019-10-30 (×3): qty 250

## 2019-10-30 MED ORDER — SODIUM CHLORIDE 0.9% FLUSH: 3.0000 mL | INTRAVENOUS | Status: DC | PRN

## 2019-10-30 MED ORDER — IRBESARTAN 150 MG PO TABS
75.0000 mg | ORAL_TABLET | Freq: Every day | ORAL | Status: DC
  Administered 2019-10-30 – 2019-11-02 (×4): 75 mg via ORAL
  Filled 2019-10-30 (×4): qty 1

## 2019-10-30 MED ORDER — ACETAMINOPHEN 325 MG PO TABS: 650.0000 mg | ORAL_TABLET | ORAL | Status: DC | PRN

## 2019-10-30 MED ORDER — POTASSIUM CHLORIDE CRYS ER 20 MEQ PO TBCR
40.0000 meq | EXTENDED_RELEASE_TABLET | Freq: Once | ORAL | Status: AC
  Administered 2019-10-30: 40 meq via ORAL
  Filled 2019-10-30: qty 2

## 2019-10-30 MED ORDER — ASPIRIN 81 MG PO CHEW
324.0000 mg | CHEWABLE_TABLET | Freq: Once | ORAL | Status: AC
  Administered 2019-10-30: 324 mg via ORAL
  Filled 2019-10-30: qty 4

## 2019-10-30 MED ORDER — NITROGLYCERIN 0.4 MG SL SUBL
0.4000 mg | SUBLINGUAL_TABLET | SUBLINGUAL | Status: DC | PRN
  Administered 2019-10-30 (×3): 0.4 mg via SUBLINGUAL
  Filled 2019-10-30: qty 1

## 2019-10-30 MED ORDER — LORATADINE 10 MG PO TABS
10.0000 mg | ORAL_TABLET | Freq: Every day | ORAL | Status: DC
  Administered 2019-10-30 – 2019-11-01 (×3): 10 mg via ORAL
  Filled 2019-10-30 (×3): qty 1

## 2019-10-30 MED ORDER — SODIUM CHLORIDE 0.9 % IV SOLN: 250.0000 mL | INTRAVENOUS | Status: DC | PRN

## 2019-10-30 MED ORDER — ASPIRIN EC 81 MG PO TBEC
81.0000 mg | DELAYED_RELEASE_TABLET | Freq: Every day | ORAL | Status: DC
  Administered 2019-10-31 – 2019-11-02 (×3): 81 mg via ORAL
  Filled 2019-10-30 (×2): qty 1

## 2019-10-30 MED ORDER — ATORVASTATIN CALCIUM 80 MG PO TABS
80.0000 mg | ORAL_TABLET | Freq: Every day | ORAL | Status: DC
  Administered 2019-10-30 – 2019-11-01 (×3): 80 mg via ORAL
  Filled 2019-10-30 (×3): qty 1

## 2019-10-30 MED ORDER — SODIUM CHLORIDE 0.9 % WEIGHT BASED INFUSION: 1.0000 mL/kg/h | INTRAVENOUS | Status: DC

## 2019-10-30 MED ORDER — HEPARIN (PORCINE) 25000 UT/250ML-% IV SOLN
INTRAVENOUS | Status: AC
  Filled 2019-10-30: qty 250

## 2019-10-30 MED ORDER — INSULIN ASPART 100 UNIT/ML ~~LOC~~ SOLN
0.0000 [IU] | Freq: Three times a day (TID) | SUBCUTANEOUS | Status: DC
  Administered 2019-10-31 (×2): 2 [IU] via SUBCUTANEOUS
  Administered 2019-10-31: 3 [IU] via SUBCUTANEOUS
  Administered 2019-11-01 (×2): 2 [IU] via SUBCUTANEOUS
  Administered 2019-11-02: 3 [IU] via SUBCUTANEOUS

## 2019-10-30 NOTE — ED Notes (Signed)
Pt on monitor 

## 2019-10-30 NOTE — ED Notes (Signed)
EDP in triage to talk with pt

## 2019-10-30 NOTE — ED Notes (Signed)
Contacted Dr. Tresa Endo for consult at Dr. Letha Cape request

## 2019-10-30 NOTE — Progress Notes (Signed)
ANTICOAGULATION CONSULT NOTE - Initial Consult  Pharmacy Consult for heparin Indication: chest pain/ACS  Allergies  Allergen Reactions  . Amlodipine Hives  . Hydrocodone-Acetaminophen Hives and Itching    And itching Itching and hives  . Losartan Potassium-Hctz Other (See Comments)    Dizziness  . Tetanus Toxoids     Arm swelling size of a softball.     Patient Measurements: Height: 5\' 2"  (157.5 cm) Weight: Amy.3 kg (232 lb 3.2 oz) IBW/kg (Calculated) : 50.1 Heparin Dosing Weight: 75kg  Vital Signs: Temp: 98.3 F (36.8 C) (06/04 1431) Temp Source: Oral (06/04 1431) BP: 157/91 (06/04 1645) Pulse Rate: 81 (06/04 1645)  Labs: Recent Labs    10/30/19 1449 10/30/19 1635 10/30/19 2137  HGB 12.4  --   --   HCT 38.4  --   --   PLT 382  --   --   APTT 28  --   --   LABPROT 12.4  --   --   INR 1.0  --   --   HEPARINUNFRC  --   --  0.26*  CREATININE 0.87  --   --   TROPONINIHS 200* 148*  --     Estimated Creatinine Clearance: 92.1 mL/min (by C-G formula based on SCr of 0.87 mg/dL).   Medical History: Past Medical History:  Diagnosis Date  . Anemia   . Diabetes mellitus without complication (HCC)   . Hypertension    Assessment: 44 yo [name] started on heparin for NSTEMI. No AC PTA. H/H, plt stable.   First HL subtherapeutic at 0.26 on 900 units/hr and 4000 unit bolus.  Goal of Therapy:  Heparin level 0.3-0.7 units/ml Monitor platelets by anticoagulation protocol: Yes   Plan:  Increase heparin to 1050 units/hr Monitor daily HL, CBC/plt Monitor for signs/symptoms of bleeding  F/u Cath results    47, PharmD, BCPS, BCCP Clinical Pharmacist  Please check AMION for all [facility] phone numbers After 10:00 PM, call Main Pharmacy [phone]

## 2019-10-30 NOTE — ED Notes (Signed)
Contacted [facility] Benetta Spar) - inpatient cardiology

## 2019-10-30 NOTE — ED Notes (Signed)
ED Provider at bedside. 

## 2019-10-30 NOTE — ED Notes (Signed)
[facility] notified Amy [name]) - patient ready for transport to [facility] ED

## 2019-10-30 NOTE — ED Provider Notes (Signed)
Care of the patient assumed on transfer from [facility]: Amy [name] for NSTEMI. Patient is pain free now. Has had decreased Trop on second draw. Will discuss with Cardiology.   Physical Exam  BP (!) 157/91   Pulse 81   Temp 98.3 F (36.8 C) (Oral)   Resp (!) 24   Ht 5\' 2"  (1.575 m)   Wt 105.3 kg   SpO2 100%   BMI 42.47 kg/m   Physical Exam Resting comfortably Non-toxic appearing RRR CTAB  ED Course/Procedures     Procedures  MDM  Spoke with Dr. , on call for Cardiology, who will admit for further evaluation.      Rennis Golden, MD 10/30/19 1929

## 2019-10-30 NOTE — ED Triage Notes (Signed)
Pt c/o CP started yesterday-pain to chest "is tender and hurts when I breathe"-states she also had sneezing and a cough yesterday-NAD-steady gait

## 2019-10-30 NOTE — ED Notes (Signed)
Contacted [facility] [phone] for cardiology consult

## 2019-10-30 NOTE — Progress Notes (Signed)
History & Physical    Patient ID: Amy [name] MRN: [id number], DOB/AGE: 46-08-1975   Admit date: 10/30/2019   Primary Physician: Antony Blackbird, MD Primary Cardiologist: Pixie Casino, MD  Patient Profile    44 y/o ? w/ a h/o anemia, diabetes, obesity, and HTN, who presents on transfer from [place] secondary to chest pain and non-STEMI.  Past Medical History    Past Medical History:  Diagnosis Date  . Anemia   . Diabetes mellitus without complication ([place])   . Hypertension     Past Surgical History:  Procedure Laterality Date  . CHOLECYSTECTOMY  2006  . DILITATION & CURRETTAGE/HYSTROSCOPY WITH HYDROTHERMAL ABLATION N/A 07/10/2013   Procedure: DILATATION & CURETTAGE/HYSTEROSCOPY WITH HYDROTHERMAL ABLATION;  Surgeon: Shelly Bombard, MD;  Location: [facility];  Service: Gynecology;  Laterality: N/A;  . TUBAL LIGATION  1997  . WISDOM TOOTH EXTRACTION  1998     Allergies  Allergies  Allergen Reactions  . Amlodipine Hives  . Hydrocodone-Acetaminophen Hives and Itching    And itching Itching and hives  . Losartan Potassium-Hctz Other (See Comments)    Dizziness  . Tetanus Toxoids     Arm swelling size of a softball.     History of Present Illness    44 y/o ? w/ a h/o anemia, DM, and HTN.  She was in her USOH until yesterday, when she started experiencing retrosternal chest discomfort that occurred intermittently throughout the day.  Symptoms finally resolved in the evening after taking over-the-counter analgesic and she was able to sleep well.  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  EMS ECG showed deep TWI in I, II, aVL, V2-V6.  She was taken to the [facility] where she was hypertensive.  High-sensitivity troponin returned elevated at 200 with delta troponin of 148.  She was placed on heparin and subsequently transferred to the [facility] ED where she is currently chest pain-free.  Home  Medications    Prior to Admission medications   Medication Sig Start Date End Date Taking? Authorizing Provider  Blood Glucose Monitoring Suppl (TRUE METRIX METER) w/Device KIT Use to check blood sugars twice per day Patient taking differently: 1 each by Other route See admin instructions. Use to check blood sugars twice per day 08/20/19  Yes Fulp, Cammie, MD  fexofenadine-pseudoephedrine (ALLEGRA-D 24) 180-240 MG 24 hr tablet Take 1 tablet by mouth daily.   Yes [provider]  glucose blood (TRUE METRIX BLOOD GLUCOSE TEST) test strip Use as instructed to check BS twice per day Patient taking differently: 1 each by Other route See admin instructions. Use as instructed to check BS twice per day 08/20/19  Yes Fulp, Cammie, MD  ibuprofen (ADVIL) 600 MG tablet Take 1 tablet (600 mg total) by mouth every 6 (six) hours as needed. Patient taking differently: Take 600 mg by mouth every 6 (six) hours as needed for mild pain.  10/16/19  Yes Aberman, Druscilla Brownie, PA-C  metFORMIN (GLUCOPHAGE-XR) 500 MG 24 hr tablet Take 2 tablets (1,000 mg total) by mouth daily after supper. 09/11/19  Yes Fulp, Cammie, MD  Multiple Vitamin (MULTIVITAMIN WITH MINERALS) TABS tablet Take 1 tablet by mouth daily.   Yes [provider]  ondansetron (ZOFRAN ODT) 4 MG disintegrating tablet 45m ODT q4 hours prn nausea/vomit Patient taking differently: Take 4 mg by mouth every 4 (four) hours as needed for nausea or vomiting.  09/29/19  Yes Milton Ferguson, MD  TRUEplus Lancets 28G MISC Use when checking BS twice per day Patient taking differently: 1 each by Other route See admin instructions. Use when checking BS twice per day 08/20/19  Yes Fulp, Cammie, MD  valsartan-hydrochlorothiazide (DIOVAN-HCT) 80-12.5 MG tablet Take 1 tablet by mouth daily. 10/14/19  Yes Fulp, Cammie, MD  ibuprofen (ADVIL) 600 MG tablet Take 1 tablet (600 mg total) by mouth every 6 (six) hours as needed. Patient not taking: Reported on 10/30/2019 07/31/19    Isla Pence, MD    Family History    Family History  Problem Relation Age of Onset  . Breast cancer Mother   . Hypertension Mother   . COPD Other   . Hypertension Other   . Diabetes Other   . Cancer Other   . Heart failure Maternal Aunt    She indicated that her mother is alive. She indicated that her father is alive. She indicated that the status of her maternal aunt is unknown.   Social History    Social History   Socioeconomic History  . Marital status: Married    Spouse name: Jerald Kief  . Number of children: 2  . Years of education: Not on file  . Highest education level: Some college, no degree  Occupational History  . 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 Smoking status: Never Smoker  . Smokeless tobacco: Never Used  Substance and Sexual Activity  . Alcohol use: No  . Drug use: No  . Sexual activity: Not on file  Other Topics Concern  . Not on file  Social History Narrative   Lives locally.  Does not routinely exercise.  In school @ [employer] for nsg.   Social Determinants of Health   Financial Resource Strain:   . Difficulty of Paying Living Expenses:   Food Insecurity:   . Worried About Charity fundraiser in the Last Year:   . Arboriculturist in the Last Year:   Transportation Needs:   . Film/video editor (Medical):   Marland Kitchen Lack of Transportation (Non-Medical):   Physical Activity:   . Days of Exercise per Week:   . Minutes of Exercise per Session:   Stress:   . Feeling of Stress :   Social Connections:   . Frequency of Communication with Friends and Family:   . Frequency of Social Gatherings with Friends and Family:   . Attends Religious Services:   . Active Member of Clubs or Organizations:   . Attends Archivist Meetings:   Marland Kitchen Marital Status:   Intimate Partner Violence:   . Fear of Current or Ex-Partner:   . Emotionally Abused:   Marland Kitchen Physically Abused:   . Sexually Abused:       Review of Systems    General:  No chills, fever, night sweats or weight changes.  Cardiovascular:  +++ chest pain, no dyspnea on exertion, edema, orthopnea, palpitations, paroxysmal nocturnal dyspnea. Dermatological: No rash, lesions/masses Respiratory: No cough, dyspnea Urologic: No hematuria, dysuria Abdominal:   No nausea, vomiting, diarrhea, bright red blood per rectum, melena, or hematemesis Neurologic:  No visual changes, wkns, changes in mental status. All other systems reviewed and are otherwise negative except as noted above.  Physical Exam    Blood pressure (!) 157/91, pulse 81, temperature 98.3 F (36.8 C), temperature source Oral, resp. rate (!) 24, height 5' 2"  (1.575 m), weight 105.3 kg, SpO2 100 %.  General: Pleasant, NAD Psych: Normal affect. Neuro: Alert and oriented X 3. Moves all extremities spontaneously. HEENT: Normal  Neck: Supple without bruits or JVD. Lungs:  Resp regular and unlabored, CTA. Heart: RRR no s3, s4, or murmurs. Abdomen: Obese, soft, non-tender, non-distended, BS + x 4.  Extremities: No clubbing, cyanosis or edema. DP/PT/Radials 2+ and equal bilaterally.  Labs    Cardiac Enzymes Recent Labs  Lab 10/30/19 1449 10/30/19 1635  TROPONINIHS 200* 148*      Lab Results  Component Value Date   WBC 7.9 10/30/2019   HGB 12.4 10/30/2019   HCT 38.4 10/30/2019   MCV 87.1 10/30/2019   PLT 382 10/30/2019    Recent Labs  Lab 10/30/19 1449  NA 136  K 3.3*  CL 100  CO2 26  BUN 9  CREATININE 0.87  CALCIUM 8.9  GLUCOSE 148*    Radiology Studies    DG Chest Portable 1 View  Result Date: 10/30/2019 CLINICAL DATA:  Chest pain. Additional history provided: Chest tenderness, pain with breathing, sneezing and coughing yesterday EXAM: PORTABLE CHEST 1 VIEW COMPARISON:  Chest radiograph 07/30/2019 FINDINGS: Heart size at the upper limits of normal, unchanged. Minimal opacity within the medial right lung base has an appearance most suggestive  of atelectasis. No definite airspace consolidation. No frank pulmonary edema. No evidence of pleural effusion or pneumothorax. No acute bony abnormality identified. IMPRESSION: Minimal ill-defined opacity within the medial right lung base has an appearance most suggestive of atelectasis. No definite airspace consolidation. Heart size at the upper limits of normal, unchanged. Electronically Signed   By: Kellie Simmering DO   On: 10/30/2019 15:41    ECG & Cardiac Imaging    RSR, 84, deep TWI in I, II, aVL, V2-V6.   - personally reviewed.  Assessment & Plan    1.  NSTEMI:  Pt w/ h/o HTN, obesity, and DM, who has been experiencing intermittent chest pain since 6/3, which worsened today prompting call to EMS.  ECG markedly abnormal with deep T wave inversion in anterolateral leads, which is new.  Chest pain has since resolved.  Initial high-sensitivity troponin was elevated at 200.  Subsequent 148.  She is on heparin.  I will add aspirin, high potency statin, and beta-blocker therapy.  Admit to progressive care.  Follow-up echocardiogram.  Plan diagnostic catheterization on Monday.  2.  Essential hypertension: Blood pressure elevated since arrival.  Continue home dose of valsartan-HCTZ.  Adding beta-blocker in the setting of acute coronary syndrome.  3.  Lipid status: Currently unknown.  Adding high potency statin.  Follow-up lipids and LFTs.  4.  Type 2 diabetes mellitus: We will + scale insulin.  Hold home dose of Metformin.  5.  Hypokalemia: Supplement.  Signed, Murray Hodgkins, NP 10/30/2019, 8:19 PM

## 2019-10-30 NOTE — Progress Notes (Signed)
ANTICOAGULATION CONSULT NOTE - Initial Consult  Pharmacy Consult for heparin Indication: chest pain/ACS  Allergies  Allergen Reactions  . Amlodipine Hives  . Hydrocodone-Acetaminophen Hives and Itching    And itching Itching and hives  . Losartan Potassium-Hctz Other (See Comments)    Dizziness    Patient Measurements: Height: 5\' 2"  (157.5 cm) Weight: 105.3 kg (232 lb 3.2 oz) IBW/kg (Calculated) : 50.1 Heparin Dosing Weight: 75  Vital Signs: Temp: 98.3 F (36.8 C) (06/04 1431) Temp Source: Oral (06/04 1431) BP: 138/77 (06/04 1510) Pulse Rate: 91 (06/04 1510)  Labs: Recent Labs    10/30/19 1449  HGB 12.4  HCT 38.4  PLT 382  APTT 28  LABPROT 12.4  INR 1.0  CREATININE 0.87    Estimated Creatinine Clearance: 92.1 mL/min (by C-G formula based on SCr of 0.87 mg/dL).   Medical History: Past Medical History:  Diagnosis Date  . Anemia   . Diabetes mellitus without complication (HCC)   . Hypertension       Goal of Therapy:  Heparin level 0.3-0.7 units/ml Monitor platelets by anticoagulation protocol: Yes   Plan:  Give 4000 units bolus x 1 Start heparin infusion at 900 units/hr Check anti-Xa level in 6 hours and daily while on heparin Continue to monitor H&H and platelets  12/30/19 A Brenan Mod[place] 10/30/2019,3:32 PM

## 2019-10-30 NOTE — ED Notes (Signed)
Portable chest 

## 2019-10-30 NOTE — ED Provider Notes (Signed)
[facility] EMERGENCY DEPARTMENT Provider Note   CSN: [id number] Arrival date & time: 10/30/19  1423     History Chief Complaint  Patient presents with  . Chest Pain    Amy [name] is a 44 y.o. female.  Presents to ER with concern for chest pain.  Pain occurred yesterday evening, said that it was tender to touch and hurt when she took a deep breath.  Took some Aleve and pain seemed to subside, woke up this morning and did not have any symptoms.  This afternoon however noted steadily worsening chest pain, occurring at rest and with exertion, described as a sharp, stabbing sensation in center of her chest.  No other associated symptoms.  Denies prior cardiac history, does have history of diabetes, hypertension.  No family history of CAD.  HPI     Past Medical History:  Diagnosis Date  . Anemia   . Diabetes mellitus without complication ([place])   . Hypertension     Patient Active Problem List   Diagnosis Date Noted  . Excessive or frequent menstruation 09/10/2012    Past Surgical History:  Procedure Laterality Date  . CHOLECYSTECTOMY  2006  . DILITATION & CURRETTAGE/HYSTROSCOPY WITH HYDROTHERMAL ABLATION N/A 07/10/2013   Procedure: DILATATION & CURETTAGE/HYSTEROSCOPY WITH HYDROTHERMAL ABLATION;  Surgeon: Shelly Bombard, MD;  Location: [facility];  Service: Gynecology;  Laterality: N/A;  . TUBAL LIGATION  1997  . WISDOM TOOTH EXTRACTION  1998     OB History    Gravida  2   Para  2   Term  2   Preterm      AB      Living  2     SAB      TAB      Ectopic      Multiple      Live Births  2           Family History  Problem Relation Age of Onset  . Breast cancer Mother   . Hypertension Mother   . COPD Other   . Hypertension Other   . Diabetes Other   . Cancer Other     Social History   Tobacco Use  . Smoking status: Never Smoker  . Smokeless tobacco: Never Used  Substance Use Topics  . Alcohol use: No  . Drug use: No     Home Medications Prior to Admission medications   Medication Sig Start Date End Date Taking? Authorizing Provider  Blood Glucose Monitoring Suppl (TRUE METRIX METER) w/Device KIT Use to check blood sugars twice per day 08/20/19   Fulp, Cammie, MD  fexofenadine-pseudoephedrine (ALLEGRA-D 24) 180-240 MG 24 hr tablet Take 1 tablet by mouth daily.    [provider]  glucose blood (TRUE METRIX BLOOD GLUCOSE TEST) test strip Use as instructed to check BS twice per day 08/20/19   Fulp, Cammie, MD  ibuprofen (ADVIL) 600 MG tablet Take 1 tablet (600 mg total) by mouth every 6 (six) hours as needed. Patient taking differently: Take 600 mg by mouth every 6 (six) hours as needed for headache or moderate pain.  07/31/19   Isla Pence, MD  ibuprofen (ADVIL) 600 MG tablet Take 1 tablet (600 mg total) by mouth every 6 (six) hours as needed. 10/16/19   Suzy Bouchard, PA-C  metFORMIN (GLUCOPHAGE-XR) 500 MG 24 hr tablet Take 2 tablets (1,000 mg total) by mouth daily after supper. 09/11/19   Fulp, Ander Gaster, MD  Multiple Vitamin (MULTIVITAMIN WITH MINERALS)  TABS tablet Take 1 tablet by mouth daily.    [provider]  naproxen sodium (ALEVE) 220 MG tablet Take 220 mg by mouth 2 (two) times daily as needed (headache/pain).    [provider]  ondansetron (ZOFRAN ODT) 4 MG disintegrating tablet 71m ODT q4 hours prn nausea/vomit 09/29/19   ZMilton Ferguson MD  TRUEplus Lancets 28G MISC Use when checking BS twice per day 08/20/19   Fulp, Cammie, MD  valsartan-hydrochlorothiazide (DIOVAN-HCT) 80-12.5 MG tablet Take 1 tablet by mouth daily. 10/14/19   Fulp, Cammie, MD    Allergies    Amlodipine, Hydrocodone-acetaminophen, and Losartan potassium-hctz  Review of Systems   Review of Systems  Constitutional: Negative for chills and fever.  HENT: Negative for ear pain and sore throat.   Eyes: Negative for pain and visual disturbance.  Respiratory: Negative for cough and shortness of breath.    Cardiovascular: Positive for chest pain and palpitations.  Gastrointestinal: Positive for nausea. Negative for abdominal pain and vomiting.  Genitourinary: Negative for dysuria and hematuria.  Musculoskeletal: Negative for arthralgias and back pain.  Skin: Negative for color change and rash.  Neurological: Negative for seizures and syncope.  All other systems reviewed and are negative.   Physical Exam Updated Vital Signs BP (!) 179/93   Pulse 69   Temp 98.3 F (36.8 C) (Oral)   Resp 15   Ht _0  (1.575 m)   Wt 105.3 kg   SpO2 98%   BMI 42.47 kg/m   Physical Exam Vitals and nursing note reviewed.  Constitutional:      General: She is not in acute distress.    Appearance: She is well-developed.  HENT:     Head: Normocephalic and atraumatic.  Eyes:     Conjunctiva/sclera: Conjunctivae normal.  Cardiovascular:     Rate and Rhythm: Normal rate and regular rhythm.     Heart sounds: No murmur.  Pulmonary:     Effort: Pulmonary effort is normal. No respiratory distress.     Breath sounds: Normal breath sounds.  Abdominal:     Palpations: Abdomen is soft.     Tenderness: There is no abdominal tenderness.  Musculoskeletal:     Cervical back: Neck supple.     Right lower leg: No tenderness. No edema.     Left lower leg: No tenderness. No edema.  Skin:    General: Skin is warm and dry.     Capillary Refill: Capillary refill takes less than 2 seconds.  Neurological:     General: No focal deficit present.     Mental Status: She is alert.     ED Results / Procedures / Treatments   Labs (all labs ordered are listed, but only abnormal results are displayed) Labs Reviewed  BASIC METABOLIC PANEL - Abnormal; Notable for the following components:      Result Value   Potassium 3.3 (*)    Glucose, Bld 148 (*)    All other components within normal limits  CBG MONITORING, ED - Abnormal; Notable for the following components:   Glucose-Capillary 136 (*)    All other components  within normal limits  TROPONIN I (HIGH SENSITIVITY) - Abnormal; Notable for the following components:   Troponin I (High Sensitivity) 200 (*)    All other components within normal limits  SARS CORONAVIRUS 2 BY RT PCR (HOSPITAL ORDER, [facility])  CBC  APTT  PROTIME-INR  PREGNANCY, URINE  HEPARIN LEVEL (UNFRACTIONATED)  TROPONIN I (HIGH SENSITIVITY)  EKG EKG Interpretation  Date/Time:  Friday October 30 2019 15:05:52 EDT Ventricular Rate:  88 PR Interval:  144 QRS Duration: 98 QT Interval:  432 QTC Calculation: 523 R Axis:   63 Text Interpretation: Sinus rhythm Probable left atrial enlargement Abnormal T, consider ischemia, diffuse leads Prolonged QT interval Confirmed by Madalyn Rob [phone]) on 10/30/2019 4:11:04 PM   Radiology DG Chest Portable 1 View  Result Date: 10/30/2019 CLINICAL DATA:  Chest pain. Additional history provided: Chest tenderness, pain with breathing, sneezing and coughing yesterday EXAM: PORTABLE CHEST 1 VIEW COMPARISON:  Chest radiograph 07/30/2019 FINDINGS: Heart size at the upper limits of normal, unchanged. Minimal opacity within the medial right lung base has an appearance most suggestive of atelectasis. No definite airspace consolidation. No frank pulmonary edema. No evidence of pleural effusion or pneumothorax. No acute bony abnormality identified. IMPRESSION: Minimal ill-defined opacity within the medial right lung base has an appearance most suggestive of atelectasis. No definite airspace consolidation. Heart size at the upper limits of normal, unchanged. Electronically Signed   By: Kellie Simmering DO   On: 10/30/2019 15:41    Procedures Procedures (including critical care time)  Medications Ordered in ED Medications  nitroGLYCERIN (NITROSTAT) SL tablet 0.4 mg (0.4 mg Sublingual Given 10/30/19 1504)  heparin ADULT infusion 100 units/mL (25000 units/212m sodium chloride 0.45%) (900 Units/hr Intravenous New Bag/Given 10/30/19  1525)  sodium chloride flush (NS) 0.9 % injection 3 mL ([facility]s Intravenous [facility]  aspirin chewable tablet 324 mg (324 mg Oral Given 10/30/19 1454)  fentaNYL (SUBLIMAZE) injection 50 mcg (50 mcg Intravenous Given 10/30/19 1512)  heparin bolus via infusion 4,000 Units (4,000 Units Intravenous Bolus from Bag 10/30/19 1527)    ED Course  I have reviewed the triage vital signs and the nursing notes.  Pertinent labs & imaging results that were available during my care of the patient were reviewed by me and considered in my medical decision making (see chart for details).  Clinical Course as of Oct 29 1629  Fri Oct 30, 2019  1437 Reviewed initial ekg, ischemic t waves, went to assess patient 10/10 chest pain, will repeat ekg and page STEMI doc   [RD]  1500 D/w Hank Smith - does not meet STEMI criteria, treat as ACS, admit to cardiology - likely cath tomorrow am   [RD]  1516 Recheck, pain down to 2/10 after meds   [RD]  1544 Will c/s in pt cards team  Troponin I (High Sensitivity)(!!): 200 [RD]  1544 Pain currently 0/10   [RD]  [facility] will check if [facility] has interventional cards available   [RD]  1628 D/w SKarle Starch- [facility]  [RD]    Clinical Course User Index [RD] DLucrezia Starch MD   MDM Rules/Calculators/A&P                      44year old lady with history of diabetes, hypertension presenting to ER with chest pain.  On arrival here patient was well-appearing, vital signs stable.  EKG had very concerning diffuse T wave inversions.  Discussed case promptly with Dr. STamala Julianon-call for STEMI, does not meet STEMI criteria.  Started heparin, gave nitro, aspirin, patient's pain resolved.  Troponin elevated, presentation consistent with ACS/NSTEMI.  [facility] or appropriate bed at [facility].  Will transfer to [facility] ER to ER. Cardiology needs to be notified once pt arrives. Dr. Mali Sheldon at [facility] ER accepting.  Final Clinical Impression(s) / ED Diagnoses Final diagnoses:  Acute coronary syndrome [facility])  NSTEMI (non-ST elevated myocardial infarction) [facility])    Rx / DC Orders ED Discharge Orders    None       Lucrezia Starch, MD 10/30/19 [phone]

## 2019-10-30 NOTE — ED Triage Notes (Signed)
Pt here from [facility] as transfer. Pt has been having cp for 2 days, woke up this morning and felt better, then pt went to class and cp started again. C/o central pressure cp, non-radiating. Pt has taken 3 SL nitro, 324 ASA, 50 mcg fentanyl. Pt currently on heparin at 900 units/hr, received 4000unit bolus. AOx4, vss, bilateral 20 AC.

## 2019-10-31 ENCOUNTER — Inpatient Hospital Stay (HOSPITAL_COMMUNITY): Payer: Self-pay

## 2019-10-31 DIAGNOSIS — R079 Chest pain, unspecified: Secondary | ICD-10-CM

## 2019-10-31 LAB — COMPREHENSIVE METABOLIC PANEL
ALT: 14 U/L (ref 0–44)
AST: 19 U/L (ref 15–41)
Albumin: 3 g/dL — ABNORMAL LOW (ref 3.5–5.0)
Alkaline Phosphatase: 79 U/L (ref 38–126)
Anion gap: 8 (ref 5–15)
BUN: 9 mg/dL (ref 6–20)
CO2: 26 mmol/L (ref 22–32)
Calcium: 8.7 mg/dL — ABNORMAL LOW (ref 8.9–10.3)
Chloride: 102 mmol/L (ref 98–111)
Creatinine, Ser: 0.77 mg/dL (ref 0.44–1.00)
GFR calc Af Amer: >60 mL/min (ref >60)
GFR calc non Af Amer: >60 mL/min (ref >60)
Glucose, Bld: 159 mg/dL — ABNORMAL HIGH (ref 70–99)
Potassium: 3.6 mmol/L (ref 3.5–5.1)
Sodium: 136 mmol/L (ref 135–145)
Total Bilirubin: 0.5 mg/dL (ref 0.3–1.2)
Total Protein: 7.2 g/dL (ref 6.5–8.1)

## 2019-10-31 LAB — HEPARIN LEVEL (UNFRACTIONATED)
Heparin Unfractionated: 0.19 IU/mL — ABNORMAL LOW (ref 0.30–0.70)
Heparin Unfractionated: 0.32 IU/mL (ref 0.30–0.70)
Heparin Unfractionated: 0.31 IU/mL (ref 0.30–0.70)

## 2019-10-31 LAB — GLUCOSE, CAPILLARY
Glucose-Capillary: 164 mg/dL — ABNORMAL HIGH (ref 70–99)
Glucose-Capillary: 124 mg/dL — ABNORMAL HIGH (ref 70–99)
Glucose-Capillary: 132 mg/dL — ABNORMAL HIGH (ref 70–99)
Glucose-Capillary: 164 mg/dL — ABNORMAL HIGH (ref 70–99)

## 2019-10-31 LAB — TROPONIN I (HIGH SENSITIVITY): Troponin I (High Sensitivity): 106 ng/L (ref <18)

## 2019-10-31 LAB — ECHOCARDIOGRAM COMPLETE
Height: 62 in
Weight: 3731.95 oz

## 2019-10-31 LAB — PREGNANCY, URINE: Preg Test, Ur: NEGATIVE

## 2019-10-31 LAB — MRSA PCR SCREENING: MRSA by PCR: NEGATIVE

## 2019-10-31 MED ORDER — HEPARIN BOLUS VIA INFUSION
1000.0000 [IU] | Freq: Once | INTRAVENOUS | Status: AC
  Administered 2019-10-31: 1000 [IU] via INTRAVENOUS
  Filled 2019-10-31: qty 1000

## 2019-10-31 MED ORDER — CARVEDILOL 6.25 MG PO TABS
6.2500 mg | ORAL_TABLET | Freq: Two times a day (BID) | ORAL | Status: DC
  Administered 2019-10-31 – 2019-11-02 (×5): 6.25 mg via ORAL
  Filled 2019-10-31 (×5): qty 1

## 2019-10-31 NOTE — Progress Notes (Signed)
ANTICOAGULATION CONSULT NOTE - Initial Consult  Pharmacy Consult for heparin Indication: chest pain/ACS  Allergies  Allergen Reactions  . Amlodipine Hives  . Hydrocodone-Acetaminophen Hives and Itching    And itching Itching and hives  . Losartan Potassium-Hctz Other (See Comments)    Dizziness  . Tetanus Toxoids     Arm swelling size of a softball.     Patient Measurements: Height: 5\' 2"  (157.5 cm) Weight: 105.8 kg (233 lb 4 oz) IBW/kg (Calculated) : 50.1 Heparin Dosing Weight: 75kg  Vital Signs: Temp: 98.5 F (36.9 C) (06/05 1028) Temp Source: Oral (06/05 1028) BP: 143/89 (06/05 1139) Pulse Rate: 84 (06/05 1200)  Labs: Recent Labs    10/30/19 1449 10/30/19 1449 10/30/19 1635 10/30/19 2137 10/30/19 2328 10/31/19 0215 10/31/19 1129  HGB 12.4  --   --   --   --   --   --   HCT 38.4  --   --   --   --   --   --   PLT 382  --   --   --   --   --   --   APTT 28  --   --   --   --   --   --   LABPROT 12.4  --   --   --   --   --   --   INR 1.0  --   --   --   --   --   --   HEPARINUNFRC  --   --   --  0.26*  --  0.19* 0.32  CREATININE 0.87  --   --   --   --  0.77  --   TROPONINIHS 200*   < > 148* 122* 106*  --   --    < > = values in this interval not displayed.    Estimated Creatinine Clearance: 100.4 mL/min (by C-G formula based on SCr of 0.77 mg/dL).  Assessment: 44 yo f started on heparin for NSTEMI. No AC PTA. H/H, plt stable.   After last dose adjustment, lvl now lower at 0.19  Per RN, was infusing through [facility] and pump was alarming a lot. Now in new IV site for last 2 hours  No bleeding  Heparin level came back therapeutic. We will check a confirmation level. Cath Monday.   Goal of Therapy:  Heparin level 0.3-0.7 units/ml Monitor platelets by anticoagulation protocol: Yes   Plan:  Continue heparin 1050 units/hr  Check confirmation HL  Daily HL and CBC  Wednesday, PharmD, BCIDP, AAHIVP, CPP Infectious Disease Pharmacist 10/31/2019 2:08  PM

## 2019-10-31 NOTE — Progress Notes (Signed)
Progress Note  Patient Name: Amy [name] Date of Encounter: 10/31/2019  [facility] Cardiologist: Chrystie Nose, MD   Subjective   Feeling better.  No chest pain no shortness of breath currently.  On IV heparin.  Inpatient Medications    Scheduled Meds: . aspirin EC  81 mg Oral Daily  . atorvastatin  80 mg Oral Daily  . carvedilol  3.125 mg Oral BID WC  . hydrochlorothiazide  12.5 mg Oral Daily  . insulin aspart  0-15 Units Subcutaneous TID WC  . irbesartan  75 mg Oral Daily  . loratadine  10 mg Oral Daily  . sodium chloride flush  3 mL Intravenous Q12H   Continuous Infusions: . sodium chloride    . sodium chloride 1 mL/kg/hr (10/31/19 0523)  . heparin 1,050 Units/hr (10/31/19 0827)   PRN Meds: sodium chloride, acetaminophen, nitroGLYCERIN, ondansetron (ZOFRAN) IV, sodium chloride flush   Vital Signs    Vitals:   10/30/19 2245 10/30/19 2309 10/31/19 0342 10/31/19 0710  BP: (!) 160/84 (!) 176/127 138/84 (!) 150/89  Pulse: 86 93 81 77  Resp: (!) 21 20 15  (!) 24  Temp: 98.5 F (36.9 C) 98.2 F (36.8 C) 98.2 F (36.8 C) 98.1 F (36.7 C)  TempSrc: Oral Oral Oral Oral  SpO2: 97% 99% 97% 97%  Weight:  105.8 kg    Height:  5\' 2"  (1.575 m)      Intake/Output Summary (Last 24 hours) at 10/31/2019 0936 Last data filed at 10/31/2019 0827 Gross per 24 hour  Intake 240 ml  Output --  Net 240 ml   Last 3 Weights 10/30/2019 10/30/2019 10/16/2019  Weight (lbs) 233 lb 4 oz 232 lb 3.2 oz 232 lb  Weight (kg) 105.8 kg 105.325 kg 105.235 kg      Telemetry    No adverse arrhythmias- Personally Reviewed  ECG    Sinus rhythm deep T wave inversions noted on ECG.- Personally Reviewed  Physical Exam   GEN: No acute distress.   Neck: No JVD Cardiac: RRR, no murmurs, rubs, or gallops.  Respiratory: Clear to auscultation bilaterally. GI: Soft, nontender, non-distended  MS: No edema; No deformity. Neuro:  Nonfocal  Psych: Normal affect   Labs    High  Sensitivity Troponin:   Recent Labs  Lab 10/30/19 1449 10/30/19 1635 10/30/19 2137 10/30/19 2328  TROPONINIHS 200* 148* 122* 106*      Chemistry Recent Labs  Lab 10/30/19 1449 10/31/19 0215  NA 136 136  K 3.3* 3.6  CL 100 102  CO2 26 26  GLUCOSE 148* 159*  BUN 9 9  CREATININE 0.87 0.77  CALCIUM 8.9 8.7*  PROT  --  7.2  ALBUMIN  --  3.0*  AST  --  19  ALT  --  14  ALKPHOS  --  79  BILITOT  --  0.5  GFRNONAA >60 >60  GFRAA >60 >60  ANIONGAP 10 8     Hematology Recent Labs  Lab 10/30/19 1449  WBC 7.9  RBC 4.41  HGB 12.4  HCT 38.4  MCV 87.1  MCH 28.1  MCHC 32.3  RDW 13.3  PLT 382    BNPNo results for input(s): BNP, PROBNP in the last 168 hours.   DDimer No results for input(s): DDIMER in the last 168 hours.   Radiology    DG Chest Portable 1 View  Result Date: 10/30/2019 CLINICAL DATA:  Chest pain. Additional history provided: Chest tenderness, pain with breathing, sneezing and coughing yesterday  EXAM: PORTABLE CHEST 1 VIEW COMPARISON:  Chest radiograph 07/30/2019 FINDINGS: Heart size at the upper limits of normal, unchanged. Minimal opacity within the medial right lung base has an appearance most suggestive of atelectasis. No definite airspace consolidation. No frank pulmonary edema. No evidence of pleural effusion or pneumothorax. No acute bony abnormality identified. IMPRESSION: Minimal ill-defined opacity within the medial right lung base has an appearance most suggestive of atelectasis. No definite airspace consolidation. Heart size at the upper limits of normal, unchanged. Electronically Signed   By: Kellie Simmering DO   On: 10/30/2019 15:41    Cardiac Studies   ECHO p  Patient Profile     44 y.o. female with diabetes morbid obesity hypertension chest pain, pressure with deep T wave inversions in the anteroseptal and inferior leads.  Troponin was 200 then 148.  Non-ST elevation myocardial infarction -IV heparin -Cardiac catheterization Monday -High  potency statin -Echo pending  Diabetes with hypertension -On valsartan HCTZ, new start beta-blocker in the setting of non-ST elevation myocardial infarction.  We will increase carvedilol to 6.25 twice a day  Hyperlipidemia -High potency statin given myocardial infarction.  I will check lipid panel tomorrow morning  For questions or updates, please contact [place] Please consult [email or web address] for contact info under        Signed, Candee Furbish, MD  10/31/2019, 9:36 AM

## 2019-10-31 NOTE — Progress Notes (Addendum)
ANTICOAGULATION CONSULT NOTE - Initial Consult  Pharmacy Consult for heparin Indication: chest pain/ACS  Allergies  Allergen Reactions  . Amlodipine Hives  . Hydrocodone-Acetaminophen Hives and Itching    And itching Itching and hives  . Losartan Potassium-Hctz Other (See Comments)    Dizziness  . Tetanus Toxoids     Arm swelling size of a softball.     Patient Measurements: Height: 5\' 2"  (157.5 cm) Weight: 105.8 kg (233 lb 4 oz) IBW/kg (Calculated) : 50.1 Heparin Dosing Weight: 75kg  Vital Signs: Temp: 98.2 F (36.8 C) (06/04 2309) Temp Source: Oral (06/04 2309) BP: 176/127 (06/04 2309) Pulse Rate: 93 (06/04 2309)  Labs: Recent Labs    10/30/19 1449 10/30/19 1449 10/30/19 1635 10/30/19 2137 10/30/19 2328 10/31/19 0215  HGB 12.4  --   --   --   --   --   HCT 38.4  --   --   --   --   --   PLT 382  --   --   --   --   --   APTT 28  --   --   --   --   --   LABPROT 12.4  --   --   --   --   --   INR 1.0  --   --   --   --   --   HEPARINUNFRC  --   --   --  0.26*  --  0.19*  CREATININE 0.87  --   --   --   --   --   TROPONINIHS 200*   < > 148* 122* 106*  --    < > = values in this interval not displayed.    Estimated Creatinine Clearance: 92.3 mL/min (by C-G formula based on SCr of 0.87 mg/dL).  Assessment: 44 yo f started on heparin for NSTEMI. No AC PTA. H/H, plt stable.   After last dose adjustment, lvl now lower at 0.19  Per RN, was infusing through [facility] and pump was alarming a lot. Now in new IV site for last 2 hours  No bleeding  Goal of Therapy:  Heparin level 0.3-0.7 units/ml Monitor platelets by anticoagulation protocol: Yes   Plan:  Bolus 1000 units x 1 Continue current rate of 1050 units/hr - since lvl may only be low d/t pump alarms Recheck at 0930  [facility], PharmD, BCPS, BCCCP Clinical Pharmacist [phone]  Please check AMION for all [facility] Pharmacy numbers  10/31/2019 3:15 AM

## 2019-10-31 NOTE — Plan of Care (Signed)

## 2019-10-31 NOTE — Progress Notes (Signed)
°  Echocardiogram 2D Echocardiogram has been performed.  Pieter Partridge 10/31/2019, 10:55 AM

## 2019-10-31 NOTE — Progress Notes (Signed)
ANTICOAGULATION CONSULT NOTE  Pharmacy Consult for heparin Indication: chest pain/ACS  Allergies  Allergen Reactions  . Amlodipine Hives  . Hydrocodone-Acetaminophen Hives and Itching    And itching Itching and hives  . Losartan Potassium-Hctz Other (See Comments)    Dizziness  . Tetanus Toxoids     Arm swelling size of a softball.     Patient Measurements: Height: 5\' 2"  (157.5 cm) Weight: 105.8 kg (233 lb 4 oz) IBW/kg (Calculated) : 50.1 Heparin Dosing Weight: 75kg  Vital Signs: Temp: 98.2 F (36.8 C) (06/05 1519) Temp Source: Oral (06/05 1519) BP: 135/87 (06/05 1519) Pulse Rate: 77 (06/05 1519)  Labs: Recent Labs    10/30/19 1449 10/30/19 1449 10/30/19 1635 10/30/19 2137 10/30/19 2137 10/30/19 2328 10/31/19 0215 10/31/19 1129 10/31/19 1730  HGB 12.4  --   --   --   --   --   --   --   --   HCT 38.4  --   --   --   --   --   --   --   --   PLT 382  --   --   --   --   --   --   --   --   APTT 28  --   --   --   --   --   --   --   --   LABPROT 12.4  --   --   --   --   --   --   --   --   INR 1.0  --   --   --   --   --   --   --   --   HEPARINUNFRC  --   --   --  0.26*   < >  --  0.19* 0.32 0.31  CREATININE 0.87  --   --   --   --   --  0.77  --   --   TROPONINIHS 200*   < > 148* 122*  --  106*  --   --   --    < > = values in this interval not displayed.    Estimated Creatinine Clearance: 100.4 mL/min (by C-G formula based on SCr of 0.77 mg/dL).  Assessment: 44 yo f started on heparin for NSTEMI. No AC PTA. H/H, plt stable. Cath Monday.  -heparin level at goal  Goal of Therapy:  Heparin level 0.3-0.7 units/ml Monitor platelets by anticoagulation protocol: Yes   Plan:  Continue heparin 1050 units/hr  Daily HL and CBC  Thursday, PharmD Clinical Pharmacist **Pharmacist phone directory can now be found on [email or web address] ([email or web address]).  Listed under [facility].

## 2019-11-01 LAB — HEPARIN LEVEL (UNFRACTIONATED): Heparin Unfractionated: 0.35 IU/mL (ref 0.30–0.70)

## 2019-11-01 LAB — LIPID PANEL
Cholesterol: 139 mg/dL (ref 0–200)
HDL: 34 mg/dL — ABNORMAL LOW (ref >40)
LDL Cholesterol: 82 mg/dL (ref 0–99)
Total CHOL/HDL Ratio: 4.1 RATIO
Triglycerides: 117 mg/dL (ref <150)
VLDL: 23 mg/dL (ref 0–40)

## 2019-11-01 LAB — GLUCOSE, CAPILLARY
Glucose-Capillary: 135 mg/dL — ABNORMAL HIGH (ref 70–99)
Glucose-Capillary: 118 mg/dL — ABNORMAL HIGH (ref 70–99)
Glucose-Capillary: 128 mg/dL — ABNORMAL HIGH (ref 70–99)
Glucose-Capillary: 180 mg/dL — ABNORMAL HIGH (ref 70–99)

## 2019-11-01 MED ORDER — SODIUM CHLORIDE 0.9 % WEIGHT BASED INFUSION
1.0000 mL/kg/h | INTRAVENOUS | Status: DC
  Administered 2019-11-02: 1 mL/kg/h via INTRAVENOUS

## 2019-11-01 NOTE — Plan of Care (Signed)

## 2019-11-01 NOTE — Progress Notes (Signed)
Progress Note  Patient Name: Amy [name] Date of Encounter: 11/01/2019  [facility] Cardiologist: Pixie Casino, MD   Subjective   Feels well, no chest pain currently.  No shortness of breath.  Inpatient Medications    Scheduled Meds:  aspirin EC  81 mg Oral Daily   atorvastatin  80 mg Oral Daily   carvedilol  6.25 mg Oral BID WC   hydrochlorothiazide  12.5 mg Oral Daily   insulin aspart  0-15 Units Subcutaneous TID WC   irbesartan  75 mg Oral Daily   loratadine  10 mg Oral Daily   sodium chloride flush  3 mL Intravenous Q12H   Continuous Infusions:  sodium chloride     sodium chloride 1 mL/kg/hr (10/31/19 0523)   heparin 1,050 Units/hr (11/01/19 0500)   PRN Meds: sodium chloride, acetaminophen, nitroGLYCERIN, ondansetron (ZOFRAN) IV, sodium chloride flush   Vital Signs    Vitals:   10/31/19 2000 10/31/19 2300 11/01/19 0500 11/01/19 0714  BP:  123/83 133/81 133/78  Pulse:  81 80 77  Resp: 17 20 (!) 21 15  Temp:  98 F (36.7 C) 98.1 F (36.7 C) 98.1 F (36.7 C)  TempSrc:  Oral Oral Oral  SpO2:  92% 90% 97%  Weight:      Height:        Intake/Output Summary (Last 24 hours) at 11/01/2019 0850 Last data filed at 11/01/2019 0500 Gross per 24 hour  Intake 2030.05 ml  Output 600 ml  Net 1430.05 ml   Last 3 Weights 10/30/2019 10/30/2019 10/16/2019  Weight (lbs) 233 lb 4 oz 232 lb 3.2 oz 232 lb  Weight (kg) 105.8 kg 105.325 kg 105.235 kg      Telemetry    No adverse arrhythmias- Personally Reviewed  ECG    No new- Personally Reviewed  Physical Exam   GEN: No acute distress.   Neck: No JVD Cardiac: RRR, no murmurs, rubs, or gallops.  Respiratory: Clear to auscultation bilaterally. GI: Soft, nontender, non-distended  MS: No edema; No deformity. Neuro:  Nonfocal  Psych: Normal affect   Labs    High Sensitivity Troponin:   Recent Labs  Lab 10/30/19 1449 10/30/19 1635 10/30/19 2137 10/30/19 2328  TROPONINIHS 200* 148* 122*  106*      Chemistry Recent Labs  Lab 10/30/19 1449 10/31/19 0215  NA 136 136  K 3.3* 3.6  CL 100 102  CO2 26 26  GLUCOSE 148* 159*  BUN 9 9  CREATININE 0.87 0.77  CALCIUM 8.9 8.7*  PROT  --  7.2  ALBUMIN  --  3.0*  AST  --  19  ALT  --  14  ALKPHOS  --  79  BILITOT  --  0.5  GFRNONAA >60 >60  GFRAA >60 >60  ANIONGAP 10 8     Hematology Recent Labs  Lab 10/30/19 1449  WBC 7.9  RBC 4.41  HGB 12.4  HCT 38.4  MCV 87.1  MCH 28.1  MCHC 32.3  RDW 13.3  PLT 382    BNPNo results for input(s): BNP, PROBNP in the last 168 hours.   DDimer No results for input(s): DDIMER in the last 168 hours.   Radiology    DG Chest Portable 1 View  Result Date: 10/30/2019 CLINICAL DATA:  Chest pain. Additional history provided: Chest tenderness, pain with breathing, sneezing and coughing yesterday EXAM: PORTABLE CHEST 1 VIEW COMPARISON:  Chest radiograph 07/30/2019 FINDINGS: Heart size at the upper limits of normal, unchanged. Minimal  opacity within the medial right lung base has an appearance most suggestive of atelectasis. No definite airspace consolidation. No frank pulmonary edema. No evidence of pleural effusion or pneumothorax. No acute bony abnormality identified. IMPRESSION: Minimal ill-defined opacity within the medial right lung base has an appearance most suggestive of atelectasis. No definite airspace consolidation. Heart size at the upper limits of normal, unchanged. Electronically Signed   By: Jackey Loge DO   On: 10/30/2019 15:41   ECHOCARDIOGRAM COMPLETE  Result Date: 10/31/2019    ECHOCARDIOGRAM REPORT   Patient Name:   [name] Date of Exam: 10/31/2019 Medical Rec #:  [id number]             Height:       62.0 in Accession #:    [id number]            Weight:       233.2 lb Date of Birth:  1975-10-12              BSA:          2.041 m Patient Age:    44 years              BP:           156/102 mmHg Patient Gender: F                     HR:           90 bpm. Exam  Location:  Inpatient Procedure: 2D Echo, Cardiac Doppler and Color Doppler Indications:    Chest pain  History:        Patient has no prior history of Echocardiogram examinations.                 Signs/Symptoms:Chest Pain; Risk Factors:Diabetes, Morbid obesity                 and Hypertension.  Sonographer:    Lavenia Atlas Referring Phys: [id number] CHRISTOPHER RONALD BERGE  Sonographer Comments: Patient is morbidly obese. Image acquisition challenging due to patient body habitus. IMPRESSIONS  1. Left ventricular ejection fraction, by estimation, is 65 to 70%. The left ventricle has normal function. The left ventricle has no regional wall motion abnormalities. Left ventricular diastolic parameters are consistent with Grade I diastolic dysfunction (impaired relaxation).  2. Right ventricular systolic function is normal. The right ventricular size is normal. There is normal pulmonary artery systolic pressure.  3. The mitral valve is normal in structure. No evidence of mitral valve regurgitation. No evidence of mitral stenosis.  4. Tricuspid valve regurgitation is moderate.  5. The aortic valve is normal in structure. Aortic valve regurgitation is not visualized. No aortic stenosis is present.  6. The inferior vena cava is normal in size with greater than 50% respiratory variability, suggesting right atrial pressure of 3 mmHg. FINDINGS  Left Ventricle: Left ventricular ejection fraction, by estimation, is 65 to 70%. The left ventricle has normal function. The left ventricle has no regional wall motion abnormalities. The left ventricular internal cavity size was normal in size. There is  no left ventricular hypertrophy. Left ventricular diastolic parameters are consistent with Grade I diastolic dysfunction (impaired relaxation). Right Ventricle: The right ventricular size is normal. No increase in right ventricular wall thickness. Right ventricular systolic function is normal. There is normal pulmonary artery systolic  pressure. The tricuspid regurgitant velocity is 2.74 m/s, and  with an assumed right atrial pressure of 3 mmHg, the estimated right  ventricular systolic pressure is 33.0 mmHg. Left Atrium: Left atrial size was normal in size. Right Atrium: Right atrial size was normal in size. Pericardium: There is no evidence of pericardial effusion. Mitral Valve: The mitral valve is normal in structure. Normal mobility of the mitral valve leaflets. No evidence of mitral valve regurgitation. No evidence of mitral valve stenosis. Tricuspid Valve: The tricuspid valve is normal in structure. Tricuspid valve regurgitation is moderate . No evidence of tricuspid stenosis. Aortic Valve: The aortic valve is normal in structure. Aortic valve regurgitation is not visualized. No aortic stenosis is present. Pulmonic Valve: The pulmonic valve was normal in structure. Pulmonic valve regurgitation is not visualized. No evidence of pulmonic stenosis. Aorta: The aortic root is normal in size and structure. Venous: The inferior vena cava is normal in size with greater than 50% respiratory variability, suggesting right atrial pressure of 3 mmHg. IAS/Shunts: No atrial level shunt detected by color flow Doppler.  LEFT VENTRICLE PLAX 2D LVIDd:         3.90 cm  Diastology LVIDs:         2.80 cm  LV e' lateral:   5.00 cm/s LV PW:         1.40 cm  LV E/e' lateral: 13.0 LV IVS:        1.40 cm  LV e' medial:    4.57 cm/s LVOT diam:     2.10 cm  LV E/e' medial:  14.2 LV SV:         69 LV SV Index:   34 LVOT Area:     3.46 cm  RIGHT VENTRICLE RV Basal diam:  2.90 cm RV S prime:     5.11 cm/s TAPSE (M-mode): 2.6 cm LEFT ATRIUM             Index       RIGHT ATRIUM           Index LA diam:        3.50 cm 1.72 cm/m  RA Area:     14.70 cm LA Vol (A2C):   45.9 ml 22.49 ml/m RA Volume:   36.70 ml  17.98 ml/m LA Vol (A4C):   46.3 ml 22.69 ml/m LA Biplane Vol: 46.4 ml 22.74 ml/m  AORTIC VALVE LVOT Vmax:   91.70 cm/s LVOT Vmean:  64.300 cm/s LVOT VTI:    0.198 m   AORTA Ao Root diam: 2.80 cm MITRAL VALVE               TRICUSPID VALVE MV Area (PHT): 3.85 cm    TR Peak grad:   30.0 mmHg MV Decel Time: 197 msec    TR Vmax:        274.00 cm/s MV E velocity: 65.10 cm/s MV A velocity: 63.40 cm/s  SHUNTS MV E/A ratio:  1.03        Systemic VTI:  0.20 m                            Systemic Diam: 2.10 cm Donato Schultz MD Electronically signed by Donato Schultz MD Signature Date/Time: 10/31/2019/1:58:09 PM    Final     Cardiac Studies    1. Left ventricular ejection fraction, by estimation, is 65 to 70%. The  left ventricle has normal function. The left ventricle has no regional  wall motion abnormalities. Left ventricular diastolic parameters are  consistent with Grade I diastolic  dysfunction (impaired relaxation).  2. Right  ventricular systolic function is normal. The right ventricular  size is normal. There is normal pulmonary artery systolic pressure.  3. The mitral valve is normal in structure. No evidence of mitral valve  regurgitation. No evidence of mitral stenosis.  4. Tricuspid valve regurgitation is moderate.  5. The aortic valve is normal in structure. Aortic valve regurgitation is  not visualized. No aortic stenosis is present.  6. The inferior vena cava is normal in size with greater than 50%  respiratory variability, suggesting right atrial pressure of 3 mmHg.   Patient Profile     44 y.o. female with diabetes morbid obesity hypertension chest pain, pressure with deep T wave inversions in the anteroseptal and inferior leads.  Troponin was 200 then 148.  Non-ST elevation myocardial infarction -IV heparin -Cardiac catheterization Monday -High potency statin -Echo reassuring EF.  Diabetes with hypertension -On valsartan HCTZ, new start beta-blocker in the setting of non-ST elevation myocardial infarction.  -BP improved after increasing carvedilol to 6.25 twice a day yesterday.  Hyperlipidemia -Continue with high potency statin given  myocardial infarction.  LDL 82 currently.  Morbid obesity -Continue to encourage weight loss.  Cardiac rehab.  For questions or updates, please contact [facility] Please consult [email or web address] for contact info under        Signed, Donato Schultz, MD  11/01/2019, 8:50 AM

## 2019-11-01 NOTE — Progress Notes (Signed)
ANTICOAGULATION CONSULT NOTE  Pharmacy Consult for heparin Indication: chest pain/ACS  Allergies  Allergen Reactions  . Amlodipine Hives  . Hydrocodone-Acetaminophen Hives and Itching    And itching Itching and hives  . Losartan Potassium-Hctz Other (See Comments)    Dizziness  . Tetanus Toxoids     Arm swelling size of a softball.     Patient Measurements: Height: 5\' 2"  (157.5 cm) Weight: 105.8 kg (233 lb 4 oz) IBW/kg (Calculated) : 50.1 Heparin Dosing Weight: 75kg  Vital Signs: Temp: 98.1 F (36.7 C) (06/06 1123) Temp Source: Oral (06/06 1123) BP: 133/84 (06/06 1123) Pulse Rate: 77 (06/06 1123)  Labs: Recent Labs    10/30/19 1449 10/30/19 1449 10/30/19 1635 10/30/19 2137 10/30/19 2137 10/30/19 2328 10/31/19 0215 10/31/19 0215 10/31/19 1129 10/31/19 1730 11/01/19 0726  HGB 12.4  --   --   --   --   --   --   --   --   --   --   HCT 38.4  --   --   --   --   --   --   --   --   --   --   PLT 382  --   --   --   --   --   --   --   --   --   --   APTT 28  --   --   --   --   --   --   --   --   --   --   LABPROT 12.4  --   --   --   --   --   --   --   --   --   --   INR 1.0  --   --   --   --   --   --   --   --   --   --   HEPARINUNFRC  --   --   --  0.26*   < >  --  0.19*   < > 0.32 0.31 0.35  CREATININE 0.87  --   --   --   --   --  0.77  --   --   --   --   TROPONINIHS 200*   < > 148* 122*  --  106*  --   --   --   --   --    < > = values in this interval not displayed.    Estimated Creatinine Clearance: 100.4 mL/min (by C-G formula based on SCr of 0.77 mg/dL).  Assessment: 44 yo f started on heparin for NSTEMI. No AC PTA. H/H, plt stable. Cath Monday.   Heparin level continue to be therapeutic. Cont same rate.   Goal of Therapy:  Heparin level 0.3-0.7 units/ml Monitor platelets by anticoagulation protocol: Yes   Plan:  Continue heparin 1050 units/hr  Daily HL and CBC  Sunday, PharmD, BCIDP, AAHIVP, CPP Infectious Disease  Pharmacist 11/01/2019 1:58 PM

## 2019-11-02 ENCOUNTER — Other Ambulatory Visit (HOSPITAL_COMMUNITY): Payer: Self-pay | Admitting: Physician Assistant

## 2019-11-02 ENCOUNTER — Telehealth: Payer: Self-pay | Admitting: Internal Medicine

## 2019-11-02 ENCOUNTER — Encounter (HOSPITAL_COMMUNITY)
Admission: EM | Disposition: A | Discharge status: Home / Self Care | Payer: Self-pay | Source: Home / Self Care | Attending: Internal Medicine

## 2019-11-02 DIAGNOSIS — I249 Acute ischemic heart disease, unspecified: Secondary | ICD-10-CM

## 2019-11-02 DIAGNOSIS — I2510 Atherosclerotic heart disease of native coronary artery without angina pectoris: Secondary | ICD-10-CM

## 2019-11-02 DIAGNOSIS — E785 Hyperlipidemia, unspecified: Secondary | ICD-10-CM

## 2019-11-02 DIAGNOSIS — I10 Essential (primary) hypertension: Secondary | ICD-10-CM

## 2019-11-02 HISTORY — PX: LEFT HEART CATH AND CORONARY ANGIOGRAPHY: CATH118249

## 2019-11-02 LAB — BASIC METABOLIC PANEL
Anion gap: 10 (ref 5–15)
BUN: 12 mg/dL (ref 6–20)
CO2: 26 mmol/L (ref 22–32)
Calcium: 8.7 mg/dL — ABNORMAL LOW (ref 8.9–10.3)
Chloride: 100 mmol/L (ref 98–111)
Creatinine, Ser: 0.87 mg/dL (ref 0.44–1.00)
GFR calc Af Amer: >60 mL/min (ref >60)
GFR calc non Af Amer: >60 mL/min (ref >60)
Glucose, Bld: 165 mg/dL — ABNORMAL HIGH (ref 70–99)
Potassium: 3.7 mmol/L (ref 3.5–5.1)
Sodium: 136 mmol/L (ref 135–145)

## 2019-11-02 LAB — CBC
HCT: 34.8 % — ABNORMAL LOW (ref 36.0–46.0)
Hemoglobin: 11.4 g/dL — ABNORMAL LOW (ref 12.0–15.0)
MCH: 28.6 pg (ref 26.0–34.0)
MCHC: 32.8 g/dL (ref 30.0–36.0)
MCV: 87.4 fL (ref 80.0–100.0)
Platelets: 316 10*3/uL (ref 150–400)
RBC: 3.98 MIL/uL (ref 3.87–5.11)
RDW: 13.2 % (ref 11.5–15.5)
WBC: 6.2 10*3/uL (ref 4.0–10.5)
nRBC: 0 % (ref 0.0–0.2)

## 2019-11-02 LAB — GLUCOSE, CAPILLARY
Glucose-Capillary: 167 mg/dL — ABNORMAL HIGH (ref 70–99)
Glucose-Capillary: 107 mg/dL — ABNORMAL HIGH (ref 70–99)
Glucose-Capillary: 198 mg/dL — ABNORMAL HIGH (ref 70–99)

## 2019-11-02 LAB — HEPARIN LEVEL (UNFRACTIONATED): Heparin Unfractionated: 0.27 IU/mL — ABNORMAL LOW (ref 0.30–0.70)

## 2019-11-02 SURGERY — LEFT HEART CATH AND CORONARY ANGIOGRAPHY
Anesthesia: LOCAL

## 2019-11-02 MED ORDER — LABETALOL HCL 5 MG/ML IV SOLN: 10.0000 mg | INTRAVENOUS | Status: DC | PRN

## 2019-11-02 MED ORDER — VERAPAMIL HCL 2.5 MG/ML IV SOLN
INTRAVENOUS | Status: DC | PRN
  Administered 2019-11-02: 10 mL via INTRA_ARTERIAL

## 2019-11-02 MED ORDER — ASPIRIN 81 MG PO TBEC: 81.0000 mg | DELAYED_RELEASE_TABLET | Freq: Every day | ORAL | 3 refills | Status: DC

## 2019-11-02 MED ORDER — VERAPAMIL HCL 2.5 MG/ML IV SOLN
INTRAVENOUS | Status: AC
  Filled 2019-11-02: qty 2

## 2019-11-02 MED ORDER — FENTANYL CITRATE (PF) 100 MCG/2ML IJ SOLN
INTRAMUSCULAR | Status: AC
  Filled 2019-11-02: qty 2

## 2019-11-02 MED ORDER — MIDAZOLAM HCL 2 MG/2ML IJ SOLN
INTRAMUSCULAR | Status: DC | PRN
  Administered 2019-11-02 (×2): 1 mg via INTRAVENOUS

## 2019-11-02 MED ORDER — FENTANYL CITRATE (PF) 100 MCG/2ML IJ SOLN
INTRAMUSCULAR | Status: DC | PRN
  Administered 2019-11-02 (×2): 25 ug via INTRAVENOUS

## 2019-11-02 MED ORDER — HYDRALAZINE HCL 20 MG/ML IJ SOLN: 10.0000 mg | INTRAMUSCULAR | Status: DC | PRN

## 2019-11-02 MED ORDER — LIDOCAINE HCL (PF) 1 % IJ SOLN
INTRAMUSCULAR | Status: DC | PRN
  Administered 2019-11-02: 2 mL

## 2019-11-02 MED ORDER — HEPARIN SODIUM (PORCINE) 1000 UNIT/ML IJ SOLN
INTRAMUSCULAR | Status: DC | PRN
  Administered 2019-11-02: 5000 [IU] via INTRAVENOUS

## 2019-11-02 MED ORDER — ATORVASTATIN CALCIUM 80 MG PO TABS: 80.0000 mg | ORAL_TABLET | Freq: Every day | ORAL | 6 refills | Status: DC

## 2019-11-02 MED ORDER — HEPARIN (PORCINE) IN NACL 1000-0.9 UT/500ML-% IV SOLN
INTRAVENOUS | Status: DC | PRN
  Administered 2019-11-02 (×2): 500 mL

## 2019-11-02 MED ORDER — IOHEXOL 350 MG/ML SOLN
INTRAVENOUS | Status: DC | PRN
  Administered 2019-11-02: 50 mL

## 2019-11-02 MED ORDER — HEPARIN SODIUM (PORCINE) 1000 UNIT/ML IJ SOLN
INTRAMUSCULAR | Status: AC
  Filled 2019-11-02: qty 1

## 2019-11-02 MED ORDER — LIDOCAINE HCL (PF) 1 % IJ SOLN
INTRAMUSCULAR | Status: AC
  Filled 2019-11-02: qty 30

## 2019-11-02 MED ORDER — SODIUM CHLORIDE 0.9% FLUSH: 3.0000 mL | INTRAVENOUS | Status: DC | PRN

## 2019-11-02 MED ORDER — SODIUM CHLORIDE 0.9 % IV SOLN: INTRAVENOUS | Status: DC

## 2019-11-02 MED ORDER — CARVEDILOL 6.25 MG PO TABS: 6.2500 mg | ORAL_TABLET | Freq: Two times a day (BID) | ORAL | 6 refills | Status: DC

## 2019-11-02 MED ORDER — HEPARIN (PORCINE) IN NACL 1000-0.9 UT/500ML-% IV SOLN
INTRAVENOUS | Status: AC
  Filled 2019-11-02: qty 1000

## 2019-11-02 MED ORDER — NITROGLYCERIN 0.4 MG SL SUBL: 0.4000 mg | SUBLINGUAL_TABLET | SUBLINGUAL | 12 refills | Status: DC | PRN

## 2019-11-02 MED ORDER — SODIUM CHLORIDE 0.9 % IV SOLN: 250.0000 mL | INTRAVENOUS | Status: DC | PRN

## 2019-11-02 MED ORDER — MIDAZOLAM HCL 2 MG/2ML IJ SOLN
INTRAMUSCULAR | Status: AC
  Filled 2019-11-02: qty 2

## 2019-11-02 MED ORDER — SODIUM CHLORIDE 0.9% FLUSH: 3.0000 mL | Freq: Two times a day (BID) | INTRAVENOUS | Status: DC

## 2019-11-02 SURGICAL SUPPLY — 9 items

## 2019-11-02 NOTE — Progress Notes (Signed)
ANTICOAGULATION CONSULT NOTE  Pharmacy Consult for heparin Indication: chest pain/ACS  Allergies  Allergen Reactions  . Amlodipine Hives  . Hydrocodone-Acetaminophen Hives and Itching    And itching Itching and hives  . Losartan Potassium-Hctz Other (See Comments)    Dizziness  . Tetanus Toxoids     Arm swelling size of a softball.     Patient Measurements: Height: 5\' 2"  (157.5 cm) Weight: 105.8 kg (233 lb 4 oz) IBW/kg (Calculated) : 50.1 Heparin Dosing Weight: 75kg  Vital Signs: Temp: 97.9 F (36.6 C) (06/07 1103) Temp Source: Oral (06/07 1103) BP: 159/84 (06/07 1103) Pulse Rate: 66 (06/07 1103)  Labs: Recent Labs    10/30/19 1449 10/30/19 1449 10/30/19 1635 10/30/19 2137 10/30/19 2137 10/30/19 2328 10/31/19 0215 10/31/19 1129 10/31/19 1730 11/01/19 0726 11/02/19 0651  HGB 12.4  --   --   --   --   --   --   --   --   --  11.4*  HCT 38.4  --   --   --   --   --   --   --   --   --  34.8*  PLT 382  --   --   --   --   --   --   --   --   --  316  APTT 28  --   --   --   --   --   --   --   --   --   --   LABPROT 12.4  --   --   --   --   --   --   --   --   --   --   INR 1.0  --   --   --   --   --   --   --   --   --   --   HEPARINUNFRC  --   --   --  0.26*   < >  --  0.19*   < > 0.31 0.35 0.27*  CREATININE 0.87  --   --   --   --   --  0.77  --   --   --  0.87  TROPONINIHS 200*   < > 148* 122*  --  106*  --   --   --   --   --    < > = values in this interval not displayed.    Estimated Creatinine Clearance: 92.3 mL/min (by C-G formula based on SCr of 0.87 mg/dL).  Assessment: 44 yo f started on heparin for NSTEMI. No AC PTA. H/H, plt stable. Cath Monday.   Heparin level just below goal   Goal of Therapy:  Heparin level 0.3-0.7 units/ml Monitor platelets by anticoagulation protocol: Yes   Plan:  Increase heparin 1100 units/hr  Follow up after cath  Wednesday PharmD., BCPS Clinical Pharmacist 11/02/2019 11:15 AM

## 2019-11-02 NOTE — Progress Notes (Signed)
Progress Note  Patient Name: Amy [name] Date of Encounter: 11/02/2019  [facility] Cardiologist: Chrystie Nose, MD   Subjective   Pt denies CP   Breathing is OK    Inpatient Medications    Scheduled Meds: . aspirin EC  81 mg Oral Daily  . atorvastatin  80 mg Oral Daily  . carvedilol  6.25 mg Oral BID WC  . hydrochlorothiazide  12.5 mg Oral Daily  . insulin aspart  0-15 Units Subcutaneous TID WC  . irbesartan  75 mg Oral Daily  . loratadine  10 mg Oral Daily  . sodium chloride flush  3 mL Intravenous Q12H   Continuous Infusions: . sodium chloride    . sodium chloride 1 mL/kg/hr (11/02/19 0418)  . heparin 1,050 Units/hr (11/02/19 0418)   PRN Meds: sodium chloride, acetaminophen, nitroGLYCERIN, ondansetron (ZOFRAN) IV, sodium chloride flush   Vital Signs    Vitals:   11/01/19 1856 11/01/19 2303 11/02/19 0400 11/02/19 0707  BP: 136/80 (!) 155/82 136/82 (!) 142/86  Pulse: 79 79  81  Resp: 14 14 (!) 0 19  Temp: 98.1 F (36.7 C) 98.3 F (36.8 C) 98.2 F (36.8 C) 98.1 F (36.7 C)  TempSrc: Oral Oral Oral Oral  SpO2: 97% 99%  97%  Weight:      Height:        Intake/Output Summary (Last 24 hours) at 11/02/2019 1005 Last data filed at 11/02/2019 0418 Gross per 24 hour  Intake 724.6 ml  Output 600 ml  Net 124.6 ml   Last 3 Weights 10/30/2019 10/30/2019 10/16/2019  Weight (lbs) 233 lb 4 oz 232 lb 3.2 oz 232 lb  Weight (kg) 105.8 kg 105.325 kg 105.235 kg      Telemetry    SR  - Personally Reviewed  ECG    No new- Personally Reviewed  Physical Exam   GEN: morbidly obese 44 yo in NAD     Neck: No JVD Cardiac: RRR, no murmurs Respiratory: Clear to auscultation bilaterally. GI: Soft, nontender, non-distended  MS: No LE  edema;  Neuro:  Nonfocal  Psych: Normal affect   Labs    High Sensitivity Troponin:   Recent Labs  Lab 10/30/19 1449 10/30/19 1635 10/30/19 2137 10/30/19 2328  TROPONINIHS 200* 148* 122* 106*      Chemistry Recent  Labs  Lab 10/30/19 1449 10/31/19 0215  NA 136 136  K 3.3* 3.6  CL 100 102  CO2 26 26  GLUCOSE 148* 159*  BUN 9 9  CREATININE 0.87 0.77  CALCIUM 8.9 8.7*  PROT  --  7.2  ALBUMIN  --  3.0*  AST  --  19  ALT  --  14  ALKPHOS  --  79  BILITOT  --  0.5  GFRNONAA >60 >60  GFRAA >60 >60  ANIONGAP 10 8     Hematology Recent Labs  Lab 10/30/19 1449 11/02/19 0651  WBC 7.9 6.2  RBC 4.41 3.98  HGB 12.4 11.4*  HCT 38.4 34.8*  MCV 87.1 87.4  MCH 28.1 28.6  MCHC 32.3 32.8  RDW 13.3 13.2  PLT 382 316    BNPNo results for input(s): BNP, PROBNP in the last 168 hours.   DDimer No results for input(s): DDIMER in the last 168 hours.   Radiology    ECHOCARDIOGRAM COMPLETE  Result Date: 10/31/2019    ECHOCARDIOGRAM REPORT   Patient Name:   Amy [name] Date of Exam: 10/31/2019 Medical Rec #:  [id number]  Height:       62.0 in Accession #:    [id number]            Weight:       233.2 lb Date of Birth:  1976/02/19              BSA:          2.041 m Patient Age:    44 years              BP:           156/102 mmHg Patient Gender: F                     HR:           90 bpm. Exam Location:  Inpatient Procedure: 2D Echo, Cardiac Doppler and Color Doppler Indications:    Chest pain  History:        Patient has no prior history of Echocardiogram examinations.                 Signs/Symptoms:Chest Pain; Risk Factors:Diabetes, Morbid obesity                 and Hypertension.  Sonographer:    Dustin Flock Referring Phys: [phone] CHRISTOPHER RONALD BERGE  Sonographer Comments: Patient is morbidly obese. Image acquisition challenging due to patient body habitus. IMPRESSIONS  1. Left ventricular ejection fraction, by estimation, is 65 to 70%. The left ventricle has normal function. The left ventricle has no regional wall motion abnormalities. Left ventricular diastolic parameters are consistent with Grade I diastolic dysfunction (impaired relaxation).  2. Right ventricular systolic function  is normal. The right ventricular size is normal. There is normal pulmonary artery systolic pressure.  3. The mitral valve is normal in structure. No evidence of mitral valve regurgitation. No evidence of mitral stenosis.  4. Tricuspid valve regurgitation is moderate.  5. The aortic valve is normal in structure. Aortic valve regurgitation is not visualized. No aortic stenosis is present.  6. The inferior vena cava is normal in size with greater than 50% respiratory variability, suggesting right atrial pressure of 3 mmHg. FINDINGS  Left Ventricle: Left ventricular ejection fraction, by estimation, is 65 to 70%. The left ventricle has normal function. The left ventricle has no regional wall motion abnormalities. The left ventricular internal cavity size was normal in size. There is  no left ventricular hypertrophy. Left ventricular diastolic parameters are consistent with Grade I diastolic dysfunction (impaired relaxation). Right Ventricle: The right ventricular size is normal. No increase in right ventricular wall thickness. Right ventricular systolic function is normal. There is normal pulmonary artery systolic pressure. The tricuspid regurgitant velocity is 2.74 m/s, and  with an assumed right atrial pressure of 3 mmHg, the estimated right ventricular systolic pressure is 19.1 mmHg. Left Atrium: Left atrial size was normal in size. Right Atrium: Right atrial size was normal in size. Pericardium: There is no evidence of pericardial effusion. Mitral Valve: The mitral valve is normal in structure. Normal mobility of the mitral valve leaflets. No evidence of mitral valve regurgitation. No evidence of mitral valve stenosis. Tricuspid Valve: The tricuspid valve is normal in structure. Tricuspid valve regurgitation is moderate . No evidence of tricuspid stenosis. Aortic Valve: The aortic valve is normal in structure. Aortic valve regurgitation is not visualized. No aortic stenosis is present. Pulmonic Valve: The pulmonic  valve was normal in structure. Pulmonic valve regurgitation is not visualized. No evidence of pulmonic stenosis. Aorta:  The aortic root is normal in size and structure. Venous: The inferior vena cava is normal in size with greater than 50% respiratory variability, suggesting right atrial pressure of 3 mmHg. IAS/Shunts: No atrial level shunt detected by color flow Doppler.  LEFT VENTRICLE PLAX 2D LVIDd:         3.90 cm  Diastology LVIDs:         2.80 cm  LV e' lateral:   5.00 cm/s LV PW:         1.40 cm  LV E/e' lateral: 13.0 LV IVS:        1.40 cm  LV e' medial:    4.57 cm/s LVOT diam:     2.10 cm  LV E/e' medial:  14.2 LV SV:         69 LV SV Index:   34 LVOT Area:     3.46 cm  RIGHT VENTRICLE RV Basal diam:  2.90 cm RV S prime:     5.11 cm/s TAPSE (M-mode): 2.6 cm LEFT ATRIUM             Index       RIGHT ATRIUM           Index LA diam:        3.50 cm 1.72 cm/m  RA Area:     14.70 cm LA Vol (A2C):   45.9 ml 22.49 ml/m RA Volume:   36.70 ml  17.98 ml/m LA Vol (A4C):   46.3 ml 22.69 ml/m LA Biplane Vol: 46.4 ml 22.74 ml/m  AORTIC VALVE LVOT Vmax:   91.70 cm/s LVOT Vmean:  64.300 cm/s LVOT VTI:    0.198 m  AORTA Ao Root diam: 2.80 cm MITRAL VALVE               TRICUSPID VALVE MV Area (PHT): 3.85 cm    TR Peak grad:   30.0 mmHg MV Decel Time: 197 msec    TR Vmax:        274.00 cm/s MV E velocity: 65.10 cm/s MV A velocity: 63.40 cm/s  SHUNTS MV E/A ratio:  1.03        Systemic VTI:  0.20 m                            Systemic Diam: 2.10 cm Mark Skains MD Electronically signed by Mark Skains MD Signature Date/Time: 10/31/2019/1:58:09 PM    Final     Cardiac Studies    1. Left ventricular ejection fraction, by estimation, is 65 to 70%. The  left ventricle has normal function. The left ventricle has no regional  wall motion abnormalities. Left ventricular diastolic parameters are  consistent with Grade I diastolic  dysfunction (impaired relaxation).  2. Right ventricular systolic function is normal. The  right ventricular  size is normal. There is normal pulmonary artery systolic pressure.  3. The mitral valve is normal in structure. No evidence of mitral valve  regurgitation. No evidence of mitral stenosis.  4. Tricuspid valve regurgitation is moderate.  5. The aortic valve is normal in structure. Aortic valve regurgitation is  not visualized. No aortic stenosis is present.  6. The inferior vena cava is normal in size with greater than 50%  respiratory variability, suggesting right atrial pressure of 3 mmHg.   Patient Profile     44 y.o. female with diabetes morbid obesity hypertension chest pain, pressure with deep T wave inversions in the anteroseptal and inferior   leads.  Troponin was 200 then 148.  1  Non-ST elevation myocardial infarction  Currently pain free    Plan for cath today    Continue IV heparin for now .  2  HTN   Continue valsartan hctz and carvedilol   Follow BP on higher doses     3  HL  Continue high dose statin   WIll need to follow LDL   Was 82     4  DM   Follow CS  5  Morbid obesity Needs to get wt down    For questions or updates, please contact [facility] Please consult [email or web address] for contact info under        Signed, Dietrich Pates, MD  11/02/2019, 10:05 AM

## 2019-11-02 NOTE — Progress Notes (Signed)
TR band removed after 1 hour or full deflation.  Dressed with gauze and tagaderm.  Instructions on what to do if bleeds given to patient and pspouse

## 2019-11-02 NOTE — Interval H&P Note (Signed)
History and Physical Interval Note:  11/02/2019 1:41 PM  Amy [name]  has presented today for surgery, with the diagnosis of NSTEMI.  The various methods of treatment have been discussed with the patient and family. After consideration of risks, benefits and other options for treatment, the patient has consented to  Procedure(s): LEFT HEART CATH AND CORONARY ANGIOGRAPHY (N/A) as a surgical intervention.  The patient's history has been reviewed, patient examined, no change in status, stable for surgery.  I have reviewed the patient's chart and labs.  Questions were answered to the patient's satisfaction.     Tonny Bollman

## 2019-11-02 NOTE — Discharge Summary (Signed)
Discharge Summary    Patient ID: Amy [name] MRN: [id number]; DOB: 21-Jan-1976  Admit date: 10/30/2019 Discharge date: 11/02/2019  Primary Care Provider: Antony Blackbird, MD  Primary Cardiologist: Pixie Casino, MD   Discharge Diagnoses    Active Problems:   NSTEMI (non-ST elevated myocardial infarction) ([place])   Acute coronary syndrome ([place])   CAd   HTN  HLD  DM   Diagnostic Studies/Procedures    LEFT HEART CATH AND CORONARY ANGIOGRAPHY  Conclusion    Ramus lesion is 50% stenosed.   Patent coronary arteries with no evidence of obstructive CAD, with the exception of catheter-induced vasospasm in a small intermediate branch  Recommend: medical therapy    Diagnostic Dominance: Right      Echo 10/31/19 1. Left ventricular ejection fraction, by estimation, is 65 to 70%. The  left ventricle has normal function. The left ventricle has no regional  wall motion abnormalities. Left ventricular diastolic parameters are  consistent with Grade I diastolic  dysfunction (impaired relaxation).  2. Right ventricular systolic function is normal. The right ventricular  size is normal. There is normal pulmonary artery systolic pressure.  3. The mitral valve is normal in structure. No evidence of mitral valve  regurgitation. No evidence of mitral stenosis.  4. Tricuspid valve regurgitation is moderate.  5. The aortic valve is normal in structure. Aortic valve regurgitation is  not visualized. No aortic stenosis is present.  6. The inferior vena cava is normal in size with greater than 50%  respiratory variability, suggesting right atrial pressure of 3 mmHg.  History of Present Illness     Amy [name] is a 44 y.o. female with hx of HTN, anemia, diabetes and obesity presented to [facility] for chest pain and found to have elevated troponin.   Presented with intermittent substernal chest pressure. Symptoms improved after OTC analgesic but return. 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 EMS ECG showed deep TWI in I, II, aVL, V2-V6.  She was taken to the [facility] where she was hypertensive.  High-sensitivity troponin returned elevated at 200 with delta troponin of 148.  She was placed on heparin and subsequently transferred to the [facility] ED where she is currently chest pain-free.  Hospital Course     Consultants: None  1. NSTEMI - Hs troponin 200>>148>>122>>106 - Echo showed LVEF of 65-70%, no WM abnormality, grade 1 DD.  - Cath showed mild non obstructive CAD with 50% RI. There was catheter induced vasospasm in RI. Recommended medical therapy.  - Continue ASA, statin and BB (antihypertensive and antianginal).  - No recurrent chest pain.    2. HTN - BP was elevated. Started Coreg and up-titrated.  - Continued home Varsartan equivalent and HCTZ - BP improved  3. HLD - 11/01/2019: Cholesterol 139; HDL 34; LDL Cholesterol 82; Triglycerides 117; VLDL 23  - continue statin  - Consider OP f/u labs 6-8 weeks given statin initiation this admission.  4. DM - Treated with SSI while admitted  - Resumed home Metformin (hold 48 hours post cath)  Did the patient have an acute coronary syndrome (MI, NSTEMI, STEMI, etc) this admission?:  Yes                               AHA/ACC Clinical Performance & Quality Measures: 1. Aspirin prescribed? - Yes 2. ADP Receptor Inhibitor (Plavix/Clopidogrel, Brilinta/Ticagrelor or Effient/Prasugrel) prescribed (includes medically managed patients)? -  No - No PCI - medical therapy 3. Beta Blocker prescribed? - Yes 4. High Intensity Statin (Lipitor 40-49m or Crestor 20-480m prescribed? - Yes 5. EF assessed during THIS hospitalization? - Yes 6. For EF <40%, was ACEI/ARB prescribed? - Not Applicable (EF >/= 4016%Already on ARB (Valsartan) 7. For EF <40%, Aldosterone Antagonist (Spironolactone or Eplerenone) prescribed? - Not Applicable (EF >/= 4001%8. Cardiac Rehab Phase  II ordered (including medically managed patients)? - No - NO PCI   _____________  Discharge Vitals Blood pressure (!) 147/84, pulse (!) 0, temperature 97.9 F (36.6 C), temperature source Oral, resp. rate (!) 0, height _0  (1.575 m), weight 105.8 kg, SpO2 (!) 0 %.  Filed Weights   10/30/19 1435 10/30/19 2309  Weight: 105.3 kg 105.8 kg    Labs & Radiologic Studies    CBC Recent Labs    11/02/19 0651  WBC 6.2  HGB 11.4*  HCT 34.8*  MCV 87.4  PLT 31093 Basic Metabolic Panel Recent Labs    10/31/19 0215 11/02/19 0651  NA 136 136  K 3.6 3.7  CL 102 100  CO2 26 26  GLUCOSE 159* 165*  BUN 9 12  CREATININE 0.77 0.87  CALCIUM 8.7* 8.7*   Liver Function Tests Recent Labs    10/31/19 0215  AST 19  ALT 14  ALKPHOS 79  BILITOT 0.5  PROT 7.2  ALBUMIN 3.0*   High Sensitivity Troponin:   Recent Labs  Lab 10/30/19 1449 10/30/19 1635 10/30/19 2137 10/30/19 2328  TROPONINIHS 200* 148* 122* 106*    Hemoglobin A1C Recent Labs    10/30/19 2137  HGBA1C 7.6*   Fasting Lipid Panel Recent Labs    11/01/19 0726  CHOL 139  HDL 34*  LDLCALC 82  TRIG 117  CHOLHDL 4.1  _____________  CARDIAC CATHETERIZATION  Result Date: 11/02/2019  Ramus lesion is 50% stenosed.  Patent coronary arteries with no evidence of obstructive CAD, with the exception of catheter-induced vasospasm in a small intermediate branch Recommend: medical therapy   DG Chest Portable 1 View  Result Date: 10/30/2019 CLINICAL DATA:  Chest pain. Additional history provided: Chest tenderness, pain with breathing, sneezing and coughing yesterday EXAM: PORTABLE CHEST 1 VIEW COMPARISON:  Chest radiograph 07/30/2019 FINDINGS: Heart size at the upper limits of normal, unchanged. Minimal opacity within the medial right lung base has an appearance most suggestive of atelectasis. No definite airspace consolidation. No frank pulmonary edema. No evidence of pleural effusion or pneumothorax. No acute bony abnormality  identified. IMPRESSION: Minimal ill-defined opacity within the medial right lung base has an appearance most suggestive of atelectasis. No definite airspace consolidation. Heart size at the upper limits of normal, unchanged. Electronically Signed   By: KyKellie SimmeringO   On: 10/30/2019 15:41   ECHOCARDIOGRAM COMPLETE  Result Date: 10/31/2019    ECHOCARDIOGRAM REPORT   Patient Name:   SHDORCUS RIGAate of Exam: 10/31/2019 Medical Rec #:  [id number]           Height:       62.0 in Accession #:    [id number]          Weight:       233.2 lb Date of Birth:  3/06-17-1977            BSA:          2.041 m Patient Age:    [age]  BP:           156/102 mmHg Patient Gender: F                     HR:           90 bpm. Exam Location:  Inpatient Procedure: 2D Echo, Cardiac Doppler and Color Doppler Indications:    Chest pain  History:        Patient has no prior history of Echocardiogram examinations.                 Signs/Symptoms:Chest Pain; Risk Factors:Diabetes, Morbid obesity                 and Hypertension.  Sonographer:    Dustin Flock Referring Phys: [phone] CHRISTOPHER RONALD BERGE  Sonographer Comments: Patient is morbidly obese. Image acquisition challenging due to patient body habitus. IMPRESSIONS  1. Left ventricular ejection fraction, by estimation, is 65 to 70%. The left ventricle has normal function. The left ventricle has no regional wall motion abnormalities. Left ventricular diastolic parameters are consistent with Grade I diastolic dysfunction (impaired relaxation).  2. Right ventricular systolic function is normal. The right ventricular size is normal. There is normal pulmonary artery systolic pressure.  3. The mitral valve is normal in structure. No evidence of mitral valve regurgitation. No evidence of mitral stenosis.  4. Tricuspid valve regurgitation is moderate.  5. The aortic valve is normal in structure. Aortic valve regurgitation is not visualized. No aortic stenosis is  present.  6. The inferior vena cava is normal in size with greater than 50% respiratory variability, suggesting right atrial pressure of 3 mmHg. FINDINGS  Left Ventricle: Left ventricular ejection fraction, by estimation, is 65 to 70%. The left ventricle has normal function. The left ventricle has no regional wall motion abnormalities. The left ventricular internal cavity size was normal in size. There is  no left ventricular hypertrophy. Left ventricular diastolic parameters are consistent with Grade I diastolic dysfunction (impaired relaxation). Right Ventricle: The right ventricular size is normal. No increase in right ventricular wall thickness. Right ventricular systolic function is normal. There is normal pulmonary artery systolic pressure. The tricuspid regurgitant velocity is 2.74 m/s, and  with an assumed right atrial pressure of 3 mmHg, the estimated right ventricular systolic pressure is 26.8 mmHg. Left Atrium: Left atrial size was normal in size. Right Atrium: Right atrial size was normal in size. Pericardium: There is no evidence of pericardial effusion. Mitral Valve: The mitral valve is normal in structure. Normal mobility of the mitral valve leaflets. No evidence of mitral valve regurgitation. No evidence of mitral valve stenosis. Tricuspid Valve: The tricuspid valve is normal in structure. Tricuspid valve regurgitation is moderate . No evidence of tricuspid stenosis. Aortic Valve: The aortic valve is normal in structure. Aortic valve regurgitation is not visualized. No aortic stenosis is present. Pulmonic Valve: The pulmonic valve was normal in structure. Pulmonic valve regurgitation is not visualized. No evidence of pulmonic stenosis. Aorta: The aortic root is normal in size and structure. Venous: The inferior vena cava is normal in size with greater than 50% respiratory variability, suggesting right atrial pressure of 3 mmHg. IAS/Shunts: No atrial level shunt detected by color flow Doppler.  LEFT  VENTRICLE PLAX 2D LVIDd:         3.90 cm  Diastology LVIDs:         2.80 cm  LV e' lateral:   5.00 cm/s LV PW:  1.40 cm  LV E/e' lateral: 13.0 LV IVS:        1.40 cm  LV e' medial:    4.57 cm/s LVOT diam:     2.10 cm  LV E/e' medial:  14.2 LV SV:         69 LV SV Index:   34 LVOT Area:     3.46 cm  RIGHT VENTRICLE RV Basal diam:  2.90 cm RV S prime:     5.11 cm/s TAPSE (M-mode): 2.6 cm LEFT ATRIUM             Index       RIGHT ATRIUM           Index LA diam:        3.50 cm 1.72 cm/m  RA Area:     14.70 cm LA Vol (A2C):   45.9 ml 22.49 ml/m RA Volume:   36.70 ml  17.98 ml/m LA Vol (A4C):   46.3 ml 22.69 ml/m LA Biplane Vol: 46.4 ml 22.74 ml/m  AORTIC VALVE LVOT Vmax:   91.70 cm/s LVOT Vmean:  64.300 cm/s LVOT VTI:    0.198 m  AORTA Ao Root diam: 2.80 cm MITRAL VALVE               TRICUSPID VALVE MV Area (PHT): 3.85 cm    TR Peak grad:   30.0 mmHg MV Decel Time: 197 msec    TR Vmax:        274.00 cm/s MV E velocity: 65.10 cm/s MV A velocity: 63.40 cm/s  SHUNTS MV E/A ratio:  1.03        Systemic VTI:  0.20 m                            Systemic Diam: 2.10 cm Candee Furbish MD Electronically signed by Candee Furbish MD Signature Date/Time: 10/31/2019/1:58:09 PM    Final    Disposition   Pt is being discharged home today in good condition.  Follow-up Plans & Appointments    Follow-up Information    Almyra Deforest, [place]. Go on 11/11/2019.   Specialties: Cardiology, Radiology Why: _0 :15pm for hospital follow up with Dr. Lysbeth Penner PA, please arrive 15 minutes early  Contact information: [address] [place] [address] [phone]          Discharge Instructions    Amb Referral to Cardiac Rehabilitation   Complete by: As directed    Diagnosis: NSTEMI   After initial evaluation and assessments completed: Virtual Based Care may be provided alone or in conjunction with Phase 2 Cardiac Rehab based on patient barriers.: Yes   Diet - low sodium heart healthy   Complete by: As directed      Discharge instructions   Complete by: As directed    NO HEAVY LIFTING (>10lbs) X 2 WEEKS. NO SEXUAL ACTIVITY X 2 WEEKS. NO DRIVING X 5 DAYS.  NO SOAKING BATHS, HOT TUBS, POOLS, ETC., X 7 DAYS.  Hold metformin for 2 days. Resume on 11/05/19.   Increase activity slowly   Complete by: As directed       Discharge Medications   Allergies as of 11/02/2019      Reactions   Amlodipine Hives   Hydrocodone-acetaminophen Hives, Itching   And itching Itching and hives   Losartan Potassium-hctz Other (See Comments)   Dizziness   Tetanus Toxoids    Arm swelling size of a softball.  Medication List    TAKE these medications   aspirin 81 MG EC tablet Take 1 tablet (81 mg total) by mouth daily. Start taking on: November 03, 2019   atorvastatin 80 MG tablet Commonly known as: LIPITOR Take 1 tablet (80 mg total) by mouth daily.   carvedilol 6.25 MG tablet Commonly known as: COREG Take 1 tablet (6.25 mg total) by mouth 2 (two) times daily with a meal.   fexofenadine-pseudoephedrine 180-240 MG 24 hr tablet Commonly known as: ALLEGRA-D 24 Take 1 tablet by mouth daily.   ibuprofen 600 MG tablet Commonly known as: ADVIL Take 1 tablet (600 mg total) by mouth every 6 (six) hours as needed. What changed: Another medication with the same name was changed. Make sure you understand how and when to take each.   ibuprofen 600 MG tablet Commonly known as: ADVIL Take 1 tablet (600 mg total) by mouth every 6 (six) hours as needed. What changed: reasons to take this   metFORMIN 500 MG 24 hr tablet Commonly known as: GLUCOPHAGE-XR Take 2 tablets (1,000 mg total) by mouth daily after supper.   multivitamin with minerals Tabs tablet Take 1 tablet by mouth daily.   nitroGLYCERIN 0.4 MG SL tablet Commonly known as: NITROSTAT Place 1 tablet (0.4 mg total) under the tongue every 5 (five) minutes x 3 doses as needed for chest pain.   ondansetron 4 MG disintegrating tablet Commonly known as:  Zofran ODT 64m ODT q4 hours prn nausea/vomit What changed:   how much to take  how to take this  when to take this  reasons to take this  additional instructions   True Metrix Blood Glucose Test test strip Generic drug: glucose blood Use as instructed to check BS twice per day What changed:   how much to take  how to take this  when to take this   True Metrix Meter w/Device Kit Use to check blood sugars twice per day What changed:   how much to take  how to take this  when to take this   TRUEplus Lancets 28G Misc Use when checking BS twice per day What changed:   how much to take  how to take this  when to take this   valsartan-hydrochlorothiazide 80-12.5 MG tablet Commonly known as: DIOVAN-HCT Take 1 tablet by mouth daily.          Outstanding Labs/Studies   Consider OP f/u labs 6-8 weeks given statin initiation this admission.  Duration of Discharge Encounter   Greater than 30 minutes including physician time.  SMahalia LongestBTidmore Bend PA 11/02/2019, 3:07 PM

## 2019-11-02 NOTE — Progress Notes (Signed)
Patient via wheelchair in stable condition to spouses car

## 2019-11-02 NOTE — Interval H&P Note (Signed)
Cath Lab Visit (complete for each Cath Lab visit)  Clinical Evaluation Leading to the Procedure:   ACS: Yes.    Non-ACS:    Anginal Classification: CCS IV  Anti-ischemic medical therapy: Minimal Therapy (1 class of medications)  Non-Invasive Test Results: No non-invasive testing performed  Prior CABG: No previous CABG      History and Physical Interval Note:  11/02/2019 1:01 PM  Amy [name]  has presented today for surgery, with the diagnosis of NSTEMI.  The various methods of treatment have been discussed with the patient and family. After consideration of risks, benefits and other options for treatment, the patient has consented to  Procedure(s): LEFT HEART CATH AND CORONARY ANGIOGRAPHY (N/A) as a surgical intervention.  The patient's history has been reviewed, patient examined, no change in status, stable for surgery.  I have reviewed the patient's chart and labs.  Questions were answered to the patient's satisfaction.     Tonny Bollman

## 2019-11-02 NOTE — Telephone Encounter (Signed)
A TOC hospital follow up has been scheduled for 11/11/19 at 2:15 PM with Azalee Course per [facility].

## 2019-11-02 NOTE — Telephone Encounter (Signed)
Patient is currently admitted.  Will monitor for TOC call.  Thanks!

## 2019-11-02 NOTE — H&P (View-Only) (Signed)
Progress Note  Patient Name: Amy [name] Date of Encounter: 11/02/2019  [facility] Cardiologist: Chrystie Nose, MD   Subjective   Pt denies CP   Breathing is OK    Inpatient Medications    Scheduled Meds: . aspirin EC  81 mg Oral Daily  . atorvastatin  80 mg Oral Daily  . carvedilol  6.25 mg Oral BID WC  . hydrochlorothiazide  12.5 mg Oral Daily  . insulin aspart  0-15 Units Subcutaneous TID WC  . irbesartan  75 mg Oral Daily  . loratadine  10 mg Oral Daily  . sodium chloride flush  3 mL Intravenous Q12H   Continuous Infusions: . sodium chloride    . sodium chloride 1 mL/kg/hr (11/02/19 0418)  . heparin 1,050 Units/hr (11/02/19 0418)   PRN Meds: sodium chloride, acetaminophen, nitroGLYCERIN, ondansetron (ZOFRAN) IV, sodium chloride flush   Vital Signs    Vitals:   11/01/19 1856 11/01/19 2303 11/02/19 0400 11/02/19 0707  BP: 136/80 (!) 155/82 136/82 (!) 142/86  Pulse: 79 79  81  Resp: 14 14 (!) 0 19  Temp: 98.1 F (36.7 C) 98.3 F (36.8 C) 98.2 F (36.8 C) 98.1 F (36.7 C)  TempSrc: Oral Oral Oral Oral  SpO2: 97% 99%  97%  Weight:      Height:        Intake/Output Summary (Last 24 hours) at 11/02/2019 1005 Last data filed at 11/02/2019 0418 Gross per 24 hour  Intake 724.6 ml  Output 600 ml  Net 124.6 ml   Last 3 Weights 10/30/2019 10/30/2019 10/16/2019  Weight (lbs) 233 lb 4 oz 232 lb 3.2 oz 232 lb  Weight (kg) 105.8 kg 105.325 kg 105.235 kg      Telemetry    SR  - Personally Reviewed  ECG    No new- Personally Reviewed  Physical Exam   GEN: morbidly obese 44 yo in NAD     Neck: No JVD Cardiac: RRR, no murmurs Respiratory: Clear to auscultation bilaterally. GI: Soft, nontender, non-distended  MS: No LE  edema;  Neuro:  Nonfocal  Psych: Normal affect   Labs    High Sensitivity Troponin:   Recent Labs  Lab 10/30/19 1449 10/30/19 1635 10/30/19 2137 10/30/19 2328  TROPONINIHS 200* 148* 122* 106*      Chemistry Recent  Labs  Lab 10/30/19 1449 10/31/19 0215  NA 136 136  K 3.3* 3.6  CL 100 102  CO2 26 26  GLUCOSE 148* 159*  BUN 9 9  CREATININE 0.87 0.77  CALCIUM 8.9 8.7*  PROT  --  7.2  ALBUMIN  --  3.0*  AST  --  19  ALT  --  14  ALKPHOS  --  79  BILITOT  --  0.5  GFRNONAA >60 >60  GFRAA >60 >60  ANIONGAP 10 8     Hematology Recent Labs  Lab 10/30/19 1449 11/02/19 0651  WBC 7.9 6.2  RBC 4.41 3.98  HGB 12.4 11.4*  HCT 38.4 34.8*  MCV 87.1 87.4  MCH 28.1 28.6  MCHC 32.3 32.8  RDW 13.3 13.2  PLT 382 316    BNPNo results for input(s): BNP, PROBNP in the last 168 hours.   DDimer No results for input(s): DDIMER in the last 168 hours.   Radiology    ECHOCARDIOGRAM COMPLETE  Result Date: 10/31/2019    ECHOCARDIOGRAM REPORT   Patient Name:   Amy [name] Date of Exam: 10/31/2019 Medical Rec #:  [id number]  Height:       62.0 in Accession #:    [id number]            Weight:       233.2 lb Date of Birth:  1976/02/19              BSA:          2.041 m Patient Age:    44 years              BP:           156/102 mmHg Patient Gender: F                     HR:           90 bpm. Exam Location:  Inpatient Procedure: 2D Echo, Cardiac Doppler and Color Doppler Indications:    Chest pain  History:        Patient has no prior history of Echocardiogram examinations.                 Signs/Symptoms:Chest Pain; Risk Factors:Diabetes, Morbid obesity                 and Hypertension.  Sonographer:    Dustin Flock Referring Phys: [phone] CHRISTOPHER RONALD BERGE  Sonographer Comments: Patient is morbidly obese. Image acquisition challenging due to patient body habitus. IMPRESSIONS  1. Left ventricular ejection fraction, by estimation, is 65 to 70%. The left ventricle has normal function. The left ventricle has no regional wall motion abnormalities. Left ventricular diastolic parameters are consistent with Grade I diastolic dysfunction (impaired relaxation).  2. Right ventricular systolic function  is normal. The right ventricular size is normal. There is normal pulmonary artery systolic pressure.  3. The mitral valve is normal in structure. No evidence of mitral valve regurgitation. No evidence of mitral stenosis.  4. Tricuspid valve regurgitation is moderate.  5. The aortic valve is normal in structure. Aortic valve regurgitation is not visualized. No aortic stenosis is present.  6. The inferior vena cava is normal in size with greater than 50% respiratory variability, suggesting right atrial pressure of 3 mmHg. FINDINGS  Left Ventricle: Left ventricular ejection fraction, by estimation, is 65 to 70%. The left ventricle has normal function. The left ventricle has no regional wall motion abnormalities. The left ventricular internal cavity size was normal in size. There is  no left ventricular hypertrophy. Left ventricular diastolic parameters are consistent with Grade I diastolic dysfunction (impaired relaxation). Right Ventricle: The right ventricular size is normal. No increase in right ventricular wall thickness. Right ventricular systolic function is normal. There is normal pulmonary artery systolic pressure. The tricuspid regurgitant velocity is 2.74 m/s, and  with an assumed right atrial pressure of 3 mmHg, the estimated right ventricular systolic pressure is 19.1 mmHg. Left Atrium: Left atrial size was normal in size. Right Atrium: Right atrial size was normal in size. Pericardium: There is no evidence of pericardial effusion. Mitral Valve: The mitral valve is normal in structure. Normal mobility of the mitral valve leaflets. No evidence of mitral valve regurgitation. No evidence of mitral valve stenosis. Tricuspid Valve: The tricuspid valve is normal in structure. Tricuspid valve regurgitation is moderate . No evidence of tricuspid stenosis. Aortic Valve: The aortic valve is normal in structure. Aortic valve regurgitation is not visualized. No aortic stenosis is present. Pulmonic Valve: The pulmonic  valve was normal in structure. Pulmonic valve regurgitation is not visualized. No evidence of pulmonic stenosis. Aorta:  The aortic root is normal in size and structure. Venous: The inferior vena cava is normal in size with greater than 50% respiratory variability, suggesting right atrial pressure of 3 mmHg. IAS/Shunts: No atrial level shunt detected by color flow Doppler.  LEFT VENTRICLE PLAX 2D LVIDd:         3.90 cm  Diastology LVIDs:         2.80 cm  LV e' lateral:   5.00 cm/s LV PW:         1.40 cm  LV E/e' lateral: 13.0 LV IVS:        1.40 cm  LV e' medial:    4.57 cm/s LVOT diam:     2.10 cm  LV E/e' medial:  14.2 LV SV:         69 LV SV Index:   34 LVOT Area:     3.46 cm  RIGHT VENTRICLE RV Basal diam:  2.90 cm RV S prime:     5.11 cm/s TAPSE (M-mode): 2.6 cm LEFT ATRIUM             Index       RIGHT ATRIUM           Index LA diam:        3.50 cm 1.72 cm/m  RA Area:     14.70 cm LA Vol (A2C):   45.9 ml 22.49 ml/m RA Volume:   36.70 ml  17.98 ml/m LA Vol (A4C):   46.3 ml 22.69 ml/m LA Biplane Vol: 46.4 ml 22.74 ml/m  AORTIC VALVE LVOT Vmax:   91.70 cm/s LVOT Vmean:  64.300 cm/s LVOT VTI:    0.198 m  AORTA Ao Root diam: 2.80 cm MITRAL VALVE               TRICUSPID VALVE MV Area (PHT): 3.85 cm    TR Peak grad:   30.0 mmHg MV Decel Time: 197 msec    TR Vmax:        274.00 cm/s MV E velocity: 65.10 cm/s MV A velocity: 63.40 cm/s  SHUNTS MV E/A ratio:  1.03        Systemic VTI:  0.20 m                            Systemic Diam: 2.10 cm Donato Schultz MD Electronically signed by Donato Schultz MD Signature Date/Time: 10/31/2019/1:58:09 PM    Final     Cardiac Studies    1. Left ventricular ejection fraction, by estimation, is 65 to 70%. The  left ventricle has normal function. The left ventricle has no regional  wall motion abnormalities. Left ventricular diastolic parameters are  consistent with Grade I diastolic  dysfunction (impaired relaxation).  2. Right ventricular systolic function is normal. The  right ventricular  size is normal. There is normal pulmonary artery systolic pressure.  3. The mitral valve is normal in structure. No evidence of mitral valve  regurgitation. No evidence of mitral stenosis.  4. Tricuspid valve regurgitation is moderate.  5. The aortic valve is normal in structure. Aortic valve regurgitation is  not visualized. No aortic stenosis is present.  6. The inferior vena cava is normal in size with greater than 50%  respiratory variability, suggesting right atrial pressure of 3 mmHg.   Patient Profile     44 y.o. female with diabetes morbid obesity hypertension chest pain, pressure with deep T wave inversions in the anteroseptal and inferior  leads.  Troponin was 200 then 148.  1  Non-ST elevation myocardial infarction  Currently pain free    Plan for cath today    Continue IV heparin for now .  2  HTN   Continue valsartan hctz and carvedilol   Follow BP on higher doses     3  HL  Continue high dose statin   WIll need to follow LDL   Was 82     4  DM   Follow CS  5  Morbid obesity Needs to get wt down    For questions or updates, please contact [facility] Please consult [email or web address] for contact info under        Signed, Dietrich Pates, MD  11/02/2019, 10:05 AM

## 2019-11-02 NOTE — H&P (Signed)
History & Physical    Patient ID: Amy [name] MRN: [id number], DOB/AGE: 1976-02-18   Admit date: 10/30/2019   Primary Physician: Antony Blackbird, MD Primary Cardiologist: Pixie Casino, MD  Patient Profile    44 y/o ? w/ a h/o anemia, diabetes, obesity, and HTN, who presents on transfer from [place] secondary to chest pain and non-STEMI.  Past Medical History    Past Medical History:  Diagnosis Date  . Anemia   . Diabetes mellitus without complication ([place])   . Hypertension     Past Surgical History:  Procedure Laterality Date  . CHOLECYSTECTOMY  2006  . DILITATION & CURRETTAGE/HYSTROSCOPY WITH HYDROTHERMAL ABLATION N/A 07/10/2013   Procedure: DILATATION & CURETTAGE/HYSTEROSCOPY WITH HYDROTHERMAL ABLATION;  Surgeon: Shelly Bombard, MD;  Location: [facility];  Service: Gynecology;  Laterality: N/A;  . TUBAL LIGATION  1997  . WISDOM TOOTH EXTRACTION  1998     Allergies  Allergies  Allergen Reactions  . Amlodipine Hives  . Hydrocodone-Acetaminophen Hives and Itching    And itching Itching and hives  . Losartan Potassium-Hctz Other (See Comments)    Dizziness  . Tetanus Toxoids     Arm swelling size of a softball.     History of Present Illness    44 y/o ? w/ a h/o anemia, DM, and HTN.  She was in her USOH until yesterday, when she started experiencing retrosternal chest discomfort that occurred intermittently throughout the day.  Symptoms finally resolved in the evening after taking over-the-counter analgesic and she was able to sleep well.  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  EMS ECG showed deep TWI in I, II, aVL, V2-V6.  She was taken to the [facility] where she was hypertensive.  High-sensitivity troponin returned elevated at 200 with delta troponin of 148.  She was placed on heparin and subsequently transferred to the [facility] ED where she is currently chest pain-free.  Home  Medications    Prior to Admission medications   Medication Sig Start Date End Date Taking? Authorizing Provider  Blood Glucose Monitoring Suppl (TRUE METRIX METER) w/Device KIT Use to check blood sugars twice per day Patient taking differently: 1 each by Other route See admin instructions. Use to check blood sugars twice per day 08/20/19  Yes Fulp, Cammie, MD  fexofenadine-pseudoephedrine (ALLEGRA-D 24) 180-240 MG 24 hr tablet Take 1 tablet by mouth daily.   Yes [provider]  glucose blood (TRUE METRIX BLOOD GLUCOSE TEST) test strip Use as instructed to check BS twice per day Patient taking differently: 1 each by Other route See admin instructions. Use as instructed to check BS twice per day 08/20/19  Yes Fulp, Cammie, MD  ibuprofen (ADVIL) 600 MG tablet Take 1 tablet (600 mg total) by mouth every 6 (six) hours as needed. Patient taking differently: Take 600 mg by mouth every 6 (six) hours as needed for mild pain.  10/16/19  Yes Aberman, Druscilla Brownie, PA-C  metFORMIN (GLUCOPHAGE-XR) 500 MG 24 hr tablet Take 2 tablets (1,000 mg total) by mouth daily after supper. 09/11/19  Yes Fulp, Cammie, MD  Multiple Vitamin (MULTIVITAMIN WITH MINERALS) TABS tablet Take 1 tablet by mouth daily.   Yes [provider]  ondansetron (ZOFRAN ODT) 4 MG disintegrating tablet 9m ODT q4 hours prn nausea/vomit Patient taking differently: Take 4 mg by mouth every 4 (four) hours as needed for nausea or vomiting.  09/29/19  Yes Milton Ferguson, MD  TRUEplus Lancets 28G MISC Use when checking BS twice per day Patient taking differently: 1 each by Other route See admin instructions. Use when checking BS twice per day 08/20/19  Yes Fulp, Cammie, MD  valsartan-hydrochlorothiazide (DIOVAN-HCT) 80-12.5 MG tablet Take 1 tablet by mouth daily. 10/14/19  Yes Fulp, Cammie, MD  ibuprofen (ADVIL) 600 MG tablet Take 1 tablet (600 mg total) by mouth every 6 (six) hours as needed. Patient not taking: Reported on 10/30/2019 07/31/19    Isla Pence, MD    Family History    Family History  Problem Relation Age of Onset  . Breast cancer Mother   . Hypertension Mother   . COPD Other   . Hypertension Other   . Diabetes Other   . Cancer Other   . Heart failure Maternal Aunt    She indicated that her mother is alive. She indicated that her father is alive. She indicated that the status of her maternal aunt is unknown.   Social History    Social History   Socioeconomic History  . Marital status: Married    Spouse name: Jerald Kief  . Number of children: 2  . Years of education: Not on file  . Highest education level: Some college, no degree  Occupational History  . 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 Smoking status: Never Smoker  . Smokeless tobacco: Never Used  Substance and Sexual Activity  . Alcohol use: No  . Drug use: No  . Sexual activity: Not on file  Other Topics Concern  . Not on file  Social History Narrative   Lives locally.  Does not routinely exercise.  In school @ [employer] for nsg.   Social Determinants of Health   Financial Resource Strain:   . Difficulty of Paying Living Expenses:   Food Insecurity:   . Worried About Charity fundraiser in the Last Year:   . Arboriculturist in the Last Year:   Transportation Needs:   . Film/video editor (Medical):   Marland Kitchen Lack of Transportation (Non-Medical):   Physical Activity:   . Days of Exercise per Week:   . Minutes of Exercise per Session:   Stress:   . Feeling of Stress :   Social Connections:   . Frequency of Communication with Friends and Family:   . Frequency of Social Gatherings with Friends and Family:   . Attends Religious Services:   . Active Member of Clubs or Organizations:   . Attends Archivist Meetings:   Marland Kitchen Marital Status:   Intimate Partner Violence:   . Fear of Current or Ex-Partner:   . Emotionally Abused:   Marland Kitchen Physically Abused:   . Sexually Abused:        Review of Systems    General:  No chills, fever, night sweats or weight changes.  Cardiovascular:  +++ chest pain, no dyspnea on exertion, edema, orthopnea, palpitations, paroxysmal nocturnal dyspnea. Dermatological: No rash, lesions/masses Respiratory: No cough, dyspnea Urologic: No hematuria, dysuria Abdominal:   No nausea, vomiting, diarrhea, bright red blood per rectum, melena, or hematemesis Neurologic:  No visual changes, wkns, changes in mental status. All other systems reviewed and are otherwise negative except as noted above.  Physical Exam    Blood pressure (!) 142/86, pulse 81, temperature 98.1 F (36.7 C), temperature source Oral, resp. rate 19, height 5' 2"  (1.575 m), weight 105.8 kg, SpO2 97 %.  General: Pleasant, NAD Psych: Normal affect. Neuro: Alert and oriented X 3. Moves all extremities spontaneously. HEENT: Normal  Neck: Supple without bruits or JVD. Lungs:  Resp regular and unlabored, CTA. Heart: RRR no s3, s4, or murmurs. Abdomen: Obese, soft, non-tender, non-distended, BS + x 4.  Extremities: No clubbing, cyanosis or edema. DP/PT/Radials 2+ and equal bilaterally.  Labs    Cardiac Enzymes Recent Labs  Lab 10/30/19 1449 10/30/19 1635 10/30/19 2137 10/30/19 2328  TROPONINIHS 200* 148* 122* 106*      Lab Results  Component Value Date   WBC 6.2 11/02/2019   HGB 11.4 (L) 11/02/2019   HCT 34.8 (L) 11/02/2019   MCV 87.4 11/02/2019   PLT 316 11/02/2019    Recent Labs  Lab 10/31/19 0215  NA 136  K 3.6  CL 102  CO2 26  BUN 9  CREATININE 0.77  CALCIUM 8.7*  PROT 7.2  BILITOT 0.5  ALKPHOS 79  ALT 14  AST 19  GLUCOSE 159*    Radiology Studies    DG Chest Portable 1 View  Result Date: 10/30/2019 CLINICAL DATA:  Chest pain. Additional history provided: Chest tenderness, pain with breathing, sneezing and coughing yesterday EXAM: PORTABLE CHEST 1 VIEW COMPARISON:  Chest radiograph 07/30/2019 FINDINGS: Heart size at the upper limits of  normal, unchanged. Minimal opacity within the medial right lung base has an appearance most suggestive of atelectasis. No definite airspace consolidation. No frank pulmonary edema. No evidence of pleural effusion or pneumothorax. No acute bony abnormality identified. IMPRESSION: Minimal ill-defined opacity within the medial right lung base has an appearance most suggestive of atelectasis. No definite airspace consolidation. Heart size at the upper limits of normal, unchanged. Electronically Signed   By: Kellie Simmering DO   On: 10/30/2019 15:41   ECHOCARDIOGRAM COMPLETE  Result Date: 10/31/2019    ECHOCARDIOGRAM REPORT   Patient Name:   Amy [name] Date of Exam: 10/31/2019 Medical Rec #:  [id number]             Height:       62.0 in Accession #:    [id number]            Weight:       233.2 lb Date of Birth:  1976/03/03              BSA:          2.041 m Patient Age:    19 years              BP:           156/102 mmHg Patient Gender: F                     HR:           90 bpm. Exam Location:  Inpatient Procedure: 2D Echo, Cardiac Doppler and Color Doppler Indications:    Chest pain  History:        Patient has no prior history of Echocardiogram examinations.                 Signs/Symptoms:Chest Pain; Risk Factors:Diabetes, Morbid obesity                 and Hypertension.  Sonographer:    Dustin Flock Referring Phys: [phone] CHRISTOPHER RONALD BERGE  Sonographer Comments: Patient is morbidly obese. Image acquisition challenging due to patient body habitus. IMPRESSIONS  1. Left ventricular ejection fraction, by estimation, is 65 to 70%. The left  ventricle has normal function. The left ventricle has no regional wall motion abnormalities. Left ventricular diastolic parameters are consistent with Grade I diastolic dysfunction (impaired relaxation).  2. Right ventricular systolic function is normal. The right ventricular size is normal. There is normal pulmonary artery systolic pressure.  3. The mitral valve is  normal in structure. No evidence of mitral valve regurgitation. No evidence of mitral stenosis.  4. Tricuspid valve regurgitation is moderate.  5. The aortic valve is normal in structure. Aortic valve regurgitation is not visualized. No aortic stenosis is present.  6. The inferior vena cava is normal in size with greater than 50% respiratory variability, suggesting right atrial pressure of 3 mmHg. FINDINGS  Left Ventricle: Left ventricular ejection fraction, by estimation, is 65 to 70%. The left ventricle has normal function. The left ventricle has no regional wall motion abnormalities. The left ventricular internal cavity size was normal in size. There is  no left ventricular hypertrophy. Left ventricular diastolic parameters are consistent with Grade I diastolic dysfunction (impaired relaxation). Right Ventricle: The right ventricular size is normal. No increase in right ventricular wall thickness. Right ventricular systolic function is normal. There is normal pulmonary artery systolic pressure. The tricuspid regurgitant velocity is 2.74 m/s, and  with an assumed right atrial pressure of 3 mmHg, the estimated right ventricular systolic pressure is 95.0 mmHg. Left Atrium: Left atrial size was normal in size. Right Atrium: Right atrial size was normal in size. Pericardium: There is no evidence of pericardial effusion. Mitral Valve: The mitral valve is normal in structure. Normal mobility of the mitral valve leaflets. No evidence of mitral valve regurgitation. No evidence of mitral valve stenosis. Tricuspid Valve: The tricuspid valve is normal in structure. Tricuspid valve regurgitation is moderate . No evidence of tricuspid stenosis. Aortic Valve: The aortic valve is normal in structure. Aortic valve regurgitation is not visualized. No aortic stenosis is present. Pulmonic Valve: The pulmonic valve was normal in structure. Pulmonic valve regurgitation is not visualized. No evidence of pulmonic stenosis. Aorta: The  aortic root is normal in size and structure. Venous: The inferior vena cava is normal in size with greater than 50% respiratory variability, suggesting right atrial pressure of 3 mmHg. IAS/Shunts: No atrial level shunt detected by color flow Doppler.  LEFT VENTRICLE PLAX 2D LVIDd:         3.90 cm  Diastology LVIDs:         2.80 cm  LV e' lateral:   5.00 cm/s LV PW:         1.40 cm  LV E/e' lateral: 13.0 LV IVS:        1.40 cm  LV e' medial:    4.57 cm/s LVOT diam:     2.10 cm  LV E/e' medial:  14.2 LV SV:         69 LV SV Index:   34 LVOT Area:     3.46 cm  RIGHT VENTRICLE RV Basal diam:  2.90 cm RV S prime:     5.11 cm/s TAPSE (M-mode): 2.6 cm LEFT ATRIUM             Index       RIGHT ATRIUM           Index LA diam:        3.50 cm 1.72 cm/m  RA Area:     14.70 cm LA Vol (A2C):   45.9 ml 22.49 ml/m RA Volume:   36.70 ml  17.98 ml/m LA Vol (  A4C):   46.3 ml 22.69 ml/m LA Biplane Vol: 46.4 ml 22.74 ml/m  AORTIC VALVE LVOT Vmax:   91.70 cm/s LVOT Vmean:  64.300 cm/s LVOT VTI:    0.198 m  AORTA Ao Root diam: 2.80 cm MITRAL VALVE               TRICUSPID VALVE MV Area (PHT): 3.85 cm    TR Peak grad:   30.0 mmHg MV Decel Time: 197 msec    TR Vmax:        274.00 cm/s MV E velocity: 65.10 cm/s MV A velocity: 63.40 cm/s  SHUNTS MV E/A ratio:  1.03        Systemic VTI:  0.20 m                            Systemic Diam: 2.10 cm Candee Furbish MD Electronically signed by Candee Furbish MD Signature Date/Time: 10/31/2019/1:58:09 PM    Final     ECG & Cardiac Imaging    RSR, 84, deep TWI in I, II, aVL, V2-V6.   - personally reviewed.  Assessment & Plan    1.  NSTEMI:  Pt w/ h/o HTN, obesity, and DM, who has been experiencing intermittent chest pain since 6/3, which worsened today prompting call to EMS.  ECG markedly abnormal with deep T wave inversion in anterolateral leads, which is new.  Chest pain has since resolved.  Initial high-sensitivity troponin was elevated at 200.  Subsequent 148.  She is on heparin.  I will add  aspirin, high potency statin, and beta-blocker therapy.  Admit to progressive care.  Follow-up echocardiogram.  Plan diagnostic catheterization on Monday.  2.  Essential hypertension: Blood pressure elevated since arrival.  Continue home dose of valsartan-HCTZ.  Adding beta-blocker in the setting of acute coronary syndrome.  3.  Lipid status: Currently unknown.  Adding high potency statin.  Follow-up lipids and LFTs.  4.  Type 2 diabetes mellitus: We will + scale insulin.  Hold home dose of Metformin.  5.  Hypokalemia: Supplement.   Pixie Casino 11/02/2019, 7:56 AM  Attestation signed by Pixie Casino, MD at 10/30/2019 9:50 PM  Pt. Seen and examined. Agree with the Resident/NP/PA-C note as written.  This is a 44 year old female with a history of anemia, type 2 diabetes, morbid obesity and hypertension who presented to [facility] with chest pain.  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  She said over the past couple days she had been experiencing some substernal chest pressure that was intermittent throughout the day.  She took some Aleve and went to bed.  The next day she had a return of symptoms when she was in class and decided to call EMS.  She was taken to med Center and her EKG demonstrated new deep T wave inversions in the anteroseptal and inferior leads, compared to a prior EKG in March 2021.  Initial high-sensitivity troponin was elevated at 200 and the repeat was 148.  She was started on heparin and is currently chest pain-free.  On exam she is pleasant, no acute distress, morbidly obese, lungs clear, heart regular rate and rhythm, no murmur, abdomen soft, nontender, bowel sounds present, extremities without edema, awake and oriented x3, grossly nonfocal findings, psych: Pleasant.  Ms. Mliss Fritz is a pleasant 44 year old female with substernal chest pain and elevated troponin consistent with non-STEMI.  Her pain is  now resolved on IV  heparin.  I would ultimately recommend left heart catheterization for her which likely will be on Monday.  Agree with usual NSTEMI care as per Ignacia Bayley, NP's note.  Check echocardiogram.  Agree with high potency statin.  We will hold hold dose Metformin and use sliding scale insulin.  Full code.  Time Spent with Patient: I have spent a total of 45 minutes with patient reviewing hospital notes, telemetry, EKGs, labs and examining the patient as well as establishing an assessment and plan that was discussed with the patient.>50% of time was spent in direct patient care.  Pixie Casino, MD, [facility], [place] Director of the Advanced Lipid Disorders &  Cardiovascular Risk Reduction Clinic Diplomate of the American Board of Clinical Lipidology Attending Cardiologist  Direct Dial: [phone]  Fax: [phone]  Website:  [email or web address]

## 2019-11-03 ENCOUNTER — Telehealth: Payer: Self-pay

## 2019-11-03 ENCOUNTER — Telehealth: Payer: Self-pay | Admitting: Physician Assistant

## 2019-11-03 NOTE — Telephone Encounter (Signed)
Called and spoke with pt, she states she needs a note sent to he school for excusing her absence due to being in the hospital for a procedure. 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  Notified this message would be sent to Dr.Hilty and his primary nurse for review. No other questions from the pt at this time.  Fax number for her school listed in previous message

## 2019-11-03 NOTE — Telephone Encounter (Signed)
Transition Care Management Follow-up Telephone Call Date of discharge and from where: 06/07//2021 [facility]  Called pt at [phone] [facility] Phone)  [phone] (Mobile) unable to reach/ Left voice message to call back. Name and phone nr provided.

## 2019-11-03 NOTE — Telephone Encounter (Signed)
Patient contacted regarding discharge from [facility] on  11/02/19.  Patient understands to follow up with provider  HAO MENG PA on  11/11/19 at 2:15 PM  at [facility] Patient understands discharge instructions? YES Patient understands medications and regiment? REVIEWED MED LIST WITH PT FROM DISCHARGE SUMMARY  Patient understands to bring all medications to this visit? YES

## 2019-11-03 NOTE — Telephone Encounter (Signed)
New Message   Pt is requesting a Dr Note to be sent to her school  Fax number is [phone]   Please advise

## 2019-11-04 ENCOUNTER — Telehealth: Payer: Self-pay

## 2019-11-04 NOTE — Telephone Encounter (Signed)
Transition Care Management Follow-up Telephone Call  Date of discharge and from where: 11/02/2019, [facility]   How have you been since you were released from the hospital? She said she is doing okay, taking it easy and trying to rest  Any questions or concerns?  none at this time   Items Reviewed:  Did the pt receive and understand the discharge instructions provided? yes she has the instructions and did not have any questions.   Medications obtained and verified? she said that she has all medications including the new meds. She did not have any questions about the med regime  Any new allergies since your discharge?  none reported   Do you have support at home? yes  Other (ie: DME, Home Health, etc) no home health or DME ordered.   Has glucometer - said her blood sugars have been " okay."   Referred to cardiac rehab  Functional Questionnaire: (I = Independent and D = Dependent) ADL's:independent   Follow up appointments reviewed:    PCP Hospital f/u appt confirmed?  Appointment with [facility] walk in provider - 11/19/2019 @ 1610  Specialist Hospital f/u appt confirmed? cardiology - 11/11/2019  Are transportation arrangements needed?  no  If their condition worsens, is the pt aware to call  their PCP or go to the ED? yes  Was the patient provided with contact information for the PCP's office or ED? she has the clinic phone number  Was the pt encouraged to call back with questions or concerns?  yes

## 2019-11-04 NOTE — Telephone Encounter (Signed)
Please provide work excuse for the dates of hospitalization - can return to work asap. No stent.  Dr Rexene Edison

## 2019-11-04 NOTE — Telephone Encounter (Signed)
Letter composed. Patient aware it is ready for pick up

## 2019-11-11 ENCOUNTER — Other Ambulatory Visit: Payer: Self-pay

## 2019-11-11 ENCOUNTER — Ambulatory Visit (INDEPENDENT_AMBULATORY_CARE_PROVIDER_SITE_OTHER): Payer: Self-pay | Admitting: Physician Assistant

## 2019-11-11 VITALS — BP 100/72 | HR 76 | Temp 97.2°F | Ht 62.0 in | Wt 227.6 lb

## 2019-11-11 DIAGNOSIS — E785 Hyperlipidemia, unspecified: Secondary | ICD-10-CM

## 2019-11-11 DIAGNOSIS — Z79899 Other long term (current) drug therapy: Secondary | ICD-10-CM

## 2019-11-11 DIAGNOSIS — I2510 Atherosclerotic heart disease of native coronary artery without angina pectoris: Secondary | ICD-10-CM

## 2019-11-11 DIAGNOSIS — I10 Essential (primary) hypertension: Secondary | ICD-10-CM

## 2019-11-11 DIAGNOSIS — E119 Type 2 diabetes mellitus without complications: Secondary | ICD-10-CM

## 2019-11-11 NOTE — Patient Instructions (Signed)
Medication Instructions:  The current medical regimen is effective;  continue present plan and medications as directed. Please refer to the Current Medication list given to you today. *If you need a refill on your cardiac medications before your next appointment, please call your pharmacy*  Lab Work: FASTING LIPID AND LFT IN 4-6 WEEKS If you have labs (blood work) drawn today and your tests are completely normal, you will receive your results only by:  MyChart Message (if you have MyChart) OR A paper copy in the mail.  If you have any lab test that is abnormal or we need to change your treatment, we will call you to review the results. You may go to any [facility] that is convenient for you however, we do have a lab in our office that is able to assist you. You DO NOT need an appointment for our lab. The lab is open 8:00am and closes at 4:00pm. Lunch 12:45 - 1:45pm.  Follow-Up: Your next appointment:  3 month(s)  In Person with K. [place] Hilty, MD  At [facility], you and your health needs are our priority.  As part of our continuing mission to provide you with exceptional heart care, we have created designated Provider Care Teams.  These Care Teams include your primary Cardiologist (physician) and Advanced Practice Providers (APPs -  Physician Assistants and Nurse Practitioners) who all work together to provide you with the care you need, when you need it.  We recommend signing up for the patient portal called "MyChart".  Sign up information is provided on this After Visit Summary.  MyChart is used to connect with patients for Virtual Visits (Telemedicine).  Patients are able to view lab/test results, encounter notes, upcoming appointments, etc.  Non-urgent messages can be sent to your provider as well.   To learn more about what you can do with MyChart, go to [email or web address].

## 2019-11-11 NOTE — Progress Notes (Signed)
Cardiology Office Note:    Date:  11/13/2019   ID:  Amy [name], DOB 1975/07/24, MRN [id number]  PCP:  Antony Blackbird, MD  [facility] Cardiologist:  Pixie Casino, MD  [facility] Electrophysiologist:  None   Referring MD: Antony Blackbird, MD   Chief Complaint  Patient presents with  . Follow-up    seen for Dr. Debara Pickett.    History of Present Illness:    Amy [name] is a 44 y.o. female with a hx of anemia, HTN, HLD, DM II, obesity and recently diagnosed nonobstructive CAD.  Patient was recently admitted on 10/30/2019 with NSTEMI.  EKG showed deep T wave inversion in lead I, II, aVL, V2-V6.  She was hypertensive on arrival at [facility].  High-sensitivity troponin was 200 on arrival and began to trend down slowly after that.  Echocardiogram obtained on 10/31/2019 showed EF 65 to 70%, grade 1 DD, moderate TR.  She eventually underwent cardiac catheterization on 11/02/2019 which showed only 50% ramus lesion, there was catheter induced vasospasm in the small intermediate branch.  Medical therapy was recommended.  During this hospitalization, patient was placed on aspirin, statin and beta-blocker along with home valsartan-HCTZ.  Patient presents today for post hospital follow-up.  She continues to have sharp pain in her right radial area.  On physical exam, she has 2+ radial pulses distally.  She does have a tiny hematoma around the area which is likely causing her wrist pain.  Otherwise she denies any chest pain or shortness of breath.  I recommend she obtain a fasting lipid panel and LFT in 4 to 6 weeks before follow-up with Dr. Debara Pickett in 3 months.  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    Past Medical History:  Diagnosis Date  . Anemia   . Diabetes mellitus without complication ([place])   . Hypertension     Past Surgical  History:  Procedure Laterality Date  . CHOLECYSTECTOMY  2006  . DILITATION & CURRETTAGE/HYSTROSCOPY WITH HYDROTHERMAL ABLATION N/A 07/10/2013   Procedure: DILATATION & CURETTAGE/HYSTEROSCOPY WITH HYDROTHERMAL ABLATION;  Surgeon: Shelly Bombard, MD;  Location: [facility];  Service: Gynecology;  Laterality: N/A;  . LEFT HEART CATH AND CORONARY ANGIOGRAPHY N/A 11/02/2019   Procedure: LEFT HEART CATH AND CORONARY ANGIOGRAPHY;  Surgeon: Sherren Mocha, MD;  Location: [facility] [facility];  Service: Cardiovascular;  Laterality: N/A;  . TUBAL LIGATION  1997  . WISDOM TOOTH EXTRACTION  1998    Current Medications: Current Meds  Medication Sig  . aspirin 81 MG EC tablet Take 1 tablet (81 mg total) by mouth daily.  Marland Kitchen atorvastatin (LIPITOR) 80 MG tablet Take 1 tablet (80 mg total) by mouth daily.  . Blood Glucose Monitoring Suppl (TRUE METRIX METER) w/Device KIT Use to check blood sugars twice per day (Patient taking differently: 1 each by Other route See admin instructions. Use to check blood sugars twice per day)  . carvedilol (COREG) 6.25 MG tablet Take 1 tablet (6.25 mg total) by mouth 2 (two) times daily with a meal.  . fexofenadine-pseudoephedrine (ALLEGRA-D 24) 180-240 MG 24 hr tablet Take 1 tablet by mouth daily.  Marland Kitchen glucose blood (TRUE METRIX BLOOD GLUCOSE TEST) test strip Use as instructed to check BS twice per day (Patient taking differently: 1 each by Other route See admin instructions. Use as instructed  to check BS twice per day)  . ibuprofen (ADVIL) 600 MG tablet Take 1 tablet (600 mg total) by mouth every 6 (six) hours as needed.  Marland Kitchen ibuprofen (ADVIL) 600 MG tablet Take 1 tablet (600 mg total) by mouth every 6 (six) hours as needed. (Patient taking differently: Take 600 mg by mouth every 6 (six) hours as needed for mild pain. )  . metFORMIN (GLUCOPHAGE-XR) 500 MG 24 hr tablet Take 2 tablets (1,000 mg total) by mouth daily after supper.  . Multiple Vitamin (MULTIVITAMIN WITH MINERALS) TABS tablet  Take 1 tablet by mouth daily.  . nitroGLYCERIN (NITROSTAT) 0.4 MG SL tablet Place 1 tablet (0.4 mg total) under the tongue every 5 (five) minutes x 3 doses as needed for chest pain.  Marland Kitchen ondansetron (ZOFRAN ODT) 4 MG disintegrating tablet 21m ODT q4 hours prn nausea/vomit (Patient taking differently: Take 4 mg by mouth every 4 (four) hours as needed for nausea or vomiting. )  . TRUEplus Lancets 28G MISC Use when checking BS twice per day (Patient taking differently: 1 each by Other route See admin instructions. Use when checking BS twice per day)  . valsartan-hydrochlorothiazide (DIOVAN-HCT) 80-12.5 MG tablet Take 1 tablet by mouth daily.     Allergies:   Amlodipine, Hydrocodone-acetaminophen, Losartan potassium-hctz, and Tetanus toxoids   Social History   Socioeconomic History  . Marital status: Married    Spouse name: MJerald Kief . Number of children: 2  . Years of education: Not on file  . Highest education level: Some college, no degree  Occupational History  . 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 Smoking status: Never Smoker  . Smokeless tobacco: Never Used  Vaping Use  . Vaping Use: Never used  Substance and Sexual Activity  . Alcohol use: No  . Drug use: No  . Sexual activity: Not on file  Other Topics Concern  . Not on file  Social History Narrative   Lives locally.  Does not routinely exercise.  In school @ [employer] for nsg.   Social Determinants of Health   Financial Resource Strain:   . Difficulty of Paying Living Expenses:   Food Insecurity:   . Worried About RCharity fundraiserin the Last Year:   . RArboriculturistin the Last Year:   Transportation Needs:   . LFilm/video editor(Medical):   .Marland KitchenLack of Transportation (Non-Medical):   Physical Activity:   . Days of Exercise per Week:   . Minutes of Exercise per Session:   Stress:   . Feeling of Stress :   Social Connections:   . Frequency of Communication  with Friends and Family:   . Frequency of Social Gatherings with Friends and Family:   . Attends Religious Services:   . Active Member of Clubs or Organizations:   . Attends CArchivistMeetings:   .Marland KitchenMarital Status:      Family History: The patient's family history includes Breast cancer in her mother; COPD in an other family member; Cancer in an other family member; Diabetes in an other family member; Heart failure in her maternal aunt; Hypertension in her mother and another family member.  ROS:   Please see the history of present illness.     All other systems reviewed and are negative.  EKGs/Labs/Other Studies Reviewed:    The following studies were reviewed today:  Echo 10/31/2019 1. Left ventricular ejection fraction, by estimation,  is 65 to 70%. The left ventricle has normal function. The left ventricle has no regional wall motion abnormalities. Left ventricular diastolic parameters are consistent with Grade I diastolic dysfunction (impaired relaxation).  2. Right ventricular systolic function is normal. The right ventricular size is normal. There is normal pulmonary artery systolic pressure.  3. The mitral valve is normal in structure. No evidence of mitral valve regurgitation. No evidence of mitral stenosis.  4. Tricuspid valve regurgitation is moderate.  5. The aortic valve is normal in structure. Aortic valve regurgitation is not visualized. No aortic stenosis is present.  6. The inferior vena cava is normal in size with greater than 50%  respiratory variability, suggesting right atrial pressure of 3 mmHg.    Cath 11/02/2019  Ramus lesion is 50% stenosed.   Patent coronary arteries with no evidence of obstructive CAD, with the exception of catheter-induced vasospasm in a small intermediate branch  Recommend: medical therapy   EKG:  EKG is not ordered today.   Recent Labs: 10/31/2019: ALT 14 11/02/2019: BUN 12; Creatinine, Ser 0.87; Hemoglobin 11.4; Platelets  316; Potassium 3.7; Sodium 136  Recent Lipid Panel    Component Value Date/Time   CHOL 139 11/01/2019 0726   TRIG 117 11/01/2019 0726   HDL 34 (L) 11/01/2019 0726   CHOLHDL 4.1 11/01/2019 0726   VLDL 23 11/01/2019 0726   LDLCALC 82 11/01/2019 0726    Physical Exam:    VS:  BP 100/72 (BP Location: Left Arm, Patient Position: Sitting, Cuff Size: Large)   Pulse 76   Temp (!) 97.2 F (36.2 C)   Ht _0  (1.575 m)   Wt 227 lb 9.6 oz (103.2 kg)   BMI 41.63 kg/m     Wt Readings from Last 3 Encounters:  11/11/19 227 lb 9.6 oz (103.2 kg)  10/30/19 233 lb 4 oz (105.8 kg)  10/16/19 232 lb (105.2 kg)     GEN:  Well nourished, well developed in no acute distress HEENT: Normal NECK: No JVD; No carotid bruits LYMPHATICS: No lymphadenopathy CARDIAC: RRR, no murmurs, rubs, gallops RESPIRATORY:  Clear to auscultation without rales, wheezing or rhonchi  ABDOMEN: Soft, non-tender, non-distended MUSCULOSKELETAL:  No edema; No deformity  SKIN: Warm and dry NEUROLOGIC:  Alert and oriented x 3 PSYCHIATRIC:  Normal affect   ASSESSMENT:    1. Coronary artery disease involving native coronary artery of native heart without angina pectoris   2. Medication management   3. Hyperlipidemia, unspecified hyperlipidemia type   4. Essential hypertension   5. Controlled type 2 diabetes mellitus without complication, without long-term current use of insulin (HCC)    PLAN:    In order of problems listed above:  1. CAD: Recent cardiac catheterization showed minimal disease, however had a catheter induced spasm.  Continue aspirin, statin and carvedilol  2. Hyperlipidemia: Continue Lipitor.  Fasting lipid panel and LFT in 4 to 6 weeks  3. Hypertension: Blood pressure stable  4. DM2: Managed by primary care provider.   Medication Adjustments/Labs and Tests Ordered: Current medicines are reviewed at length with the patient today.  Concerns regarding medicines are outlined above.  Orders Placed  This Encounter  Procedures  . Lipid panel  . Hepatic function panel   No orders of the defined types were placed in this encounter.   Patient Instructions  Medication Instructions:  The current medical regimen is effective;  continue present plan and medications as directed. Please refer to the Current Medication list given to you today. *If  you need a refill on your cardiac medications before your next appointment, please call your pharmacy*  Lab Work: FASTING LIPID AND LFT IN 4-6 WEEKS If you have labs (blood work) drawn today and your tests are completely normal, you will receive your results only by:  [place] (if you have MyChart) OR A paper copy in the mail.  If you have any lab test that is abnormal or we need to change your treatment, we will call you to review the results. You may go to any [facility] that is convenient for you however, we do have a lab in our office that is able to assist you. You DO NOT need an appointment for our lab. The lab is open 8:00am and closes at 4:00pm. Lunch 12:45 - 1:45pm.  Follow-Up: Your next appointment:  3 month(s)  In Person with K. Mali Hilty, MD  At [facility], you and your health needs are our priority.  As part of our continuing mission to provide you with exceptional heart care, we have created designated Provider Care Teams.  These Care Teams include your primary Cardiologist (physician) and Advanced Practice Providers (APPs -  Physician Assistants and Nurse Practitioners) who all work together to provide you with the care you need, when you need it.  We recommend signing up for the patient portal called "MyChart".  Sign up information is provided on this After Visit Summary.  MyChart is used to connect with patients for Virtual Visits (Telemedicine).  Patients are able to view lab/test results, encounter notes, upcoming appointments, etc.  Non-urgent messages can be sent to your provider as well.   To learn more about what you can do  with MyChart, go to [email or web address].       Hilbert Corrigan, [place]  11/13/2019 10:16 PM    [facility]

## 2019-11-13 ENCOUNTER — Encounter: Payer: Self-pay | Admitting: Physician Assistant

## 2019-11-18 ENCOUNTER — Other Ambulatory Visit: Payer: Self-pay

## 2019-11-18 ENCOUNTER — Encounter (HOSPITAL_BASED_OUTPATIENT_CLINIC_OR_DEPARTMENT_OTHER): Payer: Self-pay | Admitting: *Deleted

## 2019-11-18 ENCOUNTER — Emergency Department (HOSPITAL_BASED_OUTPATIENT_CLINIC_OR_DEPARTMENT_OTHER)
Admission: EM | Admit: 2019-11-18 | Discharge: 2019-11-18 | Disposition: A | Discharge status: Home / Self Care | Payer: Self-pay | Attending: Emergency Medicine | Admitting: Emergency Medicine

## 2019-11-18 DIAGNOSIS — Z794 Long term (current) use of insulin: Secondary | ICD-10-CM | POA: Insufficient documentation

## 2019-11-18 DIAGNOSIS — I10 Essential (primary) hypertension: Secondary | ICD-10-CM | POA: Insufficient documentation

## 2019-11-18 DIAGNOSIS — Z955 Presence of coronary angioplasty implant and graft: Secondary | ICD-10-CM | POA: Insufficient documentation

## 2019-11-18 DIAGNOSIS — Z7982 Long term (current) use of aspirin: Secondary | ICD-10-CM | POA: Insufficient documentation

## 2019-11-18 DIAGNOSIS — Z8679 Personal history of other diseases of the circulatory system: Secondary | ICD-10-CM | POA: Insufficient documentation

## 2019-11-18 DIAGNOSIS — E119 Type 2 diabetes mellitus without complications: Secondary | ICD-10-CM | POA: Insufficient documentation

## 2019-11-18 DIAGNOSIS — M25531 Pain in right wrist: Secondary | ICD-10-CM | POA: Insufficient documentation

## 2019-11-18 HISTORY — DX: Atherosclerotic heart disease of native coronary artery without angina pectoris: I25.10

## 2019-11-18 NOTE — Progress Notes (Signed)
Patient ID: Amy [name], female   DOB: Feb 01, 1976, 44 y.o.   MRN: [id number] Virtual Visit via Telephone Note  I connected with Amy [name] on 11/19/19 at  4:10 PM EDT by telephone and verified that I am speaking with the correct person using two identifiers.   I discussed the limitations, risks, security and privacy concerns of performing an evaluation and management service by telephone and the availability of in person appointments. I also discussed with the patient that there may be a patient responsible charge related to this service. The patient expressed understanding and agreed to proceed.  PATIENT visit by telephone virtually in the context of Covid-19 pandemic. Patient location:  home My Location:  [facility] office Persons on the call:  Me and the patient   History of Present Illness: After hospitalization 6/4-6/7 for NSTEMI.  No further CP.  She has been having wrist pain at the site of catheterization and went to the ED yesterday.  They gave her a splint to sleep in.  Blood sugars running <130.  No N.  No jaw or radiating pain.  No SOB.  No CP/HA.    From discharge summary: 1. NSTEMI - Hs troponin 200>>148>>122>>106 - Echo showed LVEF of 65-70%, no WM abnormality, grade 1 DD.  - Cath showed mild non obstructive CAD with 50% RI. There was catheter induced vasospasm in RI. Recommended medical therapy.  - Continue ASA, statin and BB (antihypertensive and antianginal).  - No recurrent chest pain.    2. HTN - BP was elevated. Started Coreg and up-titrated.  - Continued home Varsartan equivalent and HCTZ - BP improved  3. HLD - 11/01/2019: Cholesterol 139; HDL 34; LDL Cholesterol 82; Triglycerides 117; VLDL 23  - continue statin  - Consider OP f/u labs 6-8 weeks given statin initiation this admission.  4. DM - Treated with SSI while admitted  - Resumed home Metformin (hold 48 hours post cath)  Did the patient have an acute coronary syndrome (MI, NSTEMI,  STEMI, etc) this admission?:  Yes                               AHA/ACC Clinical Performance & Quality Measures: 1. Aspirin prescribed? - Yes 2. ADP Receptor Inhibitor (Plavix/Clopidogrel, Brilinta/Ticagrelor or Effient/Prasugrel) prescribed (includes medically managed patients)? - No - No PCI - medical therapy 3. Beta Blocker prescribed? - Yes 4. High Intensity Statin (Lipitor 40-80mg  or Crestor 20-40mg ) prescribed? - Yes 5. EF assessed during THIS hospitalization? - Yes 6. For EF <40%, was ACEI/ARB prescribed? - Not Applicable (EF >/= 65%) Already on ARB (Valsartan) 7. For EF <40%, Aldosterone Antagonist (Spironolactone or Eplerenone) prescribed? - Not Applicable (EF >/= 03%) 8. Cardiac Rehab Phase II ordered (including medically managed patients)? - No - NO PCI     Observations/Objective: NAD.  A&Ox3  Assessment and Plan: 1. NSTEMI (non-ST elevated myocardial infarction) [facility]) Seen by cardiology 6/16-see above summary of A/P.  Call 911 if any CP given history.  Patient verbalizes understanding!  2. Type 2 diabetes mellitus without complication, without long-term current use of insulin (HCC) Currently controlled.  Continue current regimen and follow diabetic diet  3. Essential hypertension Check BP OOO 3 to 5 times weekly and record and have availabale at next visit.  Continue current regimen  4. Hospital discharge follow-up Doing well   Follow Up Instructions: See PCP as scheduled in early August   I discussed the assessment and treatment plan  with the patient. The patient was provided an opportunity to ask questions and all were answered. The patient agreed with the plan and demonstrated an understanding of the instructions.   The patient was advised to call back or seek an in-person evaluation if the symptoms worsen or if the condition fails to improve as anticipated.  I provided 15 minutes of non-face-to-face time during this encounter.   Georgian Co, PA-C

## 2019-11-18 NOTE — ED Provider Notes (Signed)
[facility] EMERGENCY DEPARTMENT Provider Note   CSN: [id number] Arrival date & time: 11/18/19  1535     History Chief Complaint  Patient presents with  . Hand Injury    Amy [name] is a 44 y.o. female with a past medical history of left heart cath through the right radial artery on 11/02/2019 who presents today for evaluation of continued pain in her right wrist.  She reports that since about a day after the procedure she has had pain there.  She saw cardiology in the office on 6/16 where at that point it was felt to be a hematoma that was small, resolving with no specific treatment indicated.  She states that she has been trying Goody's powders, Tylenol, ibuprofen and ice without relief.  She states occasionally she feels the pain shoot up into her arm.  She denies any weakness numbness or tingling.  No swelling in the arm.  HPI     Past Medical History:  Diagnosis Date  . Anemia   . Coronary artery disease   . Diabetes mellitus without complication ([place])   . Hypertension     Patient Active Problem List   Diagnosis Date Noted  . Acute coronary syndrome ([place])   . NSTEMI (non-ST elevated myocardial infarction) ([place]) 10/30/2019  . Excessive or frequent menstruation 09/10/2012    Past Surgical History:  Procedure Laterality Date  . CHOLECYSTECTOMY  2006  . CORONARY ANGIOPLASTY    . DILITATION & CURRETTAGE/HYSTROSCOPY WITH HYDROTHERMAL ABLATION N/A 07/10/2013   Procedure: DILATATION & CURETTAGE/HYSTEROSCOPY WITH HYDROTHERMAL ABLATION;  Surgeon: Shelly Bombard, MD;  Location: [facility];  Service: Gynecology;  Laterality: N/A;  . LEFT HEART CATH AND CORONARY ANGIOGRAPHY N/A 11/02/2019   Procedure: LEFT HEART CATH AND CORONARY ANGIOGRAPHY;  Surgeon: Sherren Mocha, MD;  Location: [facility];  Service: Cardiovascular;  Laterality: N/A;  . TUBAL LIGATION  1997  . WISDOM TOOTH EXTRACTION  1998     OB History    Gravida  2   Para  2   Term  2    Preterm      AB      Living  2     SAB      TAB      Ectopic      Multiple      Live Births  2           Family History  Problem Relation Age of Onset  . Breast cancer Mother   . Hypertension Mother   . COPD Other   . Hypertension Other   . Diabetes Other   . Cancer Other   . Heart failure Maternal Aunt     Social History   Tobacco Use  . Smoking status: Never Smoker  . Smokeless tobacco: Never Used  Vaping Use  . Vaping Use: Never used  Substance Use Topics  . Alcohol use: No  . Drug use: No    Home Medications Prior to Admission medications   Medication Sig Start Date End Date Taking? Authorizing Provider  aspirin 81 MG EC tablet Take 1 tablet (81 mg total) by mouth daily. 11/03/19   Bhagat, Crista Luria, PA  atorvastatin (LIPITOR) 80 MG tablet Take 1 tablet (80 mg total) by mouth daily. 11/02/19   Bhagat, Crista Luria, PA  Blood Glucose Monitoring Suppl (TRUE METRIX METER) w/Device KIT Use to check blood sugars twice per day Patient taking differently: 1 each by Other route See admin instructions. Use to check blood  sugars twice per day 08/20/19   Antony Blackbird, MD  carvedilol (COREG) 6.25 MG tablet Take 1 tablet (6.25 mg total) by mouth 2 (two) times daily with a meal. 11/02/19   Bhagat, Bhavinkumar, PA  fexofenadine-pseudoephedrine (ALLEGRA-D 24) 180-240 MG 24 hr tablet Take 1 tablet by mouth daily.    [provider]  glucose blood (TRUE METRIX BLOOD GLUCOSE TEST) test strip Use as instructed to check BS twice per day Patient taking differently: 1 each by Other route See admin instructions. Use as instructed to check BS twice per day 08/20/19   Fulp, Ander Gaster, MD  ibuprofen (ADVIL) 600 MG tablet Take 1 tablet (600 mg total) by mouth every 6 (six) hours as needed. 07/31/19   Isla Pence, MD  ibuprofen (ADVIL) 600 MG tablet Take 1 tablet (600 mg total) by mouth every 6 (six) hours as needed. Patient taking differently: Take 600 mg by mouth every 6 (six)  hours as needed for mild pain.  10/16/19   Suzy Bouchard, PA-C  metFORMIN (GLUCOPHAGE-XR) 500 MG 24 hr tablet Take 2 tablets (1,000 mg total) by mouth daily after supper. 09/11/19   Fulp, Cammie, MD  Multiple Vitamin (MULTIVITAMIN WITH MINERALS) TABS tablet Take 1 tablet by mouth daily.    [provider]  nitroGLYCERIN (NITROSTAT) 0.4 MG SL tablet Place 1 tablet (0.4 mg total) under the tongue every 5 (five) minutes x 3 doses as needed for chest pain. 11/02/19   Bhagat, Bhavinkumar, PA  ondansetron (ZOFRAN ODT) 4 MG disintegrating tablet 31m ODT q4 hours prn nausea/vomit Patient taking differently: Take 4 mg by mouth every 4 (four) hours as needed for nausea or vomiting.  09/29/19   ZMilton Ferguson MD  TRUEplus Lancets 28G MISC Use when checking BS twice per day Patient taking differently: 1 each by Other route See admin instructions. Use when checking BS twice per day 08/20/19   Fulp, Cammie, MD  valsartan-hydrochlorothiazide (DIOVAN-HCT) 80-12.5 MG tablet Take 1 tablet by mouth daily. 10/14/19   Fulp, Cammie, MD    Allergies    Amlodipine, Hydrocodone-acetaminophen, Losartan potassium-hctz, and Tetanus toxoids  Review of Systems   Review of Systems  Constitutional: Negative for chills and fever.  Musculoskeletal:       Pain in right wrist  All other systems reviewed and are negative.   Physical Exam Updated Vital Signs BP (!) 132/95 (BP Location: Left Arm)   Pulse 85   Temp 98.5 F (36.9 C) (Oral)   Resp 18   Ht 5' 2"  (1.575 m)   Wt 103 kg   SpO2 100%   BMI 41.52 kg/m   Physical Exam Vitals and nursing note reviewed.  Constitutional:      General: She is not in acute distress.    Appearance: She is not ill-appearing.  HENT:     Head: Normocephalic.  Cardiovascular:     Rate and Rhythm: Normal rate.     Comments: 2+ right radial pulse.  Brisk capillary refill under 2 seconds to fingers on right hand.  Right hand is appropriately warm, right hand color is  consistent with color on left hand. Pulmonary:     Effort: Pulmonary effort is normal. No respiratory distress.  Musculoskeletal:     Comments: No significant edema of the right arm.  There is no pain or tenderness to palpation over the right forearm.  5/5 right grip strength.  Skin:    Comments: No abnormal erythema, ecchymosis or induration of right wrist/forearm.  Neurological:  Mental Status: She is alert.     Sensory: No sensory deficit (Sensation intact to light touch to right hand.).     ED Results / Procedures / Treatments   Labs (all labs ordered are listed, but only abnormal results are displayed) Labs Reviewed - No data to display  EKG None  Radiology No results found.  Procedures Procedures (including critical care time)  Medications Ordered in ED Medications - No data to display  ED Course  I have reviewed the triage vital signs and the nursing notes.  Pertinent labs & imaging results that were available during my care of the patient were reviewed by me and considered in my medical decision making (see chart for details).    MDM Rules/Calculators/A&P                          Patient is a 44 year old woman who presents today for evaluation of over 2 weeks of continued pain in the right wrist after she had a cath.  She has been seen by cardiology since, it was felt to be a benign hematoma.  She denies any pain changes since.  On my exam overall unremarkable.  She does not have any significant arm swelling, do not suspect DVT.  There is intact 2+ radial pulse with brisk capillary refill, sensation and motor function is intact.  Given that she is neurovascularly intact, and was seen and evaluated by cardiology for this issue a week ago no indication for additional evaluation at this time.  I will give her a wrist splint to wear at night to help with any extended wrist flexion that she may be doing in her sleep.  Return precautions were discussed with patient who  states their understanding.  At the time of discharge patient denied any unaddressed complaints or concerns.  Patient is agreeable for discharge home.  Note: Portions of this report may have been transcribed using voice recognition software. Every effort was made to ensure accuracy; however, inadvertent computerized transcription errors may be present  Final Clinical Impression(s) / ED Diagnoses Final diagnoses:  Right wrist pain    Rx / DC Orders ED Discharge Orders    None       Lorin Glass, PA-C 11/18/19 1657    Drenda Freeze, MD 11/18/19 2322

## 2019-11-18 NOTE — ED Triage Notes (Signed)
C/o right wrist and hand pain / tingling since heart cath

## 2019-11-18 NOTE — Discharge Instructions (Signed)
Please only wear the wrist brace at night.

## 2019-11-19 ENCOUNTER — Ambulatory Visit: Payer: Self-pay | Attending: Family Medicine | Admitting: Physician Assistant

## 2019-11-19 DIAGNOSIS — I10 Essential (primary) hypertension: Secondary | ICD-10-CM

## 2019-11-19 DIAGNOSIS — E119 Type 2 diabetes mellitus without complications: Secondary | ICD-10-CM

## 2019-11-19 DIAGNOSIS — Z09 Encounter for follow-up examination after completed treatment for conditions other than malignant neoplasm: Secondary | ICD-10-CM

## 2019-11-19 DIAGNOSIS — I214 Non-ST elevation (NSTEMI) myocardial infarction: Secondary | ICD-10-CM

## 2019-11-27 ENCOUNTER — Telehealth: Payer: Self-pay | Admitting: Physician Assistant

## 2019-11-27 MED FILL — VALSARTAN-HCTZ 80-12.5 MG T: 80-12.5 | 30 days supply | Qty: 30 | Fill #1

## 2019-11-27 MED FILL — ATORVASTATIN 80 MG TABLET: 80 | 30 days supply | Qty: 30 | Fill #0

## 2019-11-27 MED FILL — CARVEDILOL 6.25 MG TABLET: 6.25 | 30 days supply | Qty: 60 | Fill #0

## 2019-11-27 NOTE — Telephone Encounter (Signed)
Patient called stating that she had a cath done, they went through her wrist.  She states the site still hurts.  She went to the ED yesterday and they gave her a splint to wear. She said at her last visit Azalee Course told her to call if it still hurts her that he would send her for an U/S.

## 2019-11-27 NOTE — Telephone Encounter (Signed)
Patient had radial cath on 6/7 She has had residual pain since She went to ED on 6/23 and now has wrist which she has been wearily routinely  She went to work last night and noticed sharp pain in wrist, tenderness in the area She states she struggles with writing while at work She has good ROM in fingers, no discoloration of fingers on this hand  She said that [place] PA told her to contact office if symptoms persisted Message routed to PA/CMA to advise if testing or f/u is needed

## 2019-12-01 ENCOUNTER — Other Ambulatory Visit: Payer: Self-pay

## 2019-12-01 DIAGNOSIS — M25531 Pain in right wrist: Secondary | ICD-10-CM

## 2019-12-01 NOTE — Progress Notes (Signed)
Placed order for VAS [place] UPPER Extremity.  Called patient and made her aware that had would like for her to have an ultrasound of her wrist, that an order was placed and that someone from scheduling will be giving her a call to get her scheduled for an appointment. Patient verbalized understanding and all (if any) questions were answered.

## 2019-12-01 NOTE — Telephone Encounter (Signed)
Given persistent symptom, let's go ahead and obtain a vascular doppler of the right wrist to make sure to significant vascular complication after the recent cath.

## 2019-12-03 ENCOUNTER — Ambulatory Visit (HOSPITAL_COMMUNITY)
Admission: RE | Admit: 2019-12-03 | Discharge: 2019-12-03 | Disposition: A | Discharge status: Home / Self Care | Payer: Self-pay | Source: Ambulatory Visit | Attending: Cardiology | Admitting: Cardiology

## 2019-12-03 ENCOUNTER — Other Ambulatory Visit: Payer: Self-pay

## 2019-12-03 DIAGNOSIS — M25531 Pain in right wrist: Secondary | ICD-10-CM

## 2019-12-03 LAB — HEPATIC FUNCTION PANEL
ALT: 9 IU/L (ref 0–32)
AST: 15 IU/L (ref 0–40)
Albumin: 3.6 g/dL — ABNORMAL LOW (ref 3.8–4.8)
Alkaline Phosphatase: 103 IU/L (ref 48–121)
Bilirubin Total: <0.2 mg/dL (ref 0.0–1.2)
Bilirubin, Direct: 0.07 mg/dL (ref 0.00–0.40)
Total Protein: 7.3 g/dL (ref 6.0–8.5)

## 2019-12-03 LAB — LIPID PANEL
Chol/HDL Ratio: 2.9 ratio (ref 0.0–4.4)
Cholesterol, Total: 94 mg/dL — ABNORMAL LOW (ref 100–199)
HDL: 32 mg/dL — ABNORMAL LOW (ref >39)
LDL Chol Calc (NIH): 44 mg/dL (ref 0–99)
Triglycerides: 91 mg/dL (ref 0–149)
VLDL Cholesterol Cal: 18 mg/dL (ref 5–40)

## 2019-12-07 ENCOUNTER — Encounter: Payer: Self-pay | Admitting: *Deleted

## 2019-12-17 ENCOUNTER — Encounter (HOSPITAL_BASED_OUTPATIENT_CLINIC_OR_DEPARTMENT_OTHER): Payer: Self-pay | Admitting: Emergency Medicine

## 2019-12-17 ENCOUNTER — Other Ambulatory Visit: Payer: Self-pay

## 2019-12-17 ENCOUNTER — Emergency Department (HOSPITAL_BASED_OUTPATIENT_CLINIC_OR_DEPARTMENT_OTHER): Payer: Self-pay

## 2019-12-17 ENCOUNTER — Emergency Department (HOSPITAL_BASED_OUTPATIENT_CLINIC_OR_DEPARTMENT_OTHER)
Admission: EM | Admit: 2019-12-17 | Discharge: 2019-12-17 | Disposition: A | Discharge status: Home / Self Care | Payer: Self-pay | Attending: Emergency Medicine | Admitting: Emergency Medicine

## 2019-12-17 DIAGNOSIS — I10 Essential (primary) hypertension: Secondary | ICD-10-CM | POA: Insufficient documentation

## 2019-12-17 DIAGNOSIS — Z20822 Contact with and (suspected) exposure to covid-19: Secondary | ICD-10-CM | POA: Insufficient documentation

## 2019-12-17 DIAGNOSIS — Z7982 Long term (current) use of aspirin: Secondary | ICD-10-CM | POA: Insufficient documentation

## 2019-12-17 DIAGNOSIS — Z7984 Long term (current) use of oral hypoglycemic drugs: Secondary | ICD-10-CM | POA: Insufficient documentation

## 2019-12-17 DIAGNOSIS — J069 Acute upper respiratory infection, unspecified: Secondary | ICD-10-CM | POA: Insufficient documentation

## 2019-12-17 DIAGNOSIS — I2510 Atherosclerotic heart disease of native coronary artery without angina pectoris: Secondary | ICD-10-CM | POA: Insufficient documentation

## 2019-12-17 DIAGNOSIS — Z79899 Other long term (current) drug therapy: Secondary | ICD-10-CM | POA: Insufficient documentation

## 2019-12-17 DIAGNOSIS — E119 Type 2 diabetes mellitus without complications: Secondary | ICD-10-CM | POA: Insufficient documentation

## 2019-12-17 LAB — SARS CORONAVIRUS 2 BY RT PCR (HOSPITAL ORDER, PERFORMED IN ~~LOC~~ HOSPITAL LAB): SARS Coronavirus 2: NEGATIVE

## 2019-12-17 NOTE — ED Provider Notes (Signed)
[facility] EMERGENCY DEPARTMENT Provider Note   CSN: [id number] Arrival date & time: 12/17/19  1542     History Chief Complaint  Patient presents with  . Cough    Amy [name] is a 44 y.o. female.  Patient is a 44 year old female with a history of diabetes, hypertension and coronary artery disease.  She presents with runny nose and coughing.  She is overall felt bad and fatigue.  She really does not have myalgias.  No fevers.  She has been using over-the-counter antihistamines without improvement in symptoms.  No known sick contacts.        Past Medical History:  Diagnosis Date  . Anemia   . Coronary artery disease   . Diabetes mellitus without complication ([place])   . Hypertension     Patient Active Problem List   Diagnosis Date Noted  . Acute coronary syndrome ([place])   . NSTEMI (non-ST elevated myocardial infarction) ([place]) 10/30/2019  . Excessive or frequent menstruation 09/10/2012    Past Surgical History:  Procedure Laterality Date  . CHOLECYSTECTOMY  2006  . CORONARY ANGIOPLASTY    . DILITATION & CURRETTAGE/HYSTROSCOPY WITH HYDROTHERMAL ABLATION N/A 07/10/2013   Procedure: DILATATION & CURETTAGE/HYSTEROSCOPY WITH HYDROTHERMAL ABLATION;  Surgeon: Shelly Bombard, MD;  Location: [facility];  Service: Gynecology;  Laterality: N/A;  . LEFT HEART CATH AND CORONARY ANGIOGRAPHY N/A 11/02/2019   Procedure: LEFT HEART CATH AND CORONARY ANGIOGRAPHY;  Surgeon: Sherren Mocha, MD;  Location: [facility];  Service: Cardiovascular;  Laterality: N/A;  . TUBAL LIGATION  1997  . WISDOM TOOTH EXTRACTION  1998     OB History    Gravida  2   Para  2   Term  2   Preterm      AB      Living  2     SAB      TAB      Ectopic      Multiple      Live Births  2           Family History  Problem Relation Age of Onset  . Breast cancer Mother   . Hypertension Mother   . COPD Other   . Hypertension Other   . Diabetes Other   . Cancer  Other   . Heart failure Maternal Aunt     Social History   Tobacco Use  . Smoking status: Never Smoker  . Smokeless tobacco: Never Used  Vaping Use  . Vaping Use: Never used  Substance Use Topics  . Alcohol use: No  . Drug use: No    Home Medications Prior to Admission medications   Medication Sig Start Date End Date Taking? Authorizing Provider  aspirin 81 MG EC tablet Take 1 tablet (81 mg total) by mouth daily. 11/03/19   Bhagat, Crista Luria, PA  atorvastatin (LIPITOR) 80 MG tablet Take 1 tablet (80 mg total) by mouth daily. 11/02/19   Bhagat, Crista Luria, PA  Blood Glucose Monitoring Suppl (TRUE METRIX METER) w/Device KIT Use to check blood sugars twice per day Patient taking differently: 1 each by Other route See admin instructions. Use to check blood sugars twice per day 08/20/19   Fulp, Ander Gaster, MD  carvedilol (COREG) 6.25 MG tablet Take 1 tablet (6.25 mg total) by mouth 2 (two) times daily with a meal. 11/02/19   Bhagat, Bhavinkumar, PA  fexofenadine-pseudoephedrine (ALLEGRA-D 24) 180-240 MG 24 hr tablet Take 1 tablet by mouth daily.    [provider]  glucose blood (TRUE METRIX BLOOD GLUCOSE TEST) test strip Use as instructed to check BS twice per day Patient taking differently: 1 each by Other route See admin instructions. Use as instructed to check BS twice per day 08/20/19   Fulp, Ander Gaster, MD  ibuprofen (ADVIL) 600 MG tablet Take 1 tablet (600 mg total) by mouth every 6 (six) hours as needed. 07/31/19   Isla Pence, MD  ibuprofen (ADVIL) 600 MG tablet Take 1 tablet (600 mg total) by mouth every 6 (six) hours as needed. Patient taking differently: Take 600 mg by mouth every 6 (six) hours as needed for mild pain.  10/16/19   Suzy Bouchard, PA-C  metFORMIN (GLUCOPHAGE-XR) 500 MG 24 hr tablet Take 2 tablets (1,000 mg total) by mouth daily after supper. 09/11/19   Fulp, Cammie, MD  Multiple Vitamin (MULTIVITAMIN WITH MINERALS) TABS tablet Take 1 tablet by mouth daily.     [provider]  nitroGLYCERIN (NITROSTAT) 0.4 MG SL tablet Place 1 tablet (0.4 mg total) under the tongue every 5 (five) minutes x 3 doses as needed for chest pain. 11/02/19   Bhagat, Bhavinkumar, PA  ondansetron (ZOFRAN ODT) 4 MG disintegrating tablet 69m ODT q4 hours prn nausea/vomit Patient taking differently: Take 4 mg by mouth every 4 (four) hours as needed for nausea or vomiting.  09/29/19   ZMilton Ferguson MD  TRUEplus Lancets 28G MISC Use when checking BS twice per day Patient taking differently: 1 each by Other route See admin instructions. Use when checking BS twice per day 08/20/19   Fulp, Cammie, MD  valsartan-hydrochlorothiazide (DIOVAN-HCT) 80-12.5 MG tablet Take 1 tablet by mouth daily. 10/14/19   Fulp, Cammie, MD    Allergies    Amlodipine, Hydrocodone-acetaminophen, Losartan potassium-hctz, and Tetanus toxoids  Review of Systems   Review of Systems  Constitutional: Positive for fatigue. Negative for chills, diaphoresis and fever.  HENT: Positive for congestion, postnasal drip and rhinorrhea. Negative for sneezing.   Eyes: Negative.   Respiratory: Positive for cough. Negative for chest tightness and shortness of breath.   Cardiovascular: Negative for chest pain and leg swelling.  Gastrointestinal: Negative for abdominal pain, blood in stool, diarrhea, nausea and vomiting.  Genitourinary: Negative for difficulty urinating, flank pain, frequency and hematuria.  Musculoskeletal: Negative for arthralgias and back pain.  Skin: Negative for rash.  Neurological: Negative for dizziness, speech difficulty, weakness, numbness and headaches.    Physical Exam Updated Vital Signs BP (!) 139/48 (BP Location: Left Arm)   Pulse 79   Temp 98.2 F (36.8 C) (Oral)   Resp 16   Ht _0  (1.575 m)   Wt (!) 104.8 kg   SpO2 100%   BMI 42.27 kg/m   Physical Exam Constitutional:      Appearance: She is well-developed.  HENT:     Head: Normocephalic and atraumatic.     Right Ear:  Tympanic membrane normal.     Left Ear: Tympanic membrane normal.     Mouth/Throat:     Pharynx: No oropharyngeal exudate or posterior oropharyngeal erythema.  Eyes:     Pupils: Pupils are equal, round, and reactive to light.  Cardiovascular:     Rate and Rhythm: Normal rate and regular rhythm.     Heart sounds: Normal heart sounds.  Pulmonary:     Effort: Pulmonary effort is normal. No respiratory distress.     Breath sounds: Normal breath sounds. No wheezing or rales.  Chest:     Chest wall: No tenderness.  Abdominal:  General: Bowel sounds are normal.     Palpations: Abdomen is soft.     Tenderness: There is no abdominal tenderness. There is no guarding or rebound.  Musculoskeletal:        General: Normal range of motion.     Cervical back: Normal range of motion and neck supple.  Lymphadenopathy:     Cervical: No cervical adenopathy.  Skin:    General: Skin is warm and dry.     Findings: No rash.  Neurological:     Mental Status: She is alert and oriented to person, place, and time.     ED Results / Procedures / Treatments   Labs (all labs ordered are listed, but only abnormal results are displayed) Labs Reviewed  SARS CORONAVIRUS 2 BY RT PCR [facility] ORDER, [place] [facility])    EKG EKG Interpretation  Date/Time:  Thursday December 17 2019 16:05:04 EDT Ventricular Rate:  87 PR Interval:  152 QRS Duration: 88 QT Interval:  352 QTC Calculation: 423 R Axis:   69 Text Interpretation: Normal sinus rhythm Normal ECG Confirmed by Malvin Johns [id number]) on 12/17/2019 6:11:18 PM   Radiology DG Chest Portable 1 View  Result Date: 12/17/2019 CLINICAL DATA:  Cough, chest pain EXAM: PORTABLE CHEST 1 VIEW COMPARISON:  02/02/2017 FINDINGS: The heart size and mediastinal contours are within normal limits. Both lungs are clear. The visualized skeletal structures are unremarkable. IMPRESSION: No active disease. Electronically Signed   By: Fidela Salisbury MD    On: 12/17/2019 16:53    Procedures Procedures (including critical care time)  Medications Ordered in ED Medications - No data to display  ED Course  I have reviewed the triage vital signs and the nursing notes.  Pertinent labs & imaging results that were available during my care of the patient were reviewed by me and considered in my medical decision making (see chart for details).    MDM Rules/Calculators/A&P                          Patient presents with URI symptoms.  Her chest x-ray is clear without evidence of pneumonia.  Her Covid test is negative.  She is otherwise well-appearing.  Was advised in symptomatic care.  Was advised to follow-up with her PCP if her symptoms are not improving.  Return precautions given.  She was advised to stay away from decongestants given her hypertension. Final Clinical Impression(s) / ED Diagnoses Final diagnoses:  Viral URI with cough    Rx / DC Orders ED Discharge Orders    None       Malvin Johns, MD 12/17/19 1825

## 2019-12-17 NOTE — ED Triage Notes (Addendum)
Pt sts she has cough, runny nose and sneezing x2 days.  Sts she has seasonal allergies but they are nothing like this.  Sts "I feel awful."  Pt reports 10/10 chest pain with cough.

## 2019-12-31 ENCOUNTER — Ambulatory Visit: Payer: Self-pay | Admitting: Family Medicine

## 2020-01-03 NOTE — Progress Notes (Signed)
Subjective:    Patient ID: Amy [name], female    DOB: [birth year], 44 y.o.   MRN: [id number]  44 y.o.F  ED f/u visit   This patient is seen in follow-up from the emergency room where she had chest pain discomfort had a myocardial infarction ruled out.  She has history of hypertension diabetes comes to the clinic to reestablish for primary care.  Note in June hemoglobin A1c was 7.6 and her blood sugars routinely run in the 200s.  She is on the Metformin and this is caused diarrhea and GI upset she cannot tolerate this medication.  She is on no other diabetic medications.  Note her blood pressure is running higher and she does take Allegra-D.    Past Medical History:  Diagnosis Date  . Anemia   . Coronary artery disease   . Diabetes mellitus without complication ([place])   . Excessive or frequent menstruation 09/10/2012  . Hypertension   . NSTEMI (non-ST elevated myocardial infarction) ([place]) 10/30/2019     Family History  Problem Relation Age of Onset  . Breast cancer Mother   . Hypertension Mother   . COPD Other   . Hypertension Other   . Diabetes Other   . Cancer Other   . Heart failure Maternal Aunt      Social History   Socioeconomic History  . Marital status: Married    Spouse name: Jerald Kief  . Number of children: 2  . Years of education: Not on file  . Highest education level: Some college, no degree  Occupational History  . 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 Smoking status: Never Smoker  . Smokeless tobacco: Never Used  Vaping Use  . Vaping Use: Never used  Substance and Sexual Activity  . Alcohol use: No  . Drug use: No  . Sexual activity: Not on file  Other Topics Concern  . Not on file  Social History Narrative   Lives locally.  Does not routinely exercise.  In school @ [employer] for nsg.   Social Determinants of Health   Financial Resource Strain:   . Difficulty of Paying Living Expenses:     Food Insecurity:   . Worried About Charity fundraiser in the Last Year:   . Arboriculturist in the Last Year:   Transportation Needs:   . Film/video editor (Medical):   Marland Kitchen Lack of Transportation (Non-Medical):   Physical Activity:   . Days of Exercise per Week:   . Minutes of Exercise per Session:   Stress:   . Feeling of Stress :   Social Connections:   . Frequency of Communication with Friends and Family:   . Frequency of Social Gatherings with Friends and Family:   . Attends Religious Services:   . Active Member of Clubs or Organizations:   . Attends Archivist Meetings:   Marland Kitchen Marital Status:   Intimate Partner Violence:   . Fear of Current or Ex-Partner:   . Emotionally Abused:   Marland Kitchen Physically Abused:   . Sexually Abused:      Allergies  Allergen Reactions  . Amlodipine Hives  . Hydrocodone-Acetaminophen Hives and Itching    And itching Itching and hives  . Losartan Potassium-Hctz Other (See Comments)    Dizziness  . Tetanus Toxoids     Arm swelling size of a softball.      Outpatient Medications Prior to Visit  Medication  Sig Dispense Refill  . aspirin 81 MG EC tablet Take 1 tablet (81 mg total) by mouth daily. 90 tablet 3  . Blood Glucose Monitoring Suppl (TRUE METRIX METER) w/Device KIT Use to check blood sugars twice per day (Patient taking differently: 1 each by Other route See admin instructions. Use to check blood sugars twice per day) 1 kit 0  . carvedilol (COREG) 6.25 MG tablet Take 1 tablet (6.25 mg total) by mouth 2 (two) times daily with a meal. 60 tablet 6  . glucose blood (TRUE METRIX BLOOD GLUCOSE TEST) test strip Use as instructed to check BS twice per day (Patient taking differently: 1 each by Other route See admin instructions. Use as instructed to check BS twice per day) 100 each 12  . ibuprofen (ADVIL) 600 MG tablet Take 1 tablet (600 mg total) by mouth every 6 (six) hours as needed. (Patient taking differently: Take 600 mg by mouth  every 6 (six) hours as needed for mild pain. ) 30 tablet 0  . Multiple Vitamin (MULTIVITAMIN WITH MINERALS) TABS tablet Take 1 tablet by mouth daily.    . nitroGLYCERIN (NITROSTAT) 0.4 MG SL tablet Place 1 tablet (0.4 mg total) under the tongue every 5 (five) minutes x 3 doses as needed for chest pain. 25 tablet 12  . ondansetron (ZOFRAN ODT) 4 MG disintegrating tablet 53m ODT q4 hours prn nausea/vomit (Patient taking differently: Take 4 mg by mouth every 4 (four) hours as needed for nausea or vomiting. ) 12 tablet 0  . TRUEplus Lancets 28G MISC Use when checking BS twice per day (Patient taking differently: 1 each by Other route See admin instructions. Use when checking BS twice per day) 100 each 11  . atorvastatin (LIPITOR) 80 MG tablet Take 1 tablet (80 mg total) by mouth daily. 30 tablet 6  . fexofenadine-pseudoephedrine (ALLEGRA-D 24) 180-240 MG 24 hr tablet Take 1 tablet by mouth daily.    . metFORMIN (GLUCOPHAGE-XR) 500 MG 24 hr tablet Take 2 tablets (1,000 mg total) by mouth daily after supper. 60 tablet 2  . valsartan-hydrochlorothiazide (DIOVAN-HCT) 80-12.5 MG tablet Take 1 tablet by mouth daily. 30 tablet 2  . ibuprofen (ADVIL) 600 MG tablet Take 1 tablet (600 mg total) by mouth every 6 (six) hours as needed. (Patient not taking: Reported on 01/04/2020) 30 tablet 0   No facility-administered medications prior to visit.      Review of Systems  Constitutional: Negative.   HENT: Negative.   Respiratory: Negative.   Cardiovascular: Negative for chest pain.  Gastrointestinal: Negative.   Endocrine: Positive for polyphagia and polyuria.  Genitourinary: Negative.   Musculoskeletal: Negative.   Neurological: Negative.   Hematological: Negative.   Psychiatric/Behavioral: Negative.        Objective:   Physical Exam Vitals:   01/04/20 1002  BP: 114/78  Pulse: 77  Resp: 16  Temp: 98.1 F (36.7 C)  SpO2: 99%  Weight: 232 lb (105.2 kg)  Height: '5\' 2"'  (1.575 m)    Gen:  Pleasant, obese, in no distress,  normal affect  ENT: Poor dentition mouth clear,  oropharynx clear, no postnasal drip  Neck: No JVD, no TMG, no carotid bruits  Lungs: No use of accessory muscles, no dullness to percussion, clear without rales or rhonchi  Cardiovascular: RRR, heart sounds normal, no murmur or gallops, no peripheral edema  Abdomen: soft and NT, no HSM,  BS normal  Musculoskeletal: No deformities, no cyanosis or clubbing  Neuro: alert, non focal  Skin: Warm,  no lesions or rashes         Assessment & Plan:  I personally reviewed all images and lab data in the [facility] system as well as any outside material available during this office visit and agree with the  radiology impressions.   Essential hypertension Essential hypertension not well controlled  Patient given dietary adjustments and will continue Diovan HCT and Coreg   Type 2 diabetes mellitus without complication, without long-term current use of insulin (HCC) Diabetes with poor control intolerance of Metformin  Begin Jardiance 10 mg daily and discontinue metformin  Begin Trulicity once weekly and patient instructed as to its proper use  Coronary artery disease due to type 2 diabetes mellitus ([place]) Coronary artery disease with a branch of the left anterior obtuse marginal 50% obstructed  Medical therapy indicated  Continue aspirin and atorvastatin 80 mg daily with LDL goal less than 70  Class 3 severe obesity due to excess calories with serious comorbidity and body mass index (BMI) of 40.0 to 44.9 in adult [facility]) Dietary measures given to the patient and will begin Trulicity also referral to nutritionist was given  Dental caries Significant dental caries in the right upper posterior molars  Referral to dentist was given   Diagnoses and all orders for this visit:  Type 2 diabetes mellitus without complication, without long-term current use of insulin (HCC) -     Glucose (CBG), Fasting -     Amb  ref to Medical Nutrition Therapy-MNT  Essential hypertension -     valsartan-hydrochlorothiazide (DIOVAN-HCT) 80-12.5 MG tablet; Take 1 tablet by mouth daily.  Class 3 severe obesity due to excess calories with serious comorbidity and body mass index (BMI) of 40.0 to 44.9 in adult (HCC) -     Amb ref to Medical Nutrition Therapy-MNT  Dental caries  Coronary artery disease due to type 2 diabetes mellitus ([place])  Other orders -     atorvastatin (LIPITOR) 80 MG tablet; Take 1 tablet (80 mg total) by mouth daily. -     Dulaglutide (TRULICITY) 8.41 LK/4.4WN SOPN; Inject 0.5 mLs (0.75 mg total) into the skin once a week. -     empagliflozin (JARDIANCE) 10 MG TABS tablet; Take 1 tablet (10 mg total) by mouth daily.

## 2020-01-04 ENCOUNTER — Other Ambulatory Visit: Payer: Self-pay

## 2020-01-04 ENCOUNTER — Ambulatory Visit: Payer: Self-pay | Attending: Family Medicine | Admitting: Critical Care Medicine

## 2020-01-04 ENCOUNTER — Encounter: Payer: Self-pay | Admitting: Critical Care Medicine

## 2020-01-04 VITALS — BP 114/78 | HR 77 | Temp 98.1°F | Resp 16 | Ht 62.0 in | Wt 232.0 lb

## 2020-01-04 DIAGNOSIS — Z6841 Body Mass Index (BMI) 40.0 and over, adult: Secondary | ICD-10-CM

## 2020-01-04 DIAGNOSIS — E1159 Type 2 diabetes mellitus with other circulatory complications: Secondary | ICD-10-CM

## 2020-01-04 DIAGNOSIS — E119 Type 2 diabetes mellitus without complications: Secondary | ICD-10-CM

## 2020-01-04 DIAGNOSIS — K029 Dental caries, unspecified: Secondary | ICD-10-CM

## 2020-01-04 DIAGNOSIS — I10 Essential (primary) hypertension: Secondary | ICD-10-CM

## 2020-01-04 DIAGNOSIS — I2510 Atherosclerotic heart disease of native coronary artery without angina pectoris: Secondary | ICD-10-CM

## 2020-01-04 LAB — POCT CBG (FASTING - GLUCOSE)-MANUAL ENTRY: Glucose Fasting, POC: 259 mg/dL — AB (ref 70–99)

## 2020-01-04 MED ORDER — TRULICITY 0.75 MG/0.5ML ~~LOC~~ SOAJ
0.7500 mg | SUBCUTANEOUS | 4 refills | Status: DC
Start: 2020-01-04 — End: 2020-02-25

## 2020-01-04 MED ORDER — VALSARTAN-HYDROCHLOROTHIAZIDE 80-12.5 MG PO TABS: 1.0000 | ORAL_TABLET | Freq: Every day | ORAL | 2 refills | Status: DC

## 2020-01-04 MED ORDER — EMPAGLIFLOZIN 10 MG PO TABS: 10.0000 mg | ORAL_TABLET | Freq: Every day | ORAL | 3 refills | Status: DC

## 2020-01-04 MED ORDER — ATORVASTATIN CALCIUM 80 MG PO TABS: 80.0000 mg | ORAL_TABLET | Freq: Every day | ORAL | 6 refills | Status: DC

## 2020-01-04 MED FILL — ATORVASTATIN 80 MG TABLET: 80 | 30 days supply | Qty: 30 | Fill #0

## 2020-01-04 MED FILL — JARDIANCE 10 MG TABLET: 10 | 30 days supply | Qty: 30 | Fill #0

## 2020-01-04 MED FILL — TRULICITY 0.75 MG/0.5 ML PE: 0.75 | 28 days supply | Qty: 2 | Fill #0

## 2020-01-04 MED FILL — VALSARTAN-HCTZ 80-12.5 MG T: 80-12.5 | 30 days supply | Qty: 30 | Fill #0

## 2020-01-04 NOTE — Progress Notes (Signed)
Hospital f /u and DM check

## 2020-01-04 NOTE — Assessment & Plan Note (Signed)
Significant dental caries in the right upper posterior molars  Referral to dentist was given

## 2020-01-04 NOTE — Assessment & Plan Note (Signed)
Dietary measures given to the patient and will begin Trulicity also referral to nutritionist was given

## 2020-01-04 NOTE — Assessment & Plan Note (Signed)
Essential hypertension not well controlled  Patient given dietary adjustments and will continue Diovan HCT and Coreg

## 2020-01-04 NOTE — Patient Instructions (Signed)
Stop metformin  Start Trulicity one injection weekly  Start jardiance one daily  Refills on other medications given  A referral to urgent tooth given for your teeth   Return Dr Delford Field 6 weeks  Referral to diabetic nutritionist given

## 2020-01-04 NOTE — Assessment & Plan Note (Signed)
Coronary artery disease with a branch of the left anterior obtuse marginal 50% obstructed  Medical therapy indicated  Continue aspirin and atorvastatin 80 mg daily with LDL goal less than 70

## 2020-01-04 NOTE — Assessment & Plan Note (Signed)
Diabetes with poor control intolerance of Metformin  Begin Jardiance 10 mg daily and discontinue metformin  Begin Trulicity once weekly and patient instructed as to its proper use

## 2020-01-22 MED FILL — CARVEDILOL 6.25 MG TABLET: 6.25 | 30 days supply | Qty: 60 | Fill #1

## 2020-01-25 MED FILL — TRULICITY 0.75 MG/0.5 ML PE: 0.75 | 28 days supply | Qty: 2 | Fill #1

## 2020-02-09 ENCOUNTER — Ambulatory Visit: Payer: Self-pay | Admitting: Registered"

## 2020-02-11 ENCOUNTER — Ambulatory Visit: Payer: Self-pay | Admitting: Internal Medicine

## 2020-02-25 ENCOUNTER — Other Ambulatory Visit: Payer: Self-pay | Admitting: Critical Care Medicine

## 2020-02-25 ENCOUNTER — Encounter: Payer: Self-pay | Admitting: Critical Care Medicine

## 2020-02-25 ENCOUNTER — Ambulatory Visit: Payer: Self-pay | Attending: Critical Care Medicine | Admitting: Critical Care Medicine

## 2020-02-25 ENCOUNTER — Other Ambulatory Visit: Payer: Self-pay

## 2020-02-25 VITALS — BP 128/82 | HR 68 | Temp 97.5°F | Resp 16 | Wt 226.0 lb

## 2020-02-25 DIAGNOSIS — Z23 Encounter for immunization: Secondary | ICD-10-CM

## 2020-02-25 DIAGNOSIS — E1159 Type 2 diabetes mellitus with other circulatory complications: Secondary | ICD-10-CM

## 2020-02-25 DIAGNOSIS — I10 Essential (primary) hypertension: Secondary | ICD-10-CM

## 2020-02-25 DIAGNOSIS — Z6841 Body Mass Index (BMI) 40.0 and over, adult: Secondary | ICD-10-CM

## 2020-02-25 DIAGNOSIS — E119 Type 2 diabetes mellitus without complications: Secondary | ICD-10-CM

## 2020-02-25 DIAGNOSIS — I2510 Atherosclerotic heart disease of native coronary artery without angina pectoris: Secondary | ICD-10-CM

## 2020-02-25 DIAGNOSIS — Z1159 Encounter for screening for other viral diseases: Secondary | ICD-10-CM

## 2020-02-25 DIAGNOSIS — Z114 Encounter for screening for human immunodeficiency virus [HIV]: Secondary | ICD-10-CM

## 2020-02-25 MED ORDER — ATORVASTATIN CALCIUM 80 MG PO TABS: 80.0000 mg | ORAL_TABLET | Freq: Every day | ORAL | 6 refills | Status: DC

## 2020-02-25 MED ORDER — VALSARTAN 80 MG PO TABS
80.0000 mg | ORAL_TABLET | Freq: Every day | ORAL | 6 refills | Status: DC
Start: 2020-02-25 — End: 2020-02-25

## 2020-02-25 MED ORDER — EMPAGLIFLOZIN 10 MG PO TABS: 10.0000 mg | ORAL_TABLET | Freq: Every day | ORAL | 3 refills | Status: DC

## 2020-02-25 MED ORDER — TRULICITY 0.75 MG/0.5ML ~~LOC~~ SOAJ: 0.7500 mg | SUBCUTANEOUS | 4 refills | Status: DC

## 2020-02-25 MED FILL — JARDIANCE 10 MG TABLET: 10 | 30 days supply | Qty: 30 | Fill #0

## 2020-02-25 MED FILL — ATORVASTATIN CALCIUM 80 MG: 80 | 30 days supply | Qty: 30 | Fill #0

## 2020-02-25 MED FILL — TRULICITY 0.75 MG/0.5 ML PE: 0.75 | 28 days supply | Qty: 2 | Fill #0

## 2020-02-25 NOTE — Progress Notes (Signed)
Subjective:    Patient ID: Amy [name], female    DOB: 1975/10/10, 44 y.o.   MRN: [id number]  01/04/20 44 y.o.F  ED f/u visit   This patient is seen in follow-up from the emergency room where she had chest pain discomfort had a myocardial infarction ruled out.  She has history of hypertension diabetes comes to the clinic to reestablish for primary care.  Note in June hemoglobin A1c was 7.6 and her blood sugars routinely run in the 200s.  She is on the Metformin and this is caused diarrhea and GI upset she cannot tolerate this medication.  She is on no other diabetic medications.  Note her blood pressure is running higher and she does take Allegra-D.  02/25/2020 Patient returns in follow-up for diabetes and hypertension doing well.  Blood sugars are coming down to the 100 range.  She does complain of increased urination 3-4 times daily on the Jardiance.  Her appetite has decreased and weight is down.  She is maintaining Trulicity and the Jardiance together  The patient is due a flu vaccine and pneumococcal vaccine Wt Readings from Last 3 Encounters: 02/25/20 : 226 lb (102.5 kg) 01/04/20 : 232 lb (105.2 kg) 12/17/19 : (!) 231 lb 1.6 oz (104.8 kg)  Past Medical History:  Diagnosis Date  . Anemia   . Coronary artery disease   . Diabetes mellitus without complication ([place])   . Excessive or frequent menstruation 09/10/2012  . Hypertension   . NSTEMI (non-ST elevated myocardial infarction) ([place]) 10/30/2019     Family History  Problem Relation Age of Onset  . Breast cancer Mother   . Hypertension Mother   . COPD Other   . Hypertension Other   . Diabetes Other   . Cancer Other   . Heart failure Maternal Aunt      Social History   Socioeconomic History  . Marital status: Married    Spouse name: Jerald Kief  . Number of children: 2  . Years of education: Not on file  . Highest education level: Some college, no degree  Occupational History  . 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 Smoking status: Never Smoker  . Smokeless tobacco: Never Used  Vaping Use  . Vaping Use: Never used  Substance and Sexual Activity  . Alcohol use: No  . Drug use: No  . Sexual activity: Not on file  Other Topics Concern  . Not on file  Social History Narrative   Lives locally.  Does not routinely exercise.  In school @ [employer] for nsg.   Social Determinants of Health   Financial Resource Strain:   . Difficulty of Paying Living Expenses: Not on file  Food Insecurity:   . Worried About Charity fundraiser in the Last Year: Not on file  . Ran Out of Food in the Last Year: Not on file  Transportation Needs:   . Lack of Transportation (Medical): Not on file  . Lack of Transportation (Non-Medical): Not on file  Physical Activity:   . Days of Exercise per Week: Not on file  . Minutes of Exercise per Session: Not on file  Stress:   . Feeling of Stress : Not on file  Social Connections:   . Frequency of Communication with Friends and Family: Not on file  . Frequency of Social Gatherings with Friends and Family: Not on file  . Attends Religious Services: Not on file  .  Active Member of Clubs or Organizations: Not on file  . Attends Archivist Meetings: Not on file  . Marital Status: Not on file  Intimate Partner Violence:   . Fear of Current or Ex-Partner: Not on file  . Emotionally Abused: Not on file  . Physically Abused: Not on file  . Sexually Abused: Not on file     Allergies  Allergen Reactions  . Amlodipine Hives  . Hydrocodone-Acetaminophen Hives and Itching    And itching Itching and hives  . Losartan Potassium-Hctz Other (See Comments)    Dizziness  . Tetanus Toxoids     Arm swelling size of a softball.      Outpatient Medications Prior to Visit  Medication Sig Dispense Refill  . aspirin 81 MG EC tablet Take 1 tablet (81 mg total) by mouth daily. 90 tablet 3  . Blood Glucose Monitoring Suppl  (TRUE METRIX METER) w/Device KIT Use to check blood sugars twice per day (Patient taking differently: 1 each by Other route See admin instructions. Use to check blood sugars twice per day) 1 kit 0  . carvedilol (COREG) 6.25 MG tablet Take 1 tablet (6.25 mg total) by mouth 2 (two) times daily with a meal. 60 tablet 6  . glucose blood (TRUE METRIX BLOOD GLUCOSE TEST) test strip Use as instructed to check BS twice per day (Patient taking differently: 1 each by Other route See admin instructions. Use as instructed to check BS twice per day) 100 each 12  . ibuprofen (ADVIL) 600 MG tablet Take 1 tablet (600 mg total) by mouth every 6 (six) hours as needed. (Patient taking differently: Take 600 mg by mouth every 6 (six) hours as needed for mild pain. ) 30 tablet 0  . Multiple Vitamin (MULTIVITAMIN WITH MINERALS) TABS tablet Take 1 tablet by mouth daily.    . nitroGLYCERIN (NITROSTAT) 0.4 MG SL tablet Place 1 tablet (0.4 mg total) under the tongue every 5 (five) minutes x 3 doses as needed for chest pain. 25 tablet 12  . ondansetron (ZOFRAN ODT) 4 MG disintegrating tablet 49m ODT q4 hours prn nausea/vomit (Patient taking differently: Take 4 mg by mouth every 4 (four) hours as needed for nausea or vomiting. ) 12 tablet 0  . TRUEplus Lancets 28G MISC Use when checking BS twice per day (Patient taking differently: 1 each by Other route See admin instructions. Use when checking BS twice per day) 100 each 11  . atorvastatin (LIPITOR) 80 MG tablet Take 1 tablet (80 mg total) by mouth daily. 30 tablet 6  . Dulaglutide (TRULICITY) 02.72MZD/6.6YQSOPN Inject 0.5 mLs (0.75 mg total) into the skin once a week. 2 mL 4  . empagliflozin (JARDIANCE) 10 MG TABS tablet Take 1 tablet (10 mg total) by mouth daily. 30 tablet 3  . valsartan-hydrochlorothiazide (DIOVAN-HCT) 80-12.5 MG tablet Take 1 tablet by mouth daily. 30 tablet 2   No facility-administered medications prior to visit.      Review of Systems  Constitutional:  Positive for appetite change.  HENT: Negative.   Respiratory: Negative.   Cardiovascular: Negative for chest pain.  Gastrointestinal: Negative.   Endocrine: Negative for polyphagia and polyuria.  Genitourinary: Negative.   Musculoskeletal: Negative.   Neurological: Negative.   Hematological: Negative.   Psychiatric/Behavioral: Negative.        Objective:   Physical Exam Vitals:   02/25/20 1051  BP: 128/82  Pulse: 68  Resp: 16  Temp: (!) 97.5 F (36.4 C)  SpO2: 98%  Weight: 226 lb (102.5 kg)    Gen: Pleasant, obese, in no distress,  normal affect  ENT: Poor dentition mouth clear,  oropharynx clear, no postnasal drip  Neck: No JVD, no TMG, no carotid bruits  Lungs: No use of accessory muscles, no dullness to percussion, clear without rales or rhonchi  Cardiovascular: RRR, heart sounds normal, no murmur or gallops, no peripheral edema  Abdomen: soft and NT, no HSM,  BS normal  Musculoskeletal: No deformities, no cyanosis or clubbing  Neuro: alert, non focal  Skin: Warm, no lesions or rashes         Assessment & Plan:  I personally reviewed all images and lab data in the [facility] system as well as any outside material available during this office visit and agree with the  radiology impressions.   Coronary artery disease due to type 2 diabetes mellitus ([place]) Coronary artery disease due to diabetes stable at this time continue current medications including high-dose atorvastatin  Essential hypertension Blood pressure well controlled no changes  Class 3 severe obesity due to excess calories with serious comorbidity and body mass index (BMI) of 40.0 to 44.9 in adult (HCC) Weight coming down nicely with Jardiance  Type 2 diabetes mellitus without complication, without long-term current use of insulin (HCC) No change in diabetic profile for now   Diagnoses and all orders for this visit:  Essential hypertension -     Basic metabolic panel  Type 2 diabetes  mellitus without complication, without long-term current use of insulin (HCC) -     Basic metabolic panel  Need for hepatitis C screening test -     HCV Ab w/Rflx to Verification  Encounter for screening for HIV -     HIV Antibody (routine testing w rflx)  Need for immunization against influenza -     Flu Vaccine QUAD 36+ mos IM  Coronary artery disease due to type 2 diabetes mellitus ([place])  Class 3 severe obesity due to excess calories with serious comorbidity and body mass index (BMI) of 40.0 to 44.9 in adult [facility])  Other orders -     empagliflozin (JARDIANCE) 10 MG TABS tablet; Take 1 tablet (10 mg total) by mouth daily. -     atorvastatin (LIPITOR) 80 MG tablet; Take 1 tablet (80 mg total) by mouth daily. -     valsartan (DIOVAN) 80 MG tablet; Take 1 tablet (80 mg total) by mouth daily. -     Dulaglutide (TRULICITY) 2.11 DB/5.2CE SOPN; Inject 0.75 mg into the skin once a week.

## 2020-02-25 NOTE — Assessment & Plan Note (Signed)
No change in diabetic profile for now

## 2020-02-25 NOTE — Patient Instructions (Signed)
We will change the Diovan HCT to Diovan alone which is valsartan  All her medications are unchanged and were refilled today  A pneumonia vaccine and flu shot were given today  We are checking hepatitis C assay and also basic metabolic panel in your labs today  Please keep yourself well-hydrated  Return 1 month

## 2020-02-25 NOTE — Assessment & Plan Note (Signed)
Blood pressure well controlled no changes ?

## 2020-02-25 NOTE — Assessment & Plan Note (Signed)
Coronary artery disease due to diabetes stable at this time continue current medications including high-dose atorvastatin

## 2020-02-25 NOTE — Assessment & Plan Note (Signed)
Weight coming down nicely with [place]

## 2020-02-26 LAB — BASIC METABOLIC PANEL
BUN/Creatinine Ratio: 14 (ref 9–23)
BUN: 11 mg/dL (ref 6–24)
CO2: 28 mmol/L (ref 20–29)
Calcium: 9.5 mg/dL (ref 8.7–10.2)
Chloride: 103 mmol/L (ref 96–106)
Creatinine, Ser: 0.8 mg/dL (ref 0.57–1.00)
GFR calc Af Amer: 102 mL/min/{1.73_m2} (ref >59)
GFR calc non Af Amer: 89 mL/min/{1.73_m2} (ref >59)
Glucose: 109 mg/dL — ABNORMAL HIGH (ref 65–99)
Potassium: 4.1 mmol/L (ref 3.5–5.2)
Sodium: 142 mmol/L (ref 134–144)

## 2020-02-26 LAB — HCV INTERPRETATION

## 2020-02-26 LAB — HIV ANTIBODY (ROUTINE TESTING W REFLEX): HIV Screen 4th Generation wRfx: NONREACTIVE

## 2020-02-26 LAB — HCV AB W/RFLX TO VERIFICATION: HCV Ab: <0.1 s/co ratio (ref 0.0–0.9)

## 2020-02-26 MED FILL — VALSARTAN 80 MG TABLET: 80 | 30 days supply | Qty: 30 | Fill #0

## 2020-03-22 ENCOUNTER — Telehealth: Payer: Self-pay | Admitting: Family Medicine

## 2020-03-22 NOTE — Telephone Encounter (Signed)
Copied from CRM [id number]. Topic: General - Inquiry >> Mar 22, 2020  2:48 PM Leary Roca wrote: Reason for CRM: Pt called back to inform office know that sh can get hr pneumonia shot same day as hr appt on Nov. 11th. Plase advise and sch .

## 2020-03-22 NOTE — Telephone Encounter (Signed)
Called patient and LVM to return call and schedule an appt with Franky Macho to have her pneumo 23 vaccine.

## 2020-03-30 ENCOUNTER — Telehealth: Payer: Self-pay | Admitting: Family Medicine

## 2020-03-30 NOTE — Telephone Encounter (Signed)
Att to contact pt to advise that she can have either the Pfizer or Moderna boosters even though she received J&J vaccine lvm to call ofc. Info can be relayed to pt

## 2020-03-30 NOTE — Telephone Encounter (Signed)
Copied from CRM [id number]. Topic: General - Other >> Mar 30, 2020 12:55 PM Laural Benes, [place] C wrote: Reason for CRM: pt called in to be advised by her provider. Pt says that she had the J&J on 09/07/19, pt was told by her employer to consult with provider to see which booster pt should take, please advise.    CB: [phone]-

## 2020-04-05 ENCOUNTER — Encounter: Payer: Self-pay | Admitting: Internal Medicine

## 2020-04-05 ENCOUNTER — Ambulatory Visit (INDEPENDENT_AMBULATORY_CARE_PROVIDER_SITE_OTHER): Payer: Self-pay | Admitting: Internal Medicine

## 2020-04-05 ENCOUNTER — Other Ambulatory Visit: Payer: Self-pay

## 2020-04-05 ENCOUNTER — Other Ambulatory Visit: Payer: Self-pay | Admitting: Internal Medicine

## 2020-04-05 VITALS — BP 122/90 | HR 84 | Ht 62.0 in | Wt 222.0 lb

## 2020-04-05 DIAGNOSIS — E785 Hyperlipidemia, unspecified: Secondary | ICD-10-CM

## 2020-04-05 DIAGNOSIS — E119 Type 2 diabetes mellitus without complications: Secondary | ICD-10-CM

## 2020-04-05 DIAGNOSIS — I10 Essential (primary) hypertension: Secondary | ICD-10-CM

## 2020-04-05 DIAGNOSIS — I2510 Atherosclerotic heart disease of native coronary artery without angina pectoris: Secondary | ICD-10-CM

## 2020-04-05 MED ORDER — ATORVASTATIN CALCIUM 40 MG PO TABS: 40.0000 mg | ORAL_TABLET | Freq: Every day | ORAL | 3 refills | Status: DC

## 2020-04-05 MED FILL — ATORVASTATIN CALCIUM 40 MG: 40 | 30 days supply | Qty: 30 | Fill #0

## 2020-04-05 NOTE — Patient Instructions (Signed)
Medication Instructions:  DECREASE atorvastatin to 40mg  daily  *If you need a refill on your cardiac medications before your next appointment, please call your pharmacy*   Lab Work: FASTING LIPID PANEL in 3 months  If you have labs (blood work) drawn today and your tests are completely normal, you will receive your results only by: MyChart Message (if you have MyChart) OR . A paper copy in the mail If you have any lab test that is abnormal or we need to change your treatment, we will call you to review the results.   Testing/Procedures: NONE   Follow-Up: At [facility], you and your health needs are our priority.  As part of our continuing mission to provide you with exceptional heart care, we have created designated Provider Care Teams.  These Care Teams include your primary Cardiologist (physician) and Advanced Practice Providers (APPs -  Physician Assistants and Nurse Practitioners) who all work together to provide you with the care you need, when you need it.  We recommend signing up for the patient portal called "MyChart".  Sign up information is provided on this After Visit Summary.  MyChart is used to connect with patients for Virtual Visits (Telemedicine).  Patients are able to view lab/test results, encounter notes, upcoming appointments, etc.  Non-urgent messages can be sent to your provider as well.   To learn more about what you can do with MyChart, go to [facility].    Your next appointment:   6 month(s)  The format for your next appointment:   In Person  Provider:   You may see [email or web address], MD or one of the following Advanced Practice Providers on your designated Care Team:    Chrystie Nose, PA-C  Azalee Course, PA-C or   Micah Flesher, Judy Pimple    Other Instructions

## 2020-04-05 NOTE — Progress Notes (Signed)
OFFICE NOTE  Chief Complaint:  Follow-up  Primary Care Physician: Antony Blackbird, MD  HPI:  Amy [name] is a 44 y.o. female with a past medial history significant for chest pain and recent NSTEMI in June 2021 with abnormal EKG demonstrating deep T wave inversions.  She underwent cardiac catheterization which showed only a 50% intermediate vessel stenosis however there was catheter induced spasm.  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  She also has cardiac risk factors including diabetes, hypertension and dyslipidemia.  She has been on high potency statin therapy however lipids in July were white low with a total cholesterol 94, HDL 32, LDL 44 and triglycerides 91.  She is also been experiencing persistent right wrist and arm pain.  This has been significant and she did underwent catheterization in that arm.  She did have a ultrasound which showed no evidence of arteriovenous issues.  She has a follow-up with her PCP in November however it seems like this needs to be further evaluated either with imaging or possible referral to neurology or orthopedics.  PMHx:  Past Medical History:  Diagnosis Date  . Anemia   . Coronary artery disease   . Diabetes mellitus without complication ([place])   . Excessive or frequent menstruation 09/10/2012  . Hypertension   . NSTEMI (non-ST elevated myocardial infarction) ([place]) 10/30/2019    Past Surgical History:  Procedure Laterality Date  . CHOLECYSTECTOMY  2006  . CORONARY ANGIOPLASTY    . DILITATION & CURRETTAGE/HYSTROSCOPY WITH HYDROTHERMAL ABLATION N/A 07/10/2013   Procedure: DILATATION & CURETTAGE/HYSTEROSCOPY WITH HYDROTHERMAL ABLATION;  Surgeon: Shelly Bombard, MD;  Location: [facility];  Service: Gynecology;  Laterality: N/A;  . LEFT HEART CATH AND CORONARY ANGIOGRAPHY N/A 11/02/2019   Procedure: LEFT HEART CATH AND CORONARY ANGIOGRAPHY;  Surgeon: Sherren Mocha, MD;  Location: [facility];  Service: Cardiovascular;  Laterality:  N/A;  . TUBAL LIGATION  1997  . WISDOM TOOTH EXTRACTION  1998    FAMHx:  Family History  Problem Relation Age of Onset  . Breast cancer Mother   . Hypertension Mother   . COPD Other   . Hypertension Other   . Diabetes Other   . Cancer Other   . Heart failure Maternal Aunt     SOCHx:   reports that she has never smoked. She has never used smokeless tobacco. She reports that she does not drink alcohol and does not use drugs.  ALLERGIES:  Allergies  Allergen Reactions  . Amlodipine Hives  . Hydrocodone-Acetaminophen Hives and Itching    And itching Itching and hives  . Losartan Potassium-Hctz Other (See Comments)    Dizziness  . Tetanus Toxoids     Arm swelling size of a softball.     ROS: Pertinent items noted in HPI and remainder of comprehensive ROS otherwise negative.  HOME MEDS: Current Outpatient Medications on File Prior to Visit  Medication Sig Dispense Refill  . aspirin 81 MG EC tablet Take 1 tablet (81 mg total) by mouth daily. 90 tablet 3  . atorvastatin (LIPITOR) 80 MG tablet Take 1 tablet (80 mg total) by mouth daily. 30 tablet 6  . Blood Glucose Monitoring Suppl (TRUE METRIX METER) w/Device KIT Use to check blood sugars twice per day (Patient taking differently: 1 each by Other route See admin instructions. Use to check blood sugars twice per day) 1 kit 0  . carvedilol (COREG) 6.25 MG tablet Take 1 tablet (6.25 mg total) by mouth 2 (two) times daily with  a meal. 60 tablet 6  . Dulaglutide (TRULICITY) 2.58 NI/7.7OE SOPN Inject 0.75 mg into the skin once a week. 2 mL 4  . empagliflozin (JARDIANCE) 10 MG TABS tablet Take 1 tablet (10 mg total) by mouth daily. 30 tablet 3  . glucose blood (TRUE METRIX BLOOD GLUCOSE TEST) test strip Use as instructed to check BS twice per day (Patient taking differently: 1 each by Other route See admin instructions. Use as instructed to check BS twice per day) 100 each 12  . ibuprofen (ADVIL) 600 MG tablet Take 1 tablet (600 mg  total) by mouth every 6 (six) hours as needed. (Patient taking differently: Take 600 mg by mouth every 6 (six) hours as needed for mild pain. ) 30 tablet 0  . Multiple Vitamin (MULTIVITAMIN WITH MINERALS) TABS tablet Take 1 tablet by mouth daily.    . nitroGLYCERIN (NITROSTAT) 0.4 MG SL tablet Place 1 tablet (0.4 mg total) under the tongue every 5 (five) minutes x 3 doses as needed for chest pain. 25 tablet 12  . ondansetron (ZOFRAN ODT) 4 MG disintegrating tablet 25m ODT q4 hours prn nausea/vomit (Patient taking differently: Take 4 mg by mouth every 4 (four) hours as needed for nausea or vomiting. ) 12 tablet 0  . TRUEplus Lancets 28G MISC Use when checking BS twice per day (Patient taking differently: 1 each by Other route See admin instructions. Use when checking BS twice per day) 100 each 11  . valsartan (DIOVAN) 80 MG tablet Take 1 tablet (80 mg total) by mouth daily. 30 tablet 6   No current facility-administered medications on file prior to visit.    LABS/IMAGING: No results found for this or any previous visit (from the past 48 hour(s)). No results found.  LIPID PANEL:    Component Value Date/Time   CHOL 94 (L) 12/03/2019 0827   TRIG 91 12/03/2019 0827   HDL 32 (L) 12/03/2019 0827   CHOLHDL 2.9 12/03/2019 0827   CHOLHDL 4.1 11/01/2019 0726   VLDL 23 11/01/2019 0726   LDLCALC 44 12/03/2019 0827     WEIGHTS: Wt Readings from Last 3 Encounters:  04/05/20 222 lb (100.7 kg)  02/25/20 226 lb (102.5 kg)  01/04/20 232 lb (105.2 kg)    VITALS: BP 122/90   Pulse 84   Ht 5' 2"  (1.575 m)   Wt 222 lb (100.7 kg)   SpO2 96%   BMI 40.60 kg/m   EXAM: General appearance: alert, no distress and morbidly obese Neck: no carotid bruit, no JVD and thyroid not enlarged, symmetric, no tenderness/mass/nodules Lungs: clear to auscultation bilaterally Heart: regular rate and rhythm, S1, S2 normal, no murmur, click, rub or gallop Abdomen: soft, non-tender; bowel sounds normal; no masses,   no organomegaly Extremities: extremities normal, atraumatic, no cyanosis or edema Pulses: 2+ and symmetric Skin: Skin color, texture, turgor normal. No rashes or lesions Neurologic: Grossly normal : Pleasant  EKG: Sinus rhythm with sinus arrhythmia at 84- personally reviewed  ASSESSMENT: 1. NSTEMI-thought to be related to vasospasm (10/2019) 2. Type 2 diabetes 3. Dyslipidemia 4. Hypertension 5. Morbid obesity  PLAN: 1.   Ms. Amy Fritzis doing better without any recurrent chest pain.  Her cardiac episode is possibly best described as a variant angina as she was noted to have vasospasm and actually elevated troponins with deep T wave changes which have resolved.  She has multiple cardiovascular risk factors.  Her lipids are quite low and I do not think she needs to be on high  potency atorvastatin.  I advised her to decrease that from 80 to 40 mg daily.  I wonder if this could be playing a role in her persistent right forearm pain.  We did do ultrasounds without any clear evidence of vascular compromise.  She may need orthopedics or neurology consult.  Plan follow-up with me in 6 months and repeat lipids in 3 months.  Pixie Casino, MD, [facility], [place] Director of the Advanced Lipid Disorders &  Cardiovascular Risk Reduction Clinic Diplomate of the American Board of Clinical Lipidology Attending Cardiologist  Direct Dial: [phone]  Fax: [phone]  Website:  [email or web address] Halvor [email or web address] 04/05/2020, 10:32 AM

## 2020-04-06 MED FILL — TRULICITY 0.75 MG/0.5 ML PE: 0.75 | 28 days supply | Qty: 2 | Fill #1

## 2020-04-06 MED FILL — CARVEDILOL 6.25 MG TABLET: 6.25 | 30 days supply | Qty: 60 | Fill #2

## 2020-04-06 MED FILL — VALSARTAN 80 MG TABLET: 80 | 30 days supply | Qty: 30 | Fill #1

## 2020-04-06 MED FILL — JARDIANCE 10 MG TABLET: 10 | 30 days supply | Qty: 30 | Fill #1

## 2020-04-07 ENCOUNTER — Other Ambulatory Visit: Payer: Self-pay

## 2020-04-07 ENCOUNTER — Ambulatory Visit: Payer: Self-pay | Attending: Critical Care Medicine | Admitting: Critical Care Medicine

## 2020-04-07 ENCOUNTER — Encounter: Payer: Self-pay | Admitting: Critical Care Medicine

## 2020-04-07 VITALS — BP 135/86 | HR 67 | Temp 98.0°F | Resp 18 | Ht 62.0 in | Wt 222.6 lb

## 2020-04-07 DIAGNOSIS — I10 Essential (primary) hypertension: Secondary | ICD-10-CM

## 2020-04-07 DIAGNOSIS — M25531 Pain in right wrist: Secondary | ICD-10-CM

## 2020-04-07 DIAGNOSIS — S6421XA Injury of radial nerve at wrist and hand level of right arm, initial encounter: Secondary | ICD-10-CM | POA: Insufficient documentation

## 2020-04-07 DIAGNOSIS — E119 Type 2 diabetes mellitus without complications: Secondary | ICD-10-CM

## 2020-04-07 DIAGNOSIS — Z6841 Body Mass Index (BMI) 40.0 and over, adult: Secondary | ICD-10-CM

## 2020-04-07 DIAGNOSIS — Z23 Encounter for immunization: Secondary | ICD-10-CM

## 2020-04-07 LAB — GLUCOSE, POCT (MANUAL RESULT ENTRY): POC Glucose: 126 mg/dl — AB (ref 70–99)

## 2020-04-07 LAB — POCT GLYCOSYLATED HEMOGLOBIN (HGB A1C): Hemoglobin A1C: 6.3 % — AB (ref 4.0–5.6)

## 2020-04-07 MED ORDER — EMPAGLIFLOZIN 10 MG PO TABS: 10.0000 mg | ORAL_TABLET | Freq: Every day | ORAL | 3 refills | Status: DC

## 2020-04-07 NOTE — Assessment & Plan Note (Signed)
Hypertension well controlled at this time continue current medications

## 2020-04-07 NOTE — Assessment & Plan Note (Signed)
Patient given diet instruction

## 2020-04-07 NOTE — Patient Instructions (Addendum)
Please obtain financial application for [employer] discount so we can seek an imaging study of your wrist  No change in medications  Follow the healthy diet we discussed  Pneumovax given  Pneumococcal Vaccine, Polyvalent solution for injection What is this medicine? PNEUMOCOCCAL VACCINE, POLYVALENT (NEU mo KOK al vak SEEN, pol ee VEY luhnt) is a vaccine to prevent pneumococcus bacteria infection. These bacteria are a major cause of ear infections, Strep throat infections, and serious pneumonia, meningitis, or blood infections worldwide. These vaccines help the body to produce antibodies (protective substances) that help your body defend against these bacteria. This vaccine is recommended for people 46 years of age and older with health problems. It is also recommended for all adults over 46 years old. This vaccine will not treat an infection. This medicine may be used for other purposes; ask your health care provider or pharmacist if you have questions. COMMON BRAND NAME(S): Pneumovax 23 What should I tell my health care provider before I take this medicine? They need to know if you have any of these conditions:  bleeding problems  bone marrow or organ transplant  cancer, Hodgkin's disease  fever  infection  immune system problems  low platelet count in the blood  seizures  an unusual or allergic reaction to pneumococcal vaccine, diphtheria toxoid, other vaccines, latex, other medicines, foods, dyes, or preservatives  pregnant or trying to get pregnant  breast-feeding How should I use this medicine? This vaccine is for injection into a muscle or under the skin. It is given by a health care professional. A copy of Vaccine Information Statements will be given before each vaccination. Read this sheet carefully each time. The sheet may change frequently. Talk to your pediatrician regarding the use of this medicine in children. While this drug may be prescribed for children as  young as 46 years of age for selected conditions, precautions do apply. Overdosage: If you think you have taken too much of this medicine contact a poison control center or emergency room at once. NOTE: This medicine is only for you. Do not share this medicine with others. What if I miss a dose? It is important not to miss your dose. Call your doctor or health care professional if you are unable to keep an appointment. What may interact with this medicine?  medicines for cancer chemotherapy  medicines that suppress your immune function  medicines that treat or prevent blood clots like warfarin, enoxaparin, and dalteparin  steroid medicines like prednisone or cortisone This list may not describe all possible interactions. Give your health care provider a list of all the medicines, herbs, non-prescription drugs, or dietary supplements you use. Also tell them if you smoke, drink alcohol, or use illegal drugs. Some items may interact with your medicine. What should I watch for while using this medicine? Mild fever and pain should go away in 3 days or less. Report any unusual symptoms to your doctor or health care professional. What side effects may I notice from receiving this medicine? Side effects that you should report to your doctor or health care professional as soon as possible:  allergic reactions like skin rash, itching or hives, swelling of the face, lips, or tongue  breathing problems  confused  fever over 102 degrees F  pain, tingling, numbness in the hands or feet  seizures  unusual bleeding or bruising  unusual muscle weakness Side effects that usually do not require medical attention (report to your doctor or health care professional if they  continue or are bothersome):  aches and pains  diarrhea  fever of 102 degrees F or less  headache  irritable  loss of appetite  pain, tender at site where injected  trouble sleeping This list may not describe all  possible side effects. Call your doctor for medical advice about side effects. You may report side effects to FDA at 1-800-FDA-1088. Where should I keep my medicine? This does not apply. This vaccine is given in a clinic, pharmacy, doctor's office, or other health care setting and will not be stored at home. NOTE: This sheet is a summary. It may not cover all possible information. If you have questions about this medicine, talk to your doctor, pharmacist, or health care provider.  2020 Elsevier/Gold Standard (2007-12-19 14:32:37)   Return to Dr. Delford Field 4 months   Diabetes Mellitus and Nutrition, Adult When you have diabetes (diabetes mellitus), it is very important to have healthy eating habits because your blood sugar (glucose) levels are greatly affected by what you eat and drink. Eating healthy foods in the appropriate amounts, at about the same times every day, can help you:  Control your blood glucose.  Lower your risk of heart disease.  Improve your blood pressure.  Reach or maintain a healthy weight. Every person with diabetes is different, and each person has different needs for a meal plan. Your health care provider may recommend that you work with a diet and nutrition specialist (dietitian) to make a meal plan that is best for you. Your meal plan may vary depending on factors such as:  The calories you need.  The medicines you take.  Your weight.  Your blood glucose, blood pressure, and cholesterol levels.  Your activity level.  Other health conditions you have, such as heart or kidney disease. How do carbohydrates affect me? Carbohydrates, also called carbs, affect your blood glucose level more than any other type of food. Eating carbs naturally raises the amount of glucose in your blood. Carb counting is a method for keeping track of how many carbs you eat. Counting carbs is important to keep your blood glucose at a healthy level, especially if you use insulin or take  certain oral diabetes medicines. It is important to know how many carbs you can safely have in each meal. This is different for every person. Your dietitian can help you calculate how many carbs you should have at each meal and for each snack. Foods that contain carbs include:  Bread, cereal, rice, pasta, and crackers.  Potatoes and corn.  Peas, beans, and lentils.  Milk and yogurt.  Fruit and juice.  Desserts, such as cakes, cookies, ice cream, and candy. How does alcohol affect me? Alcohol can cause a sudden decrease in blood glucose (hypoglycemia), especially if you use insulin or take certain oral diabetes medicines. Hypoglycemia can be a life-threatening condition. Symptoms of hypoglycemia (sleepiness, dizziness, and confusion) are similar to symptoms of having too much alcohol. If your health care provider says that alcohol is safe for you, follow these guidelines:  Limit alcohol intake to no more than 1 drink per day for nonpregnant women and 2 drinks per day for men. One drink equals 12 oz of beer, 5 oz of wine, or 1 oz of hard liquor.  Do not drink on an empty stomach.  Keep yourself hydrated with water, diet soda, or unsweetened iced tea.  Keep in mind that regular soda, juice, and other mixers may contain a lot of sugar and must be counted  as carbs. What are tips for following this plan?  Reading food labels  Start by checking the serving size on the "Nutrition Facts" label of packaged foods and drinks. The amount of calories, carbs, fats, and other nutrients listed on the label is based on one serving of the item. Many items contain more than one serving per package.  Check the total grams (g) of carbs in one serving. You can calculate the number of servings of carbs in one serving by dividing the total carbs by 15. For example, if a food has 30 g of total carbs, it would be equal to 2 servings of carbs.  Check the number of grams (g) of saturated and trans fats in one  serving. Choose foods that have low or no amount of these fats.  Check the number of milligrams (mg) of salt (sodium) in one serving. Most people should limit total sodium intake to less than 2,300 mg per day.  Always check the nutrition information of foods labeled as "low-fat" or "nonfat". These foods may be higher in added sugar or refined carbs and should be avoided.  Talk to your dietitian to identify your daily goals for nutrients listed on the label. Shopping  Avoid buying canned, premade, or processed foods. These foods tend to be high in fat, sodium, and added sugar.  Shop around the outside edge of the grocery store. This includes fresh fruits and vegetables, bulk grains, fresh meats, and fresh dairy. Cooking  Use low-heat cooking methods, such as baking, instead of high-heat cooking methods like deep frying.  Cook using healthy oils, such as olive, canola, or sunflower oil.  Avoid cooking with butter, cream, or high-fat meats. Meal planning  Eat meals and snacks regularly, preferably at the same times every day. Avoid going long periods of time without eating.  Eat foods high in fiber, such as fresh fruits, vegetables, beans, and whole grains. Talk to your dietitian about how many servings of carbs you can eat at each meal.  Eat 4-6 ounces (oz) of lean protein each day, such as lean meat, chicken, fish, eggs, or tofu. One oz of lean protein is equal to: ? 1 oz of meat, chicken, or fish. ? 1 egg. ?  cup of tofu.  Eat some foods each day that contain healthy fats, such as avocado, nuts, seeds, and fish. Lifestyle  Check your blood glucose regularly.  Exercise regularly as told by your health care provider. This may include: ? 150 minutes of moderate-intensity or vigorous-intensity exercise each week. This could be brisk walking, biking, or water aerobics. ? Stretching and doing strength exercises, such as yoga or weightlifting, at least 2 times a week.  Take medicines  as told by your health care provider.  Do not use any products that contain nicotine or tobacco, such as cigarettes and e-cigarettes. If you need help quitting, ask your health care provider.  Work with a Veterinary surgeon or diabetes educator to identify strategies to manage stress and any emotional and social challenges. Questions to ask a health care provider  Do I need to meet with a diabetes educator?  Do I need to meet with a dietitian?  What number can I call if I have questions?  When are the best times to check my blood glucose? Where to find more information:  American Diabetes Association: diabetes.org  Academy of Nutrition and Dietetics: www.eatright.[employer] of Diabetes and Digestive and Kidney Diseases (NIH): CarFlippers.tn Summary  A healthy meal plan will  help you control your blood glucose and maintain a healthy lifestyle.  Working with a diet and nutrition specialist (dietitian) can help you make a meal plan that is best for you.  Keep in mind that carbohydrates (carbs) and alcohol have immediate effects on your blood glucose levels. It is important to count carbs and to use alcohol carefully. This information is not intended to replace advice given to you by your health care provider. Make sure you discuss any questions you have with your health care provider. Document Revised: 04/26/2017 Document Reviewed: 06/18/2016 Elsevier Patient Education  2020 [employer].

## 2020-04-07 NOTE — Progress Notes (Signed)
Subjective:    Patient ID: Amy [name], female    DOB: [birth year], 44 y.o.   MRN: [id number]  01/04/20 44 y.o.F  ED f/u visit   This patient is seen in follow-up from the emergency room where she had chest pain discomfort had a myocardial infarction ruled out.  She has history of hypertension diabetes comes to the clinic to reestablish for primary care.  Note in June hemoglobin A1c was 7.6 and her blood sugars routinely run in the 200s.  She is on the Metformin and this is caused diarrhea and GI upset she cannot tolerate this medication.  She is on no other diabetic medications.  Note her blood pressure is running higher and she does take Allegra-D.  02/25/2020 Patient returns in follow-up for diabetes and hypertension doing well.  Blood sugars are coming down to the 100 range.  She does complain of increased urination 3-4 times daily on the Jardiance.  Her appetite has decreased and weight is down.  She is maintaining Trulicity and the Jardiance together  The patient is due a flu vaccine and pneumococcal vaccine Wt Readings from Last 3 Encounters: 02/25/20 : 226 lb (102.5 kg) 01/04/20 : 232 lb (105.2 kg) 12/17/19 : (!) 231 lb 1.6 oz (104.8 kg)  04/07/2020 This patient is seen in return follow-up for diabetes morbid obesity depression coronary disease hyperlipidemia hypertension.  Overall the patient is doing well note on arrival hemoglobin A1c was 6.3 down from 7.6 in June.  Blood sugar today was 120.  Patient's complaint is only that of right wrist pain.  This occurred after she had a radial catheterization the radial artery earlier this year.  She has had pain in the wrist ever since.  She denies any other significant complaints she has lost several pounds of weight and is following her new diet on arrival blood pressure 135/86.  The patient maintains Jardiance and Trulicity for diabetes  Wt Readings from Last 3 Encounters: 04/07/20 : 222 lb 9.6 oz (101 kg) 04/05/20 : 222 lb  (100.7 kg) 02/25/20 : 226 lb (102.5 kg)    Past Medical History:  Diagnosis Date  . Anemia   . Coronary artery disease   . Diabetes mellitus without complication ([place])   . Excessive or frequent menstruation 09/10/2012  . Hypertension   . NSTEMI (non-ST elevated myocardial infarction) ([place]) 10/30/2019     Family History  Problem Relation Age of Onset  . Breast cancer Mother   . Hypertension Mother   . COPD Other   . Hypertension Other   . Diabetes Other   . Cancer Other   . Heart failure Maternal Aunt      Social History   Socioeconomic History  . Marital status: Married    Spouse name: Jerald Kief  . Number of children: 2  . Years of education: Not on file  . Highest education level: Some college, no degree  Occupational History  . 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 Smoking status: Never Smoker  . Smokeless tobacco: Never Used  Vaping Use  . Vaping Use: Never used  Substance and Sexual Activity  . Alcohol use: No  . Drug use: No  . Sexual activity: Not on file  Other Topics Concern  . Not on file  Social History Narrative   Lives locally.  Does not routinely exercise.  In school @ [employer] for nsg.   Social Determinants of Radio broadcast assistant  Strain:   . Difficulty of Paying Living Expenses: Not on file  Food Insecurity:   . Worried About Charity fundraiser in the Last Year: Not on file  . Ran Out of Food in the Last Year: Not on file  Transportation Needs:   . Lack of Transportation (Medical): Not on file  . Lack of Transportation (Non-Medical): Not on file  Physical Activity:   . Days of Exercise per Week: Not on file  . Minutes of Exercise per Session: Not on file  Stress:   . Feeling of Stress : Not on file  Social Connections:   . Frequency of Communication with Friends and Family: Not on file  . Frequency of Social Gatherings with Friends and Family: Not on file  . Attends Religious  Services: Not on file  . Active Member of Clubs or Organizations: Not on file  . Attends Archivist Meetings: Not on file  . Marital Status: Not on file  Intimate Partner Violence:   . Fear of Current or Ex-Partner: Not on file  . Emotionally Abused: Not on file  . Physically Abused: Not on file  . Sexually Abused: Not on file     Allergies  Allergen Reactions  . Amlodipine Hives  . Hydrocodone-Acetaminophen Hives and Itching    And itching Itching and hives  . Losartan Potassium-Hctz Other (See Comments)    Dizziness  . Tetanus Toxoids     Arm swelling size of a softball.      Outpatient Medications Prior to Visit  Medication Sig Dispense Refill  . aspirin 81 MG EC tablet Take 1 tablet (81 mg total) by mouth daily. 90 tablet 3  . atorvastatin (LIPITOR) 40 MG tablet Take 1 tablet (40 mg total) by mouth daily. 90 tablet 3  . Blood Glucose Monitoring Suppl (TRUE METRIX METER) w/Device KIT Use to check blood sugars twice per day (Patient taking differently: 1 each by Other route See admin instructions. Use to check blood sugars twice per day) 1 kit 0  . carvedilol (COREG) 6.25 MG tablet Take 1 tablet (6.25 mg total) by mouth 2 (two) times daily with a meal. 60 tablet 6  . Dulaglutide (TRULICITY) 2.64 BR/8.3EN SOPN Inject 0.75 mg into the skin once a week. 2 mL 4  . glucose blood (TRUE METRIX BLOOD GLUCOSE TEST) test strip Use as instructed to check BS twice per day (Patient taking differently: 1 each by Other route See admin instructions. Use as instructed to check BS twice per day) 100 each 12  . ibuprofen (ADVIL) 600 MG tablet Take 1 tablet (600 mg total) by mouth every 6 (six) hours as needed. (Patient taking differently: Take 600 mg by mouth every 6 (six) hours as needed for mild pain. ) 30 tablet 0  . Multiple Vitamin (MULTIVITAMIN WITH MINERALS) TABS tablet Take 1 tablet by mouth daily.    . nitroGLYCERIN (NITROSTAT) 0.4 MG SL tablet Place 1 tablet (0.4 mg total) under  the tongue every 5 (five) minutes x 3 doses as needed for chest pain. 25 tablet 12  . ondansetron (ZOFRAN ODT) 4 MG disintegrating tablet 78m ODT q4 hours prn nausea/vomit (Patient taking differently: Take 4 mg by mouth every 4 (four) hours as needed for nausea or vomiting. ) 12 tablet 0  . TRUEplus Lancets 28G MISC Use when checking BS twice per day (Patient taking differently: 1 each by Other route See admin instructions. Use when checking BS twice per day) 100 each 11  .  valsartan (DIOVAN) 80 MG tablet Take 1 tablet (80 mg total) by mouth daily. 30 tablet 6  . empagliflozin (JARDIANCE) 10 MG TABS tablet Take 1 tablet (10 mg total) by mouth daily. 30 tablet 3   No facility-administered medications prior to visit.      Review of Systems  Constitutional: Positive for appetite change.  HENT: Negative.   Respiratory: Negative.   Cardiovascular: Negative for chest pain.  Gastrointestinal: Negative.   Endocrine: Negative for polyphagia and polyuria.  Genitourinary: Negative.   Musculoskeletal: Negative.   Neurological: Negative.   Hematological: Negative.   Psychiatric/Behavioral: Negative.        Objective:   Physical Exam Vitals:   04/07/20 1021  BP: 135/86  Pulse: 67  Resp: 18  Temp: 98 F (36.7 C)  TempSrc: Temporal  SpO2: 99%  Weight: 222 lb 9.6 oz (101 kg)  Height: 5' 2"  (1.575 m)    Gen: Pleasant, obese, in no distress,  normal affect  ENT: Poor dentition mouth clear,  oropharynx clear, no postnasal drip  Neck: No JVD, no TMG, no carotid bruits  Lungs: No use of accessory muscles, no dullness to percussion, clear without rales or rhonchi  Cardiovascular: RRR, heart sounds normal, no murmur or gallops, no peripheral edema  Abdomen: soft and NT, no HSM,  BS normal  Musculoskeletal: No deformities, no cyanosis or clubbing The right wrist at the radial nerve site as it enters into the carpal tunnel is quite tender to palpation however the grip of the right hand is  solid  Neuro: alert, non focal  Skin: Warm, no lesions or rashes         Assessment & Plan:  I personally reviewed all images and lab data in the [facility] system as well as any outside material available during this office visit and agree with the  radiology impressions.   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  A Pneumovax 23 valent was given

## 2020-04-07 NOTE — Assessment & Plan Note (Signed)
Type 2 diabetes with improved control A1c down to 6.3 continue Jardiance and Trulicity

## 2020-04-07 NOTE — Assessment & Plan Note (Signed)
I believe what is occurred here is that the patient has had an injury to her radial nerve after the radial artery catheterization was performed  An imaging study would be important however she does not have any insurance I am reluctant to order an MRI at this point  I asked the patient to keep a wrist splint on at night and we will see how the patient does and follow-up

## 2020-05-05 MED FILL — !TRULICITY 0.75 MG/0.5 ML P: 0.75 | 28 days supply | Qty: 2 | Fill #2

## 2020-06-02 MED FILL — VALSARTAN 80 MG TABLET: 80 | 30 days supply | Qty: 30 | Fill #2

## 2020-06-02 MED FILL — CARVEDILOL 6.25 MG TABLET: 6.25 | 15 days supply | Qty: 30 | Fill #3

## 2020-06-02 MED FILL — JARDIANCE 10 MG TABLET: 10 | 30 days supply | Qty: 30 | Fill #2

## 2020-06-16 MED FILL — VALSARTAN 80 MG TABLET: 80 | 30 days supply | Qty: 30 | Fill #2

## 2020-06-16 MED FILL — ?TRULICITY 0.75 MG/0.5ML PE: 0.75 | 28 days supply | Qty: 2 | Fill #3

## 2020-06-16 MED FILL — JARDIANCE 10 MG TABLET: 10 | 30 days supply | Qty: 30 | Fill #2

## 2020-06-16 MED FILL — CARVEDILOL 6.25 MG TABLET: 6.25 | 15 days supply | Qty: 30 | Fill #3

## 2020-06-20 MED FILL — ?ATORVASTATIN 40MG TABLET: 40 | 30 days supply | Qty: 30 | Fill #1

## 2020-08-05 ENCOUNTER — Ambulatory Visit: Payer: Self-pay | Admitting: Family Medicine

## 2020-08-27 ENCOUNTER — Other Ambulatory Visit: Payer: Self-pay

## 2020-08-31 ENCOUNTER — Ambulatory Visit: Payer: Self-pay | Admitting: Critical Care Medicine

## 2020-10-19 ENCOUNTER — Other Ambulatory Visit: Payer: Self-pay

## 2020-10-19 ENCOUNTER — Ambulatory Visit: Payer: 59 | Attending: Critical Care Medicine | Admitting: Nurse Practitioner

## 2020-10-19 VITALS — BP 158/98 | HR 94 | Ht 62.0 in | Wt 226.4 lb

## 2020-10-19 DIAGNOSIS — I10 Essential (primary) hypertension: Secondary | ICD-10-CM

## 2020-10-19 DIAGNOSIS — E119 Type 2 diabetes mellitus without complications: Secondary | ICD-10-CM | POA: Diagnosis not present

## 2020-10-19 LAB — POCT GLYCOSYLATED HEMOGLOBIN (HGB A1C): Hemoglobin A1C: 7.4 % — AB (ref 4.0–5.6)

## 2020-10-19 LAB — GLUCOSE, POCT (MANUAL RESULT ENTRY): POC Glucose: 182 mg/dl — AB (ref 70–99)

## 2020-10-19 MED ORDER — DULAGLUTIDE 0.75 MG/0.5ML ~~LOC~~ SOAJ
0.7500 mg | SUBCUTANEOUS | 4 refills | Status: DC
  Filled 2020-10-19: qty 2, 28d supply, fill #0

## 2020-10-19 MED ORDER — CARVEDILOL 6.25 MG PO TABS
ORAL_TABLET | Freq: Two times a day (BID) | ORAL | 6 refills | Status: DC
  Filled 2020-10-19: qty 60, 30d supply, fill #0

## 2020-10-19 MED ORDER — VALSARTAN 80 MG PO TABS
ORAL_TABLET | Freq: Every day | ORAL | 6 refills | Status: DC
  Filled 2020-10-19: qty 30, 30d supply, fill #0

## 2020-10-19 MED ORDER — ATORVASTATIN CALCIUM 40 MG PO TABS
ORAL_TABLET | Freq: Every day | ORAL | 3 refills | Status: DC
  Filled 2020-10-19: qty 30, 30d supply, fill #0

## 2020-10-19 MED ORDER — EMPAGLIFLOZIN 10 MG PO TABS
10.0000 mg | ORAL_TABLET | Freq: Every day | ORAL | 3 refills | Status: DC
  Filled 2020-10-19: qty 30, 30d supply, fill #0

## 2020-10-19 NOTE — Patient Instructions (Addendum)
Essential hypertension Blood pressure elevated today - Patient is currently out of medications - will reorder. Continue current medications - Will call patient this afternoon for a report on BP after taking medications  Type 2 diabetes mellitus without complication, without long-term current use of insulin (HCC) Type 2 diabetes withl A1c today of 7.4 compared to 6.3 from last check - continue Jardiance and Trulicity- will refill  Class 3 severe obesity due to excess calories with serious comorbidity and body mass index (BMI) of 40.0 to 44.9 in adult [facility]) Patient given diet instruction  Will check blood work today - patient is fasting  Follow up:  Follow up with Dr. Delford Field in 3 months   https://www.diabeteseducator.org/docs/default-source/living-with-diabetes/conquering-the-grocery-store-v1.pdf?sfvrsn=4">  Carbohydrate Counting for Diabetes Mellitus, Adult Carbohydrate counting is a method of keeping track of how many carbohydrates you eat. Eating carbohydrates naturally increases the amount of sugar (glucose) in the blood. Counting how many carbohydrates you eat improves your blood glucose control, which helps you manage your diabetes. It is important to know how many carbohydrates you can safely have in each meal. This is different for every person. A dietitian can help you make a meal plan and calculate how many carbohydrates you should have at each meal and snack. What foods contain carbohydrates? Carbohydrates are found in the following foods:  Grains, such as breads and cereals.  Dried beans and soy products.  Starchy vegetables, such as potatoes, peas, and corn.  Fruit and fruit juices.  Milk and yogurt.  Sweets and snack foods, such as cake, cookies, candy, chips, and soft drinks.   How do I count carbohydrates in foods? There are two ways to count carbohydrates in food. You can read food labels or learn standard serving sizes of foods. You can use either of the methods  or a combination of both. Using the Nutrition Facts label The Nutrition Facts list is included on the labels of almost all packaged foods and beverages in the [place] It includes:  The serving size.  Information about nutrients in each serving, including the grams (g) of carbohydrate per serving. To use the Nutrition Facts:  Decide how many servings you will have.  Multiply the number of servings by the number of carbohydrates per serving.  The resulting number is the total amount of carbohydrates that you will be having. Learning the standard serving sizes of foods When you eat carbohydrate foods that are not packaged or do not include Nutrition Facts on the label, you need to measure the servings in order to count the amount of carbohydrates.  Measure the foods that you will eat with a food scale or measuring cup, if needed.  Decide how many standard-size servings you will eat.  Multiply the number of servings by 15. For foods that contain carbohydrates, one serving equals 15 g of carbohydrates. ? For example, if you eat 2 cups or 10 oz (300 g) of strawberries, you will have eaten 2 servings and 30 g of carbohydrates (2 servings x 15 g = 30 g).  For foods that have more than one food mixed, such as soups and casseroles, you must count the carbohydrates in each food that is included. The following list contains standard serving sizes of common carbohydrate-rich foods. Each of these servings has about 15 g of carbohydrates:  1 slice of bread.  1 six-inch (15 cm) tortilla.  ? cup or 2 oz (53 g) cooked rice or pasta.   cup or 3 oz (85 g) cooked or canned, drained  and rinsed beans or lentils.   cup or 3 oz (85 g) starchy vegetable, such as peas, corn, or squash.   cup or 4 oz (120 g) hot cereal.   cup or 3 oz (85 g) boiled or mashed potatoes, or  or 3 oz (85 g) of a large baked potato.   cup or 4 fl oz (118 mL) fruit juice.  1 cup or 8 fl oz (237 mL) milk.  1 small or 4  oz (106 g) apple.   or 2 oz (63 g) of a medium banana.  1 cup or 5 oz (150 g) strawberries.  3 cups or 1 oz (24 g) popped popcorn. What is an example of carbohydrate counting? To calculate the number of carbohydrates in this sample meal, follow the steps shown below. Sample meal  3 oz (85 g) chicken breast.  ? cup or 4 oz (106 g) brown rice.   cup or 3 oz (85 g) corn.  1 cup or 8 fl oz (237 mL) milk.  1 cup or 5 oz (150 g) strawberries with sugar-free whipped topping. Carbohydrate calculation 1. Identify the foods that contain carbohydrates: ? Rice. ? Corn. ? Milk. ? Strawberries. 2. Calculate how many servings you have of each food: ? 2 servings rice. ? 1 serving corn. ? 1 serving milk. ? 1 serving strawberries. 3. Multiply each number of servings by 15 g: ? 2 servings rice x 15 g = 30 g. ? 1 serving corn x 15 g = 15 g. ? 1 serving milk x 15 g = 15 g. ? 1 serving strawberries x 15 g = 15 g. 4. Add together all of the amounts to find the total grams of carbohydrates eaten: ? 30 g + 15 g + 15 g + 15 g = 75 g of carbohydrates total. What are tips for following this plan? Shopping  Develop a meal plan and then make a shopping list.  Buy fresh and frozen vegetables, fresh and frozen fruit, dairy, eggs, beans, lentils, and whole grains.  Look at food labels. Choose foods that have more fiber and less sugar.  Avoid processed foods and foods with added sugars. Meal planning  Aim to have the same amount of carbohydrates at each meal and for each snack time.  Plan to have regular, balanced meals and snacks. Where to find more information  American Diabetes Association: www.diabetes.org  Centers for Disease Control and Prevention: [employer]Exhibition.com.br Summary  Carbohydrate counting is a method of keeping track of how many carbohydrates you eat.  Eating carbohydrates naturally increases the amount of sugar (glucose) in the blood.  Counting how many carbohydrates you eat  improves your blood glucose control, which helps you manage your diabetes.  A dietitian can help you make a meal plan and calculate how many carbohydrates you should have at each meal and snack. This information is not intended to replace advice given to you by your health care provider. Make sure you discuss any questions you have with your health care provider. Document Revised: 05/14/2019 Document Reviewed: 05/15/2019 Elsevier Patient Education  2021 [employer].

## 2020-10-19 NOTE — Assessment & Plan Note (Signed)
Essential hypertension Blood pressure elevated today - Patient is currently out of medications - will reorder. Continue current medications - Will call patient this afternoon for a report on BP after taking medications  Type 2 diabetes mellitus without complication, without long-term current use of insulin (HCC) Type 2 diabetes withl A1c today of 7.4 compared to 6.3 from last check - continue Jardiance and Trulicity- will refill  Class 3 severe obesity due to excess calories with serious comorbidity and body mass index (BMI) of 40.0 to 44.9 in adult [facility]) Patient given diet instruction  Will check blood work today - patient is fasting  Follow up:  Follow up with Dr. Delford Field in 3 months

## 2020-10-19 NOTE — Progress Notes (Signed)
@Patient  ID: Amy [name], female    DOB: 11/10/1975, 45 y.o.   MRN: [id number]  Chief Complaint  Patient presents with  . Diabetes    Referring provider: No ref. provider found   45 year old female with a history of hypertension, CAD, diabetes, obesity.   HPI   Patient presents today for follow-up visit.  This is a patient of Dr. Joya Gaskins.  Patient was last seen by Dr. Joya Gaskins for routine follow-up on 04/07/2020.  Patient states that she has completely run out of her medications as of 3 days ago.  She states that she has been checking her blood pressures at home and usually her blood pressure is around 130/80.  Her blood pressure was noted to be elevated in office today but she has not had her medications in a few days.  Patient is fasting today for blood work.  Patient states that she did have COVID 3 weeks ago.  She states that she has recuperated at this point.  States that overall she feels well.  We did discuss better control of her diet with diabetes.  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  Denies f/c/s, n/v/d, hemoptysis, PND, chest pain or edema.    Allergies  Allergen Reactions  . Amlodipine Hives  . Hydrocodone-Acetaminophen Hives and Itching    And itching Itching and hives  . Losartan Potassium-Hctz Other (See Comments)    Dizziness  . Tetanus Toxoids     Arm swelling size of a softball.     Immunization History  Administered Date(s) Administered  . Influenza,inj,Quad PF,6+ Mos 02/25/2020  . Janssen (J&J) SARS-COV-2 Vaccination 09/07/2019  . PPD Test 01/25/2011, 07/28/2019  . Pneumococcal Polysaccharide-23 04/07/2020  . Tdap 10/13/2019    Past Medical History:  Diagnosis Date  . Anemia   . Coronary artery disease   . Diabetes mellitus without complication ([place])   . Excessive or frequent menstruation 09/10/2012  . Hypertension   . NSTEMI (non-ST elevated myocardial infarction) ([place]) 10/30/2019    Tobacco  History: Social History   Tobacco Use  Smoking Status Never Smoker  Smokeless Tobacco Never Used   Counseling given: Yes   Outpatient Encounter Medications as of 10/19/2020  Medication Sig  . albuterol (VENTOLIN HFA) 108 (90 Base) MCG/ACT inhaler SMARTSIG:2 Puff(s) By Mouth 4 Times Daily  . aspirin 81 MG EC tablet Take 1 tablet (81 mg total) by mouth daily.  . benzonatate (TESSALON) 100 MG capsule Take 100 mg by mouth 3 (three) times daily.  . Blood Glucose Monitoring Suppl (TRUE METRIX METER) w/Device KIT Use to check blood sugars twice per day (Patient taking differently: 1 each by Other route See admin instructions. Use to check blood sugars twice per day)  . glucose blood (TRUE METRIX BLOOD GLUCOSE TEST) test strip Use as instructed to check BS twice per day (Patient taking differently: 1 each by Other route See admin instructions. Use as instructed to check BS twice per day)  . ibuprofen (ADVIL) 600 MG tablet Take 1 tablet (600 mg total) by mouth every 6 (six) hours as needed. (Patient taking differently: Take 600 mg by mouth every 6 (six) hours as needed for mild pain.)  . methylPREDNISolone (MEDROL DOSEPAK) 4 MG TBPK tablet Take by mouth as directed.  . Multiple Vitamin (MULTIVITAMIN WITH MINERALS) TABS tablet Take 1 tablet by mouth daily.  . nitroGLYCERIN (NITROSTAT) 0.4 MG SL tablet Place 1 tablet (0.4 mg total) under the tongue every 5 (  five) minutes x 3 doses as needed for chest pain.  Marland Kitchen ondansetron (ZOFRAN ODT) 4 MG disintegrating tablet 54m ODT q4 hours prn nausea/vomit (Patient taking differently: Take 4 mg by mouth every 4 (four) hours as needed for nausea or vomiting.)  . TRUEplus Lancets 28G MISC Use when checking BS twice per day (Patient taking differently: 1 each by Other route See admin instructions. Use when checking BS twice per day)  . [DISCONTINUED] atorvastatin (LIPITOR) 40 MG tablet TAKE 1 TABLET (40 MG TOTAL) BY MOUTH DAILY.  . [DISCONTINUED] carvedilol (COREG) 6.25  MG tablet TAKE 1 TABLET (6.25 MG TOTAL) BY MOUTH 2 (TWO) TIMES DAILY WITH A MEAL.  . [DISCONTINUED] Dulaglutide 0.75 MG/0.5ML SOPN INJECT 0.75 MG INTO THE SKIN ONCE A WEEK.  . [DISCONTINUED] empagliflozin (JARDIANCE) 10 MG TABS tablet Take 1 tablet (10 mg total) by mouth daily.  . [DISCONTINUED] empagliflozin (JARDIANCE) 10 MG TABS tablet TAKE 1 TABLET (10 MG TOTAL) BY MOUTH DAILY.  . [DISCONTINUED] valsartan (DIOVAN) 80 MG tablet TAKE 1 TABLET (80 MG TOTAL) BY MOUTH DAILY.  .Marland Kitchenatorvastatin (LIPITOR) 40 MG tablet TAKE 1 TABLET (40 MG TOTAL) BY MOUTH DAILY.  . carvedilol (COREG) 6.25 MG tablet TAKE 1 TABLET (6.25 MG TOTAL) BY MOUTH 2 (TWO) TIMES DAILY WITH A MEAL.  . Dulaglutide 0.75 MG/0.5ML SOPN INJECT 0.75 MG INTO THE SKIN ONCE A WEEK.  . empagliflozin (JARDIANCE) 10 MG TABS tablet Take 1 tablet (10 mg total) by mouth daily.  . valsartan (DIOVAN) 80 MG tablet TAKE 1 TABLET (80 MG TOTAL) BY MOUTH DAILY.   No facility-administered encounter medications on file as of 10/19/2020.     Review of Systems  Review of Systems  Constitutional: Negative.  Negative for fatigue and fever.  HENT: Negative.   Respiratory: Negative for cough and shortness of breath.   Cardiovascular: Negative.  Negative for chest pain, palpitations and leg swelling.  Gastrointestinal: Negative.   Allergic/Immunologic: Negative.   Neurological: Negative.   Psychiatric/Behavioral: Negative.        Physical Exam  BP (!) 158/98   Pulse 94   Ht 5' 2"  (1.575 m)   Wt 226 lb 6.4 oz (102.7 kg)   SpO2 99%   BMI 41.41 kg/m   Wt Readings from Last 5 Encounters:  10/19/20 226 lb 6.4 oz (102.7 kg)  04/07/20 222 lb 9.6 oz (101 kg)  04/05/20 222 lb (100.7 kg)  02/25/20 226 lb (102.5 kg)  01/04/20 232 lb (105.2 kg)     Physical Exam Vitals and nursing note reviewed.  Constitutional:      General: She is not in acute distress.    Appearance: She is well-developed.  Cardiovascular:     Rate and Rhythm: Normal rate  and regular rhythm.  Pulmonary:     Effort: Pulmonary effort is normal.     Breath sounds: Normal breath sounds.  Neurological:     Mental Status: She is alert and oriented to person, place, and time.        Assessment & Plan:   Essential hypertension Essential hypertension Blood pressure elevated today - Patient is currently out of medications - will reorder. Continue current medications - Will call patient this afternoon for a report on BP after taking medications  Type 2 diabetes mellitus without complication, without long-term current use of insulin (HCC) Type 2 diabetes withl A1c today of 7.4 compared to 6.3 from last check - continue Jardiance and Trulicity- will refill  Class 3 severe obesity due to  excess calories with serious comorbidity and body mass index (BMI) of 40.0 to 44.9 in adult [facility]) Patient given diet instruction  Will check blood work today - patient is fasting  Follow up:  Follow up with Dr. Joya Gaskins in 3 months    Patient Instructions   Essential hypertension Blood pressure elevated today - Patient is currently out of medications - will reorder. Continue current medications - Will call patient this afternoon for a report on BP after taking medications  Type 2 diabetes mellitus without complication, without long-term current use of insulin (HCC) Type 2 diabetes withl A1c today of 7.4 compared to 6.3 from last check - continue Jardiance and Trulicity- will refill  Class 3 severe obesity due to excess calories with serious comorbidity and body mass index (BMI) of 40.0 to 44.9 in adult [facility]) Patient given diet instruction  Will check blood work today - patient is fasting  Follow up:  Follow up with Dr. Joya Gaskins in 3 months   https://www.diabeteseducator.org/docs/default-source/living-with-diabetes/conquering-the-grocery-store-v1.pdf?sfvrsn=4">  Carbohydrate Counting for Diabetes Mellitus, Adult Carbohydrate counting is a method of keeping track  of how many carbohydrates you eat. Eating carbohydrates naturally increases the amount of sugar (glucose) in the blood. Counting how many carbohydrates you eat improves your blood glucose control, which helps you manage your diabetes. It is important to know how many carbohydrates you can safely have in each meal. This is different for every person. A dietitian can help you make a meal plan and calculate how many carbohydrates you should have at each meal and snack. What foods contain carbohydrates? Carbohydrates are found in the following foods:  Grains, such as breads and cereals.  Dried beans and soy products.  Starchy vegetables, such as potatoes, peas, and corn.  Fruit and fruit juices.  Milk and yogurt.  Sweets and snack foods, such as cake, cookies, candy, chips, and soft drinks.   How do I count carbohydrates in foods? There are two ways to count carbohydrates in food. You can read food labels or learn standard serving sizes of foods. You can use either of the methods or a combination of both. Using the Nutrition Facts label The Nutrition Facts list is included on the labels of almost all packaged foods and beverages in the [place] It includes:  The serving size.  Information about nutrients in each serving, including the grams (g) of carbohydrate per serving. To use the Nutrition Facts:  Decide how many servings you will have.  Multiply the number of servings by the number of carbohydrates per serving.  The resulting number is the total amount of carbohydrates that you will be having. Learning the standard serving sizes of foods When you eat carbohydrate foods that are not packaged or do not include Nutrition Facts on the label, you need to measure the servings in order to count the amount of carbohydrates.  Measure the foods that you will eat with a food scale or measuring cup, if needed.  Decide how many standard-size servings you will eat.  Multiply the number of  servings by 15. For foods that contain carbohydrates, one serving equals 15 g of carbohydrates. ? For example, if you eat 2 cups or 10 oz (300 g) of strawberries, you will have eaten 2 servings and 30 g of carbohydrates (2 servings x 15 g = 30 g).  For foods that have more than one food mixed, such as soups and casseroles, you must count the carbohydrates in each food that is included. The following list contains standard  serving sizes of common carbohydrate-rich foods. Each of these servings has about 15 g of carbohydrates:  1 slice of bread.  1 six-inch (15 cm) tortilla.  ? cup or 2 oz (53 g) cooked rice or pasta.   cup or 3 oz (85 g) cooked or canned, drained and rinsed beans or lentils.   cup or 3 oz (85 g) starchy vegetable, such as peas, corn, or squash.   cup or 4 oz (120 g) hot cereal.   cup or 3 oz (85 g) boiled or mashed potatoes, or  or 3 oz (85 g) of a large baked potato.   cup or 4 fl oz (118 mL) fruit juice.  1 cup or 8 fl oz (237 mL) milk.  1 small or 4 oz (106 g) apple.   or 2 oz (63 g) of a medium banana.  1 cup or 5 oz (150 g) strawberries.  3 cups or 1 oz (24 g) popped popcorn. What is an example of carbohydrate counting? To calculate the number of carbohydrates in this sample meal, follow the steps shown below. Sample meal  3 oz (85 g) chicken breast.  ? cup or 4 oz (106 g) brown rice.   cup or 3 oz (85 g) corn.  1 cup or 8 fl oz (237 mL) milk.  1 cup or 5 oz (150 g) strawberries with sugar-free whipped topping. Carbohydrate calculation 1. Identify the foods that contain carbohydrates: ? Rice. ? Corn. ? Milk. ? Strawberries. 2. Calculate how many servings you have of each food: ? 2 servings rice. ? 1 serving corn. ? 1 serving milk. ? 1 serving strawberries. 3. Multiply each number of servings by 15 g: ? 2 servings rice x 15 g = 30 g. ? 1 serving corn x 15 g = 15 g. ? 1 serving milk x 15 g = 15 g. ? 1 serving strawberries x 15 g =  15 g. 4. Add together all of the amounts to find the total grams of carbohydrates eaten: ? 30 g + 15 g + 15 g + 15 g = 75 g of carbohydrates total. What are tips for following this plan? Shopping  Develop a meal plan and then make a shopping list.  Buy fresh and frozen vegetables, fresh and frozen fruit, dairy, eggs, beans, lentils, and whole grains.  Look at food labels. Choose foods that have more fiber and less sugar.  Avoid processed foods and foods with added sugars. Meal planning  Aim to have the same amount of carbohydrates at each meal and for each snack time.  Plan to have regular, balanced meals and snacks. Where to find more information  American Diabetes Association: www.diabetes.org  Centers for Disease Control and Prevention: http://www.wolf.info/ Summary  Carbohydrate counting is a method of keeping track of how many carbohydrates you eat.  Eating carbohydrates naturally increases the amount of sugar (glucose) in the blood.  Counting how many carbohydrates you eat improves your blood glucose control, which helps you manage your diabetes.  A dietitian can help you make a meal plan and calculate how many carbohydrates you should have at each meal and snack. This information is not intended to replace advice given to you by your health care provider. Make sure you discuss any questions you have with your health care provider. Document Revised: 05/14/2019 Document Reviewed: 05/15/2019 Elsevier Patient Education  2021 [place], [place] 10/19/2020

## 2020-10-20 LAB — COMPREHENSIVE METABOLIC PANEL
ALT: 13 IU/L (ref 0–32)
AST: 13 IU/L (ref 0–40)
Albumin/Globulin Ratio: 0.8 — ABNORMAL LOW (ref 1.2–2.2)
Albumin: 3.4 g/dL — ABNORMAL LOW (ref 3.8–4.8)
Alkaline Phosphatase: 86 IU/L (ref 44–121)
BUN/Creatinine Ratio: 10 (ref 9–23)
BUN: 7 mg/dL (ref 6–24)
Bilirubin Total: 0.2 mg/dL (ref 0.0–1.2)
CO2: 25 mmol/L (ref 20–29)
Calcium: 9.1 mg/dL (ref 8.7–10.2)
Chloride: 104 mmol/L (ref 96–106)
Creatinine, Ser: 0.72 mg/dL (ref 0.57–1.00)
Globulin, Total: 4.1 g/dL (ref 1.5–4.5)
Glucose: 153 mg/dL — ABNORMAL HIGH (ref 65–99)
Potassium: 3.8 mmol/L (ref 3.5–5.2)
Sodium: 140 mmol/L (ref 134–144)
Total Protein: 7.5 g/dL (ref 6.0–8.5)
eGFR: 104 mL/min/{1.73_m2} (ref >59)

## 2020-10-20 LAB — LIPID PANEL
Chol/HDL Ratio: 3.8 ratio (ref 0.0–4.4)
Cholesterol, Total: 168 mg/dL (ref 100–199)
HDL: 44 mg/dL (ref >39)
LDL Chol Calc (NIH): 112 mg/dL — ABNORMAL HIGH (ref 0–99)
Triglycerides: 63 mg/dL (ref 0–149)
VLDL Cholesterol Cal: 12 mg/dL (ref 5–40)

## 2020-10-20 LAB — CBC
Hematocrit: 35.1 % (ref 34.0–46.6)
Hemoglobin: 11.3 g/dL (ref 11.1–15.9)
MCH: 27.6 pg (ref 26.6–33.0)
MCHC: 32.2 g/dL (ref 31.5–35.7)
MCV: 86 fL (ref 79–97)
Platelets: 310 10*3/uL (ref 150–450)
RBC: 4.1 x10E6/uL (ref 3.77–5.28)
RDW: 13.7 % (ref 11.7–15.4)
WBC: 5.8 10*3/uL (ref 3.4–10.8)

## 2021-01-16 ENCOUNTER — Encounter: Payer: Self-pay | Admitting: Critical Care Medicine

## 2021-01-16 ENCOUNTER — Ambulatory Visit: Payer: 59 | Attending: Critical Care Medicine | Admitting: Critical Care Medicine

## 2021-01-16 ENCOUNTER — Other Ambulatory Visit: Payer: Self-pay

## 2021-01-16 VITALS — BP 192/122 | HR 75 | Resp 16 | Wt 209.0 lb

## 2021-01-16 DIAGNOSIS — Z23 Encounter for immunization: Secondary | ICD-10-CM | POA: Diagnosis not present

## 2021-01-16 DIAGNOSIS — S6421XS Injury of radial nerve at wrist and hand level of right arm, sequela: Secondary | ICD-10-CM

## 2021-01-16 DIAGNOSIS — I10 Essential (primary) hypertension: Secondary | ICD-10-CM | POA: Diagnosis not present

## 2021-01-16 DIAGNOSIS — Z1211 Encounter for screening for malignant neoplasm of colon: Secondary | ICD-10-CM

## 2021-01-16 DIAGNOSIS — E119 Type 2 diabetes mellitus without complications: Secondary | ICD-10-CM | POA: Diagnosis not present

## 2021-01-16 LAB — GLUCOSE, POCT (MANUAL RESULT ENTRY): POC Glucose: 119 mg/dl — AB (ref 70–99)

## 2021-01-16 MED ORDER — EMPAGLIFLOZIN 10 MG PO TABS
10.0000 mg | ORAL_TABLET | Freq: Every day | ORAL | 3 refills | Status: DC
  Filled 2021-01-16: qty 30, 30d supply, fill #0

## 2021-01-16 MED ORDER — VALSARTAN 160 MG PO TABS
160.0000 mg | ORAL_TABLET | Freq: Every day | ORAL | 6 refills | Status: DC
  Filled 2021-01-16: qty 90, 90d supply, fill #0
  Filled 2021-01-16: qty 60, 60d supply, fill #0

## 2021-01-16 MED ORDER — ATORVASTATIN CALCIUM 40 MG PO TABS
ORAL_TABLET | Freq: Every day | ORAL | 3 refills | Status: DC
  Filled 2021-01-16: qty 90, 90d supply, fill #0

## 2021-01-16 MED ORDER — CARVEDILOL 12.5 MG PO TABS
12.5000 mg | ORAL_TABLET | Freq: Two times a day (BID) | ORAL | 4 refills | Status: DC
  Filled 2021-01-16: qty 60, 30d supply, fill #0

## 2021-01-16 MED ORDER — ASPIRIN 81 MG PO TBEC
81.0000 mg | DELAYED_RELEASE_TABLET | Freq: Every day | ORAL | 3 refills | Status: AC
  Filled 2021-01-16: qty 90, 90d supply, fill #0

## 2021-01-16 MED ORDER — DULAGLUTIDE 0.75 MG/0.5ML ~~LOC~~ SOAJ
0.7500 mg | SUBCUTANEOUS | 4 refills | Status: DC
Start: 2021-01-16 — End: 2021-05-01
  Filled 2021-01-16: qty 2, 28d supply, fill #0

## 2021-01-16 NOTE — Patient Instructions (Addendum)
Increase valsartan to 160 mg daily, increase Coreg to 12.5 mg twice daily, refill sent to our pharmacy  Stay on Trulicity weekly and going to ask what the issue is at our pharmacy on the Trulicity we may need to substitute this based on your drug plan if your insurance  Stay on Jardiance daily refill sent to our pharmacy  Stay on aspirin daily 81 mg and atorvastatin was also refilled  Referral to neurology for your radial nerve right arm pain was sent  Referral to gastroenterology for colonoscopy was sent  Pneumonia vaccine was given this visit  Follow a Dash diet as attached to reduce his salt intake in your diet  Return to see Dr. Delford Field in 2 months

## 2021-01-16 NOTE — Assessment & Plan Note (Signed)
Patient now has insurance we will refer to neurology for further evaluation

## 2021-01-16 NOTE — Assessment & Plan Note (Signed)
Blood pressure not well controlled will increase valsartan to 160 mg daily, increase Coreg to 12.5 mg twice daily.  Plan to follow-up short-term in the clinical pharmacy clinic as well as my clinic

## 2021-01-16 NOTE — Assessment & Plan Note (Signed)
Plan to continue Trulicity and Jardiance as prescribed

## 2021-01-16 NOTE — Progress Notes (Signed)
Established Patient Office Visit  Subjective:  Patient ID: Amy [name], female    DOB: 1976-05-23  Age: 45 y.o. MRN: [id number]  CC:  Chief Complaint  Patient presents with   Diabetes   Hypertension    HPI Amy [name] presents for PCP to est 45 year old female with a history of hypertension, CAD, diabetes, obesity. I had last seen this patient in 2021.  She had COVID illness in May of this year.  She did see the PA [facility] in May of this year to get back on her Trulicity.  She is in need of a colonoscopy and pneumonia vaccination.  On arrival blood pressure is elevated at 192/122 when rechecked it was 190/103 Patient states at home her blood pressures are in the 129/79 range.  Blood sugar on arrival is 49 Below is documentation from the prior note    HPI    Patient presents today for follow-up visit.  This is a patient of Dr. Joya Gaskins.  Patient was last seen by Dr. Joya Gaskins for routine follow-up on 04/07/2020.  Patient states that she has completely run out of her medications as of 3 days ago.  She states that she has been checking her blood pressures at home and usually her blood pressure is around 130/80.  Her blood pressure was noted to be elevated in office today but she has not had her medications in a few days.  Patient is fasting today for blood work.  Patient states that she did have COVID 3 weeks ago.  She states that she has recuperated at this point.  States that overall she feels well.  We did discuss better control of her diet with diabetes.  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  Denies f/c/s, n/v/d, hemoptysis, PND, chest pain or edema Essential hypertension Essential hypertension Blood pressure elevated today - Patient is currently out of medications - will reorder. Continue current medications - Will call patient this afternoon for a report on BP after taking medications   Type 2 diabetes mellitus without  complication, without long-term current use of insulin (HCC) Type 2 diabetes withl A1c today of 7.4 compared to 6.3 from last check - continue Jardiance and Trulicity- will refill   Class 3 severe obesity due to excess calories with serious comorbidity and body mass index (BMI) of 40.0 to 44.9 in adult [facility]) Patient given diet instruction  Patient has no other complaints other than chronic right upper extremity pain that appears to be left over from a radial artery catheterization.  There is a question of neuropathy in the radial artery in the right forearm   Past Medical History:  Diagnosis Date   Anemia    Coronary artery disease    Diabetes mellitus without complication ([place])    Excessive or frequent menstruation 09/10/2012   Hypertension    NSTEMI (non-ST elevated myocardial infarction) ([place]) 10/30/2019    Past Surgical History:  Procedure Laterality Date   CHOLECYSTECTOMY  2006   CORONARY ANGIOPLASTY     DILITATION & CURRETTAGE/HYSTROSCOPY WITH HYDROTHERMAL ABLATION N/A 07/10/2013   Procedure: DILATATION & CURETTAGE/HYSTEROSCOPY WITH HYDROTHERMAL ABLATION;  Surgeon: Shelly Bombard, MD;  Location: [place] [facility];  Service: Gynecology;  Laterality: N/A;   LEFT HEART CATH AND CORONARY ANGIOGRAPHY N/A 11/02/2019   Procedure: LEFT HEART CATH AND CORONARY ANGIOGRAPHY;  Surgeon: Sherren Mocha, MD;  Location: [place] [facility];  Service: Cardiovascular;  Laterality: N/A;   [place]  WISDOM TOOTH EXTRACTION  1998    Family History  Problem Relation Age of Onset   Breast cancer Mother    Hypertension Mother    COPD Other    Hypertension Other    Diabetes Other    Cancer Other    Heart failure Maternal Aunt     Social History   Socioeconomic History   Marital status: Married    Spouse name: Engineer, maintenance   Number of children: 2   Years of education: Not on file   Highest education level: Some college, no degree  Occupational History   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  Does not routinely exercise.  In school @ [employer] for nsg.   Social Determinants of Health   Financial Resource Strain: Not on file  Food Insecurity: Not on file  Transportation Needs: Not on file  Physical Activity: Not on file  Stress: Not on file  Social Connections: Not on file  Intimate Partner Violence: Not on file    Outpatient Medications Prior to Visit  Medication Sig Dispense Refill   Multiple Vitamin (MULTIVITAMIN WITH MINERALS) TABS tablet Take 1 tablet by mouth daily.     aspirin 81 MG EC tablet Take 1 tablet (81 mg total) by mouth daily. 90 tablet 3   atorvastatin (LIPITOR) 40 MG tablet TAKE 1 TABLET (40 MG TOTAL) BY MOUTH DAILY. 90 tablet 3   carvedilol (COREG) 6.25 MG tablet TAKE 1 TABLET (6.25 MG TOTAL) BY MOUTH 2 (TWO) TIMES DAILY WITH A MEAL. 30 tablet 6   empagliflozin (JARDIANCE) 10 MG TABS tablet Take 1 tablet (10 mg total) by mouth daily. 30 tablet 3   valsartan (DIOVAN) 80 MG tablet TAKE 1 TABLET (80 MG TOTAL) BY MOUTH DAILY. 30 tablet 6   albuterol (VENTOLIN HFA) 108 (90 Base) MCG/ACT inhaler SMARTSIG:2 Puff(s) By Mouth 4 Times Daily     Blood Glucose Monitoring Suppl (TRUE METRIX METER) w/Device KIT Use to check blood sugars twice per day (Patient taking differently: 1 each by Other route See admin instructions. Use to check blood sugars twice per day) 1 kit 0   glucose blood (TRUE METRIX BLOOD GLUCOSE TEST) test strip Use as instructed to check BS twice per day (Patient taking differently: 1 each by Other route See admin instructions. Use as instructed to check BS twice per day) 100 each 12   nitroGLYCERIN (NITROSTAT) 0.4 MG SL tablet Place 1  tablet (0.4 mg total) under the tongue every 5 (five) minutes x 3 doses as needed for chest pain. (Patient not taking: Reported on 01/16/2021) 25 tablet 12   TRUEplus Lancets 28G MISC Use when checking BS twice per day (Patient taking differently: 1 each by Other route See admin instructions. Use when checking BS twice per day) 100 each 11   benzonatate (TESSALON) 100 MG capsule Take 100 mg by mouth 3 (three) times daily.     Dulaglutide 0.75 MG/0.5ML SOPN INJECT 0.75 MG INTO THE SKIN ONCE A WEEK. (Patient not taking: Reported on 01/16/2021) 2 mL 4   ibuprofen (ADVIL) 600 MG tablet Take 1 tablet (600 mg  total) by mouth every 6 (six) hours as needed. (Patient taking differently: Take 600 mg by mouth every 6 (six) hours as needed for mild pain.) 30 tablet 0   methylPREDNISolone (MEDROL DOSEPAK) 4 MG TBPK tablet Take by mouth as directed.     ondansetron (ZOFRAN ODT) 4 MG disintegrating tablet 37m ODT q4 hours prn nausea/vomit (Patient taking differently: Take 4 mg by mouth every 4 (four) hours as needed for nausea or vomiting.) 12 tablet 0   No facility-administered medications prior to visit.    Allergies  Allergen Reactions   Amlodipine Hives   Hydrocodone-Acetaminophen Hives and Itching    And itching Itching and hives   Losartan Potassium-Hctz Other (See Comments)    Dizziness   Tetanus Toxoids     Arm swelling size of a softball.     ROS Review of Systems  HENT: Negative.    Eyes: Negative.   Respiratory: Negative.    Cardiovascular: Negative.   Gastrointestinal: Negative.   Genitourinary: Negative.   Musculoskeletal: Negative.   Neurological: Negative.   Psychiatric/Behavioral: Negative.       Objective:    Physical Exam Vitals:   01/16/21 1110  BP: (!) 192/122  Pulse: 75  Resp: 16  SpO2: 99%  Weight: 209 lb (94.8 kg)    Gen: Pleasant, well-nourished, in no distress,  normal affect  ENT: No lesions,  mouth clear,  oropharynx clear, no postnasal drip  Neck: No  JVD, no TMG, no carotid bruits  Lungs: No use of accessory muscles, no dullness to percussion, clear without rales or rhonchi  Cardiovascular: RRR, heart sounds normal, no murmur or gallops, no peripheral edema  Abdomen: soft and NT, no HSM,  BS normal  Musculoskeletal: No deformities, no cyanosis or clubbing  Neuro: alert, non focal  Skin: Warm, no lesions or rashes   BP (!) 192/122   Pulse 75   Resp 16   Wt 209 lb (94.8 kg)   SpO2 99%   BMI 38.23 kg/m  Wt Readings from Last 3 Encounters:  01/16/21 209 lb (94.8 kg)  10/19/20 226 lb 6.4 oz (102.7 kg)  04/07/20 222 lb 9.6 oz (101 kg)     Health Maintenance Due  Topic Date Due   COLONOSCOPY (Pts 45-422yrInsurance coverage will need to be confirmed)  Never done   COVID-19 Vaccine (3 - Booster for [employer]) 03/30/2020   INFLUENZA VACCINE  12/26/2020   FOOT EXAM  01/03/2021   Pneumococcal Vaccine 0-57461ears old (2 - PCV) 04/07/2021    There are no preventive care reminders to display for this patient.  No results found for: TSH Lab Results  Component Value Date   WBC 5.8 10/19/2020   HGB 11.3 10/19/2020   HCT 35.1 10/19/2020   MCV 86 10/19/2020   PLT 310 10/19/2020   Lab Results  Component Value Date   NA 140 10/19/2020   K 3.8 10/19/2020   CO2 25 10/19/2020   GLUCOSE 153 (H) 10/19/2020   BUN 7 10/19/2020   CREATININE 0.72 10/19/2020   BILITOT 0.2 10/19/2020   ALKPHOS 86 10/19/2020   AST 13 10/19/2020   ALT 13 10/19/2020   PROT 7.5 10/19/2020   ALBUMIN 3.4 (L) 10/19/2020   CALCIUM 9.1 10/19/2020   ANIONGAP 10 11/02/2019   EGFR 104 10/19/2020   Lab Results  Component Value Date   CHOL 168 10/19/2020   Lab Results  Component Value Date   HDL 44 10/19/2020   Lab Results  Component  Value Date   LDLCALC 112 (H) 10/19/2020   Lab Results  Component Value Date   TRIG 63 10/19/2020   Lab Results  Component Value Date   CHOLHDL 3.8 10/19/2020   Lab Results  Component Value Date   HGBA1C  7.4 (A) 10/19/2020      Assessment & Plan:   Problem List Items Addressed This Visit       Cardiovascular and Mediastinum   Essential hypertension    Blood pressure not well controlled will increase valsartan to 160 mg daily, increase Coreg to 12.5 mg twice daily.  Plan to follow-up short-term in the clinical pharmacy clinic as well as my clinic      Relevant Medications   aspirin 81 MG EC tablet   atorvastatin (LIPITOR) 40 MG tablet   carvedilol (COREG) 12.5 MG tablet   valsartan (DIOVAN) 160 MG tablet     Endocrine   Type 2 diabetes mellitus without complication, without long-term current use of insulin (HCC) - Primary    Plan to continue Trulicity and Jardiance as prescribed      Relevant Medications   aspirin 81 MG EC tablet   atorvastatin (LIPITOR) 40 MG tablet   Dulaglutide 0.75 MG/0.5ML SOPN   empagliflozin (JARDIANCE) 10 MG TABS tablet   valsartan (DIOVAN) 160 MG tablet   Other Relevant Orders   POCT glucose (manual entry) (Completed)     Nervous and Auditory   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    Dispense:  90 tablet    Refill:  3   atorvastatin (LIPITOR) 40 MG tablet    Sig: TAKE 1 TABLET (40 MG TOTAL) BY MOUTH DAILY.    Dispense:  90 tablet    Refill:  3   carvedilol (COREG) 12.5 MG tablet    Sig: Take 1 tablet (12.5 mg total) by mouth 2 (two) times daily with a meal.    Dispense:  60 tablet    Refill:  4   Dulaglutide 0.75 MG/0.5ML SOPN    Sig:  INJECT 0.75 MG INTO THE SKIN ONCE A WEEK.    Dispense:  2 mL    Refill:  4    May need patient assistance or alternative on her aetna plan   empagliflozin (JARDIANCE) 10 MG TABS tablet    Sig: Take 1 tablet (10 mg total) by mouth daily.    Dispense:  30 tablet    Refill:  3   valsartan (DIOVAN) 160 MG tablet    Sig: Take 1 tablet (160 mg total) by mouth daily.    Dispense:  60 tablet    Refill:  6    Follow-up: No follow-ups on file.    Asencion Noble, MD

## 2021-01-17 ENCOUNTER — Other Ambulatory Visit: Payer: Self-pay

## 2021-01-18 ENCOUNTER — Encounter: Payer: Self-pay | Admitting: Neurology

## 2021-01-23 ENCOUNTER — Other Ambulatory Visit: Payer: Self-pay

## 2021-01-24 ENCOUNTER — Other Ambulatory Visit: Payer: Self-pay

## 2021-02-02 ENCOUNTER — Ambulatory Visit (INDEPENDENT_AMBULATORY_CARE_PROVIDER_SITE_OTHER): Payer: 59 | Admitting: Physician Assistant

## 2021-02-02 ENCOUNTER — Other Ambulatory Visit: Payer: Self-pay

## 2021-02-02 VITALS — BP 144/102 | HR 84 | Ht 62.0 in | Wt 213.6 lb

## 2021-02-02 DIAGNOSIS — I10 Essential (primary) hypertension: Secondary | ICD-10-CM | POA: Diagnosis not present

## 2021-02-02 DIAGNOSIS — E119 Type 2 diabetes mellitus without complications: Secondary | ICD-10-CM

## 2021-02-02 DIAGNOSIS — M79631 Pain in right forearm: Secondary | ICD-10-CM

## 2021-02-02 DIAGNOSIS — E785 Hyperlipidemia, unspecified: Secondary | ICD-10-CM

## 2021-02-02 DIAGNOSIS — I2510 Atherosclerotic heart disease of native coronary artery without angina pectoris: Secondary | ICD-10-CM | POA: Diagnosis not present

## 2021-02-02 NOTE — Progress Notes (Signed)
Cardiology Office Note:    Date:  02/04/2021   ID:  Amy [name], DOB 05-Jan-1976, MRN [id number]  PCP:  Elsie Stain, MD   [place] Providers Cardiologist:  Pixie Casino, MD     Referring MD: No ref. provider found   Chief Complaint  Patient presents with   Follow-up    Seen for Dr. Debara Pickett    History of Present Illness:    Amy [name] is a 45 y.o. female with a hx of hypertension, hyperlipidemia, DM2 and CAD.  Patient was admitted with NSTEMI in June 2021 with abnormal EKG demonstrating deep T wave inversion.  She underwent cardiac catheterization which showed only 50% intermediate vessel stenosis however there was catheter induced spasm.  She was diagnosed with vasospastic angina.  Patient was last seen by Dr. Debara Pickett in November 2021, her Lipitor was reduced to 40 mg daily.  She had persistent right forearm pain after cardiac catheterization however ultrasound did not show any evidence of vascular compromise.  She presents today for 24-monthfollow-up.  Patient presents today for follow-up.  She continues to have right forearm pain.  The right forearm pain is worse when pressing on the right radial artery site.  On physical exam: There is no bruit to suggest AV fistula.  She is wearing a right wrist brace.  She plans to see neurology at some point to see if her right wrist pain could be coming from her nerves.  She described as a occasional sharp shooting pain up the arm.  Otherwise she denies any significant chest discomfort or or shortness of breath recently.  Blood pressure is borderline elevated today, I recommend she continue to observe her blood pressure at home, if systolic blood pressure is persistently elevated above 140 mmHg, I would recommend increase carvedilol to 25 mg twice a day.    Past Medical History:  Diagnosis Date   Anemia    Coronary artery disease    Diabetes mellitus without complication ([place]    Excessive or frequent menstruation  09/10/2012   Hypertension    NSTEMI (non-ST elevated myocardial infarction) ([place] 10/30/2019    Past Surgical History:  Procedure Laterality Date   CHOLECYSTECTOMY  2006   CORONARY ANGIOPLASTY     DILITATION & CURRETTAGE/HYSTROSCOPY WITH HYDROTHERMAL ABLATION N/A 07/10/2013   Procedure: DILATATION & CURETTAGE/HYSTEROSCOPY WITH HYDROTHERMAL ABLATION;  Surgeon: CShelly Bombard MD;  [facility];  Service: Gynecology;  Laterality: N/A;   LEFT HEART CATH AND CORONARY ANGIOGRAPHY N/A 11/02/2019   Procedure: LEFT HEART CATH AND CORONARY ANGIOGRAPHY;  Surgeon: CSherren Mocha MD;  [facility];  Service: Cardiovascular;  Laterality: N/A;   TUBAL LIGATION  1997   WISDOM TOOTH EXTRACTION  1998    Current Medications: Current Meds  Medication Sig   aspirin 81 MG EC tablet Take 1 tablet (81 mg total) by mouth daily.   atorvastatin (LIPITOR) 40 MG tablet TAKE 1 TABLET (40 MG TOTAL) BY MOUTH DAILY.   Blood Glucose Monitoring Suppl (TRUE METRIX METER) w/Device KIT Use to check blood sugars twice per day   carvedilol (COREG) 12.5 MG tablet Take 1 tablet (12.5 mg total) by mouth 2 (two) times daily with a meal.   Dulaglutide 0.75 MG/0.5ML SOPN INJECT 0.75 MG INTO THE SKIN ONCE A WEEK.   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  Does not routinely exercise.  In school @ [employer] for nsg.   Social Determinants of Health   Financial Resource Strain: Not on file  Food Insecurity: Not on file  Transportation Needs: Not on file  Physical Activity: Not on file  Stress: Not on file  Social Connections: Not on file     Family History: The patient's family history includes Breast cancer in her mother; COPD in an other family member; Cancer in an other family member; Diabetes in an other family member; Heart failure in her maternal aunt; Hypertension in her mother and another family member.  ROS:   Please see the history of present illness.     All other systems reviewed and are negative.  EKGs/Labs/Other Studies Reviewed:    The following studies were reviewed today:  Echo 10/31/2019  1. Left ventricular ejection fraction, by estimation, is 65 to 70%. The  left ventricle has normal function. The left ventricle has no regional  wall motion abnormalities. Left ventricular diastolic parameters are  consistent with Grade I diastolic  dysfunction (impaired relaxation).   2. Right ventricular systolic function is normal. The right ventricular  size is normal. There is normal pulmonary artery systolic pressure.   3. The mitral valve is normal in structure. No evidence of mitral valve  regurgitation. No evidence of mitral stenosis.   4. Tricuspid valve regurgitation is moderate.   5. The aortic valve is normal in structure. Aortic valve regurgitation is  not visualized. No aortic stenosis is present.   6. The inferior vena cava is normal in size with greater than 50%  respiratory variability, suggesting right atrial pressure of 3 mmHg.    Cath 11/02/2019 Ramus lesion is 50% stenosed.   Patent coronary arteries with no evidence of obstructive CAD, with the exception of catheter-induced vasospasm in a small  intermediate branch   Recommend: medical therapy   EKG:  EKG is ordered today.  The ekg ordered today demonstrates normal sinus rhythm, no significant ST-T wave changes  Recent Labs: 10/19/2020: ALT 13; BUN 7; Creatinine, Ser 0.72; Hemoglobin 11.3; Platelets 310; Potassium 3.8; Sodium 140  Recent Lipid Panel    Component Value Date/Time   CHOL 168 10/19/2020 1159   TRIG 63 10/19/2020 1159   HDL 44 10/19/2020 1159   CHOLHDL 3.8 10/19/2020 1159   CHOLHDL 4.1 11/01/2019 0726   VLDL 23 11/01/2019 0726   LDLCALC 112 (H) 10/19/2020 1159     Risk Assessment/Calculations:           Physical Exam:    VS:  BP (!) 144/102   Pulse 84   Ht _0  (1.575 m)   Wt 213 lb 9.6 oz (96.9 kg)   SpO2 96%   BMI 39.07 kg/m     Wt Readings from Last 3  Encounters:  02/02/21 213 lb 9.6 oz (96.9 kg)  01/16/21 209 lb (94.8 kg)  10/19/20 226 lb 6.4 oz (102.7 kg)     GEN:  Well nourished, well developed in no acute distress HEENT: Normal NECK: No JVD; No carotid bruits LYMPHATICS: No lymphadenopathy CARDIAC: RRR, no murmurs, rubs, gallops RESPIRATORY:  Clear to auscultation without rales, wheezing or rhonchi  ABDOMEN: Soft, non-tender, non-distended MUSCULOSKELETAL:  No edema; No deformity  SKIN: Warm and dry NEUROLOGIC:  Alert and oriented x 3 PSYCHIATRIC:  Normal affect   ASSESSMENT:    1. Coronary artery disease involving native coronary artery of native heart without angina pectoris   2. Primary hypertension   3. Hyperlipidemia LDL goal <70   4. Controlled type 2 diabetes mellitus without complication, without long-term current use of insulin ([place])   5. Right forearm pain    PLAN:    In order of problems listed above:  CAD: Cardiac catheterization in 2021 showed 50% ramus lesion.  She denies any further chest pain.  On aspirin and Lipitor  Hypertension: Blood pressure is elevated today.  We will continue observation.  If systolic blood pressures persistently greater than 140  mmHg, I recommend increase carvedilol to 25 mg twice a day.  Hyperlipidemia: On Lipitor  DM2: Managed by primary care provider  Right forearm pain: She has been having intermittent sharp shooting pain in the right forearm ever since the cardiac catheterization last year.  However ultrasound did not show any significant arterial injury at the time.  She is planning to see neurology service.         Medication Adjustments/Labs and Tests Ordered: Current medicines are reviewed at length with the patient today.  Concerns regarding medicines are outlined above.  Orders Placed This Encounter  Procedures   EKG 12-Lead   No orders of the defined types were placed in this encounter.   Patient Instructions  Medication Instructions:  Your physician recommends that you continue on your current medications as directed. Please refer to the Current Medication list given to you today.  *If you need a refill on your cardiac medications before your next appointment, please call your pharmacy*  Lab Work: NONE ordered at this time of appointment   If you have labs (blood work) drawn today and your tests are completely normal, you will receive your results only by: [place] (if you have MyChart) OR A paper copy in the mail If you have any lab test that is abnormal or we need to change your treatment, we will call you to review the results.  Testing/Procedures: NONE ordered at this time of appointment   Follow-Up: At [facility], you and your health needs are our priority.  As part of our continuing mission to provide you with exceptional heart care, we have created designated Provider Care Teams.  These Care Teams include your primary Cardiologist (physician) and Advanced Practice Providers (APPs -  Physician Assistants and Nurse Practitioners) who all work together to provide you with the care you need, when you need it.  We recommend signing up for the patient portal called  "MyChart".  Sign up information is provided on this After Visit Summary.  MyChart is used to connect with patients for Virtual Visits (Telemedicine).  Patients are able to view lab/test results, encounter notes, upcoming appointments, etc.  Non-urgent messages can be sent to your provider as well.   To learn more about what you can do with MyChart, go to [email or web address].  Your next appointment:   9 month(s)  The format for your next appointment:   In Person  Provider:   Raliegh Ip Mali Hilty, MD  Other Instructions    Signed, Almyra Deforest, [place]  02/04/2021 11:49 PM    [place]

## 2021-02-02 NOTE — Patient Instructions (Addendum)
Medication Instructions:  Your physician recommends that you continue on your current medications as directed. Please refer to the Current Medication list given to you today.  *If you need a refill on your cardiac medications before your next appointment, please call your pharmacy*  Lab Work: NONE ordered at this time of appointment   If you have labs (blood work) drawn today and your tests are completely normal, you will receive your results only by: MyChart Message (if you have MyChart) OR A paper copy in the mail If you have any lab test that is abnormal or we need to change your treatment, we will call you to review the results.  Testing/Procedures: NONE ordered at this time of appointment   Follow-Up: At [facility] you and your health needs are our priority.  As part of our continuing mission to provide you with exceptional heart care, we have created designated Provider Care Teams.  These Care Teams include your primary Cardiologist (physician) and Advanced Practice Providers (APPs -  Physician Assistants and Nurse Practitioners) who all work together to provide you with the care you need, when you need it.  We recommend signing up for the patient portal called "MyChart".  Sign up information is provided on this After Visit Summary.  MyChart is used to connect with patients for Virtual Visits (Telemedicine).  Patients are able to view lab/test results, encounter notes, upcoming appointments, etc.  Non-urgent messages can be sent to your provider as well.   To learn more about what you can do with MyChart, go to [email or web address].    Your next appointment:   9 month(s)  The format for your next appointment:   In Person  Provider:   K. [place] Hilty, MD  Other Instructions

## 2021-02-04 ENCOUNTER — Encounter: Payer: Self-pay | Admitting: Physician Assistant

## 2021-03-27 ENCOUNTER — Other Ambulatory Visit: Payer: Self-pay

## 2021-03-27 ENCOUNTER — Encounter: Payer: Self-pay | Admitting: Neurology

## 2021-03-27 ENCOUNTER — Ambulatory Visit: Payer: 59 | Admitting: Neurology

## 2021-03-27 VITALS — BP 187/109 | HR 86 | Ht 62.0 in | Wt 215.0 lb

## 2021-03-27 DIAGNOSIS — R202 Paresthesia of skin: Secondary | ICD-10-CM

## 2021-03-27 DIAGNOSIS — M25531 Pain in right wrist: Secondary | ICD-10-CM | POA: Diagnosis not present

## 2021-03-27 NOTE — Patient Instructions (Signed)
Referral to sports medicine  Nerve testing of the right hand.  Do not apply oil or lotion on your hand on the day of testing.  ELECTROMYOGRAM AND NERVE CONDUCTION STUDIES (EMG/NCS) INSTRUCTIONS  How to Prepare The neurologist conducting the EMG will need to know if you have certain medical conditions. Tell the neurologist and other EMG lab personnel if you: Have a pacemaker or any other electrical medical device Take blood-thinning medications Have hemophilia, a blood-clotting disorder that causes prolonged bleeding Bathing Take a shower or bath shortly before your exam in order to remove oils from your skin. Don't apply lotions or creams before the exam.  What to Expect You'll likely be asked to change into a hospital gown for the procedure and lie down on an examination table. The following explanations can help you understand what will happen during the exam.  Electrodes. The neurologist or a technician places surface electrodes at various locations on your skin depending on where you're experiencing symptoms. Or the neurologist may insert needle electrodes at different sites depending on your symptoms.  Sensations. The electrodes will at times transmit a tiny electrical current that you may feel as a twinge or spasm. The needle electrode may cause discomfort or pain that usually ends shortly after the needle is removed. If you are concerned about discomfort or pain, you may want to talk to the neurologist about taking a short break during the exam.  Instructions. During the needle EMG, the neurologist will assess whether there is any spontaneous electrical activity when the muscle is at rest - activity that isn't present in healthy muscle tissue - and the degree of activity when you slightly contract the muscle.  He or she will give you instructions on resting and contracting a muscle at appropriate times. Depending on what muscles and nerves the neurologist is examining, he or she may ask you  to change positions during the exam.  After your EMG You may experience some temporary, minor bruising where the needle electrode was inserted into your muscle. This bruising should fade within several days. If it persists, contact your primary care doctor.

## 2021-03-27 NOTE — Progress Notes (Signed)
[facility] Clinic Note - Initial Visit   Date: 03/27/21  Amy [name] MRN: [id number] DOB: 01/04/1976   Dear Dr. Joya Gaskins:  Thank you for your kind referral of Amy [name] for consultation of right wrist pain. Although her history is well known to you, please allow [place] to reiterate it for the purpose of our medical record. The patient was accompanied to the clinic by sister who also provides collateral information.     History of Present Illness: Amy [name] is a 45 y.o. right-handed female with diabetes mellitus, hypertension, and CAD presenting for evaluation of right wrist pain.   She underwent cardic catherization via radial access in June 2021 and since this time, she has intermittent spells of sharp/throbbing pain over the medial wrist.  Occasionally, she will having tingling into her fingers on the palmer surface, no tingling/numbness over the back of the hand.  Sleeping and wrist movement ends to aggravate her pain. Aleve helps reduce the intensity of pain when severe.  No weakness in the hand or neck pain.  She is very frustrated that her symptoms have been ongoing for the past year and she has not received any answers or relief.   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 She lives at home with her father.  Out-side paper records, electronic medical record, and images have been reviewed where available and summarized as:  Lab Results  Component Value Date   HGBA1C 7.4 (A) 10/19/2020    Past Medical History:  Diagnosis Date   Anemia    Coronary artery disease    Diabetes mellitus without complication ([place])    Excessive or frequent menstruation 09/10/2012   Hypertension    NSTEMI (non-ST elevated myocardial infarction) ([place]) 10/30/2019    Past Surgical History:  Procedure Laterality Date   CHOLECYSTECTOMY  2006   CORONARY ANGIOPLASTY     DILITATION & CURRETTAGE/HYSTROSCOPY WITH HYDROTHERMAL ABLATION N/A 07/10/2013    Procedure: DILATATION & CURETTAGE/HYSTEROSCOPY WITH HYDROTHERMAL ABLATION;  Surgeon: Shelly Bombard, MD;  Location: [facility];  Service: Gynecology;  Laterality: N/A;   LEFT HEART CATH AND CORONARY ANGIOGRAPHY N/A 11/02/2019   Procedure: LEFT HEART CATH AND CORONARY ANGIOGRAPHY;  Surgeon: Sherren Mocha, MD;  Location: [facility];  Service: Cardiovascular;  Laterality: N/A;   TUBAL LIGATION  1997   WISDOM TOOTH EXTRACTION  1998     Medications:  Outpatient Encounter Medications as of 03/27/2021  Medication Sig   aspirin 81 MG EC tablet Take 1 tablet (81 mg total) by mouth daily.   atorvastatin (LIPITOR) 40 MG tablet TAKE 1 TABLET (40 MG TOTAL) BY MOUTH DAILY.   Blood Glucose Monitoring Suppl (TRUE METRIX METER) w/Device KIT Use to check blood sugars twice per day   carvedilol (COREG) 12.5 MG tablet Take 1 tablet (12.5 mg total) by mouth 2 (two) times daily with a meal.   empagliflozin (JARDIANCE) 10 MG TABS tablet Take 1 tablet (10 mg total) by mouth daily.   glucose blood (TRUE METRIX BLOOD GLUCOSE TEST) test strip Use as instructed to check BS twice per day   Multiple Vitamin (MULTIVITAMIN WITH MINERALS) TABS tablet Take 1 tablet by mouth daily.   nitroGLYCERIN (NITROSTAT) 0.4 MG SL tablet Place 1 tablet (0.4 mg total) under the tongue every 5 (five) minutes x 3 doses as needed for chest pain.   TRUEplus Lancets 28G MISC Use when checking BS twice per day   valsartan (DIOVAN) 160 MG tablet Take 1 tablet (160 mg total) by  mouth daily.   Dulaglutide 0.75 MG/0.5ML SOPN INJECT 0.75 MG INTO THE SKIN ONCE A WEEK. (Patient not taking: Reported on 03/27/2021)   No facility-administered encounter medications on file as of 03/27/2021.    Allergies:  Allergies  Allergen Reactions   Amlodipine Hives   Hydrocodone-Acetaminophen Hives and Itching    And itching Itching and hives   Losartan Potassium-Hctz Other (See Comments)    Dizziness   Tetanus Antitoxin     Arm swelling size of a  softball.    Tetanus Toxoids     Arm swelling size of a softball.     Family History: Family History  Problem Relation Age of Onset   Breast cancer Mother    Hypertension Mother    Hypertension Father    Heart failure Maternal Aunt    COPD Other    Hypertension Other    Diabetes Other    Cancer Other     Social History: Social History   Tobacco Use   Smoking status: Never   Smokeless tobacco: Never  Vaping Use   Vaping Use: Never used  Substance Use Topics   Alcohol use: No   Drug use: No   Social History   Social History Narrative   Lives locally.  Does not routinely exercise.  In school @ [employer] for nsg.         Lives in a one story home    Right Handed     Vital Signs:  BP (!) 187/109   Pulse 86   Ht 5' 2" (1.575 m)   Wt 215 lb (97.5 kg)   SpO2 99%   BMI 39.32 kg/m   Neurological Exam: MENTAL STATUS including orientation to time, place, person, recent and remote memory, attention span and concentration, language, and fund of knowledge is normal.  Speech is not dysarthric.  CRANIAL NERVES: II:  No visual field defects.     III-IV-VI: Pupils equal round and reactive to light.  Normal conjugate, extra-ocular eye movements in all directions of gaze.  No nystagmus.  No ptosis.   V:  Normal facial sensation.    VII:  Normal facial symmetry and movements.   VIII:  Normal hearing and vestibular function.   IX-X:  Normal palatal movement.   XI:  Normal shoulder shrug and head rotation.   XII:  Normal tongue strength and range of motion, no deviation or fasciculation.  MOTOR:  No atrophy, fasciculations or abnormal movements.  No pronator drift.   Upper Extremity:  Right  Left  Deltoid  5/5   5/5   Biceps  5/5   5/5   Triceps  5/5   5/5   Infraspinatus 5/5  5/5  Medial pectoralis 5/5  5/5  Wrist extensors  5/5   5/5   Wrist flexors  5/5   5/5   Finger extensors  5/5   5/5   Finger flexors  5/5   5/5   Dorsal interossei  5/5   5/5   Abductor pollicis   5/5   5/5   Tone (Ashworth scale)  0  0   Lower Extremity:  Right  Left  Hip flexors  5/5   5/5   Hip extensors  5/5   5/5   Adductor 5/5  5/5  Abductor 5/5  5/5  Knee flexors  5/5   5/5   Knee extensors  5/5   5/5   Dorsiflexors  5/5   5/5   Plantarflexors  5/5   5/5  Toe extensors  5/5   5/5   Toe flexors  5/5   5/5   Tone (Ashworth scale)  0  0   MSRs:  Right        Left                  brachioradialis 2+  2+  biceps 2+  2+  triceps 2+  2+  patellar 2+  2+  ankle jerk 2+  2+  Hoffman no  no  plantar response down  down   SENSORY:  Normal and symmetric perception of light touch, pinprick, vibration, and proprioception. Tinel's sign negative at the wrist.  COORDINATION/GAIT: Normal finger-to- nose-finger.  Intact rapid alternating movements bilaterally.   Gait narrow based and stable. Tandem and stressed gait intact.    IMPRESSION: Right wrist pain characterized as throbbing/sharp pain localized at the wrist is more suggestive of musculoskeletal pathology such as tendonitis.  No evidence of radial nerve injury.  - I will refer her to Sports Medicine to consider US of the wrist Possible overlapping right carpal tunnel syndrome.   - NCS/EMG of the right arm will be ordered to evaluate for this.  3.   Elevated BP (187/109) with known hypertension, asymptomatic  - Follow-up with PCP scheduled for next month  Further recommendations pending results.    Thank you for allowing me to participate in patient's care.  If I can answer any additional questions, I would be pleased to do so.    Sincerely,    Urian Martenson K. Posey Pronto, DO

## 2021-03-28 ENCOUNTER — Other Ambulatory Visit: Payer: Self-pay

## 2021-04-03 ENCOUNTER — Ambulatory Visit: Payer: Self-pay | Admitting: *Deleted

## 2021-04-03 DIAGNOSIS — E119 Type 2 diabetes mellitus without complications: Secondary | ICD-10-CM

## 2021-04-03 DIAGNOSIS — I10 Essential (primary) hypertension: Secondary | ICD-10-CM

## 2021-04-03 NOTE — Telephone Encounter (Signed)
I attempted to return her call.   She had called in concerned about having fatigue from valsartan and wanted to discuss it with a nurse. I left a voicemail for her to return the call to [facility] with the phone number.

## 2021-04-03 NOTE — Telephone Encounter (Signed)
Pt called stating that her medication, Valsartan, is causing her to be more fatigued than normal. She states that she is concerned and is requesting to have a call back. Please advise.   Patient called back and reports she is concerned regarding side effects after taking valsartan. C/o muscle cramps, bilateral calves cramping and she can barely walk. C/o feel exhausted and co workers are noticing she is fatigued at work. C/o mild chest pain and pressure at times. Not now. Feels fatigue now . Denies difficulty breathing no fever, drinking plenty of fluids. Reports blood sugars are great. Appt already scheduled for 04/18/21. Can patient get lab work done prior to appt? Per patient request. Patient would like to know if she can take less of valsartan to decrease cramping and fatigue.  Please advise. No earlier appt available . Care advise given. Patient verbalized understanding of care advise and to go to [facility] or ED if symptoms worsen.       Reason for Disposition  Taking a medicine that could cause weakness (e.g., blood pressure medications, diuretics)  Answer Assessment - Initial Assessment Questions 1. DESCRIPTION: "Describe how you are feeling."     Exhausted, muscle cramping noted. Esp. In bilateral calves  2. SEVERITY: "How bad is it?"  "Can you stand and walk?"   - MILD - Feels weak or tired, but does not interfere with work, school or normal activities   - MODERATE - Able to stand and walk; weakness interferes with work, school, or normal activities   - SEVERE - Unable to stand or walk     Mild to moderate at times 3. ONSET:  "When did the weakness begin?"     When started valsartan  4. CAUSE: "What do you think is causing the weakness?"     medication 5. MEDICINES: "Have you recently started a new medicine or had a change in the amount of a medicine?"     Yes valsartan  6. OTHER SYMPTOMS: "Do you have any other symptoms?" (e.g., chest pain, fever, cough, SOB, vomiting, diarrhea,  bleeding, other areas of pain)     Mild chest pain at times, cramps in legs, fatigue  7. PREGNANCY: "Is there any chance you are pregnant?" "When was your last menstrual period?"     Endometrial oblation 8 years ago  Protocols used: Weakness (Generalized) and Fatigue-A-AH

## 2021-04-04 MED ORDER — VALSARTAN 160 MG PO TABS: 80.0000 mg | ORAL_TABLET | Freq: Every day | ORAL | 6 refills | Status: DC

## 2021-04-04 NOTE — Telephone Encounter (Signed)
Pt was called and is aware of Doctors note and appointment was scheduled.

## 2021-04-04 NOTE — Telephone Encounter (Signed)
Recommend pt cutting pill in half with pill cutter.  Recommend coming in for labs today and BP check with RN pls arrange

## 2021-04-12 ENCOUNTER — Ambulatory Visit: Payer: 59 | Attending: Critical Care Medicine

## 2021-04-12 ENCOUNTER — Other Ambulatory Visit: Payer: Self-pay | Admitting: Critical Care Medicine

## 2021-04-12 ENCOUNTER — Other Ambulatory Visit: Payer: Self-pay

## 2021-04-12 ENCOUNTER — Ambulatory Visit: Payer: 59 | Admitting: Family Medicine

## 2021-04-12 DIAGNOSIS — E119 Type 2 diabetes mellitus without complications: Secondary | ICD-10-CM

## 2021-04-13 ENCOUNTER — Telehealth: Payer: Self-pay

## 2021-04-13 LAB — COMPREHENSIVE METABOLIC PANEL
ALT: 8 IU/L (ref 0–32)
AST: 15 IU/L (ref 0–40)
Albumin/Globulin Ratio: 1 — ABNORMAL LOW (ref 1.2–2.2)
Albumin: 4 g/dL (ref 3.8–4.8)
Alkaline Phosphatase: 91 IU/L (ref 44–121)
BUN/Creatinine Ratio: 18 (ref 9–23)
BUN: 13 mg/dL (ref 6–24)
Bilirubin Total: 0.2 mg/dL (ref 0.0–1.2)
CO2: 21 mmol/L (ref 20–29)
Calcium: 9.4 mg/dL (ref 8.7–10.2)
Chloride: 102 mmol/L (ref 96–106)
Creatinine, Ser: 0.73 mg/dL (ref 0.57–1.00)
Globulin, Total: 3.9 g/dL (ref 1.5–4.5)
Glucose: 122 mg/dL — ABNORMAL HIGH (ref 70–99)
Potassium: 4 mmol/L (ref 3.5–5.2)
Sodium: 140 mmol/L (ref 134–144)
Total Protein: 7.9 g/dL (ref 6.0–8.5)
eGFR: 102 mL/min/{1.73_m2} (ref >59)

## 2021-04-13 LAB — CBC WITH DIFFERENTIAL/PLATELET
Basophils Absolute: 0 10*3/uL (ref 0.0–0.2)
Basos: 1 %
EOS (ABSOLUTE): 0.1 10*3/uL (ref 0.0–0.4)
Eos: 2 %
Hematocrit: 34.6 % (ref 34.0–46.6)
Hemoglobin: 11.3 g/dL (ref 11.1–15.9)
Immature Grans (Abs): 0 10*3/uL (ref 0.0–0.1)
Immature Granulocytes: 0 %
Lymphocytes Absolute: 2.4 10*3/uL (ref 0.7–3.1)
Lymphs: 31 %
MCH: 27.8 pg (ref 26.6–33.0)
MCHC: 32.7 g/dL (ref 31.5–35.7)
MCV: 85 fL (ref 79–97)
Monocytes Absolute: 0.6 10*3/uL (ref 0.1–0.9)
Monocytes: 8 %
Neutrophils Absolute: 4.5 10*3/uL (ref 1.4–7.0)
Neutrophils: 58 %
Platelets: 324 10*3/uL (ref 150–450)
RBC: 4.07 x10E6/uL (ref 3.77–5.28)
RDW: 13 % (ref 11.7–15.4)
WBC: 7.6 10*3/uL (ref 3.4–10.8)

## 2021-04-13 LAB — HEMOGLOBIN A1C
Est. average glucose Bld gHb Est-mCnc: 143 mg/dL
Hgb A1c MFr Bld: 6.6 % — ABNORMAL HIGH (ref 4.8–5.6)

## 2021-04-13 NOTE — Telephone Encounter (Signed)
-----   Message from Storm Frisk, MD sent at 04/13/2021  6:03 AM EST ----- Let pt know labs all normal.  Diabetes much improved , A1C down to 6.6  good work on diabetes care

## 2021-04-13 NOTE — Telephone Encounter (Signed)
Pt was called and is aware of results, DOB was confirmed.  ?

## 2021-04-18 ENCOUNTER — Ambulatory Visit: Payer: 59 | Admitting: Family Medicine

## 2021-04-18 ENCOUNTER — Ambulatory Visit: Payer: 59 | Admitting: Critical Care Medicine

## 2021-04-18 ENCOUNTER — Encounter: Payer: 59 | Admitting: Neurology

## 2021-04-18 NOTE — Progress Notes (Deleted)
   I, Philbert Riser, LAT, ATC acting as a scribe for Clementeen Graham, MD.  Subjective:    CC: R wrist pain  HPI: Pt is a 45 y/o female c/o R wrist pain ongoing since June 2021 after she underwent cardic catherization via radial access. Pt is R-hand dominate. Pt was seen by Dr. Allena Katz at [facility] on 03/27/21 for this issue. Pt locates pain to  Grip strength: Numbness/tingling: yes Aggravates: sleeping, wrist AROM Treatments tried: naproxen  Pertinent review of Systems: ***  Relevant historical information: ***   Objective:   There were no vitals filed for this visit. General: Well Developed, well nourished, and in no acute distress.   MSK: ***  Lab and Radiology Results No results found for this or any previous visit (from the past 72 hour(s)). No results found.    Impression and Recommendations:    Assessment and Plan: 45 y.o. female with ***.  PDMP not reviewed this encounter. No orders of the defined types were placed in this encounter.  No orders of the defined types were placed in this encounter.   Discussed warning signs or symptoms. Please see discharge instructions. Patient expresses understanding.   ***

## 2021-04-27 ENCOUNTER — Encounter: Payer: Self-pay | Admitting: Gastroenterology

## 2021-04-30 NOTE — Progress Notes (Signed)
Established Patient Office Visit  Subjective:  Patient ID: Amy [name], female    DOB: 15-Aug-1975  Age: 45 y.o. MRN: [id number]  CC:  Chief Complaint  Patient presents with   Hypertension   Med Change Request    HPI Amy [name] presents for care follow-up and treatment of hypertension.  On arrival blood pressure 175/94 and patient has not been taking the valsartan and because of side effects.  Amy [name] states it causes extreme dizziness and weakness.  Note Amy [name] is under continued severe stress with her current job.  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  Note previous A1c in November was 6.6 Amy [name] has been compliant with the Jardiance.  Patient also had been on Trulicity but had side effects from Trulicity and is off the Trulicity at this time.  Note Amy [name] did not tolerate losartan in the past and also amlodipine.  Amy [name] had hives with amlodipine and dizziness with losartan.  Amy [name] would like to try a lower dose of valsartan with HCTZ.   The patient has a pending referral to gastroenterology for colon cancer screening and Amy [name] did receive flu vaccine at this visit.    Past Medical History:  Diagnosis Date   Anemia    Coronary artery disease    Diabetes mellitus without complication ([place])    Excessive or frequent menstruation 09/10/2012   Hypertension    NSTEMI (non-ST elevated myocardial infarction) ([place]) 10/30/2019    Past Surgical History:  Procedure Laterality Date   CHOLECYSTECTOMY  2006   CORONARY ANGIOPLASTY     DILITATION & CURRETTAGE/HYSTROSCOPY WITH HYDROTHERMAL ABLATION N/A 07/10/2013   Procedure: DILATATION & CURETTAGE/HYSTEROSCOPY WITH HYDROTHERMAL ABLATION;  Surgeon: Shelly Bombard, MD;  Location: [facility];  Service: Gynecology;  Laterality: N/A;   LEFT HEART CATH AND CORONARY ANGIOGRAPHY N/A 11/02/2019   Procedure: LEFT HEART CATH AND CORONARY  ANGIOGRAPHY;  Surgeon: Sherren Mocha, MD;  Location: [facility];  Service: Cardiovascular;  Laterality: N/A;   TUBAL LIGATION  1997   WISDOM TOOTH EXTRACTION  1998    Family History  Problem Relation Age of Onset   Breast cancer Mother    Hypertension Mother    Hypertension Father    Heart failure Maternal Aunt    COPD Other    Hypertension Other    Diabetes Other    Cancer Other     Social History   Socioeconomic History   Marital status: Married    Spouse name: Engineer, maintenance   Number of children: 2   Years of education: Not on file   Highest education level: Some college, no degree  Occupational History   Occupation: Animal nutritionist     Comment: [employer], forensic  Tobacco Use   Smoking status: Never   Smokeless tobacco: Never  Vaping Use   Vaping Use: Never used  Substance and Sexual Activity   Alcohol use: No   Drug use: No   Sexual activity: Not on file  Other Topics Concern   Not on file  Social History Narrative   Lives locally.  Does not routinely exercise.  In school @ [employer] for nsg.         Lives in a one story home    Right Handed    Social Determinants of Health   Financial Resource Strain: Not on file  Food Insecurity: Not  on file  Transportation Needs: Not on file  Physical Activity: Not on file  Stress: Not on file  Social Connections: Not on file  Intimate Partner Violence: Not on file    Outpatient Medications Prior to Visit  Medication Sig Dispense Refill   aspirin 81 MG EC tablet Take 1 tablet (81 mg total) by mouth daily. 90 tablet 3   Blood Glucose Monitoring Suppl (TRUE METRIX METER) w/Device KIT Use to check blood sugars twice per day 1 kit 0   glucose blood (TRUE METRIX BLOOD GLUCOSE TEST) test strip Use as instructed to check BS twice per day 100 each 12   Multiple Vitamin (MULTIVITAMIN WITH MINERALS) TABS tablet Take 1 tablet by mouth daily.     nitroGLYCERIN (NITROSTAT) 0.4 MG SL tablet Place 1 tablet (0.4  mg total) under the tongue every 5 (five) minutes x 3 doses as needed for chest pain. 25 tablet 12   TRUEplus Lancets 28G MISC Use when checking BS twice per day 100 each 11   atorvastatin (LIPITOR) 40 MG tablet TAKE 1 TABLET (40 MG TOTAL) BY MOUTH DAILY. 90 tablet 3   carvedilol (COREG) 12.5 MG tablet Take 1 tablet (12.5 mg total) by mouth 2 (two) times daily with a meal. 60 tablet 4   empagliflozin (JARDIANCE) 10 MG TABS tablet Take 1 tablet (10 mg total) by mouth daily. 30 tablet 3   valsartan (DIOVAN) 160 MG tablet Take 0.5 tablets (80 mg total) by mouth daily. 60 tablet 6   Dulaglutide 0.75 MG/0.5ML SOPN INJECT 0.75 MG INTO THE SKIN ONCE A WEEK. (Patient not taking: Reported on 05/01/2021) 2 mL 4   No facility-administered medications prior to visit.    Allergies  Allergen Reactions   Amlodipine Hives   Hydrocodone-Acetaminophen Hives and Itching    And itching Itching and hives   Losartan Potassium-Hctz Other (See Comments)    Dizziness   Tetanus Antitoxin     Arm swelling size of a softball.    Tetanus Toxoids     Arm swelling size of a softball.     ROS Review of Systems  Constitutional:  Negative for chills, diaphoresis and fever.  HENT:  Negative for congestion, hearing loss, nosebleeds, sore throat and tinnitus.   Eyes:  Negative for photophobia and redness.  Respiratory:  Negative for cough, shortness of breath, wheezing and stridor.   Cardiovascular:  Negative for chest pain, palpitations and leg swelling.  Gastrointestinal:  Negative for abdominal pain, blood in stool, constipation, diarrhea, nausea and vomiting.  Endocrine: Negative for polydipsia.  Genitourinary:  Negative for dysuria, flank pain, frequency, hematuria and urgency.  Musculoskeletal:  Negative for back pain, myalgias and neck pain.  Skin:  Negative for rash.  Allergic/Immunologic: Negative for environmental allergies.  Neurological:  Negative for dizziness, tremors, seizures, weakness and headaches.   Hematological:  Does not bruise/bleed easily.  Psychiatric/Behavioral:  Positive for sleep disturbance. Negative for self-injury and suicidal ideas. The patient is nervous/anxious.      Objective:    Physical Exam Vitals reviewed.  Constitutional:      Appearance: Normal appearance. Amy [name] is well-developed. Amy [name] is obese. Amy [name] is not diaphoretic.  HENT:     Head: Normocephalic and atraumatic.     Nose: No nasal deformity, septal deviation, mucosal edema or rhinorrhea.     Right Sinus: No maxillary sinus tenderness or frontal sinus tenderness.     Left Sinus: No maxillary sinus tenderness or frontal sinus tenderness.     Mouth/Throat:  Pharynx: No oropharyngeal exudate.  Eyes:     General: No scleral icterus.    Conjunctiva/sclera: Conjunctivae normal.     Pupils: Pupils are equal, round, and reactive to light.  Neck:     Thyroid: No thyromegaly.     Vascular: No carotid bruit or JVD.     Trachea: Trachea normal. No tracheal tenderness or tracheal deviation.  Cardiovascular:     Rate and Rhythm: Normal rate and regular rhythm.     Chest Wall: PMI is not displaced.     Pulses: Normal pulses. No decreased pulses.     Heart sounds: Normal heart sounds, S1 normal and S2 normal. Heart sounds not distant. No murmur heard. No systolic murmur is present.  No diastolic murmur is present.    No friction rub. No gallop. No S3 or S4 sounds.  Pulmonary:     Effort: No tachypnea, accessory muscle usage or respiratory distress.     Breath sounds: No stridor. No decreased breath sounds, wheezing, rhonchi or rales.  Chest:     Chest wall: No tenderness.  Abdominal:     General: Bowel sounds are normal. There is no distension.     Palpations: Abdomen is soft. Abdomen is not rigid.     Tenderness: There is no abdominal tenderness. There is no guarding or rebound.  Musculoskeletal:        General: Normal range of motion.     Cervical back: Normal range of motion and neck supple. No edema,  erythema or rigidity. No muscular tenderness. Normal range of motion.     Comments: Foot exam was normal  Lymphadenopathy:     Head:     Right side of head: No submental or submandibular adenopathy.     Left side of head: No submental or submandibular adenopathy.     Cervical: No cervical adenopathy.  Skin:    General: Skin is warm and dry.     Coloration: Skin is not pale.     Findings: No rash.     Nails: There is no clubbing.  Neurological:     Mental Status: Amy [name] is alert and oriented to person, place, and time.     Sensory: No sensory deficit.  Psychiatric:        Mood and Affect: Mood normal.        Speech: Speech normal.        Behavior: Behavior normal.        Thought Content: Thought content normal.        Judgment: Judgment normal.    BP (!) 175/94   Pulse 81   Resp 16   Wt 223 lb 12.8 oz (101.5 kg)   SpO2 99%   BMI 40.93 kg/m  Wt Readings from Last 3 Encounters:  05/01/21 223 lb 12.8 oz (101.5 kg)  03/27/21 215 lb (97.5 kg)  02/02/21 213 lb 9.6 oz (96.9 kg)     Health Maintenance Due  Topic Date Due   COLONOSCOPY (Pts 45-28yr Insurance coverage will need to be confirmed)  Never done   COVID-19 Vaccine (2 - [place] risk series) 03/02/2020   FOOT EXAM  01/03/2021    There are no preventive care reminders to display for this patient.  No results found for: TSH Lab Results  Component Value Date   WBC 7.6 04/12/2021   HGB 11.3 04/12/2021   HCT 34.6 04/12/2021   MCV 85 04/12/2021   PLT 324 04/12/2021   Lab Results  Component Value Date  NA 140 04/12/2021   K 4.0 04/12/2021   CO2 21 04/12/2021   GLUCOSE 122 (H) 04/12/2021   BUN 13 04/12/2021   CREATININE 0.73 04/12/2021   BILITOT 0.2 04/12/2021   ALKPHOS 91 04/12/2021   AST 15 04/12/2021   ALT 8 04/12/2021   PROT 7.9 04/12/2021   ALBUMIN 4.0 04/12/2021   CALCIUM 9.4 04/12/2021   ANIONGAP 10 11/02/2019   EGFR 102 04/12/2021   Lab Results  Component Value Date   CHOL 168 10/19/2020   Lab  Results  Component Value Date   HDL 44 10/19/2020   Lab Results  Component Value Date   LDLCALC 112 (H) 10/19/2020   Lab Results  Component Value Date   TRIG 63 10/19/2020   Lab Results  Component Value Date   CHOLHDL 3.8 10/19/2020   Lab Results  Component Value Date   HGBA1C 6.6 (H) 04/12/2021      Assessment & Plan:   Problem List Items Addressed This Visit       Cardiovascular and Mediastinum   Essential hypertension    Hypertension not well controlled plan to switch valsartan to valsartan HCT and continue carvedilol however will increase dosing to 25 mg twice daily  Patient advised to seek employment that is less stressful and to follow a healthier dietary pattern we educated her to proper low-salt diet which he is not been adherent    Short-term follow-up scheduled with clinical pharmacy and Dr. Joya Gaskins again in 2 months      Relevant Medications   atorvastatin (LIPITOR) 40 MG tablet   carvedilol (COREG) 25 MG tablet   valsartan-hydrochlorothiazide (DIOVAN-HCT) 80-12.5 MG tablet   Coronary artery disease due to type 2 diabetes mellitus (HCC)   Relevant Medications   atorvastatin (LIPITOR) 40 MG tablet   carvedilol (COREG) 25 MG tablet   empagliflozin (JARDIANCE) 10 MG TABS tablet   valsartan-hydrochlorothiazide (DIOVAN-HCT) 80-12.5 MG tablet     Endocrine   Type 2 diabetes mellitus without complication, without long-term current use of insulin ([place]) - Primary    Type 2 diabetes without long-term current use of insulin continue Jardiance for now Amy [name] is at goal      Relevant Medications   atorvastatin (LIPITOR) 40 MG tablet   empagliflozin (JARDIANCE) 10 MG TABS tablet   valsartan-hydrochlorothiazide (DIOVAN-HCT) 80-12.5 MG tablet   Other Relevant Orders   POCT glucose (manual entry) (Completed)     Other   Class 3 severe obesity due to excess calories with serious comorbidity and body mass index (BMI) of 40.0 to 44.9 in adult ([place])    Recommended  continued dietary adherence      Relevant Medications   empagliflozin (JARDIANCE) 10 MG TABS tablet   Other Visit Diagnoses     Need for immunization against influenza       Relevant Orders   Flu Vaccine QUAD 80moIM (Fluarix, Fluzone & Alfiuria Quad PF) (Completed)       Meds ordered this encounter  Medications   atorvastatin (LIPITOR) 40 MG tablet    Sig: TAKE 1 TABLET (40 MG TOTAL) BY MOUTH DAILY.    Dispense:  90 tablet    Refill:  3   carvedilol (COREG) 25 MG tablet    Sig: Take 1 tablet (25 mg total) by mouth 2 (two) times daily with a meal.    Dispense:  60 tablet    Refill:  4   empagliflozin (JARDIANCE) 10 MG TABS tablet    Sig: Take 1 tablet (10  mg total) by mouth daily.    Dispense:  30 tablet    Refill:  3   valsartan-hydrochlorothiazide (DIOVAN-HCT) 80-12.5 MG tablet    Sig: Take 1 tablet by mouth daily.    Dispense:  90 tablet    Refill:  3    Follow-up: Return in about 6 weeks (around 06/12/2021).    Asencion Noble, MD

## 2021-05-01 ENCOUNTER — Other Ambulatory Visit: Payer: Self-pay

## 2021-05-01 ENCOUNTER — Encounter: Payer: Self-pay | Admitting: Critical Care Medicine

## 2021-05-01 ENCOUNTER — Ambulatory Visit: Payer: 59 | Attending: Critical Care Medicine | Admitting: Critical Care Medicine

## 2021-05-01 VITALS — BP 175/94 | HR 81 | Resp 16 | Wt 223.8 lb

## 2021-05-01 DIAGNOSIS — Z23 Encounter for immunization: Secondary | ICD-10-CM | POA: Diagnosis not present

## 2021-05-01 DIAGNOSIS — I2510 Atherosclerotic heart disease of native coronary artery without angina pectoris: Secondary | ICD-10-CM

## 2021-05-01 DIAGNOSIS — I10 Essential (primary) hypertension: Secondary | ICD-10-CM

## 2021-05-01 DIAGNOSIS — Z6841 Body Mass Index (BMI) 40.0 and over, adult: Secondary | ICD-10-CM

## 2021-05-01 DIAGNOSIS — E119 Type 2 diabetes mellitus without complications: Secondary | ICD-10-CM

## 2021-05-01 DIAGNOSIS — E1159 Type 2 diabetes mellitus with other circulatory complications: Secondary | ICD-10-CM | POA: Diagnosis not present

## 2021-05-01 LAB — GLUCOSE, POCT (MANUAL RESULT ENTRY): POC Glucose: 164 mg/dl — AB (ref 70–99)

## 2021-05-01 MED ORDER — VALSARTAN-HYDROCHLOROTHIAZIDE 80-12.5 MG PO TABS
1.0000 | ORAL_TABLET | Freq: Every day | ORAL | 3 refills | Status: DC
  Filled 2021-05-01: qty 90, 90d supply, fill #0

## 2021-05-01 MED ORDER — ATORVASTATIN CALCIUM 40 MG PO TABS
ORAL_TABLET | Freq: Every day | ORAL | 3 refills | Status: DC
  Filled 2021-05-01: qty 90, 90d supply, fill #0

## 2021-05-01 MED ORDER — EMPAGLIFLOZIN 10 MG PO TABS
10.0000 mg | ORAL_TABLET | Freq: Every day | ORAL | 3 refills | Status: DC
  Filled 2021-05-01: qty 30, 30d supply, fill #0

## 2021-05-01 MED ORDER — CARVEDILOL 25 MG PO TABS
25.0000 mg | ORAL_TABLET | Freq: Two times a day (BID) | ORAL | 4 refills | Status: DC
  Filled 2021-05-01: qty 60, 30d supply, fill #0

## 2021-05-01 NOTE — Progress Notes (Signed)
Subjective:    CC: R wrist pain  I, Amy [name], LAT, ATC, am serving as scribe for Dr. Clementeen Graham.  HPI: Pt is a 45 y/o female presenting w/ R wrist pain since June 2021 after having a cardiac catherization.  She locates her pain to anterior aspect of the distal forearm w/ intermittent shooting pain proximally.  Pt notes that pain is sporadic and can't pinpoint what causes it. Pt is R-hand dominate. She was seen by Dr. Allena Katz at [facility] on 03/27/21 for these complaints and was discouraged w/ the care she received, and who referred her to Sports Medicine. Pt expressed frustration in dealing with this issue for over a year now and is desperate to figure out the cause and correct treatment option.  Patient has been referred to neurology and has a nerve conduction scheduled with Dr. Allena Katz on December 15.  R wrist swelling: no Grip strength: decreased R UE numbness/tingling: yes into her R fingers, palmar surface only Aggravating factors: nothing in particular Treatments tried: wrist splint, ice, heat,   Diagnostic testing: R UE NCV/EMG ordered but not performed yet;   Pertinent review of Systems: No fevers or chills  Relevant historical information: Diabetes.   Objective:    Vitals:   05/02/21 0850  BP: (!) 162/100  Pulse: 79  SpO2: 97%   General: Well Developed, well nourished, and in no acute distress.   MSK: Right wrist: Normal-appearing Normal motion some pain with resisted flexion and extension. Mildly tender palpation volar radial wrist. Pulses capillary fill and sensation are intact distally. Positive Tinel's at carpal tunnel. Positive Phalen's test. Strength is intact distally.  Lab and Radiology Results  Diagnostic Limited MSK Ultrasound of: Right wrist Carpal tunnel: Median nerve visualized.  At area of discomfort the median nerve does approach the radial artery.  Radial artery appears to be patent.   Pressure ultrasound probe on radial nerve  does reproduce paresthesias. Tendinous structures normal-appearing volar wrist. Impression: Probable carpal tunnel syndrome.  Impression of vascular arterial ultrasound December 03, 2019 Summary:     Right: No obstruction visualized in the right upper extremity No         evidence of a right wrist pseudoaneurysm or hematoma.         Patent radial and ulnar veins.  *See table(s) above for measurements and observations.     Electronically signed by Charlton Haws MD on 12/04/2019 at 7:53:44 AM.    Impression and Recommendations:    Assessment and Plan: 45 y.o. female with right wrist discomfort 1.5 year following cardiac catheterization.  Patient symptoms are most consistent with carpal tunnel syndrome or at least median nerve dysfunction and with some tendinopathy of the wrist.  We will treat for both carpal tunnel as well as wrist tendinitis.  Fortunately she has a nerve conduction study scheduled for December 15.  This should be quite revealing.  Plan for night splint and trial of gabapentin at bedtime.  Additionally refer to occupational therapy/hand therapy which should be quite helpful.  Recheck in about a month. If carpal tunnel syndrome is somewhat suspected based on nerve conduction study currently could consider a trial of injection.  Patient is obviously reluctant to consider injection following her experience with cardiac catheterization.  PDMP not reviewed this encounter. Orders Placed This Encounter  Procedures   [place] LIMITED JOINT SPACE STRUCTURES UP RIGHT(NO LINKED CHARGES)    Standing Status:   Future    Number of Occurrences:   1  Standing Expiration Date:   10/31/2021    Order Specific Question:   Reason for Exam (SYMPTOM  OR DIAGNOSIS REQUIRED)    Answer:   right wrist pain    Order Specific Question:   Preferred imaging location?    Answer:   [facility] referral to Occupational Therapy    Referral Priority:   Routine    Referral  Type:   Occupational Therapy    Referral Reason:   Specialty Services Required    Requested Specialty:   Occupational Therapy    Number of Visits Requested:   1   NCV with EMG(electromyography)    Standing Status:   Future    Standing Expiration Date:   05/02/2022    Scheduling Instructions:     Already scheduled wth Dr Allena Katz on Dec 15. EMG only if able. I will send her a note.    Order Specific Question:   Where should this test be performed?    Answer:   [facility]   Meds ordered this encounter  Medications   gabapentin (NEURONTIN) 300 MG capsule    Sig: Take 1 capsule (300 mg total) by mouth at bedtime.    Dispense:  30 capsule    Refill:  2    Discussed warning signs or symptoms. Please see discharge instructions. Patient expresses understanding.   The above documentation has been reviewed and is accurate and complete Clementeen Graham, M.D.

## 2021-05-01 NOTE — Patient Instructions (Addendum)
Change valsartan to valsartan HCT 80/12.5 daily  Increase Coreg / carvedilol to 25 mg twice daily  No change in other medications  Consider changing her job to a less stressful job as we discussed  Flu vaccine given  Return to see Dr. Delford Field in January

## 2021-05-02 ENCOUNTER — Other Ambulatory Visit: Payer: Self-pay

## 2021-05-02 ENCOUNTER — Ambulatory Visit: Payer: Self-pay

## 2021-05-02 ENCOUNTER — Ambulatory Visit: Payer: 59 | Admitting: Family Medicine

## 2021-05-02 VITALS — BP 162/100 | HR 79 | Ht 62.0 in | Wt 220.6 lb

## 2021-05-02 DIAGNOSIS — G5601 Carpal tunnel syndrome, right upper limb: Secondary | ICD-10-CM

## 2021-05-02 DIAGNOSIS — S6421XA Injury of radial nerve at wrist and hand level of right arm, initial encounter: Secondary | ICD-10-CM

## 2021-05-02 HISTORY — DX: Carpal tunnel syndrome, right upper limb: G56.01

## 2021-05-02 MED ORDER — GABAPENTIN 300 MG PO CAPS
300.0000 mg | ORAL_CAPSULE | Freq: Every day | ORAL | 2 refills | Status: DC
  Filled 2021-05-02: qty 30, 30d supply, fill #0

## 2021-05-02 NOTE — Assessment & Plan Note (Signed)
Type 2 diabetes without long-term current use of insulin continue Jardiance for now she is at goal

## 2021-05-02 NOTE — Assessment & Plan Note (Signed)
Hypertension not well controlled plan to switch valsartan to valsartan HCT and continue carvedilol however will increase dosing to 25 mg twice daily  Patient advised to seek employment that is less stressful and to follow a healthier dietary pattern we educated her to proper low-salt diet which he is not been adherent    Short-term follow-up scheduled with clinical pharmacy and Dr. Delford Field again in 2 months

## 2021-05-02 NOTE — Assessment & Plan Note (Signed)
Recommended continued dietary adherence

## 2021-05-02 NOTE — Patient Instructions (Addendum)
Thank you for coming in today.   I've referred you to Physical Therapy.  Let us know if you don't hear from them in one week.   Please use Voltaren gel (Generic Diclofenac Gel) up to 4x daily for pain as needed.  This is available over-the-counter as both the name brand Voltaren gel and the generic diclofenac gel.   Wear a wrist brace  I've prescribed Gabapentin to take at bedtime as needed  Recheck back in 1 month

## 2021-05-03 ENCOUNTER — Telehealth: Payer: Self-pay

## 2021-05-03 NOTE — Telephone Encounter (Signed)
-----   Message from Storm Frisk, MD sent at 05/02/2021  5:33 AM EST ----- Regarding: needs GI appt for colonoscopy Rhayne Chatwin Pls call this pt, GI has been trying to reach her about her colonoscopy she needs to call them back to schedule at [facility]

## 2021-05-03 NOTE — Telephone Encounter (Signed)
Called pt and left vm about appointment.

## 2021-05-03 NOTE — Telephone Encounter (Signed)
Pt stated she has everything scheduled already, [facility]

## 2021-05-11 ENCOUNTER — Other Ambulatory Visit: Payer: Self-pay

## 2021-05-11 ENCOUNTER — Ambulatory Visit: Payer: 59 | Admitting: Neurology

## 2021-05-11 ENCOUNTER — Ambulatory Visit: Payer: 59 | Admitting: Occupational Therapy

## 2021-05-11 DIAGNOSIS — M25531 Pain in right wrist: Secondary | ICD-10-CM | POA: Diagnosis not present

## 2021-05-11 NOTE — Procedures (Signed)
[facility]  [address] [place], [address]  [place], [place] [address] Tel: [phone] Fax:  [phone] Test Date:  05/11/2021  [name] DOB: 1975/10/15 Physician: Nita Sickle, DO  Sex: Female Height: 5\' 2"  Ref Phys: , DO  ID#: Nita Sickle   Technician:    Patient Complaints: This is a 45 year old female referred for evaluation wrist pain.  NCV & EMG Findings: Extensive electrodiagnostic testing of the right upper extremity shows: Right median, ulnar, radial, and mixed palmar sensory responses are within normal limits. Right median and ulnar motor responses are within normal limits. There is no evidence of active or chronic motor axonal loss changes affecting any of the tested muscles.  Motor unit configuration and recruitment pattern is within normal limits.  Impression: This is a normal study of the right upper extremity.  In particular, there is no evidence of carpal tunnel syndrome, radial neuropathy, or cervical radiculopathy.   ___________________________ 55, DO    Nerve Conduction Studies Anti Sensory Summary Table   Stim Site NR Peak (ms) Norm Peak (ms) P-T Amp (V) Norm P-T Amp  Right Median Anti Sensory (2nd Digit)  34C  Wrist    2.8 <3.4 42.6 >20  Right Radial Anti Sensory (Base 1st Digit)  34C  Wrist    1.8 <2.7 26.2 >18  Right Ulnar Anti Sensory (5th Digit)  34C  Wrist    2.5 <3.1 40.1 >12   Motor Summary Table   Stim Site NR Onset (ms) Norm Onset (ms) O-P Amp (mV) Norm O-P Amp Site1 Site2 Delta-0 (ms) Dist (cm) Vel (m/s) Norm Vel (m/s)  Right Median Motor (Abd Poll Brev)  34C  Wrist    2.7 <3.9 10.8 >6 Elbow Wrist 4.2 27.0 64 >50  Elbow    6.9  10.2         Right Ulnar Motor (Abd Dig Minimi)  34C  Wrist    1.7 <3.1 8.3 >7 B Elbow Wrist 3.5 21.0 60 >50  B Elbow    5.2  7.7  A Elbow B Elbow 1.9 10.0 53 >50  A Elbow    7.1  7.4          Comparison Summary Table   Stim Site NR Peak (ms) Norm  Peak (ms) P-T Amp (V) Site1 Site2 Delta-P (ms) Norm Delta (ms)  Right Median/Ulnar Palm Comparison (Wrist - 8cm)  34C  Median Palm    1.7 <2.2 42.4 Median Palm Ulnar Palm 0.2   Ulnar Palm    1.5 <2.2 18.6       EMG   Side Muscle Ins Act Fibs Psw Fasc Number Recrt Dur Dur. Amp Amp. Poly Poly. Comment  Right 1stDorInt Nml Nml Nml Nml Nml Nml Nml Nml Nml Nml Nml Nml N/A  Right PronatorTeres Nml Nml Nml Nml Nml Nml Nml Nml Nml Nml Nml Nml N/A  Right Biceps Nml Nml Nml Nml Nml Nml Nml Nml Nml Nml Nml Nml N/A  Right Triceps Nml Nml Nml Nml Nml Nml Nml Nml Nml Nml Nml Nml N/A  Right Deltoid Nml Nml Nml Nml Nml Nml Nml Nml Nml Nml Nml Nml N/A      Waveforms:

## 2021-05-16 ENCOUNTER — Ambulatory Visit: Payer: 59 | Attending: Family Medicine | Admitting: Occupational Therapy

## 2021-05-16 ENCOUNTER — Other Ambulatory Visit: Payer: Self-pay

## 2021-05-16 ENCOUNTER — Encounter: Payer: Self-pay | Admitting: Occupational Therapy

## 2021-05-16 DIAGNOSIS — M25531 Pain in right wrist: Secondary | ICD-10-CM | POA: Insufficient documentation

## 2021-05-16 DIAGNOSIS — R208 Other disturbances of skin sensation: Secondary | ICD-10-CM | POA: Insufficient documentation

## 2021-05-16 NOTE — Therapy (Signed)
[facility] Outpt Rehabilitation [facility] [address] [place], [place], [address] Phone: [phone]   Fax:  [phone]  Occupational Therapy Evaluation  Patient Details  Name: Amy [name] MRN: [id number] Date of Birth: [birth year] Referring Provider (OT): Clementeen Graham, MD   Encounter Date: 05/16/2021   OT End of Session - 05/16/21 1237     Visit Number 1    Number of Visits 7    Date for OT Re-Evaluation 07/14/21    Authorization Type Aetna    Authorization Time Period VL: 60 visits    OT Start Time 1230    OT Stop Time 1311    OT Time Calculation (min) 41 min    Activity Tolerance Patient tolerated treatment well;Patient limited by pain    Behavior During Therapy [facility] for tasks assessed/performed             Past Medical History:  Diagnosis Date   Anemia    Coronary artery disease    Diabetes mellitus without complication (HCC)    Excessive or frequent menstruation 09/10/2012   Hypertension    NSTEMI (non-ST elevated myocardial infarction) (HCC) 10/30/2019    Past Surgical History:  Procedure Laterality Date   CHOLECYSTECTOMY  2006   CORONARY ANGIOPLASTY     DILITATION & CURRETTAGE/HYSTROSCOPY WITH HYDROTHERMAL ABLATION N/A 07/10/2013   Procedure: DILATATION & CURETTAGE/HYSTEROSCOPY WITH HYDROTHERMAL ABLATION;  Surgeon: Brock Bad, MD;  Location: [facility];  Service: Gynecology;  Laterality: N/A;   LEFT HEART CATH AND CORONARY ANGIOGRAPHY N/A 11/02/2019   Procedure: LEFT HEART CATH AND CORONARY ANGIOGRAPHY;  Surgeon: Tonny Bollman, MD;  Location: [facility] INVASIVE CV LAB;  Service: Cardiovascular;  Laterality: N/A;   TUBAL LIGATION  1997   WISDOM TOOTH EXTRACTION  1998    There were no vitals filed for this visit.   Subjective Assessment - 05/16/21 1550     Subjective  Pt is a 45 year old female that presents to OPOT with right wrist pain. Pt received negative EMG for carpal tunnel and normal nerve response recently. Pt  continues to experience pain as high as 8/10 in R wrist that shoots proximal towards elbow. Pt reports some numbness and tingling in the whole hand and digits of RUE at times. Pt currently is wearing a right wrist cock up velcro brace at night only but reports sometimes causes pain. Pt reports pain has been present for about a year now since she had a catheter placed in RUE wrist. Pt is frustrated d/t inability to determine cause of pain at this time. Pt lives alone in an apartment and travels to [place] for work.    Pertinent History HTN, DM type 2    Patient Stated Goals "get my wrist back and figure out what's going on"    Currently in Pain? Yes    Pain Score 4     Pain Location Wrist    Pain Orientation Right    Pain Descriptors / Indicators Shooting    Pain Type Acute pain;Chronic pain;Neuropathic pain    Pain Onset More than a month ago    Pain Frequency Constant    Pain Relieving Factors uses Hulen Luster               [facility] OT Assessment - 05/16/21 1238       Assessment   Medical Diagnosis R wrist pain    Referring Provider (OT) Clementeen Graham, MD    Onset Date/Surgical Date --   approx over a year ago  Hand Dominance Right    Prior Therapy none      Precautions   Required Braces or Orthoses Other Brace/Splint    Other Brace/Splint wrist cock up RUE @ NOC      Home  Environment   Family/patient expects to be discharged to: Private residence    Living Arrangements Alone    Type of Home Aartment    Additional Comments able to access home      Prior Function   Level of Independence Independent    Vocation Full time employment    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   Leisure reading, looking at movies      ADL   Eating/Feeding Independent    Grooming Independent    Upper Body Bathing Independent    Lower Body Bathing Independent    Upper Body Dressing Independent    Lower Body Dressing Independent    Toilet  Transfer Independent    Toileting - Solicitor -  Physicist, medical Expression   Dominant Hand Right    Handwriting --   some pain while handwriting     Observation/Other Assessments   Focus on Therapeutic Outcomes (FOTO)  N/A      Sensation   Light Touch Impaired Detail    Light Touch Impaired Details Impaired RUE    Hot/Cold Appears Intact    Additional Comments pt reports numbness and tingling in RUE at wrist and traveling to elbow. Whole hand and fingers of RUE. Pt with neuropathic pain.      Coordination   9 Hole Peg Test Right;Left    Right 9 Hole Peg Test 22.84s    Left 9 Hole Peg Test 24.28s      ROM / Strength   AROM / PROM / Strength AROM;Strength      AROM   Overall AROM  Within functional limits for tasks performed      Strength   Overall Strength Within functional limits for tasks performed      Hand Function   Right Hand Gross Grasp Functional    Right Hand Grip (lbs) 71.6    Right Hand Lateral Pinch 12 lbs    Right Hand 3 Point Pinch 6 lbs    Left Hand Gross Grasp Functional    Left Hand Grip (lbs) 54    Left Hand Lateral Pinch 14 lbs    Left 3 point pinch 7 lbs    Comment Tip Pinch R 6 L 6                      OT Treatments/Exercises (OP) - 05/16/21 0001       Modalities   Modalities Ultrasound      Ultrasound   Ultrasound Location RUE wrist    Ultrasound Parameters 0.8 w/cm2, 8 minutes, 3.59mhz, pulsed 20% duty cycle    Ultrasound Goals Pain                      OT Short Term Goals - 05/16/21 1601       OT SHORT TERM GOAL #1   Title LTGs only               OT Long Term Goals - 05/16/21 1602       OT LONG TERM GOAL #1   Title Pt will be  independent with HEp    Time 8    Period Weeks    Status New    Target Date 07/14/21      OT LONG TERM GOAL #2   Title Pt will verbalize understanding of pain management  strategies    Time 8    Period Weeks    Status New      OT LONG TERM GOAL #3   Title Pt will report pain no greater than 6/10 with completion of HEP    Time 8    Period Weeks    Status New      OT LONG TERM GOAL #4   Title Pt will increase 3 point pinch to 7 lbs or greater and lateral pinch to 14 lbs or greater in RUE    Baseline 3 point: L 7, R 6 key: L 14, R 12    Time 8    Period Weeks    Status New                   Plan - 05/16/21 1314     Clinical Impression Statement Pt is a 45 year old female that presents to OPOT with right wrist pain. Pt with PMH significant for HTM and DM type 2. Pt reports pain began approx one year ago with placement of catheter in RUE wrist. Pt presents with numbness in RUE hand and digits, and significant pain in RUE wrist that is shooting in nature and as high as 8/10. Pt would benefit from skilled occupational therapy ot target the pain and sensation deficits, education and increase pinch strength in RUE.    OT Occupational Profile and History Problem Focused Assessment - Including review of records relating to presenting problem    Occupational performance deficits (Please refer to evaluation for details): ADL's;IADL's    Body Structure / Function / Physical Skills ADL;Pain;Strength;IADL;UE functional use;Decreased knowledge of use of DME;Sensation    Rehab Potential Good    Clinical Decision Making Limited treatment options, no task modification necessary    Comorbidities Affecting Occupational Performance: None    Modification or Assistance to Complete Evaluation  No modification of tasks or assist necessary to complete eval    OT Frequency 1x / week    OT Duration 8 weeks   6 visits over 8 weeks d/t any scheduling conflicts   OT Treatment/Interventions Ultrasound;Cryotherapy;Electrical Stimulation;Paraffin;Energy conservation;Manual Therapy;Patient/family education;Iontophoresis;Traction;Fluidtherapy;Self-care/ADL training;Moist  Heat;Therapeutic exercise;DME and/or AE instruction;Splinting;Therapeutic activities;Passive range of motion    Plan see how ultrasound went - maybe try fluido for wrist or [place] again, AROM wrist exercises RUE    Consulted and Agree with Plan of Care Patient             Patient will benefit from skilled therapeutic intervention in order to improve the following deficits and impairments:   Body Structure / Function / Physical Skills: ADL, Pain, Strength, IADL, UE functional use, Decreased knowledge of use of DME, Sensation       Visit Diagnosis: Pain in right wrist - Plan: Ot plan of care cert/re-cert  Other disturbances of skin sensation - Plan: Ot plan of care cert/re-cert    Problem List Patient Active Problem List   Diagnosis Date Noted   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   Essential hypertension 01/04/2020   Type 2 diabetes mellitus without complication, without long-term current use of insulin (HCC) 01/04/2020   Class 3 severe obesity due to excess calories with serious comorbidity and body mass index (  BMI) of 40.0 to 44.9 in adult [facility]) 01/04/2020   Coronary artery disease due to type 2 diabetes mellitus (HCC) 01/04/2020   Dental caries 01/04/2020    Junious Dresser, OT 05/16/2021, 4:07 PM  [facility] [address] [place], [place], [address] Phone: [phone]   Fax:  [phone]  Name: Alanee Ting MRN: [id number] Date of Birth: [birth year]

## 2021-05-24 ENCOUNTER — Encounter: Payer: Self-pay | Admitting: Occupational Therapy

## 2021-05-24 ENCOUNTER — Ambulatory Visit: Payer: 59 | Admitting: Occupational Therapy

## 2021-05-24 ENCOUNTER — Other Ambulatory Visit: Payer: Self-pay

## 2021-05-24 DIAGNOSIS — M25531 Pain in right wrist: Secondary | ICD-10-CM

## 2021-05-24 DIAGNOSIS — R208 Other disturbances of skin sensation: Secondary | ICD-10-CM

## 2021-05-24 NOTE — Patient Instructions (Signed)
AROM: Wrist Extension   .  With ____ palm down, bend wrist up. Repeat __15__ times per set.  Do __4-6__ sessions per day.    AROM: Wrist Flexion   With_____ palm up, bend wrist up. Repeat __15__ times per set.  Do _4-6___ sessions per day.   AROM: Forearm Pronation / Supination   With ____ arm in handshake position, slowly rotate palm down until stretch is felt. Relax. Then rotate palm up until stretch is felt. Repeat _15___ times per set. Do _4-6___ sessions per day.  Copyright  VHI. All rights reserved.      

## 2021-05-24 NOTE — Therapy (Signed)
[facility] Health Outpt Rehabilitation [facility] [address] [place], [place], [address] Phone: [phone]   Fax:  [phone]  Occupational Therapy Treatment  Patient Details  Name: Amy [name] MRN: [id number] Date of Birth: 20-Mar-1976 Referring Provider (OT): Clementeen Graham, MD   Encounter Date: 05/24/2021   OT End of Session - 05/24/21 1019     Visit Number 2    Number of Visits 7    Date for OT Re-Evaluation 07/14/21    Authorization Type Aetna    Authorization Time Period VL: 60 visits    OT Start Time 1017    OT Stop Time 1100    OT Time Calculation (min) 43 min    Activity Tolerance Patient tolerated treatment well;Patient limited by pain    Behavior During Therapy [facility] for tasks assessed/performed             Past Medical History:  Diagnosis Date   Anemia    Coronary artery disease    Diabetes mellitus without complication (HCC)    Excessive or frequent menstruation 09/10/2012   Hypertension    NSTEMI (non-ST elevated myocardial infarction) (HCC) 10/30/2019    Past Surgical History:  Procedure Laterality Date   CHOLECYSTECTOMY  2006   CORONARY ANGIOPLASTY     DILITATION & CURRETTAGE/HYSTROSCOPY WITH HYDROTHERMAL ABLATION N/A 07/10/2013   Procedure: DILATATION & CURETTAGE/HYSTEROSCOPY WITH HYDROTHERMAL ABLATION;  Surgeon: Brock Bad, MD;  Location: [facility];  Service: Gynecology;  Laterality: N/A;   LEFT HEART CATH AND CORONARY ANGIOGRAPHY N/A 11/02/2019   Procedure: LEFT HEART CATH AND CORONARY ANGIOGRAPHY;  Surgeon: Tonny Bollman, MD;  Location: [facility] INVASIVE CV LAB;  Service: Cardiovascular;  Laterality: N/A;   TUBAL LIGATION  1997   WISDOM TOOTH EXTRACTION  1998    There were no vitals filed for this visit.   Subjective Assessment - 05/24/21 1018     Subjective  "felt ok for a little while but then the pain just started" "the pain just happens - not when doing anything"    Pertinent History HTN, DM type 2    Patient  Stated Goals "get my wrist back and figure out what's going on"    Currently in Pain? Yes    Pain Score 10-Worst pain ever    Pain Location Wrist   more volar forearm region today   Pain Orientation Right    Pain Descriptors / Indicators Shooting    Pain Type Acute pain;Chronic pain;Neuropathic pain    Pain Onset More than a month ago    Pain Frequency Intermittent                          OT Treatments/Exercises (OP) - 05/24/21 1030       Exercises   Exercises Wrist      Wrist Exercises   Other wrist exercises AROM exercises - see pt instructions. issued as HEP.    Other wrist exercises resistance clothespins 1-8# with no report of increased pain      Modalities   Modalities Fluidotherapy      Ultrasound   Ultrasound Location RUE forearm (volar)    Ultrasound Parameters 0.8 w/cm2, 8 minutes, 3.3 mhz, continous    Ultrasound Goals Pain      RUE Fluidotherapy   Number Minutes Fluidotherapy 12 Minutes    RUE Fluidotherapy Location Wrist;Forearm    Comments for pain relief and stiffness before exercises  OT Education - 05/24/21 1045     Education Details AROM wrist RUE    Person(s) Educated Patient    Methods Explanation;Handout;Demonstration    Comprehension Verbalized understanding;Returned demonstration              OT Short Term Goals - 05/16/21 1601       OT SHORT TERM GOAL #1   Title LTGs only               OT Long Term Goals - 05/24/21 1021       OT LONG TERM GOAL #1   Title Pt will be independent with HEp    Time 8    Period Weeks    Status On-going    Target Date 07/14/21      OT LONG TERM GOAL #2   Title Pt will verbalize understanding of pain management strategies    Time 8    Period Weeks    Status New      OT LONG TERM GOAL #3   Title Pt will report pain no greater than 6/10 with completion of HEP    Time 8    Period Weeks    Status New      OT LONG TERM GOAL #4   Title Pt will  increase 3 point pinch to 7 lbs or greater and lateral pinch to 14 lbs or greater in RUE    Baseline 3 point: L 7, R 6 key: L 14, R 12    Time 8    Period Weeks    Status New                   Plan - 05/24/21 1020     Clinical Impression Statement Pt verbalized understanding of goals. Pt with increased pain today but reports relief after ultrasound. No adverse reactions today to ultrasound and fluido.    OT Occupational Profile and History Problem Focused Assessment - Including review of records relating to presenting problem    Occupational performance deficits (Please refer to evaluation for details): ADL's;IADL's    Body Structure / Function / Physical Skills ADL;Pain;Strength;IADL;UE functional use;Decreased knowledge of use of DME;Sensation    Rehab Potential Good    Clinical Decision Making Limited treatment options, no task modification necessary    Comorbidities Affecting Occupational Performance: None    Modification or Assistance to Complete Evaluation  No modification of tasks or assist necessary to complete eval    OT Frequency 1x / week    OT Duration 8 weeks   6 visits over 8 weeks d/t any scheduling conflicts   OT Treatment/Interventions Ultrasound;Cryotherapy;Electrical Stimulation;Paraffin;Energy conservation;Manual Therapy;Patient/family education;Iontophoresis;Traction;Fluidtherapy;Self-care/ADL training;Moist Heat;Therapeutic exercise;DME and/or AE instruction;Splinting;Therapeutic activities;Passive range of motion    Plan see how AROM wrist exercises are going, fluido and [place]    Consulted and Agree with Plan of Care Patient             Patient will benefit from skilled therapeutic intervention in order to improve the following deficits and impairments:   Body Structure / Function / Physical Skills: ADL, Pain, Strength, IADL, UE functional use, Decreased knowledge of use of DME, Sensation       Visit Diagnosis: Pain in right wrist  Other  disturbances of skin sensation    Problem List Patient Active Problem List   Diagnosis Date Noted   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   Essential hypertension 01/04/2020   Type 2 diabetes mellitus without complication, without long-term current  use of insulin (HCC) 01/04/2020   Class 3 severe obesity due to excess calories with serious comorbidity and body mass index (BMI) of 40.0 to 44.9 in adult [facility]) 01/04/2020   Coronary artery disease due to type 2 diabetes mellitus (HCC) 01/04/2020   Dental caries 01/04/2020    Junious Dresser, OT 05/24/2021, 11:08 AM  [facility] [place] [address] [place], [place], [address] Phone: [phone]   Fax:  [phone]  Name: Amy [name] MRN: [id number] Date of Birth: 13-Nov-1975

## 2021-05-30 NOTE — Progress Notes (Signed)
I, Wendy Poet, LAT, ATC, am serving as scribe for Dr. Lynne Leader.  Amy [name] is a 46 y.o. female who presents to [place] [facility] today for f/u of R wrist and distal forearm pain since June 2021 after having a cardiac catherization.  She was last seen by Dr. Georgina Snell on 05/02/21 and was encouraged to proceed w/ her previously ordered NCV/EMG that she's since had on 05/11/21.  She was also referred to OT of which she has completed 2 sessions.  She was also prescribed Gabapentin and advised to use a night wrist splint. Today, pt reports R wrist and forearm are improving. Pt has been compliant in wearing the night wrist splint. Pt is wondering if there is a medication to take during the day, esp while at work.  Tylenol is not sufficient to control her pain and gabapentin is too sedating.  Diagnostic testing: R UE NCV/EMG- 05/11/21  Pertinent review of systems: No fevers or chills  Relevant historical information: Hypertension.  Diabetes.  Kidney function normal when checked in November.   Exam:  BP (!) 142/98    Pulse 73    Ht 5\' 2"  (1.575 m)    Wt 221 lb 12.8 oz (100.6 kg)    SpO2 99%    BMI 40.57 kg/m  General: Well Developed, well nourished, and in no acute distress.   MSK: Right forearm normal-appearing Mildly tender palpation volar forearm.    Lab and Radiology Results  Nerve conduction study dated May 11, 2021  Patient Complaints: This is a 45 year old female referred for evaluation wrist pain.   NCV & EMG Findings: Extensive electrodiagnostic testing of the right upper extremity shows: Right median, ulnar, radial, and mixed palmar sensory responses are within normal limits. Right median and ulnar motor responses are within normal limits. There is no evidence of active or chronic motor axonal loss changes affecting any of the tested muscles.  Motor unit configuration and recruitment pattern is within normal limits.    Impression: This is a normal study of the right upper extremity.  In particular, there is no evidence of carpal tunnel syndrome, radial neuropathy, or cervical radiculopathy.     ___________________________ Narda Amber, DO     Assessment and Plan: 46 y.o. female with right forearm pain.  Pain occurred following a radial artery cardiac catheterization about a year and a half ago resulting in some bruising.  She had a pretty extensive work-up including arterial assessment and now on nerve conduction study that were effectively normal.  She is improving a bit with occupational therapy.  Plan to continue OT and reassess in a month.  Discussed pain control.  Continue gabapentin at bedtime.  We will add nortriptyline at bedtime and intermittent ibuprofen along with Tylenol during the day.  If not improved in a month consider x-ray and MRI.Marland Kitchen  This should be for targeted injection planning.   PDMP not reviewed this encounter. No orders of the defined types were placed in this encounter.  Meds ordered this encounter  Medications   ibuprofen (ADVIL) 600 MG tablet    Sig: Take 1 tablet (600 mg total) by mouth every 8 (eight) hours as needed.    Dispense:  90 tablet    Refill:  1   nortriptyline (PAMELOR) 25 MG capsule    Sig: Take 1 capsule (25 mg total) by mouth at bedtime. To prevent pain the next day    Dispense:  30 capsule    Refill:  2  Discussed warning signs or symptoms. Please see discharge instructions. Patient expresses understanding.   The above documentation has been reviewed and is accurate and complete Lynne Leader, M.D. Total encounter time 30 minutes including face-to-face time with the patient and, reviewing past medical record, and charting on the date of service.   Treatment plan and options and result discussion.

## 2021-05-31 ENCOUNTER — Other Ambulatory Visit: Payer: Self-pay

## 2021-05-31 ENCOUNTER — Telehealth: Payer: Self-pay

## 2021-05-31 ENCOUNTER — Ambulatory Visit (INDEPENDENT_AMBULATORY_CARE_PROVIDER_SITE_OTHER): Payer: 59 | Admitting: Family Medicine

## 2021-05-31 ENCOUNTER — Ambulatory Visit: Payer: 59 | Attending: Family Medicine | Admitting: Occupational Therapy

## 2021-05-31 VITALS — BP 142/98 | HR 73 | Ht 62.0 in | Wt 221.8 lb

## 2021-05-31 DIAGNOSIS — R208 Other disturbances of skin sensation: Secondary | ICD-10-CM | POA: Diagnosis not present

## 2021-05-31 DIAGNOSIS — M25531 Pain in right wrist: Secondary | ICD-10-CM | POA: Diagnosis not present

## 2021-05-31 MED ORDER — NORTRIPTYLINE HCL 25 MG PO CAPS
25.0000 mg | ORAL_CAPSULE | Freq: Every day | ORAL | 2 refills | Status: DC
  Filled 2021-05-31 – 2021-06-09 (×2): qty 30, 30d supply, fill #0

## 2021-05-31 MED ORDER — IBUPROFEN 600 MG PO TABS
600.0000 mg | ORAL_TABLET | Freq: Three times a day (TID) | ORAL | 1 refills | Status: DC | PRN
  Filled 2021-05-31 – 2021-06-09 (×2): qty 90, 30d supply, fill #0

## 2021-05-31 NOTE — Therapy (Signed)
[facility] [address] [place], [place], [address] Phone: [phone]   Fax:  [phone]  Occupational Therapy Treatment  Patient Details  Name: Amy [name] MRN: [id number] Date of Birth: 02/05/1976 Referring Provider (OT): Clementeen GrahamEvan Corey, MD   Encounter Date: 05/31/2021   OT End of Session - 05/31/21 1226     Visit Number 3    Number of Visits 7    Date for OT Re-Evaluation 07/14/21    Authorization Type [employer]    Authorization Time Period VL: 60 visits    OT Start Time 1103    OT Stop Time 1145    OT Time Calculation (min) 42 min    Activity Tolerance Patient tolerated treatment well;Patient limited by pain    Behavior [facility] tasks assessed/performed             Past Medical History:  Diagnosis Date   Anemia    Coronary artery disease    Diabetes mellitus without complication (HCC)    Excessive or frequent menstruation 09/10/2012   Hypertension    NSTEMI (non-ST elevated myocardial infarction) (HCC) 10/30/2019    Past Surgical History:  Procedure Laterality Date   CHOLECYSTECTOMY  2006   CORONARY ANGIOPLASTY     DILITATION & CURRETTAGE/HYSTROSCOPY WITH HYDROTHERMAL ABLATION N/A 07/10/2013   Procedure: DILATATION & CURETTAGE/HYSTEROSCOPY WITH HYDROTHERMAL ABLATION;  Surgeon: Brock Badharles A Harper, MD;  Location: [facility];  Service: Gynecology;  Laterality: N/A;   LEFT HEART CATH AND CORONARY ANGIOGRAPHY N/A 11/02/2019   Procedure: LEFT HEART CATH AND CORONARY ANGIOGRAPHY;  Surgeon: Tonny Bollmanooper, Michael, MD;  [facility];  Service: Cardiovascular;  Laterality: N/A;   TUBAL LIGATION  1997   WISDOM TOOTH EXTRACTION  1998    There were no vitals filed for this visit.   Subjective Assessment - 05/31/21 1108     Subjective  I saw Dr. Denyse Amassorey this morning    Pertinent History HTN, DM type 2    Patient Stated Goals "get my wrist back and figure out what's going on"    Currently in  Pain? Yes    Pain Score 9     Pain Location Wrist    Pain Orientation Left    Pain Descriptors / Indicators Shooting    Pain Type Chronic pain;Neuropathic pain    Pain Onset More than a month ago    Pain Frequency Intermittent    Aggravating Factors  unknown?    Pain Relieving Factors heat                          OT Treatments/Exercises (OP) - 05/31/21 0001       ADLs   ADL Comments Assessed pt's pain location and type of pain - pt also had pain w/ resistive elbow flex and pronation - ? pronator teres syndrome as this doesn't always show up as well on NCV and EMG. Will send note to referring MD w/ my suspicions. Recommended pt avoid repetitive pronation at work and suggested task modification for this (to see if this helps alleviate symptoms), also recommended wearing splint at work prn, monitor sleep position and watch diet as DM can also contributes to symptoms      Wrist Exercises   Other wrist exercises Reviewed A/ROM HEP for wrist. Pt also issued median n. gliding HEP and reviewed      RUE Fluidotherapy   Number Minutes Fluidotherapy 10 Minutes    RUE  Fluidotherapy Location Hand;Wrist    Comments to decrease pain at beginning of session                    OT Education - 05/31/21 1225     Education Details median n. gliding HEP    Person(s) Educated Patient    Methods Explanation;Handout;Demonstration    Comprehension Verbalized understanding;Returned demonstration              OT Short Term Goals - 05/16/21 1601       OT SHORT TERM GOAL #1   Title LTGs only               OT Long Term Goals - 05/31/21 1226       OT LONG TERM GOAL #1   Title Pt will be independent with HEp    Time 8    Period Weeks    Status On-going    Target Date 07/14/21      OT LONG TERM GOAL #2   Title Pt will verbalize understanding of pain management strategies    Time 8    Period Weeks    Status On-going      OT LONG TERM GOAL #3   Title Pt  will report pain no greater than 6/10 with completion of HEP    Time 8    Period Weeks    Status New      OT LONG TERM GOAL #4   Title Pt will increase 3 point pinch to 7 lbs or greater and lateral pinch to 14 lbs or greater in RUE    Baseline 3 point: L 7, R 6 key: L 14, R 12    Time 8    Period Weeks    Status New                   Plan - 05/31/21 1226     Clinical Impression Statement Pt w/ shooting pain mostly volar forearm, but also w/ resistive elbow flex and pronation. ? median n. 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 (d/t pain w/ resistive pronation and elbow flex) or anterior interosseous nerve syndrome (d/t decreased pinch strength)    OT Occupational Profile and History Problem Focused Assessment - Including review of records relating to presenting problem    Occupational performance deficits (Please refer to evaluation for details): ADL's;IADL's    Body Structure / Function / Physical Skills ADL;Pain;Strength;IADL;UE functional use;Decreased knowledge of use of DME;Sensation    Rehab Potential Good    Clinical Decision Making Limited treatment options, no task modification necessary    Comorbidities Affecting Occupational Performance: None    Modification or Assistance to Complete Evaluation  No modification of tasks or assist necessary to complete eval    OT Frequency 1x / week    OT Duration 8 weeks   6 visits over 8 weeks d/t any scheduling conflicts   OT Treatment/Interventions Ultrasound;Cryotherapy;Electrical Stimulation;Paraffin;Energy conservation;Manual Therapy;Patient/family education;Iontophoresis;Traction;Fluidtherapy;Self-care/ADL training;Moist Heat;Therapeutic exercise;DME and/or AE instruction;Splinting;Therapeutic activities;Passive range of motion    Plan Pulsed [place] volar forearm (to reduce possible inflammation and swelling over nerve and to increase blood flow), continue task modifications to avoid repetitive pronation and possible  A/E    Consulted and Agree with Plan of Care Patient             Patient will benefit from skilled therapeutic intervention in order to improve the following deficits and impairments:   Body Structure / Function / Physical Skills: ADL,  Pain, Strength, IADL, UE functional use, Decreased knowledge of use of DME, Sensation       Visit Diagnosis: Pain in right wrist  Other disturbances of skin sensation    Problem List Patient Active Problem List   Diagnosis Date Noted   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   Essential hypertension 01/04/2020   Type 2 diabetes mellitus without complication, without long-term current use of insulin (HCC) 01/04/2020   Class 3 severe obesity due to excess calories with serious comorbidity and body mass index (BMI) of 40.0 to 44.9 in adult [facility]) 01/04/2020   Coronary artery disease due to type 2 diabetes mellitus (HCC) 01/04/2020   Dental caries 01/04/2020    Kelli Churn, OTR/L 05/31/2021, 12:30 PM  [facility] [address] [place], [place], [address] Phone: [phone]   Fax:  [phone]  Name: Amy [name] MRN: [id number] Date of Birth: 1975/10/26

## 2021-05-31 NOTE — Telephone Encounter (Signed)
Hi Dr. Georgina Snell,   I saw this patient for the first time today (another O.T. did evaluation and first visit) and was initially perplexed with her symptoms of pain and location, especially since NCV was negative for CTS. 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 I am not as familiar with either of these, but w/ some research, the pronator syndrome doesn't always show up w/ nerve testing, and can have pain w/ resistive elbow flex and pronation, and DM also a risk factor. The latter can involve pronator quadratus but also FDS and FDP and show decreased pinch strength - which she had on initial evaluation. I'm not sure if I'm even on the right track, but I thought I would throw this idea out there so you can further assess w/ your expertise at her next appointment with you.   Thank you,   Redmond Baseman, OTR/L

## 2021-05-31 NOTE — Patient Instructions (Addendum)
Thank you for coming in today.   Continue hand therapy   Recheck back in 1 month

## 2021-06-08 ENCOUNTER — Encounter: Payer: Self-pay | Admitting: Occupational Therapy

## 2021-06-08 ENCOUNTER — Ambulatory Visit: Payer: 59 | Admitting: Occupational Therapy

## 2021-06-08 ENCOUNTER — Other Ambulatory Visit: Payer: Self-pay

## 2021-06-08 DIAGNOSIS — M25531 Pain in right wrist: Secondary | ICD-10-CM | POA: Diagnosis not present

## 2021-06-08 DIAGNOSIS — R208 Other disturbances of skin sensation: Secondary | ICD-10-CM | POA: Diagnosis not present

## 2021-06-08 NOTE — Therapy (Signed)
[facility] [address] [place], [place], [address] Phone: [phone]   Fax:  [phone]  Occupational Therapy Treatment  Patient Details  Name: Amy [name] MRN: [id number] Date of Birth: 01/17/1976 Referring Provider (OT): Clementeen Graham, MD   Encounter Date: 06/08/2021   OT End of Session - 06/08/21 1107     Visit Number 4    Number of Visits 7    Date for OT Re-Evaluation 07/14/21    Authorization Type [employer]    Authorization Time Period VL: 60 visits    OT Start Time 1102    OT Stop Time 1145    OT Time Calculation (min) 43 min    Activity Tolerance Patient tolerated treatment well;Patient limited by pain    Behavior During Therapy [facility] for tasks assessed/performed             Past Medical History:  Diagnosis Date   Anemia    Coronary artery disease    Diabetes mellitus without complication (HCC)    Excessive or frequent menstruation 09/10/2012   Hypertension    NSTEMI (non-ST elevated myocardial infarction) (HCC) 10/30/2019    Past Surgical History:  Procedure Laterality Date   CHOLECYSTECTOMY  2006   CORONARY ANGIOPLASTY     DILITATION & CURRETTAGE/HYSTROSCOPY WITH HYDROTHERMAL ABLATION N/A 07/10/2013   Procedure: DILATATION & CURETTAGE/HYSTEROSCOPY WITH HYDROTHERMAL ABLATION;  Surgeon: Brock Bad, MD;  Location: [facility];  Service: Gynecology;  Laterality: N/A;   LEFT HEART CATH AND CORONARY ANGIOGRAPHY N/A 11/02/2019   Procedure: LEFT HEART CATH AND CORONARY ANGIOGRAPHY;  Surgeon: Tonny Bollman, MD;  Location: [facility] INVASIVE CV LAB;  Service: Cardiovascular;  Laterality: N/A;   TUBAL LIGATION  1997   WISDOM TOOTH EXTRACTION  1998    There were no vitals filed for this visit.   Subjective Assessment - 06/08/21 1106     Subjective  Pt reports doing "ok" reports the "pain is getting worse"    Pertinent History HTN, DM type 2    Patient Stated Goals "get my wrist back and figure out what's  going on"    Currently in Pain? No/denies   no pain currently   Pain Score 0-No pain   has gotten up to 10/10 past few days   Pain Location Wrist    Pain Orientation Right    Pain Descriptors / Indicators Shooting    Pain Type Chronic pain;Neuropathic pain    Pain Onset More than a month ago    Pain Frequency Intermittent              Ultrasound 0.8w/cm2, 8 minutes, pulsed 20% duty cycle, to volar proximal forearm and 4 minutes to volar side of wrist  RUE  Pronator/Supinator Wheel x 10 reps in pain free zone  Discussed activity modifications for work related tasks to decrease repetitive pronation in RUE.   Picked up 1 inch blocks with wide set tweezers and emphasized rest break every 3 blocks for activity modification  Median Nerve flossing  Pronator stretches                  OT Education - 06/08/21 1140     Education Details median nerve flossing I - see pt instructions HEP    Person(s) Educated Patient    Methods Explanation;Handout;Demonstration    Comprehension Verbalized understanding;Returned demonstration              OT Short Term Goals - 05/16/21 1601  OT SHORT TERM GOAL #1   Title LTGs only               OT Long Term Goals - 05/31/21 1226       OT LONG TERM GOAL #1   Title Pt will be independent with HEp    Time 8    Period Weeks    Status On-going    Target Date 07/14/21      OT LONG TERM GOAL #2   Title Pt will verbalize understanding of pain management strategies    Time 8    Period Weeks    Status On-going      OT LONG TERM GOAL #3   Title Pt will report pain no greater than 6/10 with completion of HEP    Time 8    Period Weeks    Status New      OT LONG TERM GOAL #4   Title Pt will increase 3 point pinch to 7 lbs or greater and lateral pinch to 14 lbs or greater in RUE    Baseline 3 point: L 7, R 6 key: L 14, R 12    Time 8    Period Weeks    Status New                   Plan -  06/08/21 1157     Clinical Impression Statement Pt reports increased pain since last session. 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    OT Occupational Profile and History Problem Focused Assessment - Including review of records relating to presenting problem    Occupational performance deficits (Please refer to evaluation for details): ADL's;IADL's    Body Structure / Function / Physical Skills ADL;Pain;Strength;IADL;UE functional use;Decreased knowledge of use of DME;Sensation    Rehab Potential Good    Clinical Decision Making Limited treatment options, no task modification necessary    Comorbidities Affecting Occupational Performance: None    Modification or Assistance to Complete Evaluation  No modification of tasks or assist necessary to complete eval    OT Frequency 1x / week    OT Duration 8 weeks   6 visits over 8 weeks d/t any scheduling conflicts   OT Treatment/Interventions Ultrasound;Cryotherapy;Electrical Stimulation;Paraffin;Energy conservation;Manual Therapy;Patient/family education;Iontophoresis;Traction;Fluidtherapy;Self-care/ADL training;Moist Heat;Therapeutic exercise;DME and/or AE instruction;Splinting;Therapeutic activities;Passive range of motion    Plan Pulsed [place] volar forearm (to reduce possible inflammation and swelling over nerve and to increase blood flow), continue task modifications to avoid repetitive pronation and possible A/E    Consulted and Agree with Plan of Care Patient             Patient will benefit from skilled therapeutic intervention in order to improve the following deficits and impairments:   Body Structure / Function / Physical Skills: ADL, Pain, Strength, IADL, UE functional use, Decreased knowledge of use of DME, Sensation       Visit Diagnosis: Other disturbances of skin sensation  Pain in right wrist    Problem List Patient Active Problem List   Diagnosis Date Noted   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    Essential hypertension 01/04/2020   Type 2 diabetes mellitus without complication, without long-term current use of insulin (HCC) 01/04/2020   Class 3 severe obesity due to excess calories with serious comorbidity and body mass index (BMI) of 40.0 to 44.9 in adult [facility]) 01/04/2020   Coronary artery disease due to type 2 diabetes mellitus (HCC) 01/04/2020   Dental caries 01/04/2020  Junious DresserKirstyn M Nalia Honeycutt, OT 06/08/2021, 12:18 PM  [facility] [address] [place], [place], [address] Phone: [phone]   Fax:  [phone]  Name: Amy [name] MRN: [id number] Date of Birth: 11/17/1975

## 2021-06-08 NOTE — Patient Instructions (Signed)
MEDIAN NERVE: Flossing I    With right elbow bent and palm facing up as if holding a tray, head tilted away. Simultaneously straighten arm and tilt head toward involved shoulder. Do ___ sets of ___ repetitions per session. Do ___ sessions per day.  Copyright  VHI. All rights reserved.   

## 2021-06-09 ENCOUNTER — Other Ambulatory Visit: Payer: Self-pay

## 2021-06-13 ENCOUNTER — Ambulatory Visit: Payer: 59 | Admitting: Occupational Therapy

## 2021-06-14 ENCOUNTER — Other Ambulatory Visit: Payer: Self-pay

## 2021-06-14 ENCOUNTER — Ambulatory Visit: Payer: 59 | Attending: Critical Care Medicine | Admitting: Critical Care Medicine

## 2021-06-14 ENCOUNTER — Encounter: Payer: Self-pay | Admitting: Critical Care Medicine

## 2021-06-14 VITALS — BP 126/70 | HR 72 | Resp 16 | Wt 221.0 lb

## 2021-06-14 DIAGNOSIS — E1159 Type 2 diabetes mellitus with other circulatory complications: Secondary | ICD-10-CM

## 2021-06-14 DIAGNOSIS — Z6841 Body Mass Index (BMI) 40.0 and over, adult: Secondary | ICD-10-CM

## 2021-06-14 DIAGNOSIS — I2510 Atherosclerotic heart disease of native coronary artery without angina pectoris: Secondary | ICD-10-CM

## 2021-06-14 DIAGNOSIS — E119 Type 2 diabetes mellitus without complications: Secondary | ICD-10-CM

## 2021-06-14 DIAGNOSIS — I10 Essential (primary) hypertension: Secondary | ICD-10-CM | POA: Diagnosis not present

## 2021-06-14 DIAGNOSIS — J301 Allergic rhinitis due to pollen: Secondary | ICD-10-CM | POA: Diagnosis not present

## 2021-06-14 LAB — GLUCOSE, POCT (MANUAL RESULT ENTRY): POC Glucose: 157 mg/dl — AB (ref 70–99)

## 2021-06-14 MED ORDER — FLUTICASONE PROPIONATE 50 MCG/ACT NA SUSP
2.0000 | Freq: Every day | NASAL | 6 refills | Status: DC
  Filled 2021-06-14 – 2021-11-21 (×3): qty 16, 30d supply, fill #0

## 2021-06-14 NOTE — Patient Instructions (Signed)
No change in medications  Trial flonase two puff each nostril daily, sent to our pharmacy Continue your lifestyle management efforts Return Dr Delford Field 3 months  We will schedule a PAP smear visit with one of our providers over the next three months

## 2021-06-14 NOTE — Assessment & Plan Note (Signed)
Continue Jardiance as prescribed. Continue with lifestyle changes as discussed.

## 2021-06-14 NOTE — Assessment & Plan Note (Signed)
Continue taking Allegra daily to manage allergy symptoms. Rx for fluticasone nasal spray sent to pharmacy to help with sinus pressure and congestion.

## 2021-06-14 NOTE — Progress Notes (Signed)
Established Patient Office Visit  Subjective:  Patient ID: Amy [name], female    DOB: 1976-05-20  Age: 46 y.o. MRN: [id number]  CC:  Chief Complaint  Patient presents with   Diabetes    HPI Reid Darden-Jones presents for primary care follow up for hypertension and Type 2 Diabetes. At her last visit, her medications for hypertension were changed due to side effects. She is now taking valsartan-HCTZ 80-12.5 mg and carvedilol 25 mg twice daily. She says this combination is working very well for her and she has no side effects. Her in office readings were 147/102 and 144/78. She says this is much higher than her home readings. She takes home blood pressures, and states that they typically are in the 120s/80s. This morning's reading was 126/70.   She states she has been taking her Jardiance for her diabetes as directed. She reports no symptoms from her diabetes. She takes her blood sugars once daily, typically in the morning. This typically measures between 120-130 mg/dL. She will occasionally do a second reading in the evening.   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 She says she now is able to focus on her health much more. She says her sleep has improved drastically. She has started engaging in more aggressive lifestyle management with her diet. She says she is eating more plant-based foods. She tries to eat salads with a variety of greens and fruit for lunch. She doesn't enjoy eating meat much, but she is eating salmon and other fish. She has found that by making these changes, she has started craving nutrient-dense foods over processed foods. She says her husband is very supportive of this effort and is also working on improving his diet, so she feels much more committed to keeping this lifestyle change up. She has not started exercising yet, but is interested in walking.   She has seen physical  medicine and rehabilitation, neurology, and occupational therapy for persistent pain in her wrist that began after her cardiac catheterization. She had nerve conduction testing that was negative for abnormalities. She is planning on having some imaging that was ordered by orthopedics for targeted injections. She has started nortriptyline for the pain and this has helped. She doesn't think the occupational therapy is very helpful.   Her only complaint today is some sinus congestion and pressure. She believes this is due to seasonal allergies. She typically takes Allegra/fexofenadine daily for allergies.  She is due for colon cancer screening. She has scheduled this for February 6 with GI.   Past Medical History:  Diagnosis Date   Anemia    Coronary artery disease    Diabetes mellitus without complication ([place])    Excessive or frequent menstruation 09/10/2012   Hypertension    NSTEMI (non-ST elevated myocardial infarction) ([place]) 10/30/2019    Past Surgical History:  Procedure Laterality Date   CHOLECYSTECTOMY  2006   CORONARY ANGIOPLASTY     DILITATION & CURRETTAGE/HYSTROSCOPY WITH HYDROTHERMAL ABLATION N/A 07/10/2013   Procedure: DILATATION & CURETTAGE/HYSTEROSCOPY WITH HYDROTHERMAL ABLATION;  Surgeon: Shelly Bombard, MD;  Location: [facility];  Service: Gynecology;  Laterality: N/A;   LEFT HEART CATH AND CORONARY ANGIOGRAPHY N/A 11/02/2019   Procedure: LEFT HEART CATH AND CORONARY ANGIOGRAPHY;  Surgeon: Sherren Mocha, MD;  Location: [facility];  Service: Cardiovascular;  Laterality: N/A;   [place] EXTRACTION  1998  Family History  Problem Relation Age of Onset   Breast cancer Mother    Hypertension Mother    Hypertension Father    Heart failure Maternal Aunt    COPD Other    Hypertension Other    Diabetes Other    Cancer Other     Social History   Socioeconomic History   Marital status: Married    Spouse name: Engineer, maintenance   Number of  children: 2   Years of education: Not on file   Highest education level: Some college, no degree  Occupational History   Occupation: Animal nutritionist     Comment: [employer], forensic  Tobacco Use   Smoking status: Never   Smokeless tobacco: Never  Vaping Use   Vaping Use: Never used  Substance and Sexual Activity   Alcohol use: No   Drug use: No   Sexual activity: Not on file  Other Topics Concern   Not on file  Social History Narrative   Lives locally.  Does not routinely exercise.  In school @ [employer] for nsg.         Lives in a one story home    Right Handed    Social Determinants of Health   Financial Resource Strain: Not on file  Food Insecurity: Not on file  Transportation Needs: Not on file  Physical Activity: Not on file  Stress: Not on file  Social Connections: Not on file  Intimate Partner Violence: Not on file    Outpatient Medications Prior to Visit  Medication Sig Dispense Refill   aspirin 81 MG EC tablet Take 1 tablet (81 mg total) by mouth daily. 90 tablet 3   atorvastatin (LIPITOR) 40 MG tablet TAKE 1 TABLET (40 MG TOTAL) BY MOUTH DAILY. 90 tablet 3   Blood Glucose Monitoring Suppl (TRUE METRIX METER) w/Device KIT Use to check blood sugars twice per day 1 kit 0   carvedilol (COREG) 25 MG tablet Take 1 tablet (25 mg total) by mouth 2 (two) times daily with a meal. 60 tablet 4   empagliflozin (JARDIANCE) 10 MG TABS tablet Take 1 tablet (10 mg total) by mouth daily. 30 tablet 3   fexofenadine (ALLEGRA) 180 MG tablet Take 180 mg by mouth daily.     glucose blood (TRUE METRIX BLOOD GLUCOSE TEST) test strip Use as instructed to check BS twice per day 100 each 12   ibuprofen (ADVIL) 600 MG tablet Take 1 tablet (600 mg total) by mouth every 8 (eight) hours as needed. 90 tablet 1   nitroGLYCERIN (NITROSTAT) 0.4 MG SL tablet Place 1 tablet (0.4 mg total) under the tongue every 5 (five) minutes x 3 doses as needed for chest pain. 25 tablet 12   nortriptyline  (PAMELOR) 25 MG capsule Take 1 capsule (25 mg total) by mouth at bedtime. To prevent pain the next day 30 capsule 2   TRUEplus Lancets 28G MISC Use when checking BS twice per day 100 each 11   valsartan-hydrochlorothiazide (DIOVAN-HCT) 80-12.5 MG tablet Take 1 tablet by mouth daily. 90 tablet 3   gabapentin (NEURONTIN) 300 MG capsule Take 1 capsule (300 mg total) by mouth at bedtime. (Patient not taking: Reported on 06/14/2021) 30 capsule 2   Multiple Vitamin (MULTIVITAMIN WITH MINERALS) TABS tablet Take 1 tablet by mouth daily.     No facility-administered medications prior to visit.    Allergies  Allergen Reactions   Amlodipine Hives   Hydrocodone-Acetaminophen Hives and Itching    And itching Itching  and hives   Losartan Potassium-Hctz Other (See Comments)    Dizziness   Tetanus Antitoxin     Arm swelling size of a softball.    Tetanus Toxoids     Arm swelling size of a softball.     ROS Review of Systems  Constitutional:  Positive for fever. Negative for fatigue.  HENT:  Positive for congestion and sinus pressure. Negative for hearing loss.   Eyes:  Negative for itching and visual disturbance.  Respiratory:  Negative for cough and shortness of breath.   Cardiovascular:  Negative for chest pain and palpitations.  Gastrointestinal:  Negative for diarrhea, nausea and vomiting.  Genitourinary:  Negative for dysuria and frequency.  Musculoskeletal:  Negative for back pain.  Skin:  Negative for rash.  Neurological:  Negative for headaches.  Psychiatric/Behavioral:  Negative for dysphoric mood and sleep disturbance. The patient is not nervous/anxious.      Objective:    Physical Exam Constitutional:      Appearance: Normal appearance. She is obese.  HENT:     Head: Normocephalic and atraumatic.     Nose: Congestion present.     Mouth/Throat:     Mouth: Mucous membranes are moist.  Eyes:     General: No scleral icterus. Cardiovascular:     Rate and Rhythm: Normal rate  and regular rhythm.     Pulses: Normal pulses.     Heart sounds: Normal heart sounds. No murmur heard.   No friction rub. No gallop.  Pulmonary:     Effort: Pulmonary effort is normal.     Breath sounds: Normal breath sounds. No wheezing, rhonchi or rales.  Musculoskeletal:        General: Normal range of motion.  Skin:    General: Skin is warm and dry.  Neurological:     General: No focal deficit present.     Mental Status: She is alert and oriented to person, place, and time.  Psychiatric:        Mood and Affect: Mood normal.        Behavior: Behavior normal.        Thought Content: Thought content normal.        Judgment: Judgment normal.    BP 126/70 Comment: home reading   Pulse 72    Resp 16    Wt 221 lb (100.2 kg)    SpO2 96%    BMI 40.42 kg/m  Wt Readings from Last 3 Encounters:  06/14/21 221 lb (100.2 kg)  05/31/21 221 lb 12.8 oz (100.6 kg)  05/02/21 220 lb 9.6 oz (100.1 kg)     Health Maintenance Due  Topic Date Due   COLONOSCOPY (Pts 45-89yr Insurance coverage will need to be confirmed)  Never done   COVID-19 Vaccine (2 - [place] risk series) 03/02/2020   PAP SMEAR-Modifier  08/06/2021    There are no preventive care reminders to display for this patient.  No results found for: TSH Lab Results  Component Value Date   WBC 7.6 04/12/2021   HGB 11.3 04/12/2021   HCT 34.6 04/12/2021   MCV 85 04/12/2021   PLT 324 04/12/2021   Lab Results  Component Value Date   NA 140 04/12/2021   K 4.0 04/12/2021   CO2 21 04/12/2021   GLUCOSE 122 (H) 04/12/2021   BUN 13 04/12/2021   CREATININE 0.73 04/12/2021   BILITOT 0.2 04/12/2021   ALKPHOS 91 04/12/2021   AST 15 04/12/2021   ALT 8 04/12/2021   PROT  7.9 04/12/2021   ALBUMIN 4.0 04/12/2021   CALCIUM 9.4 04/12/2021   ANIONGAP 10 11/02/2019   EGFR 102 04/12/2021   Lab Results  Component Value Date   CHOL 168 10/19/2020   Lab Results  Component Value Date   HDL 44 10/19/2020   Lab Results  Component  Value Date   LDLCALC 112 (H) 10/19/2020   Lab Results  Component Value Date   TRIG 63 10/19/2020   Lab Results  Component Value Date   CHOLHDL 3.8 10/19/2020   Lab Results  Component Value Date   HGBA1C 6.6 (H) 04/12/2021      Assessment & Plan:   Problem List Items Addressed This Visit       Cardiovascular and Mediastinum   Essential hypertension    Continue on current BP medication regimen. Continue taking home blood pressure readings and recording these.   Patient has taken definitive action to improve her diet and lifestyle management. Extensive counseling on diet provided today. Continue with diet high in fruits, vegetables, whole grains, and lean meats/fish.       Coronary artery disease due to type 2 diabetes mellitus ([place])    CAD is stable at this time, continue with atorvastatin. Follow up with cardiology as planned.        Respiratory   Seasonal allergic rhinitis due to pollen    Continue taking Allegra daily to manage allergy symptoms. Rx for fluticasone nasal spray sent to pharmacy to help with sinus pressure and congestion.       Relevant Medications   fluticasone (FLONASE) 50 MCG/ACT nasal spray     Endocrine   Type 2 diabetes mellitus without complication, without long-term current use of insulin ([place]) - Primary    Continue Jardiance as prescribed. Continue with lifestyle changes as discussed.       Relevant Orders   POCT glucose (manual entry) (Completed)     Other   Class 3 severe obesity due to excess calories with serious comorbidity and body mass index (BMI) of 40.0 to 44.9 in adult [facility])    Continue with dietary adherence as discussed. Begin introducing exercise/movement routine 3-4x weekly for 30 minutes.        Meds ordered this encounter  Medications   fluticasone (FLONASE) 50 MCG/ACT nasal spray    Sig: Place 2 sprays into both nostrils daily.    Dispense:  16 g    Refill:  6    Follow-up: Return in about 3 months (around  09/12/2021).    Asencion Noble, MD

## 2021-06-14 NOTE — Assessment & Plan Note (Signed)
Continue with dietary adherence as discussed. Begin introducing exercise/movement routine 3-4x weekly for 30 minutes.

## 2021-06-14 NOTE — Assessment & Plan Note (Signed)
Continue on current BP medication regimen. Continue taking home blood pressure readings and recording these.   Patient has taken definitive action to improve her diet and lifestyle management. Extensive counseling on diet provided today. Continue with diet high in fruits, vegetables, whole grains, and lean meats/fish.

## 2021-06-14 NOTE — Assessment & Plan Note (Signed)
CAD is stable at this time, continue with atorvastatin. Follow up with cardiology as planned.

## 2021-06-19 ENCOUNTER — Other Ambulatory Visit: Payer: Self-pay

## 2021-06-19 ENCOUNTER — Other Ambulatory Visit: Payer: Self-pay | Admitting: Critical Care Medicine

## 2021-06-19 ENCOUNTER — Ambulatory Visit (AMBULATORY_SURGERY_CENTER): Payer: 59 | Admitting: *Deleted

## 2021-06-19 VITALS — Ht 62.0 in | Wt 221.0 lb

## 2021-06-19 DIAGNOSIS — Z1211 Encounter for screening for malignant neoplasm of colon: Secondary | ICD-10-CM

## 2021-06-19 MED ORDER — NITROGLYCERIN 0.4 MG SL SUBL
0.4000 mg | SUBLINGUAL_TABLET | SUBLINGUAL | 11 refills | Status: AC | PRN
  Filled 2021-06-19: qty 25, 1d supply, fill #0
  Filled 2021-06-27: qty 25, 10d supply, fill #0
  Filled 2022-05-25: qty 25, 8d supply, fill #0

## 2021-06-19 MED ORDER — NA SULFATE-K SULFATE-MG SULF 17.5-3.13-1.6 GM/177ML PO SOLN
1.0000 | Freq: Once | ORAL | 0 refills | Status: AC
  Filled 2021-06-19: qty 354, 1d supply, fill #0

## 2021-06-19 NOTE — Telephone Encounter (Signed)
Requested Prescriptions  Pending Prescriptions Disp Refills   nitroGLYCERIN (NITROSTAT) 0.4 MG SL tablet 25 tablet 12    Sig: Place 1 tablet (0.4 mg total) under the tongue every 5 (five) minutes x 3 doses as needed for chest pain.     Cardiovascular:  Nitrates Passed - 06/19/2021  3:54 PM      Passed - Last BP in normal range    BP Readings from Last 1 Encounters:  06/14/21 126/70         Passed - Last Heart Rate in normal range    Pulse Readings from Last 1 Encounters:  06/14/21 72         Passed - Valid encounter within last 12 months    Recent Outpatient Visits          5 days ago Essential hypertension   [facility] Storm Frisk, MD   1 month ago Type 2 diabetes mellitus without complication, without long-term current use of insulin (HCC)   [place] [facility] Storm Frisk, MD   5 months ago Type 2 diabetes mellitus without complication, without long-term current use of insulin (HCC)   [facility] Storm Frisk, MD   8 months ago Essential hypertension   [place] [facility] Rochelle, Archie Patten S, NP   1 year ago Type 2 diabetes mellitus without complication, without long-term current use of insulin [facility])   [facility] Storm Frisk, MD      Future Appointments            In 2 weeks Rodolph Bong, MD [facility]   In 3 weeks Sharon Seller, [place] [facility]   In 2 months Delford Field, Charlcie Cradle, MD [facility]

## 2021-06-19 NOTE — Progress Notes (Signed)
No egg or soy allergy known to patient  No issues known to pt with past sedation with any surgeries or procedures Patient denies ever being told they had issues or difficulty with intubation  No FH of Malignant Hyperthermia Pt is not on diet pills Pt is not on  home 02  Pt is not on blood thinners  Pt denies issues with constipation  No A fib or A flutter  Pt is fully vaccinated  for Covid   NO PA's for preps discussed with pt In PV today  Discussed with pt there will be an out-of-pocket cost for prep and that varies from $0 to 70 +  dollars - pt verbalized understanding   Due to the COVID-19 pandemic we are asking patients to follow certain guidelines in PV and the [facility]   Pt aware of COVID protocols and [facility] guidelines   PV completed over the phone. Pt verified name, DOB, address and insurance during PV today.  Pt mailed instruction packet with copy of consent form to read and not return, and instructions. Also emailed pt instructions at [email or web address] as she does not have my chart   Pt encouraged to call with questions or issues.  If pt has My chart, procedure instructions sent via My Chart

## 2021-06-20 ENCOUNTER — Other Ambulatory Visit: Payer: Self-pay

## 2021-06-22 ENCOUNTER — Telehealth: Payer: Self-pay

## 2021-06-22 ENCOUNTER — Ambulatory Visit: Payer: 59 | Admitting: Occupational Therapy

## 2021-06-22 NOTE — Telephone Encounter (Signed)
Called and left message re: today's missed O.T. appointment, and reminded pt of next scheduled appt. Pt asked to call back if she cannot make this appt and gave phone number on voice message.

## 2021-06-27 ENCOUNTER — Ambulatory Visit: Payer: 59 | Admitting: Occupational Therapy

## 2021-06-27 ENCOUNTER — Other Ambulatory Visit: Payer: Self-pay

## 2021-06-27 DIAGNOSIS — R208 Other disturbances of skin sensation: Secondary | ICD-10-CM

## 2021-06-27 DIAGNOSIS — M25531 Pain in right wrist: Secondary | ICD-10-CM

## 2021-06-27 NOTE — Therapy (Signed)
[facility] [address] [place], [place], [address] Phone: [phone]   Fax:  [phone]  Occupational Therapy Treatment  Patient Details  Name: Amy [name] MRN: [id number] Date of Birth: 06-Aug-1975 Referring Provider (OT): Clementeen Graham, MD   Encounter Date: 06/27/2021   OT End of Session - 06/27/21 1142     Visit Number 5    Number of Visits 7    Date for OT Re-Evaluation 07/14/21    Authorization Type [employer]    Authorization Time Period VL: 60 visits    OT Start Time 1100    OT Stop Time 1138    OT Time Calculation (min) 38 min    Activity Tolerance Patient tolerated treatment well;Patient limited by pain    Behavior During Therapy [facility] for tasks assessed/performed             Past Medical History:  Diagnosis Date   Anemia    Coronary artery disease    Diabetes mellitus without complication (HCC)    Excessive or frequent menstruation 09/10/2012   Hyperlipidemia    Hypertension    NSTEMI (non-ST elevated myocardial infarction) (HCC) 10/30/2019   PONV (postoperative nausea and vomiting)    with ablation only    Past Surgical History:  Procedure Laterality Date   CHOLECYSTECTOMY  2006   CORONARY ANGIOPLASTY     DILITATION & CURRETTAGE/HYSTROSCOPY WITH HYDROTHERMAL ABLATION N/A 07/10/2013   Procedure: DILATATION & CURETTAGE/HYSTEROSCOPY WITH HYDROTHERMAL ABLATION;  Surgeon: Brock Bad, MD;  Location: [facility];  Service: Gynecology;  Laterality: N/A;   LEFT HEART CATH AND CORONARY ANGIOGRAPHY N/A 11/02/2019   Procedure: LEFT HEART CATH AND CORONARY ANGIOGRAPHY;  Surgeon: Tonny Bollman, MD;  Location: [facility];  Service: Cardiovascular;  Laterality: N/A;   TUBAL LIGATION  1997   WISDOM TOOTH EXTRACTION  1998    There were no vitals filed for this visit.   Subjective Assessment - 06/27/21 1140     Subjective  Pt reports wearing brace and doing nerve gliding ex is helping along  with some task modifications at work    Pertinent History HTN, DM type 2    Patient Stated Goals "get my wrist back and figure out what's going on"    Currently in Pain? Yes    Pain Score --   fluctuates   Pain Location --   volar wrist and forearm   Pain Orientation Right    Pain Descriptors / Indicators Sore;Shooting;Tingling    Pain Type Chronic pain;Neuropathic pain    Pain Onset More than a month ago    Pain Frequency Intermittent    Aggravating Factors  work tasks    Pain Relieving Factors heat             Further discussed pronator syndrome and task modifications, positions to avoid including repetitive or sustained grip especially in pronation.   Continuous [place] over proximal volar forearm 3 Mhz, 0.8 wts/cm2 x 8 minutes.   Kinesiotaping to relax pronator teres (however this muscle runs deep and unsure if this will help w/ pain)   Reviewed median nerve gliding ex (flossing #1) and reprinted for patient. Pt also shown different median nerve gliding ex via you tube video.                        OT Short Term Goals - 05/16/21 1601       OT SHORT TERM GOAL #1   Title LTGs  only               OT Long Term Goals - 06/27/21 1142       OT LONG TERM GOAL #1   Title Pt will be independent with HEp    Time 8    Period Weeks    Status On-going    Target Date 07/14/21      OT LONG TERM GOAL #2   Title Pt will verbalize understanding of pain management strategies    Time 8    Period Weeks    Status On-going      OT LONG TERM GOAL #3   Title Pt will report pain no greater than 6/10 with completion of HEP    Time 8    Period Weeks    Status On-going      OT LONG TERM GOAL #4   Title Pt will increase 3 point pinch to 7 lbs or greater and lateral pinch to 14 lbs or greater in RUE    Baseline 3 point: L 7, R 6 key: L 14, R 12    Time 8    Period Weeks    Status New                   Plan - 06/27/21 1142     Clinical Impression  Statement Pt reports fluctuating pain, but does report some relief w/ task modifications at work, heat, wearing brace, and nerve gliding ex    OT Occupational Profile and History Problem Focused Assessment - Including review of records relating to presenting problem    Occupational performance deficits (Please refer to evaluation for details): ADL's;IADL's    Body Structure / Function / Physical Skills ADL;Pain;Strength;IADL;UE functional use;Decreased knowledge of use of DME;Sensation    Rehab Potential Good    Clinical Decision Making Limited treatment options, no task modification necessary    Comorbidities Affecting Occupational Performance: None    Modification or Assistance to Complete Evaluation  No modification of tasks or assist necessary to complete eval    OT Frequency 1x / week    OT Duration 8 weeks   6 visits over 8 weeks d/t any scheduling conflicts   OT Treatment/Interventions Ultrasound;Cryotherapy;Electrical Stimulation;Paraffin;Energy conservation;Manual Therapy;Patient/family education;Iontophoresis;Traction;Fluidtherapy;Self-care/ADL training;Moist Heat;Therapeutic exercise;DME and/or AE instruction;Splinting;Therapeutic activities;Passive range of motion    Plan Pt wishes to see Dr. Denyse Amass again on 07/05/21 before making more appts - pt was instructed to call back to let therapist know if she wishes to continue/make more appts or wishes to d/c    Consulted and Agree with Plan of Care Patient             Patient will benefit from skilled therapeutic intervention in order to improve the following deficits and impairments:   Body Structure / Function / Physical Skills: ADL, Pain, Strength, IADL, UE functional use, Decreased knowledge of use of DME, Sensation       Visit Diagnosis: Other disturbances of skin sensation  Pain in right wrist    Problem List Patient Active Problem List   Diagnosis Date Noted   Seasonal allergic rhinitis due to pollen 06/14/2021    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   Essential hypertension 01/04/2020   Type 2 diabetes mellitus without complication, without long-term current use of insulin (HCC) 01/04/2020   Class 3 severe obesity due to excess calories with serious comorbidity and body mass index (BMI) of 40.0 to 44.9 in adult [facility]) 01/04/2020   Coronary artery  disease due to type 2 diabetes mellitus (HCC) 01/04/2020   Dental caries 01/04/2020    Kelli ChurnBallie, Camary Sosa Johnson, OTR/L 06/27/2021, 11:46 AM  [facility] [address] [place], [place], [address] Phone: [phone]   Fax:  [phone]  Name: Ola SpurrShaketta Darden-Jones MRN: [id number] Date of Birth: 07/12/1975

## 2021-06-29 ENCOUNTER — Encounter: Payer: Self-pay | Admitting: Gastroenterology

## 2021-07-03 ENCOUNTER — Encounter: Payer: Self-pay | Admitting: Gastroenterology

## 2021-07-03 ENCOUNTER — Other Ambulatory Visit: Payer: Self-pay

## 2021-07-03 ENCOUNTER — Ambulatory Visit (AMBULATORY_SURGERY_CENTER): Payer: 59 | Admitting: Gastroenterology

## 2021-07-03 VITALS — BP 174/88 | HR 69 | Temp 97.8°F | Resp 13 | Ht 62.0 in | Wt 221.0 lb

## 2021-07-03 DIAGNOSIS — I2510 Atherosclerotic heart disease of native coronary artery without angina pectoris: Secondary | ICD-10-CM | POA: Diagnosis not present

## 2021-07-03 DIAGNOSIS — I10 Essential (primary) hypertension: Secondary | ICD-10-CM | POA: Diagnosis not present

## 2021-07-03 DIAGNOSIS — E119 Type 2 diabetes mellitus without complications: Secondary | ICD-10-CM | POA: Diagnosis not present

## 2021-07-03 DIAGNOSIS — Z1211 Encounter for screening for malignant neoplasm of colon: Secondary | ICD-10-CM | POA: Diagnosis not present

## 2021-07-03 NOTE — Progress Notes (Signed)
Pt's states no medical or surgical changes since previsit or office visit. 

## 2021-07-03 NOTE — Progress Notes (Signed)
History and Physical:  This patient presents for endoscopic testing for: Encounter Diagnosis  Name Primary?   Special screening for malignant neoplasms, colon Yes    First screening exam. Patient denies chronic abdominal pain, rectal bleeding, constipation or diarrhea.   ROS: Patient denies chest pain or shortness of breath today   Past Medical History: Past Medical History:  Diagnosis Date   Anemia    Coronary artery disease    Diabetes mellitus without complication (HCC)    Excessive or frequent menstruation 09/10/2012   Hyperlipidemia    Hypertension    NSTEMI (non-ST elevated myocardial infarction) ([place]) 10/30/2019   PONV (postoperative nausea and vomiting)    with ablation only     Past Surgical History: Past Surgical History:  Procedure Laterality Date   CHOLECYSTECTOMY  2006   CORONARY ANGIOPLASTY     DILITATION & CURRETTAGE/HYSTROSCOPY WITH HYDROTHERMAL ABLATION N/A 07/10/2013   Procedure: DILATATION & CURETTAGE/HYSTEROSCOPY WITH HYDROTHERMAL ABLATION;  Surgeon: Shelly Bombard, MD;  Location: [facility];  Service: Gynecology;  Laterality: N/A;   LEFT HEART CATH AND CORONARY ANGIOGRAPHY N/A 11/02/2019   Procedure: LEFT HEART CATH AND CORONARY ANGIOGRAPHY;  Surgeon: Sherren Mocha, MD;  Location: [facility];  Service: Cardiovascular;  Laterality: N/A;   TUBAL LIGATION  1997   WISDOM TOOTH EXTRACTION  1998    Allergies: Allergies  Allergen Reactions   Amlodipine Hives   Hydrocodone-Acetaminophen Hives and Itching    And itching Itching and hives   Losartan Potassium-Hctz Other (See Comments)    Dizziness   Tetanus Antitoxin     Arm swelling size of a softball.    Tetanus Toxoids     Arm swelling size of a softball.     Outpatient Meds: Current Outpatient Medications  Medication Sig Dispense Refill   aspirin 81 MG EC tablet Take 1 tablet (81 mg total) by mouth daily. 90 tablet 3   atorvastatin (LIPITOR) 40 MG tablet TAKE 1 TABLET (40 MG TOTAL) BY  MOUTH DAILY. 90 tablet 3   carvedilol (COREG) 25 MG tablet Take 1 tablet (25 mg total) by mouth 2 (two) times daily with a meal. 60 tablet 4   empagliflozin (JARDIANCE) 10 MG TABS tablet Take 1 tablet (10 mg total) by mouth daily. 30 tablet 3   fexofenadine (ALLEGRA) 180 MG tablet Take 180 mg by mouth daily.     fluticasone (FLONASE) 50 MCG/ACT nasal spray Place 2 sprays into both nostrils daily. 16 g 6   nortriptyline (PAMELOR) 25 MG capsule Take 1 capsule (25 mg total) by mouth at bedtime. To prevent pain the next day (Patient taking differently: Take 25 mg by mouth at bedtime. To prevent pain the next day takes as needed) 30 capsule 2   valsartan-hydrochlorothiazide (DIOVAN-HCT) 80-12.5 MG tablet Take 1 tablet by mouth daily. 90 tablet 3   Blood Glucose Monitoring Suppl (TRUE METRIX METER) w/Device KIT Use to check blood sugars twice per day 1 kit 0   gabapentin (NEURONTIN) 300 MG capsule Take 1 capsule (300 mg total) by mouth at bedtime. (Patient not taking: Reported on 07/03/2021) 30 capsule 2   glucose blood (TRUE METRIX BLOOD GLUCOSE TEST) test strip Use as instructed to check BS twice per day 100 each 12   ibuprofen (ADVIL) 600 MG tablet Take 1 tablet (600 mg total) by mouth every 8 (eight) hours as needed. 90 tablet 1   Multiple Vitamin (MULTIVITAMIN WITH MINERALS) TABS tablet Take 1 tablet by mouth daily. (Patient not taking: Reported  on 07/03/2021)     nitroGLYCERIN (NITROSTAT) 0.4 MG SL tablet Place 1 tablet (0.4 mg total) under the tongue every 5 (five) minutes x 3 doses as needed for chest pain. 25 tablet 11   TRUEplus Lancets 28G MISC Use when checking BS twice per day 100 each 11   No current facility-administered medications for this visit.      ___________________________________________________________________ Objective   Exam:  BP (!) 153/106    Pulse 79    Temp 97.8 F (36.6 C) (Temporal)    Resp 14    Ht _0  (1.575 m)    Wt 221 lb (100.2 kg)    SpO2 100%    BMI 40.42  kg/m   CV: RRR without murmur, S1/S2 Resp: clear to auscultation bilaterally, normal RR and effort noted GI: soft, no tenderness, with active bowel sounds.   Assessment: Encounter Diagnosis  Name Primary?   Special screening for malignant neoplasms, colon Yes     Plan: Colonoscopy  The benefits and risks of the planned procedure were described in detail with the patient or (when appropriate) their health care proxy.  Risks were outlined as including, but not limited to, bleeding, infection, perforation, adverse medication reaction leading to cardiac or pulmonary decompensation, pancreatitis (if ERCP).  The limitation of incomplete mucosal visualization was also discussed.  No guarantees or warranties were given.    The patient is appropriate for an endoscopic procedure in the ambulatory setting.   - Wilfrid Lund, MD

## 2021-07-03 NOTE — Progress Notes (Signed)
To Pacu, VSS. Report to [email or web address] 

## 2021-07-03 NOTE — Patient Instructions (Signed)
Handout given for diverticulosis.  YOU HAD AN ENDOSCOPIC PROCEDURE TODAY AT THE [facility]:   Refer to the procedure report that was given to you for any specific questions about what was found during the examination.  If the procedure report does not answer your questions, please call your gastroenterologist to clarify.  If you requested that your care partner not be given the details of your procedure findings, then the procedure report has been included in a sealed envelope for you to review at your convenience later.  YOU SHOULD EXPECT: Some feelings of bloating in the abdomen. Passage of more gas than usual.  Walking can help get rid of the air that was put into your GI tract during the procedure and reduce the bloating. If you had a lower endoscopy (such as a colonoscopy or flexible sigmoidoscopy) you may notice spotting of blood in your stool or on the toilet paper. If you underwent a bowel prep for your procedure, you may not have a normal bowel movement for a few days.  Please Note:  You might notice some irritation and congestion in your nose or some drainage.  This is from the oxygen used during your procedure.  There is no need for concern and it should clear up in a day or so.  SYMPTOMS TO REPORT IMMEDIATELY:   Following lower endoscopy (colonoscopy or flexible sigmoidoscopy):  Excessive amounts of blood in the stool  Significant tenderness or worsening of abdominal pains  Swelling of the abdomen that is new, acute  Fever of 100F or higher  For urgent or emergent issues, a gastroenterologist can be reached at any hour by calling [phone]. Do not use MyChart messaging for urgent concerns.    DIET:  We do recommend a small meal at first, but then you may proceed to your regular diet.  Drink plenty of fluids but you should avoid alcoholic beverages for 24 hours.  ACTIVITY:  You should plan to take it easy for the rest of today and you should NOT DRIVE or use  heavy machinery until tomorrow (because of the sedation medicines used during the test).    FOLLOW UP: Our staff will call the number listed on your records 48-72 hours following your procedure to check on you and address any questions or concerns that you may have regarding the information given to you following your procedure. If we do not reach you, we will leave a message.  We will attempt to reach you two times.  During this call, we will ask if you have developed any symptoms of COVID 19. If you develop any symptoms (ie: fever, flu-like symptoms, shortness of breath, cough etc.) before then, please call [phone].  If you test positive for Covid 19 in the 2 weeks post procedure, please call and report this information to us.    If any biopsies were taken you will be contacted by phone or by letter within the next 1-3 weeks.  Please call us at [phone] if you have not heard about the biopsies in 3 weeks.    SIGNATURES/CONFIDENTIALITY: You and/or your care partner have signed paperwork which will be entered into your electronic medical record.  These signatures attest to the fact that that the information above on your After Visit Summary has been reviewed and is understood.  Full responsibility of the confidentiality of this discharge information lies with you and/or your care-partner. 

## 2021-07-03 NOTE — Op Note (Signed)
[facility] Patient Name: Amy [name] Procedure Date: 07/03/2021 11:09 AM MRN: [id number] Endoscopist: [place]. Loletha Carrow , MD Age: 46 Referring MD:  Date of Birth: Jan 20, 1976 Gender: Female Account #: [id number] Procedure:                Colonoscopy Indications:              Screening for colorectal malignant neoplasm, This                            is the patient's first colonoscopy Medicines:                Monitored Anesthesia Care Procedure:                Pre-Anesthesia Assessment:                           - Prior to the procedure, a History and Physical                            was performed, and patient medications and                            allergies were reviewed. The patient's tolerance of                            previous anesthesia was also reviewed. The risks                            and benefits of the procedure and the sedation                            options and risks were discussed with the patient.                            All questions were answered, and informed consent                            was obtained. Prior Anticoagulants: The patient has                            taken no previous anticoagulant or antiplatelet                            agents. ASA Grade Assessment: III - A patient with                            severe systemic disease. After reviewing the risks                            and benefits, the patient was deemed in                            satisfactory condition to undergo the procedure.  After obtaining informed consent, the colonoscope                            was passed under direct vision. Throughout the                            procedure, the patient's blood pressure, pulse, and                            oxygen saturations were monitored continuously. The                            Olympus [id number]) Colonoscope was                            introduced  through the anus and advanced to the the                            cecum, identified by appendiceal orifice and                            ileocecal valve. The colonoscopy was performed                            without difficulty. The patient tolerated the                            procedure well. The quality of the bowel                            preparation was excellent. The appendiceal orifice                            and rectum were photographed. Scope In: 11:20:31 AM Scope Out: 11:32:27 AM Scope Withdrawal Time: 0 hours 9 minutes 29 seconds  Total Procedure Duration: 0 hours 11 minutes 56 seconds  Findings:                 The perianal and digital rectal examinations were                            normal.                           Diverticula were found in the left colon.                           The exam was otherwise without abnormality on                            direct and retroflexion views. Complications:            No immediate complications. Estimated Blood Loss:     Estimated blood loss: none. Impression:               - Diverticulosis in the left colon.                           -  The examination was otherwise normal on direct                            and retroflexion views.                           - No specimens collected. Recommendation:           - Patient has a contact number available for                            emergencies. The signs and symptoms of potential                            delayed complications were discussed with the                            patient. Return to normal activities tomorrow.                            Written discharge instructions were provided to the                            patient.                           - Resume previous diet.                           - Continue present medications.                           - Repeat colonoscopy in 10 years for screening                            purposes. Kellis Mcadam L. Loletha Carrow,  MD 07/03/2021 11:35:18 AM This report has been signed electronically.

## 2021-07-04 ENCOUNTER — Other Ambulatory Visit: Payer: Self-pay

## 2021-07-05 ENCOUNTER — Telehealth: Payer: Self-pay

## 2021-07-05 ENCOUNTER — Ambulatory Visit: Payer: 59 | Admitting: Family Medicine

## 2021-07-05 ENCOUNTER — Ambulatory Visit: Payer: Self-pay | Admitting: Family Medicine

## 2021-07-05 NOTE — Telephone Encounter (Signed)
°  Follow up Call-  Call back number 07/03/2021  Post procedure Call Back phone  # [phone]  Permission to leave phone message Yes  Some recent data might be hidden     Patient questions:  Do you have a fever, pain , or abdominal swelling? No. Pain Score  0 *  Have you tolerated food without any problems? Yes.    Have you been able to return to your normal activities? Yes.    Do you have any questions about your discharge instructions: Diet   No. Medications  No. Follow up visit  No.  Do you have questions or concerns about your Care? No.  Actions: * If pain score is 4 or above: No action needed, pain <4.

## 2021-07-10 ENCOUNTER — Ambulatory Visit: Payer: Self-pay | Admitting: Family Medicine

## 2021-07-10 NOTE — Progress Notes (Signed)
° °  I, Christoper Fabian, LAT, ATC, am serving as scribe for Dr. Clementeen Graham.  Amy [name] is a 46 y.o. female who presents to [facility] at [facility] today for f/u of R wrist and distal forearm pain since June 2021 after having a cardiac catherization.  She was last seen by Dr. Denyse Amass on 05/31/21 and was encouraged to con't PT of which she has now completed 4 sessions.  She has been wearing her night wrist splint and was prescribed both IBU 600mg  and nortriptyline 25 mg.  She's had a R UE NCV/EMG on 05/11/21.  Today, pt reports that the nortriptyline and IBU seems to help w/ her pain.  She reports that kinesiotape helped w/ her pain but her pain immediately returned once she removed the tape.  She locates the majority of her pain to her R proximal, medial forearm.  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  Diagnostic testing: R UE NCV/EMG- 05/11/21  Pertinent review of systems: No fevers or chills  Relevant historical information: Hypertension.  Diabetes.   Exam:  BP 140/86 (BP Location: Right Arm, Patient Position: Sitting, Cuff Size: Normal)    Pulse 91    Ht 5\' 2"  (1.575 m)    Wt 213 lb 3.2 oz (96.7 kg)    SpO2 98%    BMI 38.99 kg/m  General: Well Developed, well nourished, and in no acute distress.   MSK: Right hand and forearm normal motion.       Assessment and Plan: 46 y.o. 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  This is a reasonable diagnosis and she does seem to be responding to initial trials of occupational therapy treatment for this.  Plan to continue OT check back in 2 months.  Next step if not improving would likely be a diagnostic and therapeutic injection of the median nerve at the pronator.  This should help confirm the diagnosis of pronator syndrome and may ultimately resolve her symptoms.  However this is invasive and neither [name] nor I are excited about doing this  injection right now.  Plan to really give occupational therapy a chance and check back in 2 months.    Discussed warning signs or symptoms. Please see discharge instructions. Patient expresses understanding.   The above documentation has been reviewed and is accurate and complete , M.D.

## 2021-07-10 NOTE — Progress Notes (Unsigned)
° ° °  Amy [name] is a 46 y.o. female who presents to [facility] at [facility] today for ***   Pertinent review of systems: ***  Relevant historical information: ***   Exam:  There were no vitals taken for this visit. General: Well Developed, well nourished, and in no acute distress.   MSK: ***    Lab and Radiology Results No results found for this or any previous visit (from the past 72 hour(s)). No results found.     Assessment and Plan: 46 y.o. female with ***   PDMP not reviewed this encounter. No orders of the defined types were placed in this encounter.  No orders of the defined types were placed in this encounter.    Discussed warning signs or symptoms. Please see discharge instructions. Patient expresses understanding.   ***

## 2021-07-11 ENCOUNTER — Ambulatory Visit (INDEPENDENT_AMBULATORY_CARE_PROVIDER_SITE_OTHER): Payer: 59 | Admitting: Family Medicine

## 2021-07-11 ENCOUNTER — Other Ambulatory Visit: Payer: Self-pay

## 2021-07-11 ENCOUNTER — Encounter: Payer: Self-pay | Admitting: Family Medicine

## 2021-07-11 VITALS — BP 140/86 | HR 91 | Ht 62.0 in | Wt 213.2 lb

## 2021-07-11 DIAGNOSIS — M25531 Pain in right wrist: Secondary | ICD-10-CM | POA: Diagnosis not present

## 2021-07-11 DIAGNOSIS — G5611 Other lesions of median nerve, right upper limb: Secondary | ICD-10-CM | POA: Diagnosis not present

## 2021-07-11 NOTE — Patient Instructions (Addendum)
Good to see you today.  Con't OT and your home exercises.  Follow-up: 2 months.  I can do an injection if needed.  If you know you are going to be freaked out about the injection I can prescribe medicine ahead of time.  You will need someone to drive you.    Call and schedule more OT.  [facility] [address] [place], [place], [address] Phone: [phone]   Pronator Syndrome The median nerve is a nerve in the forearm that enables feeling and muscle function on certain parts of the hand. Pronator syndrome is a condition that happens when the median nerve has pressure (compression) placed on it by a muscle or other structure on the inner side of the forearm near the elbow. The condition can cause weakness or tingling in the thumb, index, middle, and ring fingers. It can also cause a dull ache or pain in the forearm. What are the causes? This condition may be caused by: Overuse or repetitive movements that increase the size of a muscle in your forearm (pronator teres). Trauma or a hard, direct hit to the forearm, resulting in swelling (hematoma). A problem that is present at birth (congenital defect). What increases the risk? This condition is more likely to develop in: Weightlifters. People who play sports that require forearm rotation, such as baseball, tennis, or golf. People who have a job that requires grasping objects and twisting the forearm, such as carpentry. Adults who are 52-63 years old. Females. What are the signs or symptoms? Symptoms of this condition include: An ache or pain in the palm side of your forearm, close to your elbow. A tingling or prickly feeling (sensation) in your thumb, index, middle, and half of your ring fingers. Weakness in your hand, especially when pinching your index finger and thumb together. Symptoms that get worse with repetitive movement, gripping, or forearm rotation. How is this  diagnosed? This condition may be diagnosed based on: Your medical history. A physical exam. Your health care provider may ask you to move your hand, fingers, wrist, and arm in certain ways. Doing this will help your health care provider find the source of your pain. Tests and imaging studies, including: An electromyogram. This test can show how well the median nerve is working and show if there is too much pressure on it or a nearby nerve. A nerve conduction study. This test measures how well electrical signals pass through your nerves. X-ray. This test may be done to check for an underlying bone problem. How is this treated? This condition may be treated with: Rest. Avoid or limit activities that cause your symptoms to get worse or flare up. NSAIDs, such as ibuprofen, or steroid medicine. These medicines may be prescribed to help with pain and swelling. A splint or brace. You may need to wear a splint or brace for support until your symptoms improve. Exercises to help you maintain movement (physical or occupational therapy). This involves doing hand and arm exercises. If treatments do not help, you may need: An injection of an anti-inflammatory medicine (steroid) mixed with a numbing medicine (local anesthetic). Surgery. This is usually done only if other treatments fail and symptoms continue for more than 6 months. Follow these instructions at home: If you have a splint or brace: Wear it as told by your health care provider. Remove it only as told by your health care provider. Loosen it if your fingers tingle, become numb, or turn cold and  blue. Keep it clean. If the splint or brace is not waterproof: Do not let it get wet. Cover it with a watertight covering when you take a bath or shower. Managing pain, stiffness, and swelling  Take over-the-counter and prescription medicines only as told by your health care provider. Move your fingers often to reduce stiffness and swelling. Raise  (elevate) the injured area above the level of your heart while you are sitting or lying down. If directed, put ice on the injured area. If you have a removable splint or brace, remove it as told by your health care provider. Put ice in a plastic bag. Place a towel between your skin and the bag. Leave the ice on for 20 minutes, 2-3 times a day. Activity Do exercises as told by your health care provider. Return to your normal activities as told by your health care provider. Ask your health care provider what activities are safe for you. Do not lift anything that is heavier than 10 lb (4.5 kg), or the limit that you are told, until your health care provider says that it is safe. General instructions Do not use any products that contain nicotine or tobacco, such as cigarettes, e-cigarettes, and chewing tobacco. These can delay healing. If you need help quitting, ask your health care provider. Keep all follow-up visits as told by your health care provider. This is important. How is this prevented? Warm up and stretch before being active. Cool down and stretch after being active. Give your body time to rest between periods of activity. Maintain physical fitness, including strength and flexibility. Contact a health care provider if: Your symptoms do not improve in 4-6 weeks. Your symptoms get worse. Your splint or brace is causing pain, numbness, or tingling that is new. Get help right away if: Your pain is severe. You cannot move part of your hand or arm. Summary Pronator syndrome involves pressure (compression) of the median nerve on the inner side of your forearm near the elbow. This condition may be treated by wearing a splint or brace, taking medicines, doing exercises, or having surgery. Follow instructions about restricting activity, using a splint or brace, taking medicines, or resting. Contact a health care provider if your symptoms do not get better after 4-6 weeks. Get help right  away if your pain is severe, or if you cannot move your hand or arm. This information is not intended to replace advice given to you by your health care provider. Make sure you discuss any questions you have with your health care provider. Document Revised: 07/23/2018 Document Reviewed: 07/24/2018 Elsevier Patient Education  [employer].

## 2021-07-13 ENCOUNTER — Ambulatory Visit: Payer: 59 | Attending: Physician Assistant | Admitting: Physician Assistant

## 2021-07-13 ENCOUNTER — Other Ambulatory Visit (HOSPITAL_COMMUNITY)
Admission: RE | Admit: 2021-07-13 | Discharge: 2021-07-13 | Disposition: A | Discharge status: Home / Self Care | Payer: 59 | Source: Ambulatory Visit | Attending: Physician Assistant | Admitting: Physician Assistant

## 2021-07-13 ENCOUNTER — Encounter: Payer: Self-pay | Admitting: Physician Assistant

## 2021-07-13 VITALS — BP 159/93 | HR 77 | Ht 62.0 in | Wt 212.2 lb

## 2021-07-13 DIAGNOSIS — N760 Acute vaginitis: Secondary | ICD-10-CM | POA: Diagnosis not present

## 2021-07-13 DIAGNOSIS — E119 Type 2 diabetes mellitus without complications: Secondary | ICD-10-CM | POA: Diagnosis not present

## 2021-07-13 DIAGNOSIS — E118 Type 2 diabetes mellitus with unspecified complications: Secondary | ICD-10-CM | POA: Insufficient documentation

## 2021-07-13 DIAGNOSIS — R87610 Atypical squamous cells of undetermined significance on cytologic smear of cervix (ASC-US): Secondary | ICD-10-CM | POA: Insufficient documentation

## 2021-07-13 DIAGNOSIS — Z124 Encounter for screening for malignant neoplasm of cervix: Secondary | ICD-10-CM | POA: Diagnosis not present

## 2021-07-13 LAB — GLUCOSE, POCT (MANUAL RESULT ENTRY): POC Glucose: 103 mg/dl — AB (ref 70–99)

## 2021-07-13 NOTE — Progress Notes (Signed)
Patient ID: Amy [name], female   DOB: 02/14/1976, 46 y.o.   MRN: [id number]   Amy [name], is a 46 y.o. female  EUM:[id number]  [id number]  DOB - 10-26-1975  Chief Complaint  Patient presents with   Gynecologic Exam       Subjective:   Amy [name] is a 46 y.o. female here today for pap only.  No issues or concerns.  No pelvic pain.  \s/p tubal ligation 1997 and uterine ablation 2015  No problems updated.  ALLERGIES: Allergies  Allergen Reactions   Amlodipine Hives   Hydrocodone-Acetaminophen Hives and Itching    And itching Itching and hives   Losartan Potassium-Hctz Other (See Comments)    Dizziness   Tetanus Antitoxin     Arm swelling size of a softball.    Tetanus Toxoids     Arm swelling size of a softball.     PAST MEDICAL HISTORY: Past Medical History:  Diagnosis Date   Anemia    Coronary artery disease    Diabetes mellitus without complication (HCC)    Excessive or frequent menstruation 09/10/2012   Hyperlipidemia    Hypertension    NSTEMI (non-ST elevated myocardial infarction) ([place]) 10/30/2019   PONV (postoperative nausea and vomiting)    with ablation only    MEDICATIONS AT HOME: Prior to Admission medications   Medication Sig Start Date End Date Taking? Authorizing Provider  aspirin 81 MG EC tablet Take 1 tablet (81 mg total) by mouth daily. 01/16/21  Yes Elsie Stain, MD  atorvastatin (LIPITOR) 40 MG tablet TAKE 1 TABLET (40 MG TOTAL) BY MOUTH DAILY. 05/01/21 05/01/22 Yes Elsie Stain, MD  Blood Glucose Monitoring Suppl (TRUE METRIX METER) w/Device KIT Use to check blood sugars twice per day 08/20/19  Yes Fulp, Cammie, MD  carvedilol (COREG) 25 MG tablet Take 1 tablet (25 mg total) by mouth 2 (two) times daily with a meal. 05/01/21 05/01/22 Yes Elsie Stain, MD  empagliflozin (JARDIANCE) 10 MG TABS tablet Take 1 tablet (10 mg total) by mouth daily. 05/01/21  Yes Elsie Stain, MD  fexofenadine (ALLEGRA) 180  MG tablet Take 180 mg by mouth daily.   Yes [provider]  fluticasone (FLONASE) 50 MCG/ACT nasal spray Place 2 sprays into both nostrils daily. 06/14/21  Yes Elsie Stain, MD  gabapentin (NEURONTIN) 300 MG capsule Take 1 capsule (300 mg total) by mouth at bedtime. 05/02/21  Yes Gregor Hams, MD  glucose blood (TRUE METRIX BLOOD GLUCOSE TEST) test strip Use as instructed to check BS twice per day 08/20/19  Yes Fulp, Cammie, MD  ibuprofen (ADVIL) 600 MG tablet Take 1 tablet (600 mg total) by mouth every 8 (eight) hours as needed. 05/31/21  Yes Gregor Hams, MD  Multiple Vitamin (MULTIVITAMIN WITH MINERALS) TABS tablet Take 1 tablet by mouth daily.   Yes [provider]  nitroGLYCERIN (NITROSTAT) 0.4 MG SL tablet Place 1 tablet (0.4 mg total) under the tongue every 5 (five) minutes x 3 doses as needed for chest pain. 06/19/21  Yes Elsie Stain, MD  nortriptyline (PAMELOR) 25 MG capsule Take 1 capsule (25 mg total) by mouth at bedtime. To prevent pain the next day Patient taking differently: Take 25 mg by mouth at bedtime. To prevent pain the next day takes as needed 05/31/21  Yes Gregor Hams, MD  TRUEplus Lancets 28G MISC Use when checking BS twice per day 08/20/19  Yes Fulp, Cammie, MD  valsartan-hydrochlorothiazide (DIOVAN-HCT) 80-12.5 MG tablet  Take 1 tablet by mouth daily. 05/01/21  Yes Elsie Stain, MD    ROS: Neg HEENT Neg resp Neg cardiac Neg GI Neg GU Neg MS Neg psych Neg neuro  Objective:   Vitals:   07/13/21 1026  BP: (!) 159/93  Pulse: 77  SpO2: 99%  Weight: 212 lb 4 oz (96.3 kg)  Height: _0  (1.575 m)   Exam General appearance : Awake, alert, not in any distress. Speech Clear. Not toxic looking HEENT: Atraumatic and Normocephalic Neck: Supple, no JVD. No cervical lymphadenopathy.  Chest: Good air entry bilaterally, CTAB.  No rales/rhonchi/wheezing CVS: S1 S2 regular, no murmurs.  GU-external WNL.  Speculum inserted and vaginal mucosa  normal.  Cervix has a small cluster of ? Nabothian cysts.  Pap and swab taken Bimanual unremarkable.   Extremities: B/L Lower Ext shows no edema, both legs are warm to touch Neurology: Awake alert, and oriented X 3, CN II-XII intact, Non focal Skin: No Rash  Data Review Lab Results  Component Value Date   HGBA1C 6.6 (H) 04/12/2021   HGBA1C 7.4 (A) 10/19/2020   HGBA1C 6.3 (A) 04/07/2020    Assessment & Plan   1. Type 2 diabetes mellitus without complication, without long-term current use of insulin (HCC) Continue current regimen.  Blood sugar is good this morning.  Last A1C almost 3 months ago and was 6.6.  sees PCP next month - Glucose (CBG)  2. Screening for cervical cancer - Cytology - PAP - Cervicovaginal ancillary only    Return for keep upcoming appt with Dr Joya Gaskins.  The patient was given clear instructions to go to ER or return to medical center if symptoms don't improve, worsen or new problems develop. The patient verbalized understanding. The patient was told to call to get lab results if they haven't heard anything in the next week.      Freeman Caldron, PA-C [facility] [place], [place]   07/13/2021, 10:43 AM

## 2021-07-14 LAB — CERVICOVAGINAL ANCILLARY ONLY
Bacterial Vaginitis (gardnerella): POSITIVE — AB
Candida Glabrata: NEGATIVE
Candida Vaginitis: NEGATIVE
Chlamydia: NEGATIVE
Comment: NEGATIVE
Comment: NEGATIVE
Comment: NEGATIVE
Comment: NEGATIVE
Comment: NEGATIVE
Comment: NORMAL
Neisseria Gonorrhea: NEGATIVE
Trichomonas: NEGATIVE

## 2021-07-17 ENCOUNTER — Other Ambulatory Visit: Payer: Self-pay

## 2021-07-17 ENCOUNTER — Other Ambulatory Visit: Payer: Self-pay | Admitting: Physician Assistant

## 2021-07-17 MED ORDER — METRONIDAZOLE 500 MG PO TABS
500.0000 mg | ORAL_TABLET | Freq: Two times a day (BID) | ORAL | 0 refills | Status: DC
  Filled 2021-07-17: qty 14, 7d supply, fill #0

## 2021-07-18 ENCOUNTER — Telehealth: Payer: Self-pay | Admitting: *Deleted

## 2021-07-18 LAB — CYTOLOGY - PAP
Adequacy: ABSENT
Comment: NEGATIVE
Diagnosis: UNDETERMINED — AB
High risk HPV: NEGATIVE

## 2021-07-18 NOTE — Telephone Encounter (Signed)
Lab results discussed, questions answered, verbalized uderstanding.

## 2021-07-19 ENCOUNTER — Other Ambulatory Visit: Payer: Self-pay

## 2021-07-19 ENCOUNTER — Ambulatory Visit: Payer: 59 | Attending: Family Medicine | Admitting: Occupational Therapy

## 2021-07-19 ENCOUNTER — Encounter: Payer: Self-pay | Admitting: Occupational Therapy

## 2021-07-19 ENCOUNTER — Other Ambulatory Visit: Payer: Self-pay | Admitting: Physician Assistant

## 2021-07-19 DIAGNOSIS — R208 Other disturbances of skin sensation: Secondary | ICD-10-CM | POA: Diagnosis not present

## 2021-07-19 DIAGNOSIS — M25531 Pain in right wrist: Secondary | ICD-10-CM | POA: Diagnosis present

## 2021-07-19 DIAGNOSIS — R87610 Atypical squamous cells of undetermined significance on cytologic smear of cervix (ASC-US): Secondary | ICD-10-CM

## 2021-07-19 NOTE — Therapy (Signed)
[facility] [address] [place], [place], [address] Phone: [phone]   Fax:  [phone]  Occupational Therapy Treatment & Recertification  Patient Details  Name: Amy [name] MRN: [id number] Date of Birth: August 17, 1975 Referring Provider (OT): Lynne Leader, MD   Encounter Date: 07/19/2021   OT End of Session - 07/19/21 0924     Visit Number 6    Number of Visits 18    Date for OT Re-Evaluation 09/13/21    Authorization Type Aet[employer]    Authorization Time Period VL: 60 visits    OT Start Time 0912    OT Stop Time 0950    OT Time Calculation (min) 38 min    Activity Tolerance Patient tolerated treatment well;Patient limited by pain    Behavior During Therapy [facility] for tasks assessed/performed             Past Medical History:  Diagnosis Date   Anemia    Coronary artery disease    Diabetes mellitus without complication ([place])    Excessive or frequent menstruation 09/10/2012   Hyperlipidemia    Hypertension    NSTEMI (non-ST elevated myocardial infarction) ([place]) 10/30/2019   PONV (postoperative nausea and vomiting)    with ablation only    Past Surgical History:  Procedure Laterality Date   CHOLECYSTECTOMY  2006   CORONARY ANGIOPLASTY     DILITATION & CURRETTAGE/HYSTROSCOPY WITH HYDROTHERMAL ABLATION N/A 07/10/2013   Procedure: DILATATION & CURETTAGE/HYSTEROSCOPY WITH HYDROTHERMAL ABLATION;  Surgeon: Shelly Bombard, MD;  Location: [facility];  Service: Gynecology;  Laterality: N/A;   LEFT HEART CATH AND CORONARY ANGIOGRAPHY N/A 11/02/2019   Procedure: LEFT HEART CATH AND CORONARY ANGIOGRAPHY;  Surgeon: Sherren Mocha, MD;  Location: [facility];  Service: Cardiovascular;  Laterality: N/A;   TUBAL LIGATION  1997   WISDOM TOOTH EXTRACTION  1998    There were no vitals filed for this visit.   Subjective Assessment - 07/19/21 0923     Subjective  "The tape worked really wel but when I took it off  the pain shot down to my hand"    Pertinent History HTN, DM type 2    Patient Stated Goals "get my wrist back and figure out what's going on"    Currently in Pain? Yes    Pain Score 8     Pain Location Wrist    Pain Orientation Right    Pain Descriptors / Indicators Aching;Shooting;Sore;Tingling    Pain Type Chronic pain;Neuropathic pain    Pain Onset More than a month ago    Pain Frequency Constant              Continuous [place] over RUE proximal volar forearm 3 Mhz, 0.8 wts/cm2 x 8 minutes.    Kinesiotaping to pronator teres on RUE  reviewed Median Nerve Glides for wrist and hand.  Got scheduled for 2x/week for 6 more weeks for re-certification. Discussed benefits and procedure of iontophoresis and will continue to do research to assess benefits.                    OT Short Term Goals - 05/16/21 1601       OT SHORT TERM GOAL #1   Title LTGs only               OT Long Term Goals - 07/19/21 0920       OT LONG TERM GOAL #1   Title Pt will be independent with HEP  Time 8    Period Weeks    Status Achieved    Target Date 07/14/21      OT LONG TERM GOAL #2   Title Pt will verbalize understanding of pain management strategies    Time 8    Period Weeks    Status Achieved      OT LONG TERM GOAL #3   Title Pt will report pain no greater than 6/10 with completion of HEP    Time 8    Period Weeks    Status On-going      OT LONG TERM GOAL #4   Title Pt will increase 3 point pinch to 7 lbs or greater and lateral pinch to 14 lbs or greater in RUE    Baseline 3 point: L 7, R 6 key: L 14, R 12    Time 8    Period Weeks    Status Achieved   key R 20, 3 point R 22     OT LONG TERM GOAL #5   Title Pt will be independent with updated HEPs    Time 8    Period Weeks    Status New    Target Date 09/13/21      Long Term Additional Goals   Additional Long Term Goals Yes      OT LONG TERM GOAL #6   Title Pt will verbalize understanding of adapted  strategies and/or equipment PRN for increasing independence and decreasing pain with ADLs and IADLs.    Time 8    Period Weeks    Status New                   Plan - 07/19/21 1042     Clinical Impression Statement Recertification for more visits to continue to address RUE pain and discomfort and pronator syndrome. Pt would benefitt from continued modalities and exercises for RUE functional use and decreased pain. Pt has met 3/4 LTGs and is progressing towards new and ongoing goals.    OT Occupational Profile and History Problem Focused Assessment - Including review of records relating to presenting problem    Occupational performance deficits (Please refer to evaluation for details): ADL's;IADL's    Body Structure / Function / Physical Skills ADL;Pain;Strength;IADL;UE functional use;Decreased knowledge of use of DME;Sensation    Rehab Potential Good    Clinical Decision Making Limited treatment options, no task modification necessary    Comorbidities Affecting Occupational Performance: None    Modification or Assistance to Complete Evaluation  No modification of tasks or assist necessary to complete eval    OT Frequency 2x / week    OT Duration 8 weeks   12 visits over 8 weeks d/t any scheduling conflicts   OT Treatment/Interventions Ultrasound;Cryotherapy;Electrical Stimulation;Paraffin;Energy conservation;Manual Therapy;Patient/family education;Iontophoresis;Traction;Fluidtherapy;Self-care/ADL training;Moist Heat;Therapeutic exercise;DME and/or AE instruction;Splinting;Therapeutic activities;Passive range of motion    Plan iontophoresis #1 if cert signed and if beneficial for syndrome, review glides and add PRN    Consulted and Agree with Plan of Care Patient             Patient will benefit from skilled therapeutic intervention in order to improve the following deficits and impairments:   Body Structure / Function / Physical Skills: ADL, Pain, Strength, IADL, UE functional  use, Decreased knowledge of use of DME, Sensation       Visit Diagnosis: Other disturbances of skin sensation  Pain in right wrist    Problem List Patient Active Problem List   Diagnosis Date  Noted   Pronator syndrome, right 07/11/2021   Seasonal allergic rhinitis due to pollen 06/14/2021   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   Essential hypertension 01/04/2020   Type 2 diabetes mellitus without complication, without long-term current use of insulin ([place]) 01/04/2020   Class 3 severe obesity due to excess calories with serious comorbidity and body mass index (BMI) of 40.0 to 44.9 in adult [facility]) 01/04/2020   Coronary artery disease due to type 2 diabetes mellitus ([place]) 01/04/2020   Dental caries 01/04/2020    Zachery Conch, OT 07/19/2021, 10:45 AM  [facility] [address] [place], [place], [address] Phone: [phone]   Fax:  [phone]  Name: Jenina Moening MRN: [id number] Date of Birth: [birth year]

## 2021-07-24 ENCOUNTER — Ambulatory Visit: Payer: 59 | Admitting: Occupational Therapy

## 2021-07-24 ENCOUNTER — Telehealth: Payer: Self-pay | Admitting: Critical Care Medicine

## 2021-07-24 ENCOUNTER — Other Ambulatory Visit: Payer: Self-pay

## 2021-07-24 ENCOUNTER — Encounter: Payer: Self-pay | Admitting: Occupational Therapy

## 2021-07-24 DIAGNOSIS — R208 Other disturbances of skin sensation: Secondary | ICD-10-CM | POA: Diagnosis not present

## 2021-07-24 DIAGNOSIS — M25531 Pain in right wrist: Secondary | ICD-10-CM

## 2021-07-24 NOTE — Therapy (Signed)
[facility] [address] [place], [place], [address] Phone: [phone]   Fax:  [phone]  Occupational Therapy Treatment  Patient Details  Name: Amy [name] MRN: [id number] Date of Birth: Mar 20, 1976 Referring Provider (OT): Clementeen Graham, MD   Encounter Date: 07/24/2021   OT End of Session - 07/24/21 1013     Visit Number 7    Number of Visits 18    Date for OT Re-Evaluation 09/13/21    Authorization Type Aetna    Authorization Time Period VL: 60 visits    OT Start Time 1012    OT Stop Time 1052    OT Time Calculation (min) 40 min    Activity Tolerance Patient tolerated treatment well;Patient limited by pain    Behavior During Therapy [facility] for tasks assessed/performed             Past Medical History:  Diagnosis Date   Anemia    Coronary artery disease    Diabetes mellitus without complication (HCC)    Excessive or frequent menstruation 09/10/2012   Hyperlipidemia    Hypertension    NSTEMI (non-ST elevated myocardial infarction) (HCC) 10/30/2019   PONV (postoperative nausea and vomiting)    with ablation only    Past Surgical History:  Procedure Laterality Date   CHOLECYSTECTOMY  2006   CORONARY ANGIOPLASTY     DILITATION & CURRETTAGE/HYSTROSCOPY WITH HYDROTHERMAL ABLATION N/A 07/10/2013   Procedure: DILATATION & CURETTAGE/HYSTEROSCOPY WITH HYDROTHERMAL ABLATION;  Surgeon: Brock Bad, MD;  Location: [facility];  Service: Gynecology;  Laterality: N/A;   LEFT HEART CATH AND CORONARY ANGIOGRAPHY N/A 11/02/2019   Procedure: LEFT HEART CATH AND CORONARY ANGIOGRAPHY;  Surgeon: Tonny Bollman, MD;  Location: [facility];  Service: Cardiovascular;  Laterality: N/A;   TUBAL LIGATION  1997   WISDOM TOOTH EXTRACTION  1998    There were no vitals filed for this visit.   Subjective Assessment - 07/24/21 1012     Subjective  "just a little - not very much"    Pertinent History HTN, DM type 2     Patient Stated Goals "get my wrist back and figure out what's going on"    Currently in Pain? Yes    Pain Score 5     Pain Location Wrist    Pain Orientation Right    Pain Descriptors / Indicators Aching;Shooting;Sore;Tingling    Pain Type Chronic pain;Neuropathic pain    Pain Onset More than a month ago    Pain Frequency Constant                          OT Treatments/Exercises (OP) - 07/24/21 1028       Wrist Exercises   Other wrist exercises tendon gliding RUE    Other wrist exercises Forearm Gym - 5 revolutions each side      Modalities   Modalities Iontophoresis      Iontophoresis   Type of Iontophoresis Dexamethasone    Location RUE pronator teres    Dose 2.0 cc    Time 20 minutes, 8 minute set up                    OT Education - 07/24/21 1046     Education Details tendon glides - HEP    Person(s) Educated Patient    Methods Explanation;Handout;Demonstration    Comprehension Verbalized understanding;Returned demonstration  OT Short Term Goals - 05/16/21 1601       OT SHORT TERM GOAL #1   Title LTGs only               OT Long Term Goals - 07/19/21 0920       OT LONG TERM GOAL #1   Title Pt will be independent with HEP    Time 8    Period Weeks    Status Achieved    Target Date 07/14/21      OT LONG TERM GOAL #2   Title Pt will verbalize understanding of pain management strategies    Time 8    Period Weeks    Status Achieved      OT LONG TERM GOAL #3   Title Pt will report pain no greater than 6/10 with completion of HEP    Time 8    Period Weeks    Status On-going      OT LONG TERM GOAL #4   Title Pt will increase 3 point pinch to 7 lbs or greater and lateral pinch to 14 lbs or greater in RUE    Baseline 3 point: L 7, R 6 key: L 14, R 12    Time 8    Period Weeks    Status Achieved   key R 20, 3 point R 22     OT LONG TERM GOAL #5   Title Pt will be independent with updated HEPs     Time 8    Period Weeks    Status New    Target Date 09/13/21      Long Term Additional Goals   Additional Long Term Goals Yes      OT LONG TERM GOAL #6   Title Pt will verbalize understanding of adapted strategies and/or equipment PRN for increasing independence and decreasing pain with ADLs and IADLs.    Time 8    Period Weeks    Status New                   Plan - 07/24/21 1029     Clinical Impression Statement Certification signed from MD for I[place] # 1 performed today - no adverse reactions.    OT Occupational Profile and History Problem Focused Assessment - Including review of records relating to presenting problem    Occupational performance deficits (Please refer to evaluation for details): ADL's;IADL's    Body Structure / Function / Physical Skills ADL;Pain;Strength;IADL;UE functional use;Decreased knowledge of use of DME;Sensation    Rehab Potential Good    Clinical Decision Making Limited treatment options, no task modification necessary    Comorbidities Affecting Occupational Performance: None    Modification or Assistance to Complete Evaluation  No modification of tasks or assist necessary to complete eval    OT Frequency 2x / week    OT Duration 8 weeks   12 visits over 8 weeks d/t any scheduling conflicts   OT Treatment/Interventions Ultrasound;Cryotherapy;Electrical Stimulation;Paraffin;Energy conservation;Manual Therapy;Patient/family education;Iontophoresis;Traction;Fluidtherapy;Self-care/ADL training;Moist Heat;Therapeutic exercise;DME and/or AE instruction;Splinting;Therapeutic activities;Passive range of motion    Plan iontophoresis #2 if cert signed and if beneficial for syndrome, review glides and add PRN    Consulted and Agree with Plan of Care Patient             Patient will benefit from skilled therapeutic intervention in order to improve the following deficits and impairments:   Body Structure / Function / Physical Skills: ADL, Pain, Strength,  IADL, UE functional use,  Decreased knowledge of use of DME, Sensation       Visit Diagnosis: Other disturbances of skin sensation  Pain in right wrist    Problem List Patient Active Problem List   Diagnosis Date Noted   Pronator syndrome, right 07/11/2021   Seasonal allergic rhinitis due to pollen 06/14/2021   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   Essential hypertension 01/04/2020   Type 2 diabetes mellitus without complication, without long-term current use of insulin (HCC) 01/04/2020   Class 3 severe obesity due to excess calories with serious comorbidity and body mass index (BMI) of 40.0 to 44.9 in adult [facility]) 01/04/2020   Coronary artery disease due to type 2 diabetes mellitus (HCC) 01/04/2020   Dental caries 01/04/2020    Junious Dresser, OT 07/24/2021, 10:55 AM  [facility] [address] [place], [place], [address] Phone: [phone]   Fax:  [phone]  Name: Amy [name] MRN: [id number] Date of Birth: 02-22-1976

## 2021-07-24 NOTE — Telephone Encounter (Unsigned)
Copied from CRM [id number]. Topic: General - Other >> Jul 24, 2021 12:11 PM Traci Sermon wrote: Reason for CRM: Pt called in about the referral she had, but they are pushed out until May, and pt was requesting to see if she can get another to office that is seeing pt sooner, please advise.

## 2021-07-26 ENCOUNTER — Other Ambulatory Visit: Payer: Self-pay

## 2021-07-26 ENCOUNTER — Ambulatory Visit: Payer: 59 | Attending: Critical Care Medicine | Admitting: Occupational Therapy

## 2021-07-26 ENCOUNTER — Encounter: Payer: Self-pay | Admitting: Occupational Therapy

## 2021-07-26 DIAGNOSIS — G5601 Carpal tunnel syndrome, right upper limb: Secondary | ICD-10-CM | POA: Insufficient documentation

## 2021-07-26 DIAGNOSIS — M6281 Muscle weakness (generalized): Secondary | ICD-10-CM | POA: Diagnosis present

## 2021-07-26 DIAGNOSIS — M25531 Pain in right wrist: Secondary | ICD-10-CM | POA: Insufficient documentation

## 2021-07-26 DIAGNOSIS — G5681 Other specified mononeuropathies of right upper limb: Secondary | ICD-10-CM | POA: Diagnosis not present

## 2021-07-26 DIAGNOSIS — R208 Other disturbances of skin sensation: Secondary | ICD-10-CM | POA: Insufficient documentation

## 2021-07-26 NOTE — Therapy (Signed)
[place] [facility] ?[place], [place], [address] ?Phone: [phone]   Fax:  [phone] ? ?Occupational Therapy Treatment ? ?Patient Details  ?Name: Amy [name] ?MRN: [id number] ?Date of Birth: 1975-11-09 ?Referring Provider (OT): Lynne Leader, MD ? ? ?Encounter Date: 07/26/2021 ? ? OT End of Session - 07/26/21 1025   ? ? Visit Number 8   ? Number of Visits 18   ? Date for OT Re-Evaluation 09/13/21   ? Authorization Type Aetna   ? Authorization Time Period VL: 60 visits   ? OT Start Time 1014   ? OT Stop Time 1052   ? OT Time Calculation (min) 38 min   ? Activity Tolerance Patient tolerated treatment well;Patient limited by pain   ? Behavior During Therapy [facility] for tasks assessed/performed   ? ?  ?  ? ?  ? ? ?Past Medical History:  ?Diagnosis Date  ? Anemia   ? Coronary artery disease   ? Diabetes mellitus without complication ([place])   ? Excessive or frequent menstruation 09/10/2012  ? Hyperlipidemia   ? Hypertension   ? NSTEMI (non-ST elevated myocardial infarction) ([place]) 10/30/2019  ? PONV (postoperative nausea and vomiting)   ? with ablation only  ? ? ?Past Surgical History:  ?Procedure Laterality Date  ? CHOLECYSTECTOMY  2006  ? CORONARY ANGIOPLASTY    ? DILITATION & CURRETTAGE/HYSTROSCOPY WITH HYDROTHERMAL ABLATION N/A 07/10/2013  ? Procedure: DILATATION & CURETTAGE/HYSTEROSCOPY WITH HYDROTHERMAL ABLATION;  Surgeon: Shelly Bombard, MD;  Location: [facility];  Service: Gynecology;  Laterality: N/A;  ? LEFT HEART CATH AND CORONARY ANGIOGRAPHY N/A 11/02/2019  ? Procedure: LEFT HEART CATH AND CORONARY ANGIOGRAPHY;  Surgeon: Sherren Mocha, MD;  Location: [facility];  Service: Cardiovascular;  Laterality: N/A;  ? TUBAL LIGATION  1997  ? [place] EXTRACTION  1998  ? ? ?There were no vitals filed for this visit. ? ? Subjective Assessment - 07/26/21 1025   ? ? Subjective  "it started hurting some at work this morning"   ? Pertinent History  HTN, DM type 2   ? Patient Stated Goals "get my wrist back and figure out what's going on"   ? Currently in Pain? No/denies   none currently when arrived at therapy but reports 11/10 this morning  ? Pain Score 0-No pain   ? ?  ?  ? ?  ? ? ? ? ? ? ? ? ? ? ? ? ? ? ? OT Treatments/Exercises (OP) - 07/26/21 1027   ? ?  ? Wrist Exercises  ? Other wrist exercises reviewed stretches for pronator and for wrist flexors in RUE, soft tissue massage review, AROM for elbow   ?  ? Modalities  ? Modalities Iontophoresis   ?  ? Iontophoresis  ? Type of Iontophoresis Dexamethasone   ? Location RUE pronator teres   ? Dose 2.0 cc   ? Time 20 minutes, 8 minute set up   ? ?  ?  ? ?  ? ? ? ? ? ? ? ? ? ? ? OT Short Term Goals - 05/16/21 1601   ? ?  ? OT SHORT TERM GOAL #1  ? Title LTGs only   ? ?  ?  ? ?  ? ? ? ? OT Long Term Goals - 07/19/21 0920   ? ?  ? OT LONG TERM GOAL #1  ? Title Pt will be independent with HEP   ? Time 8   ?  Period Weeks   ? Status Achieved   ? Target Date 07/14/21   ?  ? OT LONG TERM GOAL #2  ? Title Pt will verbalize understanding of pain management strategies   ? Time 8   ? Period Weeks   ? Status Achieved   ?  ? OT LONG TERM GOAL #3  ? Title Pt will report pain no greater than 6/10 with completion of HEP   ? Time 8   ? Period Weeks   ? Status On-going   ?  ? OT LONG TERM GOAL #4  ? Title Pt will increase 3 point pinch to 7 lbs or greater and lateral pinch to 14 lbs or greater in RUE   ? Baseline 3 point: L 7, R 6 key: L 14, R 12   ? Time 8   ? Period Weeks   ? Status Achieved   key R 20, 3 point R 22  ?  ? OT LONG TERM GOAL #5  ? Title Pt will be independent with updated HEPs   ? Time 8   ? Period Weeks   ? Status New   ? Target Date 09/13/21   ?  ? Long Term Additional Goals  ? Additional Long Term Goals Yes   ?  ? OT LONG TERM GOAL #6  ? Title Pt will verbalize understanding of adapted strategies and/or equipment PRN for increasing independence and decreasing pain with ADLs and IADLs.   ? Time 8   ? Period  Weeks   ? Status New   ? ?  ?  ? ?  ? ? ? ? ? ? ? ? Plan - 07/26/21 1026   ? ? Clinical Impression Statement Ionto # 2 today with no adverse reactions. Reports some relief after first ionto last session.   ? OT Occupational Profile and History Problem Focused Assessment - Including review of records relating to presenting problem   ? Occupational performance deficits (Please refer to evaluation for details): ADL's;IADL's   ? Body Structure / Function / Physical Skills ADL;Pain;Strength;IADL;UE functional use;Decreased knowledge of use of DME;Sensation   ? Rehab Potential Good   ? Clinical Decision Making Limited treatment options, no task modification necessary   ? Comorbidities Affecting Occupational Performance: None   ? Modification or Assistance to Complete Evaluation  No modification of tasks or assist necessary to complete eval   ? OT Frequency 2x / week   ? OT Duration 8 weeks   12 visits over 8 weeks d/t any scheduling conflicts  ? OT Treatment/Interventions Ultrasound;Cryotherapy;Electrical Stimulation;Paraffin;Energy conservation;Manual Therapy;Patient/family education;Iontophoresis;Traction;Fluidtherapy;Self-care/ADL training;Moist Heat;Therapeutic exercise;DME and/or AE instruction;Splinting;Therapeutic activities;Passive range of motion   ? Plan iontophoresis #3, review glides and add PRN   ? Consulted and Agree with Plan of Care Patient   ? ?  ?  ? ?  ? ? ?Patient will benefit from skilled therapeutic intervention in order to improve the following deficits and impairments:   ?Body Structure / Function / Physical Skills: ADL, Pain, Strength, IADL, UE functional use, Decreased knowledge of use of DME, Sensation ?  ?  ? ? ?Visit Diagnosis: ?Other disturbances of skin sensation ? ?Pain in right wrist ? ? ? ?Problem List ?Patient Active Problem List  ? Diagnosis Date Noted  ? 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 Seasonal allergic rhinitis due to pollen 06/14/2021  ? Carpal tunnel syndrome of right wrist  05/02/2021  ? Essential hypertension 01/04/2020  ? Type 2 diabetes mellitus without complication, without long-term  current use of insulin ([place]) 01/04/2020  ? Class 3 severe obesity due to excess calories with serious comorbidity and body mass index (BMI) of 40.0 to 44.9 in adult [facility]) 01/04/2020  ? Coronary artery disease due to type 2 diabetes mellitus ([place]) 01/04/2020  ? Dental caries 01/04/2020  ? ? ?Zachery Conch, OT ?07/26/2021, 10:52 AM ? ?[place] ?[place] ?[place], [place], [address] ?Phone: [phone]   Fax:  [phone] ? ?Name: Amy [name] ?MRN: [id number] ?Date of Birth: 06/13/1975 ? ?

## 2021-07-31 ENCOUNTER — Other Ambulatory Visit: Payer: Self-pay

## 2021-07-31 ENCOUNTER — Ambulatory Visit: Payer: 59 | Admitting: Occupational Therapy

## 2021-07-31 ENCOUNTER — Encounter: Payer: Self-pay | Admitting: Occupational Therapy

## 2021-07-31 DIAGNOSIS — R208 Other disturbances of skin sensation: Secondary | ICD-10-CM | POA: Diagnosis not present

## 2021-07-31 DIAGNOSIS — M25531 Pain in right wrist: Secondary | ICD-10-CM

## 2021-07-31 NOTE — Therapy (Signed)
?[facility] ?[address] ?[place], [place], [address] ?Phone: [phone]   Fax:  [phone] ? ?Occupational Therapy Treatment ? ?Patient Details  ?Name: Amy [name] ?MRN: [id number] ?Date of Birth: 03/19/1976 ?Referring Provider (OT): Clementeen Graham, MD ? ? ?Encounter Date: 07/31/2021 ? ? OT End of Session - 07/31/21 1016   ? ? Visit Number 9   ? Number of Visits 18   ? Date for OT Re-Evaluation 09/13/21   ? Authorization Type Aetna   ? Authorization Time Period VL: 60 visits   ? OT Start Time 1015   ? OT Stop Time 1054   ? OT Time Calculation (min) 39 min   ? Activity Tolerance Patient tolerated treatment well;Patient limited by pain   ? Behavior During Therapy [facility] for tasks assessed/performed   ? ?  ?  ? ?  ? ? ?Past Medical History:  ?Diagnosis Date  ? Anemia   ? Coronary artery disease   ? Diabetes mellitus without complication ([facility])   ? Excessive or frequent menstruation 09/10/2012  ? Hyperlipidemia   ? Hypertension   ? NSTEMI (non-ST elevated myocardial infarction) ([facility]) 10/30/2019  ? PONV (postoperative nausea and vomiting)   ? with ablation only  ? ? ?Past Surgical History:  ?Procedure Laterality Date  ? CHOLECYSTECTOMY  2006  ? CORONARY ANGIOPLASTY    ? DILITATION & CURRETTAGE/HYSTROSCOPY WITH HYDROTHERMAL ABLATION N/A 07/10/2013  ? Procedure: DILATATION & CURETTAGE/HYSTEROSCOPY WITH HYDROTHERMAL ABLATION;  Surgeon: Brock Bad, MD;  Location: [facility];  Service: Gynecology;  Laterality: N/A;  ? LEFT HEART CATH AND CORONARY ANGIOGRAPHY N/A 11/02/2019  ? Procedure: LEFT HEART CATH AND CORONARY ANGIOGRAPHY;  Surgeon: Tonny Bollman, MD;  Location: [facility];  Service: Cardiovascular;  Laterality: N/A;  ? TUBAL LIGATION  1997  ? [place] EXTRACTION  1998  ? ? ?There were no vitals filed for this visit. ? ? Subjective Assessment - 07/31/21 1016   ? ? Subjective  "I don't have any" Pt reports the pain has been getting better.   ?  Pertinent History HTN, DM type 2   ? Patient Stated Goals "get my wrist back and figure out what's going on"   ? Currently in Pain? No/denies   ? Pain Score 0-No pain   ? ?  ?  ? ?  ? ? ? ? ? ? ? ? ? ? ? ? ? ? ? OT Treatments/Exercises (OP) - 07/31/21 0001   ? ?  ? Exercises  ? Exercises Elbow   ?  ? Elbow Exercises  ? Forearm Pronation Limitations hammer x 10 in supination only   ?  ? Wrist Exercises  ? Other wrist exercises resistance clothespins 1-8# with no report of increased pain - working on keeping wrist in neutral with lateral pinh vs. 3 pinch. Pt req'd min verbal and visual cues   ?  ? Modalities  ? Modalities Iontophoresis   ?  ? Iontophoresis  ? Type of Iontophoresis Dexamethasone   ? Location RUE pronator teres   ? Dose 2.0 cc   ? Time 20 minutes, 8 minute set up   ? ?  ?  ? ?  ? ? ? ? ? ? ? ? ? ? ? OT Short Term Goals - 05/16/21 1601   ? ?  ? OT SHORT TERM GOAL #1  ? Title LTGs only   ? ?  ?  ? ?  ? ? ? ? OT Long  Term Goals - 07/19/21 0920   ? ?  ? OT LONG TERM GOAL #1  ? Title Pt will be independent with HEP   ? Time 8   ? Period Weeks   ? Status Achieved   ? Target Date 07/14/21   ?  ? OT LONG TERM GOAL #2  ? Title Pt will verbalize understanding of pain management strategies   ? Time 8   ? Period Weeks   ? Status Achieved   ?  ? OT LONG TERM GOAL #3  ? Title Pt will report pain no greater than 6/10 with completion of HEP   ? Time 8   ? Period Weeks   ? Status On-going   ?  ? OT LONG TERM GOAL #4  ? Title Pt will increase 3 point pinch to 7 lbs or greater and lateral pinch to 14 lbs or greater in RUE   ? Baseline 3 point: L 7, R 6 key: L 14, R 12   ? Time 8   ? Period Weeks   ? Status Achieved   key R 20, 3 point R 22  ?  ? OT LONG TERM GOAL #5  ? Title Pt will be independent with updated HEPs   ? Time 8   ? Period Weeks   ? Status New   ? Target Date 09/13/21   ?  ? Long Term Additional Goals  ? Additional Long Term Goals Yes   ?  ? OT LONG TERM GOAL #6  ? Title Pt will verbalize understanding of  adapted strategies and/or equipment PRN for increasing independence and decreasing pain with ADLs and IADLs.   ? Time 8   ? Period Weeks   ? Status New   ? ?  ?  ? ?  ? ? ? ? ? ? ? ? Plan - 07/31/21 1032   ? ? Clinical Impression Statement Ionto # 3 today with no adverse reactions. Reports some relief and decreased pain. Continues to benefit from Ionto treatment   ? OT Occupational Profile and History Problem Focused Assessment - Including review of records relating to presenting problem   ? Occupational performance deficits (Please refer to evaluation for details): ADL's;IADL's   ? Body Structure / Function / Physical Skills ADL;Pain;Strength;IADL;UE functional use;Decreased knowledge of use of DME;Sensation   ? Rehab Potential Good   ? Clinical Decision Making Limited treatment options, no task modification necessary   ? Comorbidities Affecting Occupational Performance: None   ? Modification or Assistance to Complete Evaluation  No modification of tasks or assist necessary to complete eval   ? OT Frequency 2x / week   ? OT Duration 8 weeks   12 visits over 8 weeks d/t any scheduling conflicts  ? OT Treatment/Interventions Ultrasound;Cryotherapy;Electrical Stimulation;Paraffin;Energy conservation;Manual Therapy;Patient/family education;Iontophoresis;Traction;Fluidtherapy;Self-care/ADL training;Moist Heat;Therapeutic exercise;DME and/or AE instruction;Splinting;Therapeutic activities;Passive range of motion   ? Plan iontophoresis #4, review glides and add PRN   ? Consulted and Agree with Plan of Care Patient   ? ?  ?  ? ?  ? ? ?Patient will benefit from skilled therapeutic intervention in order to improve the following deficits and impairments:   ?Body Structure / Function / Physical Skills: ADL, Pain, Strength, IADL, UE functional use, Decreased knowledge of use of DME, Sensation ?  ?  ? ? ?Visit Diagnosis: ?Other disturbances of skin sensation ? ?Pain in right wrist ? ? ? ?Problem List ?Patient Active Problem List   ? Diagnosis Date Noted  ?  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 Seasonal allergic rhinitis due to pollen 06/14/2021  ? 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  ? Essential hypertension 01/04/2020  ? Type 2 diabetes mellitus without complication, without long-term current use of insulin (HCC) 01/04/2020  ? Class 3 severe obesity due to excess calories with serious comorbidity and body mass index (BMI) of 40.0 to 44.9 in adult [facility]) 01/04/2020  ? Coronary artery disease due to type 2 diabetes mellitus (HCC) 01/04/2020  ? Dental caries 01/04/2020  ? ? ?Junious Dresser, OT ?07/31/2021, 10:57 AM ? ?[place] ?[facility] ?[address] ?[place], [place], [address] ?Phone: [phone]   Fax:  [phone] ? ?Name: Amy [name] ?MRN: [id number] ?Date of Birth: Mar 26, 1976 ? ?

## 2021-08-01 NOTE — Telephone Encounter (Signed)
Returned Nora's call and stated Its an OBGYN at [facility] with Dr. Jodi Mourning appt is April 4th/ Pt stated she will stick with this appt unless Alinda Sierras can find something sooner / please advise  ?

## 2021-08-02 ENCOUNTER — Ambulatory Visit: Payer: 59 | Admitting: Occupational Therapy

## 2021-08-02 ENCOUNTER — Other Ambulatory Visit: Payer: Self-pay

## 2021-08-02 DIAGNOSIS — R208 Other disturbances of skin sensation: Secondary | ICD-10-CM

## 2021-08-02 DIAGNOSIS — M25531 Pain in right wrist: Secondary | ICD-10-CM

## 2021-08-02 NOTE — Therapy (Signed)
[place] ?[place] ?[place], [place], [address] ?Phone: [phone]   Fax:  [phone] ? ?Occupational Therapy Treatment ? ?Patient Details  ?Name: Amy [name] ?MRN: [id number] ?Date of Birth: [birth year] ?Referring Provider (OT): Lynne Leader, MD ? ? ?Encounter Date: 08/02/2021 ? ? OT End of Session - 08/02/21 1333   ? ? Visit Number 10   ? Number of Visits 18   ? Date for OT Re-Evaluation 09/13/21   ? Authorization Type Aetna   ? Authorization Time Period VL: 60 visits   ? OT Start Time 1315   ? OT Stop Time 1355   ? OT Time Calculation (min) 40 min   ? Activity Tolerance Patient tolerated treatment well;Patient limited by pain   ? Behavior During Therapy [facility] for tasks assessed/performed   ? ?  ?  ? ?  ? ? ?Past Medical History:  ?Diagnosis Date  ? Anemia   ? Coronary artery disease   ? Diabetes mellitus without complication ([place])   ? Excessive or frequent menstruation 09/10/2012  ? Hyperlipidemia   ? Hypertension   ? NSTEMI (non-ST elevated myocardial infarction) ([place]) 10/30/2019  ? PONV (postoperative nausea and vomiting)   ? with ablation only  ? ? ?Past Surgical History:  ?Procedure Laterality Date  ? CHOLECYSTECTOMY  2006  ? CORONARY ANGIOPLASTY    ? DILITATION & CURRETTAGE/HYSTROSCOPY WITH HYDROTHERMAL ABLATION N/A 07/10/2013  ? Procedure: DILATATION & CURETTAGE/HYSTEROSCOPY WITH HYDROTHERMAL ABLATION;  Surgeon: Shelly Bombard, MD;  Location: [facility];  Service: Gynecology;  Laterality: N/A;  ? LEFT HEART CATH AND CORONARY ANGIOGRAPHY N/A 11/02/2019  ? Procedure: LEFT HEART CATH AND CORONARY ANGIOGRAPHY;  Surgeon: Sherren Mocha, MD;  Location: [facility];  Service: Cardiovascular;  Laterality: N/A;  ? TUBAL LIGATION  1997  ? [place] EXTRACTION  1998  ? ? ?There were no vitals filed for this visit. ? ? Subjective Assessment - 08/02/21 1330   ? ? Subjective  It doesn't hurt now, but it did before I came in   ? Pertinent History  HTN, DM type 2   ? Patient Stated Goals "get my wrist back and figure out what's going on"   ? Currently in Pain? Yes   ? Pain Score 6    ? Pain Location --   volar proximal forearm  ? Pain Orientation Right   ? Pain Descriptors / Indicators Shooting   ? Pain Type Chronic pain;Neuropathic pain   ? Pain Onset More than a month ago   ? Pain Frequency Constant   ? Aggravating Factors  work tasks   ? Pain Relieving Factors iontophoresis   ? ?  ?  ? ?  ? ? ?Continued to reinforce modifications at work and at home to reduce pain. Discussed that pain management strategies are a combination of task modifications, modalities, and HEP ? ?Iontophoresis #4 to volar proximal forearm x 20 min. + 5 min set up at 2.0 mA = 25 min total ? ?Issued median nerve flossing #2 HEP and demo - see pt instructions for details.  ? ? ? ? ? ? ? ? ? ? ? ? ? ? ? ? ? ? ? ? OT Education - 08/02/21 1336   ? ? Education Details median n. flossing #2   ? Person(s) Educated Patient   ? Methods Explanation;Handout;Demonstration;Verbal cues   ? Comprehension Verbalized understanding;Returned demonstration   ? ?  ?  ? ?  ? ? ? OT  Short Term Goals - 05/16/21 1601   ? ?  ? OT SHORT TERM GOAL #1  ? Title LTGs only   ? ?  ?  ? ?  ? ? ? ? OT Long Term Goals - 07/19/21 0920   ? ?  ? OT LONG TERM GOAL #1  ? Title Pt will be independent with HEP   ? Time 8   ? Period Weeks   ? Status Achieved   ? Target Date 07/14/21   ?  ? OT LONG TERM GOAL #2  ? Title Pt will verbalize understanding of pain management strategies   ? Time 8   ? Period Weeks   ? Status Achieved   ?  ? OT LONG TERM GOAL #3  ? Title Pt will report pain no greater than 6/10 with completion of HEP   ? Time 8   ? Period Weeks   ? Status On-going   ?  ? OT LONG TERM GOAL #4  ? Title Pt will increase 3 point pinch to 7 lbs or greater and lateral pinch to 14 lbs or greater in RUE   ? Baseline 3 point: L 7, R 6 key: L 14, R 12   ? Time 8   ? Period Weeks   ? Status Achieved   key R 20, 3 point R 22  ?  ?  OT LONG TERM GOAL #5  ? Title Pt will be independent with updated HEPs   ? Time 8   ? Period Weeks   ? Status New   ? Target Date 09/13/21   ?  ? Long Term Additional Goals  ? Additional Long Term Goals Yes   ?  ? OT LONG TERM GOAL #6  ? Title Pt will verbalize understanding of adapted strategies and/or equipment PRN for increasing independence and decreasing pain with ADLs and IADLs.   ? Time 8   ? Period Weeks   ? Status New   ? ?  ?  ? ?  ? ? ? ? ? ? ? ? Plan - 08/02/21 1335   ? ? Clinical Impression Statement Ionto # 4 today with no adverse reactions. Reports some relief and decreased pain. Continues to benefit from Ionto treatment   ? OT Occupational Profile and History Problem Focused Assessment - Including review of records relating to presenting problem   ? Occupational performance deficits (Please refer to evaluation for details): ADL's;IADL's   ? Body Structure / Function / Physical Skills ADL;Pain;Strength;IADL;UE functional use;Decreased knowledge of use of DME;Sensation   ? Rehab Potential Good   ? Clinical Decision Making Limited treatment options, no task modification necessary   ? Comorbidities Affecting Occupational Performance: None   ? Modification or Assistance to Complete Evaluation  No modification of tasks or assist necessary to complete eval   ? OT Frequency 2x / week   ? OT Duration 8 weeks   12 visits over 8 weeks d/t any scheduling conflicts  ? OT Treatment/Interventions Ultrasound;Cryotherapy;Electrical Stimulation;Paraffin;Energy conservation;Manual Therapy;Patient/family education;Iontophoresis;Traction;Fluidtherapy;Self-care/ADL training;Moist Heat;Therapeutic exercise;DME and/or AE instruction;Splinting;Therapeutic activities;Passive range of motion   ? Plan iontophoresis #5, review glides and add PRN   ? Consulted and Agree with Plan of Care Patient   ? ?  ?  ? ?  ? ? ?Patient will benefit from skilled therapeutic intervention in order to improve the following deficits and  impairments:   ?Body Structure / Function / Physical Skills: ADL, Pain, Strength, IADL, UE functional use, Decreased knowledge of  use of DME, Sensation ?  ?  ? ? ?Visit Diagnosis: ?Other disturbances of skin sensation ? ?Pain in right wrist ? ? ? ?Problem List ?Patient Active Problem List  ? Diagnosis Date Noted  ? 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 Seasonal allergic rhinitis due to pollen 06/14/2021  ? 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  ? Essential hypertension 01/04/2020  ? Type 2 diabetes mellitus without complication, without long-term current use of insulin ([place]) 01/04/2020  ? Class 3 severe obesity due to excess calories with serious comorbidity and body mass index (BMI) of 40.0 to 44.9 in adult [facility]) 01/04/2020  ? Coronary artery disease due to type 2 diabetes mellitus ([place]) 01/04/2020  ? Dental caries 01/04/2020  ? ? ?Carey Bullocks, OTR/L ?08/02/2021, 1:41 PM ? ? ?[place] ?[place], [place], [address] ?Phone: [phone]   Fax:  [phone] ? ?Name: Amy [name] ?MRN: [id number] ?Date of Birth: 1976/01/07 ? ?

## 2021-08-02 NOTE — Patient Instructions (Signed)
MEDIAN NERVE: Flossing II ? ? ? ?Stand with right arm at side, palm down, hand and fingers pulled up, arm turned out. Simultaneously depress shoulder and tilt head toward involved shoulder. Keep trunk straight. ?Do _1__ sets of _10__ repetitions per session. Do _3__ sessions per day. ? ?  ? ?

## 2021-08-07 ENCOUNTER — Other Ambulatory Visit: Payer: Self-pay

## 2021-08-07 ENCOUNTER — Ambulatory Visit: Payer: 59 | Admitting: Occupational Therapy

## 2021-08-07 DIAGNOSIS — R208 Other disturbances of skin sensation: Secondary | ICD-10-CM | POA: Diagnosis not present

## 2021-08-07 DIAGNOSIS — M25531 Pain in right wrist: Secondary | ICD-10-CM

## 2021-08-07 NOTE — Therapy (Signed)
[facility] ?[address] ?[place], [place], [address] ?Phone: [phone]   Fax:  [phone] ? ?Occupational Therapy Treatment ? ?Patient Details  ?Name: Amy [name] ?MRN: [id number] ?Date of Birth: 11/01/1975 ?Referring Provider (OT): Clementeen GrahamEvan Corey, MD ? ? ?Encounter Date: 08/07/2021 ? ? OT End of Session - 08/07/21 0958   ? ? Visit Number 11   ? Number of Visits 18   ? Date for OT Re-Evaluation 09/13/21   ? Authorization Type Aetna   ? Authorization Time Period VL: 60 visits   ? OT Start Time [phone] [id number]   ? OT Stop Time 1035   ? OT Time Calculation (min) 39 min   ? Activity Tolerance Patient tolerated treatment well;Patient limited by pain   ? Behavior [facility] for tasks assessed/performed   ? ?  ?  ? ?  ? ? ?Past Medical History:  ?Diagnosis Date  ? Anemia   ? Coronary artery disease   ? Diabetes mellitus without complication (HCC)   ? Excessive or frequent menstruation 09/10/2012  ? Hyperlipidemia   ? Hypertension   ? NSTEMI (non-ST elevated myocardial infarction) (HCC) 10/30/2019  ? PONV (postoperative nausea and vomiting)   ? with ablation only  ? ? ?Past Surgical History:  ?Procedure Laterality Date  ? CHOLECYSTECTOMY  2006  ? CORONARY ANGIOPLASTY    ? DILITATION & CURRETTAGE/HYSTROSCOPY WITH HYDROTHERMAL ABLATION N/A 07/10/2013  ? Procedure: DILATATION & CURETTAGE/HYSTEROSCOPY WITH HYDROTHERMAL ABLATION;  Surgeon: Brock Badharles A Harper, MD;  Location: [facility];  Service: Gynecology;  Laterality: N/A;  ? LEFT HEART CATH AND CORONARY ANGIOGRAPHY N/A 11/02/2019  ? Procedure: LEFT HEART CATH AND CORONARY ANGIOGRAPHY;  Surgeon: Tonny Bollmanooper, Michael, MD;  [facility];  Service: Cardiovascular;  Laterality: N/A;  ? TUBAL LIGATION  1997  ? WISDOM TOOTH EXTRACTION  1998  ? ? ?There were no vitals filed for this visit. ? ? Subjective Assessment - 08/07/21 0958   ? ? Subjective  Pt denies any pain.   ? Pertinent History HTN, DM type 2   ? Patient  Stated Goals "get my wrist back and figure out what's going on"   ? Currently in Pain? No/denies   ? Pain Score 0-No pain   ? ?  ?  ? ?  ? ? ? ? ? ? ? ? ? ? ? ? ? ? ? OT Treatments/Exercises (OP) - 08/07/21 1005   ? ?  ? Modalities  ? Modalities Iontophoresis   ?  ? Iontophoresis  ? Type of Iontophoresis Dexamethasone   #5  ? Location RUE pronator teres   ? Dose 2.0 cc   ? Time 20 minutes, 8 minute set up   ? ?  ?  ? ?  ? ? ? ? ? ? ? ? ? OT Education - 08/07/21 1036   ? ? Education Details Median Nerve flossing reviewed and added to, Access Code: [email or web address]   ? Person(s) Educated Patient   ? Methods Explanation;Handout;Demonstration;Verbal cues   ? Comprehension Verbalized understanding;Returned demonstration   ? ?  ?  ? ?  ? ? ? OT Short Term Goals - 05/16/21 1601   ? ?  ? OT SHORT TERM GOAL #1  ? Title LTGs only   ? ?  ?  ? ?  ? ? ? ? OT Long Term Goals - 07/19/21 0920   ? ?  ? OT LONG TERM GOAL #1  ? Title Pt will be independent with  HEP   ? Time 8   ? Period Weeks   ? Status Achieved   ? Target Date 07/14/21   ?  ? OT LONG TERM GOAL #2  ? Title Pt will verbalize understanding of pain management strategies   ? Time 8   ? Period Weeks   ? Status Achieved   ?  ? OT LONG TERM GOAL #3  ? Title Pt will report pain no greater than 6/10 with completion of HEP   ? Time 8   ? Period Weeks   ? Status On-going   ?  ? OT LONG TERM GOAL #4  ? Title Pt will increase 3 point pinch to 7 lbs or greater and lateral pinch to 14 lbs or greater in RUE   ? Baseline 3 point: L 7, R 6 key: L 14, R 12   ? Time 8   ? Period Weeks   ? Status Achieved   key R 20, 3 point R 22  ?  ? OT LONG TERM GOAL #5  ? Title Pt will be independent with updated HEPs   ? Time 8   ? Period Weeks   ? Status New   ? Target Date 09/13/21   ?  ? Long Term Additional Goals  ? Additional Long Term Goals Yes   ?  ? OT LONG TERM GOAL #6  ? Title Pt will verbalize understanding of adapted strategies and/or equipment PRN for increasing independence and decreasing pain  with ADLs and IADLs.   ? Time 8   ? Period Weeks   ? Status New   ? ?  ?  ? ?  ? ? ? ? ? ? ? ? Plan - 08/07/21 1006   ? ? Clinical Impression Statement Ionto # 5 today with no adverse reactions. Reports some relief and decreased pain. Continues to benefit from Ionto treatment   ? OT Occupational Profile and History Problem Focused Assessment - Including review of records relating to presenting problem   ? Occupational performance deficits (Please refer to evaluation for details): ADL's;IADL's   ? Body Structure / Function / Physical Skills ADL;Pain;Strength;IADL;UE functional use;Decreased knowledge of use of DME;Sensation   ? Rehab Potential Good   ? Clinical Decision Making Limited treatment options, no task modification necessary   ? Comorbidities Affecting Occupational Performance: None   ? Modification or Assistance to Complete Evaluation  No modification of tasks or assist necessary to complete eval   ? OT Frequency 2x / week   ? OT Duration 8 weeks   12 visits over 8 weeks d/t any scheduling conflicts  ? OT Treatment/Interventions Ultrasound;Cryotherapy;Electrical Stimulation;Paraffin;Energy conservation;Manual Therapy;Patient/family education;Iontophoresis;Traction;Fluidtherapy;Self-care/ADL training;Moist Heat;Therapeutic exercise;DME and/or AE instruction;Splinting;Therapeutic activities;Passive range of motion   ? Plan iontophoresis #6, review glides and add PRN, initiate strengthening and review d/c plan (discussed finishing up at 3/23 visit)   ? Consulted and Agree with Plan of Care Patient   ? ?  ?  ? ?  ? ? ?Patient will benefit from skilled therapeutic intervention in order to improve the following deficits and impairments:   ?Body Structure / Function / Physical Skills: ADL, Pain, Strength, IADL, UE functional use, Decreased knowledge of use of DME, Sensation ?  ?  ? ? ?Visit Diagnosis: ?Other disturbances of skin sensation ? ?Pain in right wrist ? ? ? ?Problem List ?Patient Active Problem List  ?  Diagnosis Date Noted  ? 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 Seasonal allergic rhinitis due to pollen  06/14/2021  ? 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  ? Essential hypertension 01/04/2020  ? Type 2 diabetes mellitus without complication, without long-term current use of insulin (HCC) 01/04/2020  ? Class 3 severe obesity due to excess calories with serious comorbidity and body mass index (BMI) of 40.0 to 44.9 in adult [facility]) 01/04/2020  ? Coronary artery disease due to type 2 diabetes mellitus (HCC) 01/04/2020  ? Dental caries 01/04/2020  ? ? ?Junious Dresser, OT ?08/07/2021, 10:36 AM ? ?[facility] ?[address] ?[place], [place], [address] ?Phone: [phone]   Fax:  [phone] ? ?Name: Amy [name] ?MRN: [id number] ?Date of Birth: 1975-12-31 ? ?

## 2021-08-07 NOTE — Patient Instructions (Signed)
Access Code: [email or web address] ?URL: [email or web address] ?Date: 08/07/2021 ?Prepared by: Kallie Edward ? ?Exercises ?Median Nerve Flossing - Tray - 1 x daily - 7 x weekly - 3 sets - 10 reps ?Brachial Plexus, Median Nerve Glide: The Tray - 1 x daily - 7 x weekly - 3 sets - 10 reps ?Median Nerve Flossing - 1 x daily - 7 x weekly - 3 sets - 10 reps ?Median Nerve Rocking - 1 x daily - 7 x weekly - 3 sets - 10 reps ? ?

## 2021-08-08 NOTE — Telephone Encounter (Signed)
Left voicemail.  ?Patient aware of message from [employer].  ?

## 2021-08-09 ENCOUNTER — Other Ambulatory Visit: Payer: Self-pay

## 2021-08-09 ENCOUNTER — Ambulatory Visit: Payer: 59 | Admitting: Occupational Therapy

## 2021-08-09 DIAGNOSIS — R208 Other disturbances of skin sensation: Secondary | ICD-10-CM

## 2021-08-09 DIAGNOSIS — M6281 Muscle weakness (generalized): Secondary | ICD-10-CM

## 2021-08-09 NOTE — Patient Instructions (Signed)
Wrist Extension: Isometric ? ? ? ?Withright forearm resting palm down on thigh, resist upward movement of hand with other hand. Hold _10___ seconds. Relax. ?Repeat __5__ times per set. Do __1-2__ sessions per day. ? ?Flexion (Isometric) ? ? ? ?With forearm held steady, palm up, use other hand to resist upward movement of hand at wrist. Hold __10__ seconds. Relax. ?Repeat __5__ times. Do _1-2___ sessions per day. ? ? ?Wrist Radial Deviation: Isometric ? ? ? ?With right forearm resting on thigh, thumb up, use other hand to resist upward movement of hand at wrist. Hold _10___ seconds. Relax. ?Repeat __5__ times per set.  Do _1-2___ sessions per day. ? ? ?

## 2021-08-09 NOTE — Therapy (Signed)
[facility] ?[place], [place], [address] ?Phone: [phone]   Fax:  [phone] ? ?Occupational Therapy Treatment ? ?Patient Details  ?Name: Amy [name] ?MRN: [id number] ?Date of Birth: August 11, 1975 ?Referring Provider (OT): Lynne Leader, MD ? ? ?Encounter Date: 08/09/2021 ? ? OT End of Session - 08/09/21 1036   ? ? Visit Number 12   ? Number of Visits 18   ? Date for OT Re-Evaluation 09/13/21   ? Authorization Type Aetna   ? Authorization Time Period VL: 60 visits   ? OT Start Time 1010   ? OT Stop Time 1100   ? OT Time Calculation (min) 50 min   ? Activity Tolerance Patient tolerated treatment well;Patient limited by pain   ? Behavior During Therapy [facility] for tasks assessed/performed   ? ?  ?  ? ?  ? ? ?Past Medical History:  ?Diagnosis Date  ? Anemia   ? Coronary artery disease   ? Diabetes mellitus without complication ([place])   ? Excessive or frequent menstruation 09/10/2012  ? Hyperlipidemia   ? Hypertension   ? NSTEMI (non-ST elevated myocardial infarction) ([place]) 10/30/2019  ? PONV (postoperative nausea and vomiting)   ? with ablation only  ? ? ?Past Surgical History:  ?Procedure Laterality Date  ? CHOLECYSTECTOMY  2006  ? CORONARY ANGIOPLASTY    ? DILITATION & CURRETTAGE/HYSTROSCOPY WITH HYDROTHERMAL ABLATION N/A 07/10/2013  ? Procedure: DILATATION & CURETTAGE/HYSTEROSCOPY WITH HYDROTHERMAL ABLATION;  Surgeon: Shelly Bombard, MD;  Location: [facility];  Service: Gynecology;  Laterality: N/A;  ? LEFT HEART CATH AND CORONARY ANGIOGRAPHY N/A 11/02/2019  ? Procedure: LEFT HEART CATH AND CORONARY ANGIOGRAPHY;  Surgeon: Sherren Mocha, MD;  Location: [facility];  Service: Cardiovascular;  Laterality: N/A;  ? TUBAL LIGATION  1997  ? [place] EXTRACTION  1998  ? ? ?There were no vitals filed for this visit. ? ? Subjective Assessment - 08/09/21 1013   ? ? Subjective  Pt denies any pain.   ? Pertinent History HTN, DM type 2   ? Patient  Stated Goals "get my wrist back and figure out what's going on"   ? Currently in Pain? No/denies   ? ?  ?  ? ?  ? ? ? ? ? ? ? ? ? ? ? ? ? ? ? OT Treatments/Exercises (OP) - 08/09/21 0001   ? ?  ? ADLs  ? Writing practiced writing w/ and w/o built up pen - pt preferred built up pen. Issued tan and red foam for writing   ?  ? Exercises  ? Exercises Wrist   Reviewed all median n. flossing ex's issued via medbridge  ?  ? Wrist Exercises  ? Other wrist exercises Issued wrist isometric strengthening HEP - see pt instructions for details   ?  ? Iontophoresis  ? Type of Iontophoresis Dexamethasone   #6  ? Location RUE pronator teres   ? Dose 2.0 cc   ? Time 20 minutes, 5 minute set up = 25 min total   ? ?  ?  ? ?  ? ? ? ? ? ? ? ? ? OT Education - 08/09/21 1033   ? ? Education Details isometric wrist strengthening HEP   ? Person(s) Educated Patient   ? Methods Explanation;Handout;Demonstration;Verbal cues   ? Comprehension Verbalized understanding;Returned demonstration   ? ?  ?  ? ?  ? ? ? OT Short Term Goals - 05/16/21 1601   ? ?  ?  OT SHORT TERM GOAL #1  ? Title LTGs only   ? ?  ?  ? ?  ? ? ? ? OT Long Term Goals - 08/09/21 1037   ? ?  ? OT LONG TERM GOAL #1  ? Title Pt will be independent with HEP   ? Time 8   ? Period Weeks   ? Status Achieved   ? Target Date 07/14/21   ?  ? OT LONG TERM GOAL #2  ? Title Pt will verbalize understanding of pain management strategies   ? Time 8   ? Period Weeks   ? Status Achieved   ?  ? OT LONG TERM GOAL #3  ? Title Pt will report pain no greater than 6/10 with completion of HEP   ? Time 8   ? Period Weeks   ? Status Achieved   ?  ? OT LONG TERM GOAL #4  ? Title Pt will increase 3 point pinch to 7 lbs or greater and lateral pinch to 14 lbs or greater in RUE   ? Baseline 3 point: L 7, R 6 key: L 14, R 12   ? Time 8   ? Period Weeks   ? Status Achieved   key R 20, 3 point R 22  ?  ? OT LONG TERM GOAL #5  ? Title Pt will be independent with updated HEPs   ? Time 8   ? Period Weeks   ?  Status Achieved   ? Target Date 09/13/21   ?  ? OT LONG TERM GOAL #6  ? Title Pt will verbalize understanding of adapted strategies and/or equipment PRN for increasing independence and decreasing pain with ADLs and IADLs.   ? Time 8   ? Period Weeks   ? Status Achieved   ? ?  ?  ? ?  ? ? ? ? ? ? ? ? Plan - 08/09/21 1104   ? ? Clinical Impression Statement Pt completed final iontophoresis today. Pt has met all LTG's at this time   ? OT Occupational Profile and History Problem Focused Assessment - Including review of records relating to presenting problem   ? Occupational performance deficits (Please refer to evaluation for details): ADL's;IADL's   ? Body Structure / Function / Physical Skills ADL;Pain;Strength;IADL;UE functional use;Decreased knowledge of use of DME;Sensation   ? Rehab Potential Good   ? Clinical Decision Making Limited treatment options, no task modification necessary   ? Comorbidities Affecting Occupational Performance: None   ? Modification or Assistance to Complete Evaluation  No modification of tasks or assist necessary to complete eval   ? OT Frequency 2x / week   ? OT Duration 8 weeks   12 visits over 8 weeks d/t any scheduling conflicts  ? OT Treatment/Interventions Ultrasound;Cryotherapy;Electrical Stimulation;Paraffin;Energy conservation;Manual Therapy;Patient/family education;Iontophoresis;Traction;Fluidtherapy;Self-care/ADL training;Moist Heat;Therapeutic exercise;DME and/or AE instruction;Splinting;Therapeutic activities;Passive range of motion   ? Plan Pt to return for one more visit on 08/16/21 to assess how isometric strengthening HEP is going - anticipate d/c then if all is going well   ? Consulted and Agree with Plan of Care Patient   ? ?  ?  ? ?  ? ? ?Patient will benefit from skilled therapeutic intervention in order to improve the following deficits and impairments:   ?Body Structure / Function / Physical Skills: ADL, Pain, Strength, IADL, UE functional use, Decreased knowledge of  use of DME, Sensation ?  ?  ? ? ?Visit Diagnosis: ?Other disturbances of skin  sensation ? ?Muscle weakness (generalized) ? ? ? ?Problem List ?Patient Active Problem List  ? Diagnosis Date Noted  ? 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 Seasonal allergic rhinitis due to pollen 06/14/2021  ? 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  ? Essential hypertension 01/04/2020  ? Type 2 diabetes mellitus without complication, without long-term current use of insulin ([place]) 01/04/2020  ? Class 3 severe obesity due to excess calories with serious comorbidity and body mass index (BMI) of 40.0 to 44.9 in adult [facility]) 01/04/2020  ? Coronary artery disease due to type 2 diabetes mellitus ([place]) 01/04/2020  ? Dental caries 01/04/2020  ? ? ?Carey Bullocks, OTR/L ?08/09/2021, 11:07 AM ? ?[place] ?[place] ?[place], [place], [address] ?Phone: [phone]   Fax:  [phone] ? ?Name: Amy [name] ?MRN: [id number] ?Date of Birth: November 23, 1975 ? ?

## 2021-08-09 NOTE — Progress Notes (Signed)
Yes, I agree with the lab results

## 2021-08-14 ENCOUNTER — Ambulatory Visit: Payer: 59 | Admitting: Occupational Therapy

## 2021-08-16 ENCOUNTER — Ambulatory Visit: Payer: 59 | Admitting: Occupational Therapy

## 2021-08-18 ENCOUNTER — Encounter: Payer: 59 | Admitting: Occupational Therapy

## 2021-08-21 ENCOUNTER — Encounter: Payer: 59 | Admitting: Occupational Therapy

## 2021-08-23 ENCOUNTER — Encounter: Payer: 59 | Admitting: Occupational Therapy

## 2021-08-28 ENCOUNTER — Encounter: Payer: 59 | Admitting: Occupational Therapy

## 2021-08-29 ENCOUNTER — Ambulatory Visit: Payer: BC Managed Care – PPO | Admitting: Obstetrics

## 2021-08-29 ENCOUNTER — Encounter: Payer: Self-pay | Admitting: Obstetrics

## 2021-08-29 VITALS — BP 182/93 | HR 75 | Ht 62.0 in | Wt 216.8 lb

## 2021-08-29 DIAGNOSIS — R87610 Atypical squamous cells of undetermined significance on cytologic smear of cervix (ASC-US): Secondary | ICD-10-CM | POA: Diagnosis not present

## 2021-08-29 NOTE — Progress Notes (Signed)
Patient ID: Amy [name], female   DOB: 1975-09-10, 46 y.o.   MRN: [id number] ? ?Chief Complaint  ?Patient presents with  ? New Patient (Initial Visit)  ? ? ?HPI ?Amy [name] is a 46 y.o. female.  Patient referred by PCP for abnormal pap.  She has no complaints. ?HPI ? ?Past Medical History:  ?Diagnosis Date  ? Anemia   ? Coronary artery disease   ? Diabetes mellitus without complication ([place])   ? Excessive or frequent menstruation 09/10/2012  ? Hyperlipidemia   ? Hypertension   ? NSTEMI (non-ST elevated myocardial infarction) ([place]) 10/30/2019  ? PONV (postoperative nausea and vomiting)   ? with ablation only  ? ? ?Past Surgical History:  ?Procedure Laterality Date  ? CHOLECYSTECTOMY  2006  ? CORONARY ANGIOPLASTY    ? DILITATION & CURRETTAGE/HYSTROSCOPY WITH HYDROTHERMAL ABLATION N/A 07/10/2013  ? Procedure: DILATATION & CURETTAGE/HYSTEROSCOPY WITH HYDROTHERMAL ABLATION;  Surgeon: Shelly Bombard, MD;  Location: [facility];  Service: Gynecology;  Laterality: N/A;  ? LEFT HEART CATH AND CORONARY ANGIOGRAPHY N/A 11/02/2019  ? Procedure: LEFT HEART CATH AND CORONARY ANGIOGRAPHY;  Surgeon: Sherren Mocha, MD;  Location: [facility];  Service: Cardiovascular;  Laterality: N/A;  ? TUBAL LIGATION  1997  ? [place] EXTRACTION  1998  ? ? ?Family History  ?Problem Relation Age of Onset  ? Breast cancer Mother   ? Hypertension Mother   ? Hypertension Father   ? Heart failure Maternal Aunt   ? Leukemia Paternal Uncle   ? COPD Other   ? Hypertension Other   ? Diabetes Other   ? Cancer Other   ? Colon cancer Neg Hx   ? Colon polyps Neg Hx   ? Esophageal cancer Neg Hx   ? Rectal cancer Neg Hx   ? Stomach cancer Neg Hx   ? ? ?Social History ?Social History  ? ?Tobacco Use  ? Smoking status: Never  ? Smokeless tobacco: Never  ?Vaping Use  ? Vaping Use: Never used  ?Substance Use Topics  ? Alcohol use: No  ? Drug use: No  ? ? ?Allergies  ?Allergen Reactions  ? Amlodipine Hives  ? Hydrocodone-Acetaminophen Hives  and Itching  ?  And itching ?Itching and hives  ? Losartan Potassium-Hctz Other (See Comments)  ?  Dizziness  ? Tetanus Antitoxin   ?  Arm swelling size of a softball.   ? Tetanus Toxoids   ?  Arm swelling size of a softball.   ? ? ?Current Outpatient Medications  ?Medication Sig Dispense Refill  ? aspirin 81 MG EC tablet Take 1 tablet (81 mg total) by mouth daily. 90 tablet 3  ? atorvastatin (LIPITOR) 40 MG tablet TAKE 1 TABLET (40 MG TOTAL) BY MOUTH DAILY. 90 tablet 3  ? Blood Glucose Monitoring Suppl (TRUE METRIX METER) w/Device KIT Use to check blood sugars twice per day 1 kit 0  ? carvedilol (COREG) 25 MG tablet Take 1 tablet (25 mg total) by mouth 2 (two) times daily with a meal. 60 tablet 4  ? empagliflozin (JARDIANCE) 10 MG TABS tablet Take 1 tablet (10 mg total) by mouth daily. 30 tablet 3  ? fexofenadine (ALLEGRA) 180 MG tablet Take 180 mg by mouth daily.    ? fluticasone (FLONASE) 50 MCG/ACT nasal spray Place 2 sprays into both nostrils daily. 16 g 6  ? gabapentin (NEURONTIN) 300 MG capsule Take 1 capsule (300 mg total) by mouth at bedtime. 30 capsule 2  ? glucose blood (TRUE  METRIX BLOOD GLUCOSE TEST) test strip Use as instructed to check BS twice per day 100 each 12  ? ibuprofen (ADVIL) 600 MG tablet Take 1 tablet (600 mg total) by mouth every 8 (eight) hours as needed. 90 tablet 1  ? metroNIDAZOLE (FLAGYL) 500 MG tablet Take 1 tablet (500 mg total) by mouth 2 (two) times daily. 14 tablet 0  ? Multiple Vitamin (MULTIVITAMIN WITH MINERALS) TABS tablet Take 1 tablet by mouth daily.    ? nitroGLYCERIN (NITROSTAT) 0.4 MG SL tablet Place 1 tablet (0.4 mg total) under the tongue every 5 (five) minutes x 3 doses as needed for chest pain. 25 tablet 11  ? nortriptyline (PAMELOR) 25 MG capsule Take 1 capsule (25 mg total) by mouth at bedtime. To prevent pain the next day (Patient taking differently: Take 25 mg by mouth at bedtime. To prevent pain the next day takes as needed) 30 capsule 2  ? TRUEplus Lancets 28G  MISC Use when checking BS twice per day 100 each 11  ? valsartan-hydrochlorothiazide (DIOVAN-HCT) 80-12.5 MG tablet Take 1 tablet by mouth daily. 90 tablet 3  ? ?No current facility-administered medications for this visit.  ? ? ?Review of Systems ?Review of Systems ?Constitutional: negative for fatigue and weight loss ?Respiratory: negative for cough and wheezing ?Cardiovascular: negative for chest pain, fatigue and palpitations ?Gastrointestinal: negative for abdominal pain and change in bowel habits ?Genitourinary:negative ?Integument/breast: negative for nipple discharge ?Musculoskeletal:negative for myalgias ?Neurological: negative for gait problems and tremors ?Behavioral/Psych: negative for abusive relationship, depression ?Endocrine: negative for temperature intolerance    ?  ?Blood pressure (!) 182/93, pulse 75, height 5' 2"  (1.575 m), weight 216 lb 12.8 oz (98.3 kg). ? ?Physical Exam ?Physical Exam ?General:   Alert and no distress  ?Skin:   no rash or abnormalities  ?Lungs:   clear to auscultation bilaterally  ?Heart:   regular rate and rhythm, S1, S2 normal, no murmur, click, rub or gallop  ?The remainder of the physical exam deferred due to the type of encounter. ? ?I have spent a total of 15 minutes of face-to-face and non-face-to-face time, excluding clinical staff time, reviewing notes and preparing to see patient, ordering tests and/or medications, and counseling the patient.  ? ?Data Reviewed ?Pap Smear ? ?Assessment  ?   ?1. ASCUS of cervix with negative high risk HPV ?- discussed results with patient and all questions answered to patient's satisfaction ?- repeat pap in 1 year  ?  ? ?Plan ?  Follow up in 1 year ? ? ? Shelly Bombard, MD ?08/29/2021 3:46 PM  ?

## 2021-08-29 NOTE — Progress Notes (Signed)
New Patient is in the office to establish care.  ?Referred from PCP for ASC-US on pap from 07/13/2021 ?Mother had hx of Breast Cancer ?

## 2021-08-30 ENCOUNTER — Encounter: Payer: 59 | Admitting: Occupational Therapy

## 2021-08-30 NOTE — Progress Notes (Deleted)
? ?  I, Christoper Fabian, LAT, ATC, am serving as scribe for Dr. Clementeen Graham. ? ?Amy [name] is a 46 y.o. female who presents to [facility] at [facility] today for f/u of R wrist and distal forearm pain likely due to pronator syndrome that began in June 2021 after having a cardiac catherization.  She was last seen by Dr Denyse Amass on 07/11/21 and noted some improvement in her symptoms.  She was advised to con't OT of which she's completed 12 visits.  Today, pt reports  ? ?Diagnostic testing: R UE NCV/EMG- 05/11/21 ? ?Pertinent review of systems: *** ? ?Relevant historical information: *** ? ? ?Exam:  ?There were no vitals taken for this visit. ?General: Well Developed, well nourished, and in no acute distress.  ? ?MSK: *** ? ? ? ?Lab and Radiology Results ?No results found for this or any previous visit (from the past 72 hour(s)). ?No results found. ? ? ? ? ?Assessment and Plan: ?46 y.o. female with *** ? ? ?PDMP not reviewed this encounter. ?No orders of the defined types were placed in this encounter. ? ?No orders of the defined types were placed in this encounter. ? ? ? ?Discussed warning signs or symptoms. Please see discharge instructions. Patient expresses understanding. ? ? ?*** ? ?

## 2021-09-05 ENCOUNTER — Ambulatory Visit: Payer: 59 | Admitting: Family Medicine

## 2021-09-12 ENCOUNTER — Ambulatory Visit: Payer: BC Managed Care – PPO | Attending: Critical Care Medicine | Admitting: Critical Care Medicine

## 2021-09-12 ENCOUNTER — Encounter: Payer: Self-pay | Admitting: Critical Care Medicine

## 2021-09-12 ENCOUNTER — Other Ambulatory Visit: Payer: Self-pay

## 2021-09-12 VITALS — BP 160/90 | HR 86 | Wt 221.8 lb

## 2021-09-12 DIAGNOSIS — E119 Type 2 diabetes mellitus without complications: Secondary | ICD-10-CM | POA: Diagnosis not present

## 2021-09-12 DIAGNOSIS — I2510 Atherosclerotic heart disease of native coronary artery without angina pectoris: Secondary | ICD-10-CM

## 2021-09-12 DIAGNOSIS — E1159 Type 2 diabetes mellitus with other circulatory complications: Secondary | ICD-10-CM

## 2021-09-12 DIAGNOSIS — I10 Essential (primary) hypertension: Secondary | ICD-10-CM

## 2021-09-12 DIAGNOSIS — Z6841 Body Mass Index (BMI) 40.0 and over, adult: Secondary | ICD-10-CM

## 2021-09-12 LAB — GLUCOSE, POCT (MANUAL RESULT ENTRY): POC Glucose: 185 mg/dl — AB (ref 70–99)

## 2021-09-12 LAB — POCT GLYCOSYLATED HEMOGLOBIN (HGB A1C): HbA1c, POC (controlled diabetic range): 6.6 % (ref 0.0–7.0)

## 2021-09-12 MED ORDER — ATORVASTATIN CALCIUM 40 MG PO TABS
ORAL_TABLET | Freq: Every day | ORAL | 3 refills | Status: DC
  Filled 2021-09-12: qty 30, 30d supply, fill #0

## 2021-09-12 MED ORDER — VALSARTAN-HYDROCHLOROTHIAZIDE 160-25 MG PO TABS
1.0000 | ORAL_TABLET | Freq: Every day | ORAL | 3 refills | Status: DC
  Filled 2021-09-12: qty 30, 30d supply, fill #0

## 2021-09-12 MED ORDER — EMPAGLIFLOZIN 10 MG PO TABS
10.0000 mg | ORAL_TABLET | Freq: Every day | ORAL | 3 refills | Status: DC
  Filled 2021-09-12 – 2021-09-14 (×2): qty 30, 30d supply, fill #0

## 2021-09-12 MED ORDER — CARVEDILOL 25 MG PO TABS
25.0000 mg | ORAL_TABLET | Freq: Two times a day (BID) | ORAL | 4 refills | Status: DC
  Filled 2021-09-12 – 2021-12-19 (×2): qty 60, 30d supply, fill #0

## 2021-09-12 NOTE — Assessment & Plan Note (Signed)
As per diabetes and hypertension assessment ?

## 2021-09-12 NOTE — Assessment & Plan Note (Signed)
Not at goal plan to increase valsartan HCT to 160/25 continue carvedilol as prescribed ? ?Patient given a lifestyle medicine handout to follow and focus on a diet and exercise program she will return in short-term follow-up ?

## 2021-09-12 NOTE — Progress Notes (Deleted)
   I, Christoper Fabian, LAT, ATC, am serving as scribe for Dr. Clementeen Graham.  Amy [name] is a 46 y.o. female who presents to [facility] at [facility] today for for f/u of R wrist and distal forearm pain likely due to pronator syndrome.  She was last seen by Dr. Denyse Amass on 07/11/21 and was encouraged to con't OT of which she's completed 12 visits.  Today, pt reports   Diagnostic testing: R UE NCV/EMG- 05/11/21  Pertinent review of systems: ***  Relevant historical information: ***   Exam:  There were no vitals taken for this visit. General: Well Developed, well nourished, and in no acute distress.   MSK: ***    Lab and Radiology Results No results found for this or any previous visit (from the past 72 hour(s)). No results found.     Assessment and Plan: 46 y.o. female with ***   PDMP not reviewed this encounter. No orders of the defined types were placed in this encounter.  No orders of the defined types were placed in this encounter.    Discussed warning signs or symptoms. Please see discharge instructions. Patient expresses understanding.   ***

## 2021-09-12 NOTE — Assessment & Plan Note (Addendum)
A1c 6.6 at goal continue Jardiance along ?Patient given a lifestyle medicine handout to follow on proper diet ?Need to assess renal function ?

## 2021-09-12 NOTE — Progress Notes (Signed)
? ?Established Patient Office Visit ? ?Subjective:  ?Patient ID: Amy [name], female    DOB: [birth year]  Age: 46 y.o. MRN: [id number] ? ?CC:  ?Chief Complaint  ?Patient presents with  ? Hypertension  ? Diabetes  ? ? ?HPI ?06/14/21 ?Amy [name] presents for primary care follow up for hypertension and Type 2 Diabetes. At her last visit, her medications for hypertension were changed due to side effects. She is now taking valsartan-HCTZ 80-12.5 mg and carvedilol 25 mg twice daily. She says this combination is working very well for her and she has no side effects. Her in office readings were 147/102 and 144/78. She says this is much higher than her home readings. She takes home blood pressures, and states that they typically are in the 120s/80s. This morning's reading was 126/70.  ? ?She states she has been taking her Jardiance for her diabetes as directed. She reports no symptoms from her diabetes. She takes her blood sugars once daily, typically in the morning. This typically measures between 120-130 mg/dL. She will occasionally do a second reading in the evening.  ? 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 She says she now is able to focus on her health much more. She says her sleep has improved drastically. She has started engaging in more aggressive lifestyle management with her diet. She says she is eating more plant-based foods. She tries to eat salads with a variety of greens and fruit for lunch. She doesn't enjoy eating meat much, but she is eating salmon and other fish. She has found that by making these changes, she has started craving nutrient-dense foods over processed foods. She says her husband is very supportive of this effort and is also working on improving his diet, so she feels much more committed to keeping this lifestyle change up. She has not started exercising yet, but is interested in walking.   ? ?She has seen physical medicine and rehabilitation, neurology, and occupational therapy for persistent pain in her wrist that began after her cardiac catheterization. She had nerve conduction testing that was negative for abnormalities. She is planning on having some imaging that was ordered by orthopedics for targeted injections. She has started nortriptyline for the pain and this has helped. She doesn't think the occupational therapy is very helpful.  ? ?Her only complaint today is some sinus congestion and pressure. She believes this is due to seasonal allergies. She typically takes Allegra/fexofenadine daily for allergies. ? ?She is due for colon cancer screening. She has scheduled this for February 6 with GI.  ? ?09/12/2021 ?This patient is seen in return follow-up for diabetes hypertension obesity.  On arrival blood pressure elevated 160/90 blood sugar 185 A1c is 6.6.  She states she has lower blood sugars at home she just had a heavy carbohydrate breakfast. ? ?Patient has no real complaints at this visit.  She states her diet is intermittently not healthy.  She is not exercising like she should.  She has been compliant with her blood pressure medications. ?Past Medical History:  ?Diagnosis Date  ? Anemia   ? Coronary artery disease   ? Diabetes mellitus without complication ([place])   ? Excessive or frequent menstruation 09/10/2012  ? Hyperlipidemia   ? Hypertension   ? NSTEMI (non-ST elevated myocardial infarction) ([place]) 10/30/2019  ? PONV (postoperative nausea and vomiting)   ? with ablation only  ? ? ?Past Surgical History:  ?Procedure  Laterality Date  ? CHOLECYSTECTOMY  2006  ? CORONARY ANGIOPLASTY    ? DILITATION & CURRETTAGE/HYSTROSCOPY WITH HYDROTHERMAL ABLATION N/A 07/10/2013  ? Procedure: DILATATION & CURETTAGE/HYSTEROSCOPY WITH HYDROTHERMAL ABLATION;  Surgeon: Shelly Bombard, MD;  Location: [facility];  Service: Gynecology;  Laterality: N/A;  ? LEFT HEART CATH AND CORONARY ANGIOGRAPHY N/A 11/02/2019  ?  Procedure: LEFT HEART CATH AND CORONARY ANGIOGRAPHY;  Surgeon: Sherren Mocha, MD;  Location: [facility];  Service: Cardiovascular;  Laterality: N/A;  ? TUBAL LIGATION  1997  ? [place] EXTRACTION  1998  ? ? ?Family History  ?Problem Relation Age of Onset  ? Breast cancer Mother   ? Hypertension Mother   ? Hypertension Father   ? Heart failure Maternal Aunt   ? Leukemia Paternal Uncle   ? COPD Other   ? Hypertension Other   ? Diabetes Other   ? Cancer Other   ? Colon cancer Neg Hx   ? Colon polyps Neg Hx   ? Esophageal cancer Neg Hx   ? Rectal cancer Neg Hx   ? Stomach cancer Neg Hx   ? ? ?Social History  ? ?Socioeconomic History  ? Marital status: Significant Other  ?  Spouse name: Jerald Kief  ? Number of children: 2  ? Years of education: Not on file  ? Highest education level: Some college, no degree  ?Occupational History  ? Occupation: Animal nutritionist   ?  Comment: [employer]  ?Tobacco Use  ? Smoking status: Never  ? Smokeless tobacco: Never  ?Vaping Use  ? Vaping Use: Never used  ?Substance and Sexual Activity  ? Alcohol use: No  ? Drug use: No  ? Sexual activity: Yes  ?  Partners: Male  ?  Birth control/protection: Surgical  ?Other Topics Concern  ? Not on file  ?Social History Narrative  ? Lives locally.  Does not routinely exercise.  In school @ [employer] for nsg.  ?   ?   ? Lives in a one story home   ? Right Handed   ? ?Social Determinants of Health  ? ?Financial Resource Strain: Not on file  ?Food Insecurity: Not on file  ?Transportation Needs: Not on file  ?Physical Activity: Not on file  ?Stress: Not on file  ?Social Connections: Not on file  ?Intimate Partner Violence: Not on file  ? ? ?Outpatient Medications Prior to Visit  ?Medication Sig Dispense Refill  ? aspirin 81 MG EC tablet Take 1 tablet (81 mg total) by mouth daily. 90 tablet 3  ? Blood Glucose Monitoring Suppl (TRUE METRIX METER) w/Device KIT Use to check blood sugars twice per day 1 kit 0  ? fexofenadine  (ALLEGRA) 180 MG tablet Take 180 mg by mouth daily.    ? fluticasone (FLONASE) 50 MCG/ACT nasal spray Place 2 sprays into both nostrils daily. 16 g 6  ? gabapentin (NEURONTIN) 300 MG capsule Take 1 capsule (300 mg total) by mouth at bedtime. 30 capsule 2  ? glucose blood (TRUE METRIX BLOOD GLUCOSE TEST) test strip Use as instructed to check BS twice per day 100 each 12  ? ibuprofen (ADVIL) 600 MG tablet Take 1 tablet (600 mg total) by mouth every 8 (eight) hours as needed. 90 tablet 1  ? Multiple Vitamin (MULTIVITAMIN WITH MINERALS) TABS tablet Take 1 tablet by mouth daily.    ? nitroGLYCERIN (NITROSTAT) 0.4 MG SL tablet Place 1 tablet (0.4 mg total) under the tongue every 5 (five) minutes x  3 doses as needed for chest pain. 25 tablet 11  ? nortriptyline (PAMELOR) 25 MG capsule Take 1 capsule (25 mg total) by mouth at bedtime. To prevent pain the next day (Patient taking differently: Take 25 mg by mouth at bedtime. To prevent pain the next day takes as needed) 30 capsule 2  ? TRUEplus Lancets 28G MISC Use when checking BS twice per day 100 each 11  ? atorvastatin (LIPITOR) 40 MG tablet TAKE 1 TABLET (40 MG TOTAL) BY MOUTH DAILY. 90 tablet 3  ? carvedilol (COREG) 25 MG tablet Take 1 tablet (25 mg total) by mouth 2 (two) times daily with a meal. 60 tablet 4  ? empagliflozin (JARDIANCE) 10 MG TABS tablet Take 1 tablet (10 mg total) by mouth daily. 30 tablet 3  ? metroNIDAZOLE (FLAGYL) 500 MG tablet Take 1 tablet (500 mg total) by mouth 2 (two) times daily. 14 tablet 0  ? valsartan-hydrochlorothiazide (DIOVAN-HCT) 80-12.5 MG tablet Take 1 tablet by mouth daily. 90 tablet 3  ? ?No facility-administered medications prior to visit.  ? ? ?Allergies  ?Allergen Reactions  ? Amlodipine Hives  ? Hydrocodone-Acetaminophen Hives and Itching  ?  And itching ?Itching and hives  ? Losartan Potassium-Hctz Other (See Comments)  ?  Dizziness  ? Tetanus Antitoxin   ?  Arm swelling size of a softball.   ? Tetanus Toxoids   ?  Arm  swelling size of a softball.   ? ? ?ROS ?Review of Systems  ?Constitutional:  Negative for fatigue and fever.  ?HENT:  Negative for congestion, hearing loss and sinus pressure.   ?Eyes:  Negative for itching and visua

## 2021-09-12 NOTE — Patient Instructions (Signed)
Increase valsartan HCT to 160/25 one daily ? ?No other changes ? ?Follow lifestyle medicine diet  ? ?Return to see Dr Joya Gaskins in 2 months ? ?See Lurena Joiner our clinical pharmacist in 3 weeks for your blood pressure and diabetes ? ? ?

## 2021-09-12 NOTE — Assessment & Plan Note (Signed)
As per hypertension assessment 

## 2021-09-13 ENCOUNTER — Ambulatory Visit: Payer: 59 | Admitting: Family Medicine

## 2021-09-13 LAB — BASIC METABOLIC PANEL
BUN/Creatinine Ratio: 10 (ref 9–23)
BUN: 8 mg/dL (ref 6–24)
CO2: 23 mmol/L (ref 20–29)
Calcium: 9.1 mg/dL (ref 8.7–10.2)
Chloride: 103 mmol/L (ref 96–106)
Creatinine, Ser: 0.78 mg/dL (ref 0.57–1.00)
Glucose: 162 mg/dL — ABNORMAL HIGH (ref 70–99)
Potassium: 3.8 mmol/L (ref 3.5–5.2)
Sodium: 140 mmol/L (ref 134–144)
eGFR: 94 mL/min/{1.73_m2} (ref >59)

## 2021-09-14 ENCOUNTER — Telehealth: Payer: Self-pay | Admitting: Pharmacist

## 2021-09-14 ENCOUNTER — Other Ambulatory Visit: Payer: Self-pay

## 2021-09-14 ENCOUNTER — Telehealth: Payer: Self-pay

## 2021-09-14 ENCOUNTER — Encounter: Payer: Self-pay | Admitting: *Deleted

## 2021-09-14 NOTE — Telephone Encounter (Signed)
Pt was called and vm was left, Information has been sent to nurse pool.   

## 2021-09-14 NOTE — Telephone Encounter (Signed)
-----   Message from Storm Frisk, MD sent at 09/13/2021  6:02 AM EDT ----- ?Let pt know labs all normal ?

## 2021-09-14 NOTE — Telephone Encounter (Signed)
Patient appearing on report for [employer] [facility] - Hypertension Control report due to last documented ambulatory blood pressure of 160/90 on 09/12/21. Next appointment with PCP is 11/14/21 - she is scheduled to see the embedded pharmacist on 10/06/21  ? ?Outreached patient to discuss hypertension control and medication management. Left voicemail for her to return my call at her convenience. ? ?Catie Eppie Gibson, PharmD, BCACP ?[facility] ?[phone] ? ? ?

## 2021-09-15 ENCOUNTER — Telehealth: Payer: Self-pay

## 2021-09-15 NOTE — Telephone Encounter (Signed)
Pt given lab results per notes of Dr. Delford Field on 09/15/21. Pt verbalized understanding. ? ?

## 2021-09-19 ENCOUNTER — Other Ambulatory Visit: Payer: Self-pay

## 2021-10-06 ENCOUNTER — Ambulatory Visit: Payer: BC Managed Care – PPO | Admitting: Pharmacist

## 2021-10-17 ENCOUNTER — Telehealth: Payer: Self-pay | Admitting: *Deleted

## 2021-10-17 NOTE — Telephone Encounter (Signed)
Pt reports received call from "Josh a pharmacy student, said he was concerned about a lab." States has been trying to CB and cannot get through. Questioning what this was about. Noted was to see Amy [name] 3 weeks after 09/12/21 blood work States she had to cancel appt.  Assured NT would route to practice for review. Advised to continue attempts to reach caller at number provided.

## 2021-10-17 NOTE — Telephone Encounter (Signed)
Hey Catie,  I see this patient next month for BP. It looks like there was a concern about lab results from 4/18. Labs were normal at that appointment with exception of elevated glucose. However, A1c shows good control. Do you know what the student was referring to?

## 2021-10-18 NOTE — Telephone Encounter (Signed)
Hey - he likely said something in the message about elevated blood pressure that could have been interpreted as a lab? Unsure. I'll plan to follow up this week. Thank you!

## 2021-10-19 ENCOUNTER — Telehealth: Payer: Self-pay | Admitting: Pharmacist

## 2021-10-19 NOTE — Telephone Encounter (Signed)
Attempted to call patient to discuss hypertension, as follow up from a call from our pharmacy student, Marlou Sa, on Monday. Unable to leave voicemail for patient.   Catie Eppie Gibson, PharmD, [facility] [phone]

## 2021-11-10 ENCOUNTER — Other Ambulatory Visit: Payer: Self-pay | Admitting: Pharmacist

## 2021-11-10 ENCOUNTER — Ambulatory Visit: Payer: BC Managed Care – PPO | Attending: Critical Care Medicine | Admitting: Pharmacist

## 2021-11-10 VITALS — BP 173/107 | HR 88

## 2021-11-10 DIAGNOSIS — I10 Essential (primary) hypertension: Secondary | ICD-10-CM

## 2021-11-10 NOTE — Progress Notes (Signed)
   S:    Amy [name] is a 46 y.o. female who presents for hypertension evaluation, education, and management. PMH is significant for HTN, CAD, and T2DM (A1c 6.6 on 09/12/2021). Patient was referred and last seen by Primary Care Provider, Dr. Delford Field, on 09/12/2021. At last visit, BP was 160/90 and valsartan dose was increased.   Today, patient arrives in good spirits and presents without assistance. Denies dizziness, headache, blurred vision, swelling. Medication adherence appropriate with no issues getting medications. Of note, patient has not taken BP medications today, as she didn't realize she was suppose to take medications before doctor appointments.   Current antihypertensives include:  Valsartan 160mg  / HCTZ 25mg  once daily  Carvedilol 25mg  twice daily   Antihypertensives tried in the past include:  Amlodipine (caused hives), losartan (dizziness)  Reported home BP readings: none reported  Patient reported dietary habits: trying to make "healthier choices" Patient-reported exercise habits: set goals to start exercising more and has a plan for   O:  Last 3 Office BP readings: BP Readings from Last 3 Encounters:  11/10/21 (!) 173/107  09/12/21 (!) 160/90  08/29/21 (!) 182/93  *not taken blood pressure medication today   BMET    Component Value Date/Time   NA 140 09/12/2021 1105   K 3.8 09/12/2021 1105   CL 103 09/12/2021 1105   CO2 23 09/12/2021 1105   GLUCOSE 162 (H) 09/12/2021 1105   GLUCOSE 165 (H) 11/02/2019 0651   BUN 8 09/12/2021 1105   CREATININE 0.78 09/12/2021 1105   CALCIUM 9.1 09/12/2021 1105   GFRNONAA 89 02/25/2020 1145   GFRAA 102 02/25/2020 1145    Renal function: CrCl cannot be calculated (Patient's most recent lab result is older than the maximum 21 days allowed.).  Clinical ASCVD: No  The ASCVD Risk score (Arnett DK, et al., 2019) failed to calculate for the following reasons:   The patient has a prior MI or stroke  diagnosis  A/P: Hypertension diagnosed currently above goal today, which the patient has not taken medications. No home blood pressures to assess efficacy of current medications. BP goal < 130/80 mmHg. Medication adherence appears optimal.  -Continue current BP medications  -Educated patient to take medication before any doctor appointments unless explicitly instructed to hold specific medications.  -Encouraged lifestyle modifications for blood pressure control including reduced dietary sodium, increased exercise, adequate sleep. -Encouraged patient to check BP at home and bring log of readings to next visit. Counseled on proper use of home BP cuff.   Results reviewed and written information provided. Patient verbalized understanding of treatment plan. Total time in face-to-face counseling 20 minutes.   F/u clinic visit on 11/14/21 with Dr. 02/27/2020.   Thank you for allowing pharmacy to participate in this patient's care.  2020, PharmD PGY1 Acute Care Resident  11/10/2021,12:52 PM

## 2021-11-14 ENCOUNTER — Encounter: Payer: Self-pay | Admitting: Critical Care Medicine

## 2021-11-14 ENCOUNTER — Ambulatory Visit: Payer: BC Managed Care – PPO | Attending: Critical Care Medicine | Admitting: Critical Care Medicine

## 2021-11-14 ENCOUNTER — Other Ambulatory Visit: Payer: Self-pay

## 2021-11-14 VITALS — BP 155/92 | HR 100 | Wt 222.4 lb

## 2021-11-14 DIAGNOSIS — G5601 Carpal tunnel syndrome, right upper limb: Secondary | ICD-10-CM

## 2021-11-14 DIAGNOSIS — E119 Type 2 diabetes mellitus without complications: Secondary | ICD-10-CM | POA: Diagnosis not present

## 2021-11-14 DIAGNOSIS — I10 Essential (primary) hypertension: Secondary | ICD-10-CM

## 2021-11-14 DIAGNOSIS — Z6841 Body Mass Index (BMI) 40.0 and over, adult: Secondary | ICD-10-CM

## 2021-11-14 MED ORDER — VALSARTAN-HYDROCHLOROTHIAZIDE 320-25 MG PO TABS
1.0000 | ORAL_TABLET | Freq: Every day | ORAL | 3 refills | Status: DC
  Filled 2021-11-14 – 2021-11-23 (×2): qty 30, 30d supply, fill #0

## 2021-11-14 NOTE — Assessment & Plan Note (Addendum)
Hypertension 155/92. Increased dose of valsartan-HCTZ to 320/25 daily Counseled patient on limitation of added salt in diet. Will closely monitor and reevaluate at next visit Continue carvedilol twice daily.

## 2021-11-14 NOTE — Assessment & Plan Note (Signed)
The following Lifestyle Medicine recommendations according to American College of Lifestyle Medicine (ACLM) were discussed and offered to patient who agrees to start the journey:  A. Whole Foods, Plant-based plate comprising of fruits and vegetables, plant-based proteins, whole-grain carbohydrates was discussed in detail with the patient.   A list for source of those nutrients were also provided to the patient.  Patient will use only water or unsweetened tea for hydration. B.  The need to stay away from risky substances including alcohol, smoking; obtaining 7 to 9 hours of restorative sleep, at least 150 minutes of moderate intensity exercise weekly, the importance of healthy social connections,  and stress reduction techniques were discussed. 

## 2021-11-14 NOTE — Assessment & Plan Note (Signed)
Symptoms from this have resolved

## 2021-11-14 NOTE — Patient Instructions (Addendum)
Continue to reduce salt in food  Increase valsartan HCT to 320/25 one daily  pick up this week new dose  No other changes  Lab urine for protein in urine check  Return Dr Delford Field 1 month

## 2021-11-14 NOTE — Progress Notes (Deleted)
Established Patient Office Visit  Subjective:  Patient ID: Amy [name], female    DOB: Oct 11, 1975  Age: 46 y.o. MRN: [id number]  CC:  No chief complaint on file.   HPI 06/14/21 Amy [name] presents for primary care follow up for hypertension and Type 2 Diabetes. At her last visit, her medications for hypertension were changed due to side effects. She is now taking valsartan-HCTZ 80-12.5 mg and carvedilol 25 mg twice daily. She says this combination is working very well for her and she has no side effects. Her in office readings were 147/102 and 144/78. She says this is much higher than her home readings. She takes home blood pressures, and states that they typically are in the 120s/80s. This morning's reading was 126/70.   She states she has been taking her Jardiance for her diabetes as directed. She reports no symptoms from her diabetes. She takes her blood sugars once daily, typically in the morning. This typically measures between 120-130 mg/dL. She will occasionally do a second reading in the evening.   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 She says she now is able to focus on her health much more. She says her sleep has improved drastically. She has started engaging in more aggressive lifestyle management with her diet. She says she is eating more plant-based foods. She tries to eat salads with a variety of greens and fruit for lunch. She doesn't enjoy eating meat much, but she is eating salmon and other fish. She has found that by making these changes, she has started craving nutrient-dense foods over processed foods. She says her husband is very supportive of this effort and is also working on improving his diet, so she feels much more committed to keeping this lifestyle change up. She has not started exercising yet, but is interested in walking.   She has seen physical medicine and  rehabilitation, neurology, and occupational therapy for persistent pain in her wrist that began after her cardiac catheterization. She had nerve conduction testing that was negative for abnormalities. She is planning on having some imaging that was ordered by orthopedics for targeted injections. She has started nortriptyline for the pain and this has helped. She doesn't think the occupational therapy is very helpful.   Her only complaint today is some sinus congestion and pressure. She believes this is due to seasonal allergies. She typically takes Allegra/fexofenadine daily for allergies.  She is due for colon cancer screening. She has scheduled this for February 6 with GI.   09/12/2021 This patient is seen in return follow-up for diabetes hypertension obesity.  On arrival blood pressure elevated 160/90 blood sugar 185 A1c is 6.6.  She states she has lower blood sugars at home she just had a heavy carbohydrate breakfast.  Patient has no real complaints at this visit.  She states her diet is intermittently not healthy.  She is not exercising like she should.  She has been compliant with her blood pressure medications.  6/20  Cardiovascular and Mediastinum   Essential hypertension    Not at goal plan to increase valsartan HCT to 160/25 continue carvedilol as prescribed  Patient given a lifestyle medicine handout to follow and focus on a diet and exercise program she will return in short-term follow-up       Relevant Medications   atorvastatin (LIPITOR) 40 MG tablet   carvedilol (COREG) 25 MG tablet   valsartan-hydrochlorothiazide (DIOVAN-HCT) 160-25  MG tablet   Coronary artery disease due to type 2 diabetes mellitus ([place])    As per hypertension assessment       Relevant Medications   atorvastatin (LIPITOR) 40 MG tablet   carvedilol (COREG) 25 MG tablet   empagliflozin (JARDIANCE) 10 MG TABS tablet   valsartan-hydrochlorothiazide (DIOVAN-HCT) 160-25 MG tablet     Endocrine   Type 2  diabetes mellitus without complication, without long-term current use of insulin (HCC) - Primary    A1c 6.6 at goal continue Jardiance along Patient given a lifestyle medicine handout to follow on proper diet Need to assess renal function       Relevant Medications   atorvastatin (LIPITOR) 40 MG tablet   empagliflozin (JARDIANCE) 10 MG TABS tablet   valsartan-hydrochlorothiazide (DIOVAN-HCT) 160-25 MG tablet   Other Relevant Orders   POCT glucose (manual entry) (Completed)   POCT glycosylated hemoglobin (Hb A1C) (Completed)   Basic Metabolic Panel     Other   Class 3 severe obesity due to excess calories with serious comorbidity and body mass index (BMI) of 40.0 to 44.9 in adult [facility])    As per diabetes and hypertension assessment       Relevant Medications   empagliflozin (JARDIANCE) 10 MG TABS tablet   Past Medical History:  Diagnosis Date  . Anemia   . Coronary artery disease   . Diabetes mellitus without complication ([place])   . Excessive or frequent menstruation 09/10/2012  . Hyperlipidemia   . Hypertension   . NSTEMI (non-ST elevated myocardial infarction) ([place]) 10/30/2019  . PONV (postoperative nausea and vomiting)    with ablation only    Past Surgical History:  Procedure Laterality Date  . CHOLECYSTECTOMY  2006  . CORONARY ANGIOPLASTY    . DILITATION & CURRETTAGE/HYSTROSCOPY WITH HYDROTHERMAL ABLATION N/A 07/10/2013   Procedure: DILATATION & CURETTAGE/HYSTEROSCOPY WITH HYDROTHERMAL ABLATION;  Surgeon: Shelly Bombard, MD;  Location: [place] [facility];  Service: Gynecology;  Laterality: N/A;  . LEFT HEART CATH AND CORONARY ANGIOGRAPHY N/A 11/02/2019   Procedure: LEFT HEART CATH AND CORONARY ANGIOGRAPHY;  Surgeon: Sherren Mocha, MD;  Location: [place] [facility];  Service: Cardiovascular;  Laterality: N/A;  . TUBAL LIGATION  1997  . WISDOM TOOTH EXTRACTION  1998    Family History  Problem Relation Age of Onset  . Breast cancer Mother   . Hypertension Mother   .  Hypertension Father   . Heart failure Maternal Aunt   . Leukemia Paternal Uncle   . COPD Other   . Hypertension Other   . Diabetes Other   . Cancer Other   . Colon cancer Neg Hx   . Colon polyps Neg Hx   . Esophageal cancer Neg Hx   . Rectal cancer Neg Hx   . Stomach cancer Neg Hx     Social History   Socioeconomic History  . Marital status: Significant Other    Spouse name: McCarol Jones  . Number of children: 2  . Years of education: Not on file  . Highest education level: Some college, no degree  Occupational History  . 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 Smoking status: Never  . Smokeless tobacco: Never  Vaping Use  . Vaping Use: Never used  Substance and Sexual Activity  . Alcohol use: No  . Drug use: No  . Sexual activity: Yes    Partners: Male    Birth control/protection: Surgical  Other Topics Concern  .  Not on file  Social History Narrative   Lives locally.  Does not routinely exercise.  In school @ [employer] for nsg.         Lives in a one story home    Right Handed    Social Determinants of Health   Financial Resource Strain: Not on file  Food Insecurity: Not on file  Transportation Needs: No Transportation Needs (08/07/2018)   PRAPARE - Transportation   . Lack of Transportation (Medical): No   . Lack of Transportation (Non-Medical): No  Physical Activity: Not on file  Stress: Not on file  Social Connections: Not on file  Intimate Partner Violence: Not on file    Outpatient Medications Prior to Visit  Medication Sig Dispense Refill  . aspirin 81 MG EC tablet Take 1 tablet (81 mg total) by mouth daily. 90 tablet 3  . atorvastatin (LIPITOR) 40 MG tablet TAKE 1 TABLET (40 MG TOTAL) BY MOUTH DAILY. 90 tablet 3  . carvedilol (COREG) 25 MG tablet Take 1 tablet (25 mg total) by mouth 2 (two) times daily with a meal. 60 tablet 4  . empagliflozin (JARDIANCE) 10 MG TABS tablet Take 1 tablet (10 mg total) by  mouth daily. 30 tablet 3  . fexofenadine (ALLEGRA) 180 MG tablet Take 180 mg by mouth daily.    . fluticasone (FLONASE) 50 MCG/ACT nasal spray Place 2 sprays into both nostrils daily. 16 g 6  . Multiple Vitamin (MULTIVITAMIN WITH MINERALS) TABS tablet Take 1 tablet by mouth daily.    . nitroGLYCERIN (NITROSTAT) 0.4 MG SL tablet Place 1 tablet (0.4 mg total) under the tongue every 5 (five) minutes x 3 doses as needed for chest pain. 25 tablet 11  . nortriptyline (PAMELOR) 25 MG capsule Take 1 capsule (25 mg total) by mouth at bedtime. To prevent pain the next day (Patient taking differently: Take 25 mg by mouth at bedtime. To prevent pain the next day takes as needed) 30 capsule 2  . valsartan-hydrochlorothiazide (DIOVAN-HCT) 160-25 MG tablet Take 1 tablet by mouth daily. 90 tablet 3   No facility-administered medications prior to visit.    Allergies  Allergen Reactions  . Amlodipine Hives  . Hydrocodone-Acetaminophen Hives and Itching    And itching Itching and hives  . Losartan Potassium-Hctz Other (See Comments)    Dizziness  . Tetanus Antitoxin     Arm swelling size of a softball.   . Tetanus Toxoids     Arm swelling size of a softball.     ROS Review of Systems  Constitutional:  Negative for fatigue and fever.  HENT:  Negative for congestion, hearing loss and sinus pressure.   Eyes:  Negative for itching and visual disturbance.  Respiratory:  Negative for cough and shortness of breath.   Cardiovascular:  Negative for chest pain and palpitations.  Gastrointestinal:  Negative for diarrhea, nausea and vomiting.  Genitourinary:  Negative for dysuria and frequency.  Musculoskeletal:  Negative for back pain.  Skin:  Negative for rash.  Neurological:  Negative for headaches.  Psychiatric/Behavioral:  Negative for dysphoric mood and sleep disturbance. The patient is not nervous/anxious.       Objective:    Physical Exam Vitals reviewed.  Constitutional:      Appearance:  Normal appearance. She is well-developed. She is obese. She is not diaphoretic.  HENT:     Head: Normocephalic and atraumatic.     Nose: Nose normal. No nasal deformity, septal deviation, mucosal edema, congestion or rhinorrhea.  Right Sinus: No maxillary sinus tenderness or frontal sinus tenderness.     Left Sinus: No maxillary sinus tenderness or frontal sinus tenderness.     Mouth/Throat:     Mouth: Mucous membranes are moist.     Pharynx: No oropharyngeal exudate.  Eyes:     General: No scleral icterus.    Conjunctiva/sclera: Conjunctivae normal.     Pupils: Pupils are equal, round, and reactive to light.  Neck:     Thyroid: No thyromegaly.     Vascular: No carotid bruit or JVD.     Trachea: Trachea normal. No tracheal tenderness or tracheal deviation.  Cardiovascular:     Rate and Rhythm: Normal rate and regular rhythm.     Chest Wall: PMI is not displaced.     Pulses: Normal pulses. No decreased pulses.     Heart sounds: Normal heart sounds, S1 normal and S2 normal. Heart sounds not distant. No murmur heard.    No systolic murmur is present.     No diastolic murmur is present.     No friction rub. No gallop. No S3 or S4 sounds.  Pulmonary:     Effort: Pulmonary effort is normal. No tachypnea, accessory muscle usage or respiratory distress.     Breath sounds: Normal breath sounds. No stridor. No decreased breath sounds, wheezing, rhonchi or rales.  Chest:     Chest wall: No tenderness.  Abdominal:     General: Bowel sounds are normal. There is no distension.     Palpations: Abdomen is soft. Abdomen is not rigid.     Tenderness: There is no abdominal tenderness. There is no guarding or rebound.  Musculoskeletal:        General: Normal range of motion.     Cervical back: Normal range of motion and neck supple. No edema, erythema or rigidity. No muscular tenderness. Normal range of motion.  Lymphadenopathy:     Head:     Right side of head: No submental or submandibular  adenopathy.     Left side of head: No submental or submandibular adenopathy.     Cervical: No cervical adenopathy.  Skin:    General: Skin is warm and dry.     Coloration: Skin is not pale.     Findings: No rash.     Nails: There is no clubbing.  Neurological:     General: No focal deficit present.     Mental Status: She is alert and oriented to person, place, and time.     Sensory: No sensory deficit.  Psychiatric:        Mood and Affect: Mood normal.        Speech: Speech normal.        Behavior: Behavior normal.        Thought Content: Thought content normal.        Judgment: Judgment normal.    There were no vitals taken for this visit. Wt Readings from Last 3 Encounters:  09/12/21 221 lb 12.8 oz (100.6 kg)  08/29/21 216 lb 12.8 oz (98.3 kg)  07/13/21 212 lb 4 oz (96.3 kg)     Health Maintenance Due  Topic Date Due  . COVID-19 Vaccine (2 - [place] risk series) 03/02/2020    There are no preventive care reminders to display for this patient.  No results found for: "TSH" Lab Results  Component Value Date   WBC 7.6 04/12/2021   HGB 11.3 04/12/2021   HCT 34.6 04/12/2021   MCV 85 04/12/2021  PLT 324 04/12/2021   Lab Results  Component Value Date   NA 140 09/12/2021   K 3.8 09/12/2021   CO2 23 09/12/2021   GLUCOSE 162 (H) 09/12/2021   BUN 8 09/12/2021   CREATININE 0.78 09/12/2021   BILITOT 0.2 04/12/2021   ALKPHOS 91 04/12/2021   AST 15 04/12/2021   ALT 8 04/12/2021   PROT 7.9 04/12/2021   ALBUMIN 4.0 04/12/2021   CALCIUM 9.1 09/12/2021   ANIONGAP 10 11/02/2019   EGFR 94 09/12/2021   Lab Results  Component Value Date   CHOL 168 10/19/2020   Lab Results  Component Value Date   HDL 44 10/19/2020   Lab Results  Component Value Date   LDLCALC 112 (H) 10/19/2020   Lab Results  Component Value Date   TRIG 63 10/19/2020   Lab Results  Component Value Date   CHOLHDL 3.8 10/19/2020   Lab Results  Component Value Date   HGBA1C 6.6 09/12/2021       Assessment & Plan:   Problem List Items Addressed This Visit   None No orders of the defined types were placed in this encounter. 38 minutes spent with patient education complex decision making modification of medications due to elevated blood pressure  Follow-up: No follow-ups on file.    Asencion Noble, MD

## 2021-11-14 NOTE — Assessment & Plan Note (Addendum)
Hemoglobin A1C 6.6 (08/2021). Well controlled on current medications. No changes today.   Counseled her on necessary dietary and physical activity changes. Will continue to monitor.  Urine obtained today.  For microalbumin assessment

## 2021-11-14 NOTE — Progress Notes (Signed)
Established Patient Office Visit  Subjective   Patient ID: Amy [name], female    DOB: [birth year]  Age: 46 y.o. MRN: [id number]  Cc primary care follow-up hypertension  Amy [name] is a well appearing 46 year old female with a history of coronary artery disease, diabetes, and hypertension presents for follow up. No acute concerns today. Hemoglobin A1C 6.6 (08/2021). She states she checks her sugars twice a day and they usually run between 120-130. Adherent to her medications. Patient states she is doing her best to increase her activity level by brisk walking but struggles to stay disciplined due to the demand of her work. She is being proactive with her diet and is incorporating more vegetables and fresh fruits daily. Denies alcohol and substance abuse.  Blood pressure today 155/92. Last few office visits with other providers have also been elevated. In further questioning her about her diet we discussed salt intake. She admits to not monitoring her intake and often times will use sauces or dressings that she believes maybe increasing her blood pressure.      Patient Active Problem List   Diagnosis Date Noted   Pronator syndrome, right 07/11/2021   Seasonal allergic rhinitis due to pollen 06/14/2021   Essential hypertension 01/04/2020   Type 2 diabetes mellitus without complication, without long-term current use of insulin ([place]) 01/04/2020   Class 3 severe obesity due to excess calories with serious comorbidity and body mass index (BMI) of 40.0 to 44.9 in adult [facility]) 01/04/2020   Coronary artery disease due to type 2 diabetes mellitus ([place]) 01/04/2020   Dental caries 01/04/2020   Past Medical History:  Diagnosis Date   Anemia    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   Coronary artery disease    Diabetes mellitus without complication (HCC)    Excessive or frequent menstruation 09/10/2012   Hyperlipidemia    Hypertension    NSTEMI (non-ST elevated  myocardial infarction) ([place]) 10/30/2019   PONV (postoperative nausea and vomiting)    with ablation only   Past Surgical History:  Procedure Laterality Date   CHOLECYSTECTOMY  2006   CORONARY ANGIOPLASTY     DILITATION & CURRETTAGE/HYSTROSCOPY WITH HYDROTHERMAL ABLATION N/A 07/10/2013   Procedure: DILATATION & CURETTAGE/HYSTEROSCOPY WITH HYDROTHERMAL ABLATION;  Surgeon: Shelly Bombard, MD;  Location: [place] [facility];  Service: Gynecology;  Laterality: N/A;   LEFT HEART CATH AND CORONARY ANGIOGRAPHY N/A 11/02/2019   Procedure: LEFT HEART CATH AND CORONARY ANGIOGRAPHY;  Surgeon: Sherren Mocha, MD;  Location: [place] [facility];  Service: Cardiovascular;  Laterality: N/A;   TUBAL LIGATION  1997   WISDOM TOOTH EXTRACTION  1998   Social History   Tobacco Use   Smoking status: Never   Smokeless tobacco: Never  Vaping Use   Vaping Use: Never used  Substance Use Topics   Alcohol use: No   Drug use: No   Social History   Socioeconomic History   Marital status: Significant Other    Spouse name: McCarol Jones   Number of children: 2   Years of education: Not on file   Highest education level: Some college, no degree  Occupational History   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  Does not routinely exercise.  In school @ [employer] for nsg.         Lives in a one story home    Right Handed    Social Determinants of Health   Financial Resource Strain: Not on file  Food Insecurity: Not on file  Transportation Needs: No Transportation Needs (08/07/2018)   PRAPARE - Hydrologist (Medical): No    Lack of  Transportation (Non-Medical): No  Physical Activity: Not on file  Stress: Not on file  Social Connections: Not on file  Intimate Partner Violence: Not on file   Family Status  Relation Name Status   Mother  Alive   Father  Alive   Mat Aunt  (Not Specified)   Annamarie Major  (Not Specified)   Other  (Not Specified)   Other  (Not Specified)   Other  (Not Specified)   Other  (Not Specified)   Neg Hx  (Not Specified)   Family History  Problem Relation Age of Onset   Breast cancer Mother    Hypertension Mother    Hypertension Father    Heart failure Maternal Aunt    Leukemia Paternal Uncle    COPD Other    Hypertension Other    Diabetes Other    Cancer Other    Colon cancer Neg Hx    Colon polyps Neg Hx    Esophageal cancer Neg Hx    Rectal cancer Neg Hx    Stomach cancer Neg Hx    Allergies  Allergen Reactions   Amlodipine Hives   Hydrocodone-Acetaminophen Hives and Itching    And itching Itching and hives   Losartan Potassium-Hctz Other (See Comments)    Dizziness   Tetanus Antitoxin     Arm swelling size of a softball.    Tetanus Toxoids     Arm swelling size of a softball.     Review of Systems  Constitutional: Negative.   HENT: Negative.    Eyes: Negative.   Respiratory: Negative.    Cardiovascular: Negative.   Gastrointestinal: Negative.   Genitourinary: Negative.   Musculoskeletal: Negative.   Skin: Negative.   Neurological: Negative.   Endo/Heme/Allergies: Negative.       Objective:     BP (!) 155/92   Pulse 100   Wt 222 lb 6.4 oz (100.9 kg)   SpO2 93%   BMI 40.68 kg/m  BP Readings from Last 3 Encounters:  11/14/21 (!) 155/92  11/10/21 (!) 173/107  09/12/21 (!) 160/90   Wt Readings from Last 3 Encounters:  11/14/21 222 lb 6.4 oz (100.9 kg)  09/12/21 221 lb 12.8 oz (100.6 kg)  08/29/21 216 lb 12.8 oz (98.3 kg)      Physical Exam Constitutional:      Appearance: Normal appearance. She is obese.  HENT:     Right Ear: Tympanic  membrane normal.     Left Ear: Tympanic membrane normal.     Nose: Rhinorrhea present.     Mouth/Throat:     Comments: Refused oral physical exam Cardiovascular:     Rate and Rhythm: Normal rate and regular rhythm.     Pulses: Normal pulses.     Heart sounds: Normal heart sounds.  Pulmonary:     Effort: Pulmonary effort is normal.     Breath sounds: Normal breath sounds.  Abdominal:  General: Abdomen is flat. Bowel sounds are normal.     Palpations: Abdomen is soft.  Skin:    General: Skin is warm and dry.  Neurological:     Mental Status: She is alert.     Motor: No pronator drift.  Psychiatric:        Mood and Affect: Mood normal.        Behavior: Behavior normal.      No results found for any visits on 11/14/21.  Last CBC Lab Results  Component Value Date   WBC 7.6 04/12/2021   HGB 11.3 04/12/2021   HCT 34.6 04/12/2021   MCV 85 04/12/2021   MCH 27.8 04/12/2021   RDW 13.0 04/12/2021   PLT 324 27/78/2423   Last metabolic panel Lab Results  Component Value Date   GLUCOSE 162 (H) 09/12/2021   NA 140 09/12/2021   K 3.8 09/12/2021   CL 103 09/12/2021   CO2 23 09/12/2021   BUN 8 09/12/2021   CREATININE 0.78 09/12/2021   EGFR 94 09/12/2021   CALCIUM 9.1 09/12/2021   PROT 7.9 04/12/2021   ALBUMIN 4.0 04/12/2021   LABGLOB 3.9 04/12/2021   AGRATIO 1.0 (L) 04/12/2021   BILITOT 0.2 04/12/2021   ALKPHOS 91 04/12/2021   AST 15 04/12/2021   ALT 8 04/12/2021   ANIONGAP 10 11/02/2019   Last lipids Lab Results  Component Value Date   CHOL 168 10/19/2020   HDL 44 10/19/2020   LDLCALC 112 (H) 10/19/2020   TRIG 63 10/19/2020   CHOLHDL 3.8 10/19/2020   Last hemoglobin A1c Lab Results  Component Value Date   HGBA1C 6.6 09/12/2021   Last thyroid functions No results found for: "TSH", "T3TOTAL", "T4TOTAL", "THYROIDAB" Last vitamin D No results found for: "25OHVITD2", "25OHVITD3", "VD25OH" Last vitamin B12 and Folate No results found for: "VITAMINB12",  "FOLATE"  The ASCVD Risk score (Arnett DK, et al., 2019) failed to calculate for the following reasons:   The patient has a prior MI or stroke diagnosis    Assessment & Plan:   Problem List Items Addressed This Visit       Cardiovascular and Mediastinum   Essential hypertension    Hypertension 155/92. Increased dose of valsartan-HCTZ to 320/25 daily Counseled patient on limitation of added salt in diet. Will closely monitor and reevaluate at next visit Continue carvedilol twice daily.      Relevant Medications   valsartan-hydrochlorothiazide (DIOVAN-HCT) 320-25 MG tablet     Endocrine   Type 2 diabetes mellitus without complication, without long-term current use of insulin (HCC) - Primary    Hemoglobin A1C 6.6 (08/2021). Well controlled on current medications. No changes today.   Counseled her on necessary dietary and physical activity changes. Will continue to monitor.  Urine obtained today.  For microalbumin assessment      Relevant Medications   valsartan-hydrochlorothiazide (DIOVAN-HCT) 320-25 MG tablet   Other Relevant Orders   Urine microalbumin-creatinine with uACR     Nervous and Auditory   RESOLVED: Carpal tunnel syndrome of right wrist    Symptoms from this have resolved        Other   Class 3 severe obesity due to excess calories with serious comorbidity and body mass index (BMI) of 40.0 to 44.9 in adult [facility])    The following Lifestyle Medicine recommendations according to [place] of Lifestyle Medicine [facility]) were discussed and offered to patient who agrees to start the journey:  A. Whole Foods, Plant-based plate comprising of fruits and vegetables,  plant-based proteins, whole-grain carbohydrates was discussed in detail with the patient.   A list for source of those nutrients were also provided to the patient.  Patient will use only water or unsweetened tea for hydration. B.  The need to stay away from risky substances including alcohol, smoking;  obtaining 7 to 9 hours of restorative sleep, at least 150 minutes of moderate intensity exercise weekly, the importance of healthy social connections,  and stress reduction techniques were discussed.        Return in about 1 month (around 12/14/2021).    Asencion Noble, MD

## 2021-11-15 LAB — MICROALBUMIN / CREATININE URINE RATIO
Creatinine, Urine: 112.6 mg/dL
Microalb/Creat Ratio: 25 mg/g creat (ref 0–29)
Microalbumin, Urine: 27.8 ug/mL

## 2021-11-15 NOTE — Progress Notes (Signed)
Let pt know urine protein is normal /negative

## 2021-11-16 ENCOUNTER — Telehealth: Payer: Self-pay

## 2021-11-16 NOTE — Telephone Encounter (Signed)
-----   Message from Storm Frisk, MD sent at 11/15/2021  2:48 PM EDT ----- Let pt know urine protein is normal [name]

## 2021-11-16 NOTE — Telephone Encounter (Signed)
Pt was called and is aware of results, DOB was confirmed.  ?

## 2021-11-21 ENCOUNTER — Other Ambulatory Visit: Payer: Self-pay

## 2021-11-23 ENCOUNTER — Other Ambulatory Visit: Payer: Self-pay

## 2021-11-24 ENCOUNTER — Other Ambulatory Visit: Payer: Self-pay

## 2021-12-17 NOTE — Progress Notes (Signed)
Established Patient Office Visit  Subjective   Patient ID: [name], female    DOB: February 20, 1976  Age: 46 y.o. MRN: [id number]  Cc primary care follow-up hypertension  11/14/21 [name] is a well appearing 46 year old female with a history of coronary artery disease, diabetes, and hypertension presents for follow up. No acute concerns today. Hemoglobin A1C 6.6 (08/2021). She states she checks her sugars twice a day and they usually run between 120-130. Adherent to her medications. Patient states she is doing her best to increase her activity level by brisk walking but struggles to stay disciplined due to the demand of her work. She is being proactive with her diet and is incorporating more vegetables and fresh fruits daily. Denies alcohol and substance abuse.  Blood pressure today 155/92. Last few office visits with other providers have also been elevated. In further questioning her about her diet we discussed salt intake. She admits to not monitoring her intake and often times will use sauces or dressings that she believes maybe increasing her blood pressure.   7/24 Patient is here for short-term follow-up on arrival blood pressure quite elevated 160/100 but she states at home it runs in the 142/95 range.  Blood sugar though is much improved 126 and she has lost some weight.  Patient is on the valsartan HCT and carvedilol.  A1c at the last visit was 6.6 and we continued with Jardiance daily.  Today the A1c is at 7.5  The patient also received a lifestyle medicine handout has been trying to change her diet and eat healthier food choices.       Patient Active Problem List   Diagnosis Date Noted   Pronator syndrome, right 07/11/2021   Seasonal allergic rhinitis due to pollen 06/14/2021   Essential hypertension 01/04/2020   Type 2 diabetes mellitus without complication, without long-term current use of insulin ([name]) 01/04/2020   Class 3 severe obesity due to excess  calories with serious comorbidity and body mass index (BMI) of 40.0 to 44.9 in adult [name]) 01/04/2020   Coronary artery disease due to type 2 diabetes mellitus ([name]) 01/04/2020   Dental caries 01/04/2020   Past Medical History:  Diagnosis Date   Anemia    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   Coronary artery disease    Diabetes mellitus without complication (HCC)    Excessive or frequent menstruation 09/10/2012   Hyperlipidemia    Hypertension    NSTEMI (non-ST elevated myocardial infarction) ([name]) 10/30/2019   PONV (postoperative nausea and vomiting)    with ablation only   Past Surgical History:  Procedure Laterality Date   CHOLECYSTECTOMY  2006   CORONARY ANGIOPLASTY     DILITATION & CURRETTAGE/HYSTROSCOPY WITH HYDROTHERMAL ABLATION [name] 07/10/2013   Procedure: DILATATION & CURETTAGE/HYSTEROSCOPY WITH HYDROTHERMAL ABLATION;  Surgeon: Amy Bombard, MD;  Location: [name];  Service: Gynecology;  Laterality: [name];   LEFT HEART CATH AND CORONARY ANGIOGRAPHY [name] 11/02/2019   Procedure: LEFT HEART CATH AND CORONARY ANGIOGRAPHY;  Surgeon: Amy Mocha, MD;  Location: [name];  Service: Cardiovascular;  Laterality: [name];   TUBAL LIGATION  1997   WISDOM TOOTH EXTRACTION  1998   Social History   Tobacco Use   Smoking status: Never   Smokeless tobacco: Never  Vaping Use   Vaping Use: Never used  Substance Use Topics   Alcohol use: No   Drug use: No   Social History   Socioeconomic History   Marital status: Significant  Other    Spouse name: [name]   Number of children: 2   Years of education: Not on file   Highest education level: Some college, no degree  Occupational History   Occupation: Animal nutritionist     Comment: [name]  Tobacco Use   Smoking status: Never   Smokeless tobacco: Never  Vaping Use   Vaping Use: Never used  Substance and Sexual Activity   Alcohol use: No   Drug use: No   Sexual activity: Yes     Partners: Male    Birth control/protection: Surgical  Other Topics Concern   Not on file  Social History Narrative   Lives locally.  Does not routinely exercise.  In school @ [name].         Lives in a one story home    Right Handed    Social Determinants of Health   Financial Resource Strain: Not on file  Food Insecurity: Not on file  Transportation Needs: No Transportation Needs (08/07/2018)   PRAPARE - Hydrologist (Medical): No    Lack of Transportation (Non-Medical): No  Physical Activity: Not on file  Stress: Not on file  Social Connections: Not on file  Intimate Partner Violence: Not on file   Family Status  Relation Name Status   Mother  Alive   Father  Alive   Mat Aunt  (Not Specified)   [name]  (Not Specified)   Other  (Not Specified)   Other  (Not Specified)   Other  (Not Specified)   Other  (Not Specified)   Neg Hx  (Not Specified)   Family History  Problem Relation Age of Onset   Breast cancer Mother    Hypertension Mother    Hypertension Father    Heart failure Maternal Aunt    Leukemia Paternal Uncle    COPD Other    Hypertension Other    Diabetes Other    Cancer Other    Colon cancer Neg Hx    Colon polyps Neg Hx    Esophageal cancer Neg Hx    Rectal cancer Neg Hx    Stomach cancer Neg Hx    Allergies  Allergen Reactions   Amlodipine Hives   Hydrocodone-Acetaminophen Hives and Itching    And itching Itching and hives   Losartan Potassium-Hctz Other (See Comments)    Dizziness   Tetanus Antitoxin     Arm swelling size of a softball.    Tetanus Toxoids     Arm swelling size of a softball.     Review of Systems  Constitutional: Negative.  Negative for chills, diaphoresis, fever, malaise/fatigue and weight loss.  HENT: Negative.  Negative for congestion, hearing loss, nosebleeds, sore throat and tinnitus.   Eyes: Negative.  Negative for blurred vision, photophobia and redness.  Respiratory:  Negative.  Negative for cough, hemoptysis, sputum production, shortness of breath, wheezing and stridor.   Cardiovascular: Negative.  Negative for chest pain, palpitations, orthopnea, claudication, leg swelling and PND.  Gastrointestinal: Negative.  Negative for abdominal pain, blood in stool, constipation, diarrhea, heartburn, nausea and vomiting.  Genitourinary: Negative.  Negative for dysuria, flank pain, frequency, hematuria and urgency.  Musculoskeletal: Negative.  Negative for back pain, falls, joint pain, myalgias and neck pain.  Skin: Negative.  Negative for itching and rash.  Neurological: Negative.  Negative for dizziness, tingling, tremors, sensory change, speech change, focal weakness, seizures, loss of consciousness, weakness and headaches.  Endo/Heme/Allergies:  Negative.  Negative for environmental allergies and polydipsia. Does not bruise/bleed easily.  Psychiatric/Behavioral:  Negative for depression, memory loss, substance abuse and suicidal ideas. The patient is not nervous/anxious and does not have insomnia.       Objective:     BP (!) 160/100   Pulse 66   Wt 223 lb (101.2 kg)   SpO2 98%   BMI 40.79 kg/m  BP Readings from Last 3 Encounters:  12/18/21 (!) 160/100  11/14/21 (!) 155/92  11/10/21 (!) 173/107   Wt Readings from Last 3 Encounters:  12/18/21 223 lb (101.2 kg)  11/14/21 222 lb 6.4 oz (100.9 kg)  09/12/21 221 lb 12.8 oz (100.6 kg)      Physical Exam Vitals reviewed.  Constitutional:      Appearance: Normal appearance. She is well-developed. She is obese. She is not diaphoretic.  HENT:     Head: Normocephalic and atraumatic.     Right Ear: Tympanic membrane normal.     Left Ear: Tympanic membrane normal.     Nose: Nose normal. No nasal deformity, septal deviation, mucosal edema or rhinorrhea.     Right Sinus: No maxillary sinus tenderness or frontal sinus tenderness.     Left Sinus: No maxillary sinus tenderness or frontal sinus tenderness.      Mouth/Throat:     Pharynx: No oropharyngeal exudate.     Comments: Refused oral physical exam Eyes:     General: No scleral icterus.    Conjunctiva/sclera: Conjunctivae normal.     Pupils: Pupils are equal, round, and reactive to light.  Neck:     Thyroid: No thyromegaly.     Vascular: No carotid bruit or JVD.     Trachea: Trachea normal. No tracheal tenderness or tracheal deviation.  Cardiovascular:     Rate and Rhythm: Normal rate and regular rhythm.     Chest Wall: PMI is not displaced.     Pulses: Normal pulses. No decreased pulses.     Heart sounds: Normal heart sounds, S1 normal and S2 normal. Heart sounds not distant. No murmur heard.    No systolic murmur is present.     No diastolic murmur is present.     No friction rub. No gallop. No S3 or S4 sounds.  Pulmonary:     Effort: Pulmonary effort is normal. No tachypnea, accessory muscle usage or respiratory distress.     Breath sounds: Normal breath sounds. No stridor. No decreased breath sounds, wheezing, rhonchi or rales.  Chest:     Chest wall: No tenderness.  Abdominal:     General: Abdomen is flat. Bowel sounds are normal. There is no distension.     Palpations: Abdomen is soft. Abdomen is not rigid.     Tenderness: There is no abdominal tenderness. There is no guarding or rebound.  Musculoskeletal:        General: Normal range of motion.     Cervical back: Normal range of motion and neck supple. No edema, erythema or rigidity. No muscular tenderness. Normal range of motion.  Lymphadenopathy:     Head:     Right side of head: No submental or submandibular adenopathy.     Left side of head: No submental or submandibular adenopathy.     Cervical: No cervical adenopathy.  Skin:    General: Skin is warm and dry.     Coloration: Skin is not pale.     Findings: No rash.     Nails: There is no clubbing.  Neurological:  General: No focal deficit present.     Mental Status: She is alert and oriented to person, place,  and time.     Sensory: No sensory deficit.     Motor: No pronator drift.  Psychiatric:        Mood and Affect: Mood normal.        Speech: Speech normal.        Behavior: Behavior normal.      Results for orders placed or performed in visit on 12/18/21  POCT glucose (manual entry)  Result Value Ref Range   POC Glucose 126 (A) 70 - 99 mg/dl  POCT glycosylated hemoglobin (Hb A1C)  Result Value Ref Range   Hemoglobin A1C     HbA1c POC (<> result, manual entry)     HbA1c, POC (prediabetic range)     HbA1c, POC (controlled diabetic range) 7.5 (A) 0.0 - 7.0 %    Last CBC [name] Results  Component Value Date   WBC 7.6 04/12/2021   HGB 11.3 04/12/2021   HCT 34.6 04/12/2021   MCV 85 04/12/2021   MCH 27.8 04/12/2021   RDW 13.0 04/12/2021   PLT 324 78/58/8502   Last metabolic panel [name] Results  Component Value Date   GLUCOSE 162 (H) 09/12/2021   NA 140 09/12/2021   K 3.8 09/12/2021   CL 103 09/12/2021   CO2 23 09/12/2021   BUN 8 09/12/2021   CREATININE 0.78 09/12/2021   EGFR 94 09/12/2021   CALCIUM 9.1 09/12/2021   PROT 7.9 04/12/2021   ALBUMIN 4.0 04/12/2021   LABGLOB 3.9 04/12/2021   AGRATIO 1.0 (L) 04/12/2021   BILITOT 0.2 04/12/2021   ALKPHOS 91 04/12/2021   AST 15 04/12/2021   ALT 8 04/12/2021   ANIONGAP 10 11/02/2019   Last lipids [name] Results  Component Value Date   CHOL 168 10/19/2020   HDL 44 10/19/2020   LDLCALC 112 (H) 10/19/2020   TRIG 63 10/19/2020   CHOLHDL 3.8 10/19/2020   Last hemoglobin A1c [name] Results  Component Value Date   HGBA1C 7.5 (A) 12/18/2021   Last thyroid functions No results found for: "TSH", "T3TOTAL", "T4TOTAL", "THYROIDAB" Last vitamin D No results found for: "25OHVITD2", "25OHVITD3", "VD25OH" Last vitamin B12 and Folate No results found for: "VITAMINB12", "FOLATE"  The ASCVD Risk score (Arnett DK, et al., 2019) failed to calculate for the following reasons:   The patient has a prior MI or stroke diagnosis    Assessment  & Plan:   Problem List Items Addressed This Visit       Cardiovascular and Mediastinum   Essential hypertension - Primary    Still not at goal with blood pressure control  Plan is for the patient to begin Aldactone 25 mg daily and continue carvedilol twice daily along with valsartan HCT daily  We will have the patient come back in short-term follow-up in 3 weeks for repeat labs and review with Dr. Joya Gaskins      Relevant Medications   spironolactone (ALDACTONE) 25 MG tablet     Endocrine   Type 2 diabetes mellitus without complication, without long-term current use of insulin ([place])    Diabetes under good control no changes made      Relevant Orders   POCT glucose (manual entry) (Completed)   POCT glycosylated hemoglobin (Hb A1C) (Completed)   Return in about 3 weeks (around 01/08/2022) for htn, diabetes.    Asencion Noble, MD

## 2021-12-18 ENCOUNTER — Encounter: Payer: Self-pay | Admitting: Critical Care Medicine

## 2021-12-18 ENCOUNTER — Ambulatory Visit: Payer: 59 | Attending: Critical Care Medicine | Admitting: Critical Care Medicine

## 2021-12-18 ENCOUNTER — Other Ambulatory Visit: Payer: Self-pay

## 2021-12-18 VITALS — BP 160/100 | HR 66 | Wt 223.0 lb

## 2021-12-18 DIAGNOSIS — I10 Essential (primary) hypertension: Secondary | ICD-10-CM | POA: Diagnosis not present

## 2021-12-18 DIAGNOSIS — E119 Type 2 diabetes mellitus without complications: Secondary | ICD-10-CM | POA: Diagnosis not present

## 2021-12-18 LAB — POCT GLYCOSYLATED HEMOGLOBIN (HGB A1C): HbA1c, POC (controlled diabetic range): 7.5 % — AB (ref 0.0–7.0)

## 2021-12-18 LAB — GLUCOSE, POCT (MANUAL RESULT ENTRY): POC Glucose: 126 mg/dl — AB (ref 70–99)

## 2021-12-18 MED ORDER — SPIRONOLACTONE 25 MG PO TABS
25.0000 mg | ORAL_TABLET | Freq: Every day | ORAL | 2 refills | Status: DC
  Filled 2021-12-18: qty 30, 30d supply, fill #0

## 2021-12-18 NOTE — Patient Instructions (Signed)
Start Aldactone 1 pill daily for blood pressure  Continue valsartan HCT and carvedilol as you are taking  No other medication changes  Congratulations on your dietary changes that has improved your blood sugar control  Return to Dr. Delford Field 3 weeks for recheck on blood pressure

## 2021-12-18 NOTE — Assessment & Plan Note (Signed)
Diabetes under good control no changes made

## 2021-12-18 NOTE — Assessment & Plan Note (Signed)
Still not at goal with blood pressure control  Plan is for the patient to begin Aldactone 25 mg daily and continue carvedilol twice daily along with valsartan HCT daily  We will have the patient come back in short-term follow-up in 3 weeks for repeat labs and review with Dr. Delford Field

## 2021-12-19 ENCOUNTER — Ambulatory Visit: Payer: 59 | Attending: Critical Care Medicine | Admitting: Pharmacist

## 2021-12-19 ENCOUNTER — Other Ambulatory Visit: Payer: Self-pay

## 2021-12-19 VITALS — BP 153/85

## 2021-12-19 DIAGNOSIS — I10 Essential (primary) hypertension: Secondary | ICD-10-CM

## 2021-12-19 NOTE — Progress Notes (Signed)
   S:    [name] is a 46 y.o. female who presents for hypertension evaluation, education, and management. PMH is significant for HTN, CAD, and T2DM (A1c 6.6 on 09/12/2021). Patient was referred and last seen by Primary Care Provider, Dr. Delford Field, on 12/18/2021. Spironolactone added at that visit.  Today, patient arrives in good spirits and presents without assistance. Denies dizziness, headache, blurred vision, swelling. Medication adherence appropriate with all medications except spironolactone. She plans to pick-up and start this Thursday. She has taken her BP medications today.   Current antihypertensives include:  Valsartan 320 mg / HCTZ 25mg  once daily  Carvedilol 25mg  twice daily   Antihypertensives tried in the past include:  Amlodipine (caused hives), losartan (dizziness)  Reported home BP readings: none reported  Patient reported dietary habits: trying to make "healthier choices" Patient-reported exercise habits: set goals to start exercising more and has a plan for   O:  Vitals:   12/19/21 1132  BP: (!) 153/85   Last 3 Office BP readings: BP Readings from Last 3 Encounters:  12/19/21 (!) 153/85  12/18/21 (!) 160/100  11/14/21 (!) 155/92    BMET    Component Value Date/Time   NA 140 09/12/2021 1105   K 3.8 09/12/2021 1105   CL 103 09/12/2021 1105   CO2 23 09/12/2021 1105   GLUCOSE 162 (H) 09/12/2021 1105   GLUCOSE 165 (H) 11/02/2019 0651   BUN 8 09/12/2021 1105   CREATININE 0.78 09/12/2021 1105   CALCIUM 9.1 09/12/2021 1105   GFRNONAA 89 02/25/2020 1145   GFRAA 102 02/25/2020 1145    Renal function: CrCl cannot be calculated (Patient's most recent lab result is older than the maximum 21 days allowed.).  Clinical ASCVD: No  The ASCVD Risk score (Arnett DK, et al., 2019) failed to calculate for the following reasons:   The patient has a prior MI or stroke diagnosis  A/P: Hypertension longstanding currently above goal today. Some improvement seen  probably because she took her medication prior to this appointment. No home blood pressures available. BP goal < 130/80 mmHg. Medication adherence is suboptimal, but she plans to pick-up and start spironolactone Thursday. -Continue current BP medications.  -Encouraged patient to pick-up and start spironolactone.  -Educated patient to take medication before any doctor appointments unless explicitly instructed to hold specific medications.  -Encouraged lifestyle modifications for blood pressure control including reduced dietary sodium, increased exercise, adequate sleep. -Encouraged patient to check BP at home and bring log of readings to next visit. Counseled on proper use of home BP cuff.   Results reviewed and written information provided. Patient verbalized understanding of treatment plan. Total time in face-to-face counseling 20 minutes.   F/u clinic visit with me in 1 month.  Thank you for allowing pharmacy to participate in this patient's care.  2020, PharmD, Wednesday, CPP Clinical Pharmacist [facility] [phone]

## 2021-12-20 ENCOUNTER — Other Ambulatory Visit: Payer: Self-pay

## 2021-12-25 ENCOUNTER — Other Ambulatory Visit: Payer: Self-pay

## 2022-01-09 ENCOUNTER — Other Ambulatory Visit: Payer: Self-pay

## 2022-01-09 ENCOUNTER — Ambulatory Visit: Payer: 59 | Attending: Critical Care Medicine | Admitting: Critical Care Medicine

## 2022-01-09 ENCOUNTER — Encounter: Payer: Self-pay | Admitting: Critical Care Medicine

## 2022-01-09 VITALS — BP 161/96 | HR 85 | Temp 99.4°F | Resp 16 | Ht 62.0 in | Wt 225.0 lb

## 2022-01-09 DIAGNOSIS — I10 Essential (primary) hypertension: Secondary | ICD-10-CM

## 2022-01-09 DIAGNOSIS — E119 Type 2 diabetes mellitus without complications: Secondary | ICD-10-CM

## 2022-01-09 LAB — GLUCOSE, POCT (MANUAL RESULT ENTRY): POC Glucose: 139 mg/dl — AB (ref 70–99)

## 2022-01-09 MED ORDER — CARVEDILOL 25 MG PO TABS
25.0000 mg | ORAL_TABLET | Freq: Two times a day (BID) | ORAL | 4 refills | Status: DC
  Filled 2022-01-09: qty 60, 30d supply, fill #0

## 2022-01-09 MED ORDER — EMPAGLIFLOZIN 10 MG PO TABS
10.0000 mg | ORAL_TABLET | Freq: Every day | ORAL | 3 refills | Status: DC
  Filled 2022-01-09: qty 30, 30d supply, fill #0

## 2022-01-09 MED ORDER — ATORVASTATIN CALCIUM 40 MG PO TABS
ORAL_TABLET | Freq: Every day | ORAL | 3 refills | Status: DC
  Filled 2022-01-09: qty 30, 30d supply, fill #0

## 2022-01-09 MED ORDER — SPIRONOLACTONE 25 MG PO TABS
25.0000 mg | ORAL_TABLET | Freq: Every day | ORAL | 2 refills | Status: DC
  Filled 2022-01-09: qty 30, 30d supply, fill #0

## 2022-01-09 MED ORDER — HYDRALAZINE HCL 50 MG PO TABS
50.0000 mg | ORAL_TABLET | Freq: Three times a day (TID) | ORAL | 2 refills | Status: DC
  Filled 2022-01-09: qty 90, 30d supply, fill #0

## 2022-01-09 MED ORDER — VALSARTAN-HYDROCHLOROTHIAZIDE 320-25 MG PO TABS
1.0000 | ORAL_TABLET | Freq: Every day | ORAL | 3 refills | Status: DC
  Filled 2022-01-09: qty 30, 30d supply, fill #0

## 2022-01-09 MED ORDER — NORTRIPTYLINE HCL 25 MG PO CAPS
25.0000 mg | ORAL_CAPSULE | Freq: Every day | ORAL | 2 refills | Status: DC
  Filled 2022-01-09: qty 30, 30d supply, fill #0

## 2022-01-09 NOTE — Assessment & Plan Note (Signed)
Blood pressure not yet at goal plan to bring this patient back in follow-up short-term 3 weeks and add hydralazine 50 mg 3 times daily and maintain other medications as prescribed

## 2022-01-09 NOTE — Progress Notes (Signed)
Established Patient Office Visit  Subjective   Patient ID: Amy [name], female    DOB: Jul 20, 1975  Age: 46 y.o. MRN: [id number]  Cc primary care follow-up hypertension  11/14/21 [name] is a well appearing 46 year old female with a history of coronary artery disease, diabetes, and hypertension presents for follow up. No acute concerns today. Hemoglobin A1C 6.6 (08/2021). She states she checks her sugars twice a day and they usually run between 120-130. Adherent to her medications. Patient states she is doing her best to increase her activity level by brisk walking but struggles to stay disciplined due to the demand of her work. She is being proactive with her diet and is incorporating more vegetables and fresh fruits daily. Denies alcohol and substance abuse.  Blood pressure today 155/92. Last few office visits with other providers have also been elevated. In further questioning her about her diet we discussed salt intake. She admits to not monitoring her intake and often times will use sauces or dressings that she believes maybe increasing her blood pressure.   7/24 Patient is here for short-term follow-up on arrival blood pressure quite elevated 160/100 but she states at home it runs in the 142/95 range.  Blood sugar though is much improved 126 and she has lost some weight.  Patient is on the valsartan HCT and carvedilol.  A1c at the last visit was 6.6 and we continued with Jardiance daily.  Today the A1c is at 7.5  The patient also received a lifestyle medicine handout has been trying to change her diet and eat healthier food choices.   8/15 Patient seen for return visit on arrival blood pressure 161/96.  She states at home she does occasionally get 139/86.  When she was traveling to the [place] her blood pressure was lower.  She is trying to eat healthier.  She is started an exercise program.  She is taking the Aldactone daily.  There is been a slight reduction in  blood pressure but not substantial.  She is not yet at goal.  Patient maintains carvedilol 25 mg twice daily, Aldactone 25 mg daily, valsartan HCT maximum dose daily.  Note patient cannot tolerate amlodipine from hives Blood sugar is good today 139   Patient Active Problem List   Diagnosis Date Noted   Pronator syndrome, right 07/11/2021   Seasonal allergic rhinitis due to pollen 06/14/2021   Essential hypertension 01/04/2020   Type 2 diabetes mellitus without complication, without long-term current use of insulin ([place]) 01/04/2020   Class 3 severe obesity due to excess calories with serious comorbidity and body mass index (BMI) of 40.0 to 44.9 in adult [facility]) 01/04/2020   Coronary artery disease due to type 2 diabetes mellitus ([place]) 01/04/2020   Dental caries 01/04/2020   Past Medical History:  Diagnosis Date   Anemia    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   Coronary artery disease    Diabetes mellitus without complication (HCC)    Excessive or frequent menstruation 09/10/2012   Hyperlipidemia    Hypertension    NSTEMI (non-ST elevated myocardial infarction) ([place]) 10/30/2019   PONV (postoperative nausea and vomiting)    with ablation only   Past Surgical History:  Procedure Laterality Date   CHOLECYSTECTOMY  2006   CORONARY ANGIOPLASTY     DILITATION & CURRETTAGE/HYSTROSCOPY WITH HYDROTHERMAL ABLATION N/A 07/10/2013   Procedure: DILATATION & CURETTAGE/HYSTEROSCOPY WITH HYDROTHERMAL ABLATION;  Surgeon: Shelly Bombard, MD;  Location: [facility];  Service: Gynecology;  Laterality:  N/A;   LEFT HEART CATH AND CORONARY ANGIOGRAPHY N/A 11/02/2019   Procedure: LEFT HEART CATH AND CORONARY ANGIOGRAPHY;  Surgeon: Sherren Mocha, MD;  Location: [facility];  Service: Cardiovascular;  Laterality: N/A;   TUBAL LIGATION  1997   WISDOM TOOTH EXTRACTION  1998   Social History   Tobacco Use   Smoking status: Never   Smokeless tobacco: Never  Vaping Use   Vaping Use: Never  used  Substance Use Topics   Alcohol use: No   Drug use: No   Social History   Socioeconomic History   Marital status: Significant Other    Spouse name: McCarol Jones   Number of children: 2   Years of education: Not on file   Highest education level: Some college, no degree  Occupational History   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  Does not routinely exercise.  In school @ [employer] for nsg.         Lives in a one story home    Right Handed    Social Determinants of Health   Financial Resource Strain: Not on file  Food Insecurity: Not on file  Transportation Needs: No Transportation Needs (08/07/2018)   PRAPARE - Hydrologist (Medical): No    Lack of Transportation (Non-Medical): No  Physical Activity: Not on file  Stress: Not on file  Social Connections: Not on file  Intimate Partner Violence: Not on file   Family Status  Relation Name Status   Mother  Alive   Father  Alive   Mat Aunt  (Not Specified)   Annamarie Major  (Not Specified)   Other  (Not Specified)   Other  (Not Specified)   Other  (Not Specified)   Other  (Not Specified)   Neg Hx  (Not Specified)   Family History  Problem Relation Age of Onset   Breast cancer Mother    Hypertension Mother    Hypertension Father    Heart failure Maternal Aunt    Leukemia Paternal Uncle    COPD Other    Hypertension Other    Diabetes Other    Cancer Other    Colon cancer Neg Hx    Colon polyps Neg Hx    Esophageal cancer Neg Hx    Rectal cancer Neg Hx    Stomach cancer Neg Hx    Allergies  Allergen Reactions   Amlodipine  Hives   Hydrocodone-Acetaminophen Hives and Itching    And itching Itching and hives   Losartan Potassium-Hctz Other (See Comments)    Dizziness   Tetanus Antitoxin     Arm swelling size of a softball.    Tetanus Toxoids     Arm swelling size of a softball.     Review of Systems  Constitutional: Negative.  Negative for chills, diaphoresis, fever, malaise/fatigue and weight loss.  HENT: Negative.  Negative for congestion, hearing loss, nosebleeds, sore throat and tinnitus.   Eyes: Negative.  Negative for blurred vision, photophobia and redness.  Respiratory: Negative.  Negative for  cough, hemoptysis, sputum production, shortness of breath, wheezing and stridor.   Cardiovascular: Negative.  Negative for chest pain, palpitations, orthopnea, claudication, leg swelling and PND.  Gastrointestinal: Negative.  Negative for abdominal pain, blood in stool, constipation, diarrhea, heartburn, nausea and vomiting.  Genitourinary: Negative.  Negative for dysuria, flank pain, frequency, hematuria and urgency.  Musculoskeletal: Negative.  Negative for back pain, falls, joint pain, myalgias and neck pain.  Skin: Negative.  Negative for itching and rash.  Neurological: Negative.  Negative for dizziness, tingling, tremors, sensory change, speech change, focal weakness, seizures, loss of consciousness, weakness and headaches.  Endo/Heme/Allergies: Negative.  Negative for environmental allergies and polydipsia. Does not bruise/bleed easily.  Psychiatric/Behavioral:  Negative for depression, memory loss, substance abuse and suicidal ideas. The patient is not nervous/anxious and does not have insomnia.       Objective:     BP (!) 161/96 (BP Location: Left Wrist, Patient Position: Sitting, Cuff Size: Normal)   Pulse 85   Temp 99.4 F (37.4 C)   Resp 16   Ht '5\' 2"'  (1.575 m)   Wt 225 lb (102.1 kg)   SpO2 99%   BMI 41.15 kg/m  BP Readings from Last 3 Encounters:  01/09/22 (!) 161/96  12/19/21 (!)  153/85  12/18/21 (!) 160/100   Wt Readings from Last 3 Encounters:  01/09/22 225 lb (102.1 kg)  12/18/21 223 lb (101.2 kg)  11/14/21 222 lb 6.4 oz (100.9 kg)      Physical Exam Vitals reviewed.  Constitutional:      Appearance: Normal appearance. She is well-developed. She is obese. She is not diaphoretic.  HENT:     Head: Normocephalic and atraumatic.     Right Ear: Tympanic membrane normal.     Left Ear: Tympanic membrane normal.     Nose: Nose normal. No nasal deformity, septal deviation, mucosal edema or rhinorrhea.     Right Sinus: No maxillary sinus tenderness or frontal sinus tenderness.     Left Sinus: No maxillary sinus tenderness or frontal sinus tenderness.     Mouth/Throat:     Pharynx: No oropharyngeal exudate.     Comments: Refused oral physical exam Eyes:     General: No scleral icterus.    Conjunctiva/sclera: Conjunctivae normal.     Pupils: Pupils are equal, round, and reactive to light.  Neck:     Thyroid: No thyromegaly.     Vascular: No carotid bruit or JVD.     Trachea: Trachea normal. No tracheal tenderness or tracheal deviation.  Cardiovascular:     Rate and Rhythm: Normal rate and regular rhythm.     Chest Wall: PMI is not displaced.     Pulses: Normal pulses. No decreased pulses.     Heart sounds: Normal heart sounds, S1 normal and S2 normal. Heart sounds not distant. No murmur heard.    No systolic murmur is present.     No diastolic murmur is present.     No friction rub. No gallop. No S3 or S4 sounds.  Pulmonary:     Effort: Pulmonary effort is normal. No tachypnea, accessory muscle usage or respiratory distress.     Breath sounds: Normal breath sounds. No stridor. No decreased breath sounds, wheezing, rhonchi or rales.  Chest:     Chest wall: No tenderness.  Abdominal:     General: Abdomen is flat. Bowel sounds are normal. There is no distension.     Palpations: Abdomen is soft. Abdomen is not rigid.  Tenderness: There is no abdominal  tenderness. There is no guarding or rebound.  Musculoskeletal:        General: Normal range of motion.     Cervical back: Normal range of motion and neck supple. No edema, erythema or rigidity. No muscular tenderness. Normal range of motion.  Lymphadenopathy:     Head:     Right side of head: No submental or submandibular adenopathy.     Left side of head: No submental or submandibular adenopathy.     Cervical: No cervical adenopathy.  Skin:    General: Skin is warm and dry.     Coloration: Skin is not pale.     Findings: No rash.     Nails: There is no clubbing.  Neurological:     General: No focal deficit present.     Mental Status: She is alert and oriented to person, place, and time.     Sensory: No sensory deficit.     Motor: No pronator drift.  Psychiatric:        Mood and Affect: Mood normal.        Speech: Speech normal.        Behavior: Behavior normal.      Results for orders placed or performed in visit on 01/09/22  POCT glucose (manual entry)  Result Value Ref Range   POC Glucose 139 (A) 70 - 99 mg/dl    Last CBC Lab Results  Component Value Date   WBC 7.6 04/12/2021   HGB 11.3 04/12/2021   HCT 34.6 04/12/2021   MCV 85 04/12/2021   MCH 27.8 04/12/2021   RDW 13.0 04/12/2021   PLT 324 87/86/7672   Last metabolic panel Lab Results  Component Value Date   GLUCOSE 162 (H) 09/12/2021   NA 140 09/12/2021   K 3.8 09/12/2021   CL 103 09/12/2021   CO2 23 09/12/2021   BUN 8 09/12/2021   CREATININE 0.78 09/12/2021   EGFR 94 09/12/2021   CALCIUM 9.1 09/12/2021   PROT 7.9 04/12/2021   ALBUMIN 4.0 04/12/2021   LABGLOB 3.9 04/12/2021   AGRATIO 1.0 (L) 04/12/2021   BILITOT 0.2 04/12/2021   ALKPHOS 91 04/12/2021   AST 15 04/12/2021   ALT 8 04/12/2021   ANIONGAP 10 11/02/2019   Last lipids Lab Results  Component Value Date   CHOL 168 10/19/2020   HDL 44 10/19/2020   LDLCALC 112 (H) 10/19/2020   TRIG 63 10/19/2020   CHOLHDL 3.8 10/19/2020   Last  hemoglobin A1c Lab Results  Component Value Date   HGBA1C 7.5 (A) 12/18/2021   Last thyroid functions No results found for: "TSH", "T3TOTAL", "T4TOTAL", "THYROIDAB" Last vitamin D No results found for: "25OHVITD2", "25OHVITD3", "VD25OH" Last vitamin B12 and Folate No results found for: "VITAMINB12", "FOLATE"  The ASCVD Risk score (Arnett DK, et al., 2019) failed to calculate for the following reasons:   The patient has a prior MI or stroke diagnosis    Assessment & Plan:   Problem List Items Addressed This Visit       Cardiovascular and Mediastinum   Essential hypertension    Blood pressure not yet at goal plan to bring this patient back in follow-up short-term 3 weeks and add hydralazine 50 mg 3 times daily and maintain other medications as prescribed      Relevant Medications   hydrALAZINE (APRESOLINE) 50 MG tablet   atorvastatin (LIPITOR) 40 MG tablet   carvedilol (COREG) 25 MG tablet   spironolactone (ALDACTONE) 25  MG tablet   valsartan-hydrochlorothiazide (DIOVAN-HCT) 320-25 MG tablet     Endocrine   Type 2 diabetes mellitus without complication, without long-term current use of insulin (HCC) - Primary   Relevant Medications   atorvastatin (LIPITOR) 40 MG tablet   empagliflozin (JARDIANCE) 10 MG TABS tablet   valsartan-hydrochlorothiazide (DIOVAN-HCT) 320-25 MG tablet   Other Relevant Orders   POCT glucose (manual entry) (Completed)  Return in about 3 weeks (around 01/30/2022) for htn.    Asencion Noble, MD

## 2022-01-09 NOTE — Patient Instructions (Signed)
Start hydralazine 50 mg 3 times daily  Please consider getting a pill organizer  No change in other medications refills available downstairs  Return to Dr. Delford Field for blood pressure check 3 weeks

## 2022-01-10 ENCOUNTER — Other Ambulatory Visit: Payer: Self-pay

## 2022-01-15 ENCOUNTER — Other Ambulatory Visit: Payer: Self-pay

## 2022-01-25 ENCOUNTER — Other Ambulatory Visit: Payer: Self-pay

## 2022-01-25 ENCOUNTER — Ambulatory Visit: Payer: 59 | Attending: Critical Care Medicine | Admitting: Pharmacist

## 2022-01-25 VITALS — BP 157/78 | HR 42

## 2022-01-25 DIAGNOSIS — I10 Essential (primary) hypertension: Secondary | ICD-10-CM

## 2022-01-25 MED ORDER — SPIRONOLACTONE 25 MG PO TABS
12.5000 mg | ORAL_TABLET | Freq: Every day | ORAL | 2 refills | Status: DC
  Filled 2022-01-25: qty 15, 30d supply, fill #0

## 2022-01-25 MED ORDER — CARVEDILOL 12.5 MG PO TABS
12.5000 mg | ORAL_TABLET | Freq: Two times a day (BID) | ORAL | 2 refills | Status: DC
  Filled 2022-01-25: qty 60, 30d supply, fill #0

## 2022-01-25 NOTE — Progress Notes (Signed)
   S:     No chief complaint on file.  Amy [name] is a 46 y.o. female who presents for hypertension evaluation, education, and management.  PMH is significant for HTN, coronary artery disease, T2DM.  Patient was referred and last seen by Primary Care Provider, Dr. Delford Field, on 01/09/2022.   At last visit, BP was 161/96, Hydralazine 50 mg TID was started.   Today, patient arrives in good spirits and presents without assistance. Denies dizziness, headache, blurred vision, swelling.  Patient reports stopping Spironolactone 25 mg few weeks ago due to headache, lightheadedness. Patient had not picked Hydralazine yet, but plans to pick it up and start it this week.  Patient reports hypertension is longstanding.   Family/Social history:  Fhx: diabetes, HTN, HF Tobacco: never smoker  Patient reports adherence to Valsartan/HCTZ and Carvedilol. Patient has only taken Valsartan/HCTZ this morning.   Current antihypertensives include: Valsartan/HCTZ 320mg -25mg  once daily, Carvedilol 25mg  twice daily   Antihypertensives tried in the past include: Amlodipine (hives), Losartan (dizziness)   Reported home BP readings: 130s/70s  Patient reported dietary habits:  -Patient reports adherence with salt restrictions and doing intermittent fasting. -Caffeine: doesn't drink coffee or soda. Only drinks water.   Patient-reported exercise habits: -Patient goes for a walk about 3-4 days a week  O:  Vitals:   01/25/22 1053  BP: (!) 157/78  Pulse: (!) 42   Last 3 Office BP readings: BP Readings from Last 3 Encounters:  01/25/22 (!) 157/78  01/09/22 (!) 161/96  12/19/21 (!) 153/85   BMET    Component Value Date/Time   NA 140 09/12/2021 1105   K 3.8 09/12/2021 1105   CL 103 09/12/2021 1105   CO2 23 09/12/2021 1105   GLUCOSE 162 (H) 09/12/2021 1105   GLUCOSE 165 (H) 11/02/2019 0651   BUN 8 09/12/2021 1105   CREATININE 0.78 09/12/2021 1105   CALCIUM 9.1 09/12/2021 1105   GFRNONAA 89  02/25/2020 1145   GFRAA 102 02/25/2020 1145   Renal function: CrCl cannot be calculated (Patient's most recent lab result is older than the maximum 21 days allowed.).  Clinical ASCVD: No  The ASCVD Risk score (Arnett DK, et al., 2019) failed to calculate for the following reasons:   The patient has a prior MI or stroke diagnosis  A/P: Hypertension is longstanding currently uncontrolled on current medications. BP goal < 130/80 mmHg. Medication adherence appears suboptimal. -Decreased dose of Carvedilol 25 mg twice daily to 12.5 mg twice daily, as patient is bradycardic.  -Restarted and decreased the dose of Spironolactone 25 mg to 12.5 mg daily. -Continued Valsartan/HCTZ 320/25 mg daily. -Stopped Hydralazine 50 mg TID as patient is not taking. -F/u labs ordered - Lipid Panel -Counseled on lifestyle modifications for blood pressure control including reduced dietary sodium, increased exercise, adequate sleep. -Encouraged patient to check BP at home and bring log of readings to next visit. Counseled on proper use of home BP cuff.   Results reviewed and written information provided.    Written patient instructions provided. Patient verbalized understanding of treatment plan.  Total time in face to face counseling 30 minutes.    Follow-up:  -Follow up with PCP in a week, and with pharmacist in a month and half.   Patient seen with: 02/27/2020, PharmD Candidate [employer]  Class of 02/27/2020    Pharmacist:  2020, PharmD, [place], CPP Clinical Pharmacist [facility] [phone]

## 2022-01-26 ENCOUNTER — Telehealth: Payer: Self-pay

## 2022-01-26 LAB — LIPID PANEL
Chol/HDL Ratio: 3.8 ratio (ref 0.0–4.4)
Cholesterol, Total: 160 mg/dL (ref 100–199)
HDL: 42 mg/dL (ref >39)
LDL Chol Calc (NIH): 102 mg/dL — ABNORMAL HIGH (ref 0–99)
Triglycerides: 83 mg/dL (ref 0–149)
VLDL Cholesterol Cal: 16 mg/dL (ref 5–40)

## 2022-01-26 NOTE — Telephone Encounter (Signed)
Pt was called and vm was left, Information has been sent to nurse pool.   

## 2022-01-26 NOTE — Progress Notes (Signed)
Let pt know cholesterol still high due to diabetes.  Make sure she is taking cholesterol pill daily

## 2022-01-26 NOTE — Telephone Encounter (Signed)
-----   Message from Storm Frisk, MD sent at 01/26/2022  7:18 AM EDT ----- Let pt know cholesterol still high due to diabetes.  Make sure she is taking cholesterol pill daily

## 2022-01-29 NOTE — Progress Notes (Signed)
Established Patient Office Visit  Subjective   Patient ID: Amy [name], female    DOB: [birth year]  Age: 46 y.o. MRN: [id number]  Cc primary care follow-up hypertension  11/14/21 Adaley Kiene is a well appearing 46 year old female with a history of coronary artery disease, diabetes, and hypertension presents for follow up. No acute concerns today. Hemoglobin A1C 6.6 (08/2021). She states she checks her sugars twice a day and they usually run between 120-130. Adherent to her medications. Patient states she is doing her best to increase her activity level by brisk walking but struggles to stay disciplined due to the demand of her work. She is being proactive with her diet and is incorporating more vegetables and fresh fruits daily. Denies alcohol and substance abuse.  Blood pressure today 155/92. Last few office visits with other providers have also been elevated. In further questioning her about her diet we discussed salt intake. She admits to not monitoring her intake and often times will use sauces or dressings that she believes maybe increasing her blood pressure.   7/24 Patient is here for short-term follow-up on arrival blood pressure quite elevated 160/100 but she states at home it runs in the 142/95 range.  Blood sugar though is much improved 126 and she has lost some weight.  Patient is on the valsartan HCT and carvedilol.  A1c at the last visit was 6.6 and we continued with Jardiance daily.  Today the A1c is at 7.5  The patient also received a lifestyle medicine handout has been trying to change her diet and eat healthier food choices.   8/15 Patient seen for return visit on arrival blood pressure 161/96.  She states at home she does occasionally get 139/86.  When she was traveling to the [place] her blood pressure was lower.  She is trying to eat healthier.  She is started an exercise program.  She is taking the Aldactone daily.  There is been a slight reduction in  blood pressure but not substantial.  She is not yet at goal.  Patient maintains carvedilol 25 mg twice daily, Aldactone 25 mg daily, valsartan HCT maximum dose daily.  Note patient cannot tolerate amlodipine from hives Blood sugar is good today 139  9/5 The patient is seen in return follow-up on arrival blood pressure is 136/85.  She is seen by clinical pharmacy as documented below last week. We had added hydralazine 3 times daily but she was not taking the medication at that visit.  Clinical pharmacy also found her to be bradycardic and reduced her carvedilol to 12 and half milligram twice daily and did restart spironolactone at a lower dose of 12.5 mg daily.  Patient maintains valsartan HCT 320/25. Patient's been trying to follow a lifestyle medicine approach and is lost some weight and is exercising more.  She is more cautious about the salt that she is adding to her diet.  She has no other complaints.  She does agree to and did receive a flu vaccine this visit.   Patient Active Problem List   Diagnosis Date Noted   Pronator syndrome, right 07/11/2021   Seasonal allergic rhinitis due to pollen 06/14/2021   Essential hypertension 01/04/2020   Type 2 diabetes mellitus without complication, without long-term current use of insulin ([place]) 01/04/2020   Class 3 severe obesity due to excess calories with serious comorbidity and body mass index (BMI) of 40.0 to 44.9 in adult [facility]) 01/04/2020   Coronary artery disease due to type 2 diabetes  mellitus ([place]) 01/04/2020   Dental caries 01/04/2020   Past Medical History:  Diagnosis Date   Anemia    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   Coronary artery disease    Diabetes mellitus without complication ([place])    Excessive or frequent menstruation 09/10/2012   Hyperlipidemia    Hypertension    NSTEMI (non-ST elevated myocardial infarction) ([place]) 10/30/2019   PONV (postoperative nausea and vomiting)    with ablation only   Past Surgical  History:  Procedure Laterality Date   CHOLECYSTECTOMY  2006   CORONARY ANGIOPLASTY     DILITATION & CURRETTAGE/HYSTROSCOPY WITH HYDROTHERMAL ABLATION N/A 07/10/2013   Procedure: DILATATION & CURETTAGE/HYSTEROSCOPY WITH HYDROTHERMAL ABLATION;  Surgeon: Shelly Bombard, MD;  Location: [facility];  Service: Gynecology;  Laterality: N/A;   LEFT HEART CATH AND CORONARY ANGIOGRAPHY N/A 11/02/2019   Procedure: LEFT HEART CATH AND CORONARY ANGIOGRAPHY;  Surgeon: Sherren Mocha, MD;  Location: [place] [facility];  Service: Cardiovascular;  Laterality: N/A;   TUBAL LIGATION  1997   WISDOM TOOTH EXTRACTION  1998   Social History   Tobacco Use   Smoking status: Never   Smokeless tobacco: Never  Vaping Use   Vaping Use: Never used  Substance Use Topics   Alcohol use: No   Drug use: No   Social History   Socioeconomic History   Marital status: Significant Other    Spouse name: McCarol Jones   Number of children: 2   Years of education: Not on file   Highest education level: Some college, no degree  Occupational History   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  Does not routinely exercise.  In school @ [employer] for nsg.         Lives in a one story home    Right Handed    Social Determinants of Health   Financial Resource Strain: Not on file  Food Insecurity: Not on file  Transportation Needs: No Transportation Needs (08/07/2018)   PRAPARE - Hydrologist (Medical): No    Lack of Transportation (Non-Medical): No  Physical Activity: Not on file  Stress: Not on file  Social Connections: Not on file   Intimate Partner Violence: Not on file   Family Status  Relation Name Status   Mother  Alive   Father  Alive   Mat Aunt  (Not Specified)   Annamarie Major  (Not Specified)   Other  (Not Specified)   Other  (Not Specified)   Other  (Not Specified)   Other  (Not Specified)   Neg Hx  (Not Specified)   Family History  Problem Relation Age of Onset   Breast cancer Mother    Hypertension Mother    Hypertension Father    Heart failure Maternal Aunt    Leukemia Paternal Uncle    COPD Other    Hypertension Other    Diabetes Other    Cancer Other    Colon cancer Neg Hx    Colon polyps Neg Hx    Esophageal  cancer Neg Hx    Rectal cancer Neg Hx    Stomach cancer Neg Hx    Allergies  Allergen Reactions   Amlodipine Hives   Hydrocodone-Acetaminophen Hives and Itching    And itching Itching and hives   Losartan Potassium-Hctz Other (See Comments)    Dizziness   Tetanus Antitoxin     Arm swelling size of a softball.    Tetanus Toxoids     Arm swelling size of a softball.     Review of Systems  Constitutional: Negative.  Negative for chills, diaphoresis, fever, malaise/fatigue and weight loss.  HENT: Negative.  Negative for congestion, hearing loss, nosebleeds, sore throat and tinnitus.   Eyes: Negative.  Negative for blurred vision, photophobia and redness.  Respiratory: Negative.  Negative for cough, hemoptysis, sputum production, shortness of breath, wheezing and stridor.   Cardiovascular: Negative.  Negative for chest pain, palpitations, orthopnea, claudication, leg swelling and PND.  Gastrointestinal: Negative.  Negative for abdominal pain, blood in stool, constipation, diarrhea, heartburn, nausea and vomiting.  Genitourinary: Negative.  Negative for dysuria, flank pain, frequency, hematuria and urgency.  Musculoskeletal: Negative.  Negative for back pain, falls, joint pain, myalgias and neck pain.  Skin: Negative.  Negative for itching and rash.  Neurological: Negative.   Negative for dizziness, tingling, tremors, sensory change, speech change, focal weakness, seizures, loss of consciousness, weakness and headaches.  Endo/Heme/Allergies: Negative.  Negative for environmental allergies and polydipsia. Does not bruise/bleed easily.  Psychiatric/Behavioral:  Negative for depression, memory loss, substance abuse and suicidal ideas. The patient is not nervous/anxious and does not have insomnia.       Objective:     BP 136/85   Pulse 76   Ht '5\' 2"'  (1.575 m)   Wt 221 lb 12.8 oz (100.6 kg)   SpO2 100%   BMI 40.57 kg/m  BP Readings from Last 3 Encounters:  01/30/22 136/85  01/25/22 (!) 157/78  01/09/22 (!) 161/96   Wt Readings from Last 3 Encounters:  01/30/22 221 lb 12.8 oz (100.6 kg)  01/09/22 225 lb (102.1 kg)  12/18/21 223 lb (101.2 kg)      Physical Exam Vitals reviewed.  Constitutional:      Appearance: Normal appearance. She is well-developed. She is obese. She is not diaphoretic.  HENT:     Head: Normocephalic and atraumatic.     Right Ear: Tympanic membrane normal.     Left Ear: Tympanic membrane normal.     Nose: Nose normal. No nasal deformity, septal deviation, mucosal edema or rhinorrhea.     Right Sinus: No maxillary sinus tenderness or frontal sinus tenderness.     Left Sinus: No maxillary sinus tenderness or frontal sinus tenderness.     Mouth/Throat:     Pharynx: No oropharyngeal exudate.     Comments: Refused oral physical exam Eyes:     General: No scleral icterus.    Conjunctiva/sclera: Conjunctivae normal.     Pupils: Pupils are equal, round, and reactive to light.  Neck:     Thyroid: No thyromegaly.     Vascular: No carotid bruit or JVD.     Trachea: Trachea normal. No tracheal tenderness or tracheal deviation.  Cardiovascular:     Rate and Rhythm: Normal rate and regular rhythm.     Chest Wall: PMI is not displaced.     Pulses: Normal pulses. No decreased pulses.     Heart sounds: Normal heart sounds, S1 normal and  S2 normal. Heart sounds not distant. No murmur  heard.    No systolic murmur is present.     No diastolic murmur is present.     No friction rub. No gallop. No S3 or S4 sounds.  Pulmonary:     Effort: Pulmonary effort is normal. No tachypnea, accessory muscle usage or respiratory distress.     Breath sounds: Normal breath sounds. No stridor. No decreased breath sounds, wheezing, rhonchi or rales.  Chest:     Chest wall: No tenderness.  Abdominal:     General: Abdomen is flat. Bowel sounds are normal. There is no distension.     Palpations: Abdomen is soft. Abdomen is not rigid.     Tenderness: There is no abdominal tenderness. There is no guarding or rebound.  Musculoskeletal:        General: Normal range of motion.     Cervical back: Normal range of motion and neck supple. No edema, erythema or rigidity. No muscular tenderness. Normal range of motion.  Lymphadenopathy:     Head:     Right side of head: No submental or submandibular adenopathy.     Left side of head: No submental or submandibular adenopathy.     Cervical: No cervical adenopathy.  Skin:    General: Skin is warm and dry.     Coloration: Skin is not pale.     Findings: No rash.     Nails: There is no clubbing.  Neurological:     General: No focal deficit present.     Mental Status: She is alert and oriented to person, place, and time.     Sensory: No sensory deficit.     Motor: No pronator drift.  Psychiatric:        Mood and Affect: Mood normal.        Speech: Speech normal.        Behavior: Behavior normal.      No results found for any visits on 01/30/22.   Last CBC Lab Results  Component Value Date   WBC 7.6 04/12/2021   HGB 11.3 04/12/2021   HCT 34.6 04/12/2021   MCV 85 04/12/2021   MCH 27.8 04/12/2021   RDW 13.0 04/12/2021   PLT 324 41/93/7902   Last metabolic panel Lab Results  Component Value Date   GLUCOSE 162 (H) 09/12/2021   NA 140 09/12/2021   K 3.8 09/12/2021   CL 103 09/12/2021    CO2 23 09/12/2021   BUN 8 09/12/2021   CREATININE 0.78 09/12/2021   EGFR 94 09/12/2021   CALCIUM 9.1 09/12/2021   PROT 7.9 04/12/2021   ALBUMIN 4.0 04/12/2021   LABGLOB 3.9 04/12/2021   AGRATIO 1.0 (L) 04/12/2021   BILITOT 0.2 04/12/2021   ALKPHOS 91 04/12/2021   AST 15 04/12/2021   ALT 8 04/12/2021   ANIONGAP 10 11/02/2019   Last lipids Lab Results  Component Value Date   CHOL 160 01/25/2022   HDL 42 01/25/2022   LDLCALC 102 (H) 01/25/2022   TRIG 83 01/25/2022   CHOLHDL 3.8 01/25/2022   Last hemoglobin A1c Lab Results  Component Value Date   HGBA1C 7.5 (A) 12/18/2021   Last thyroid functions No results found for: "TSH", "T3TOTAL", "T4TOTAL", "THYROIDAB" Last vitamin D No results found for: "25OHVITD2", "25OHVITD3", "VD25OH" Last vitamin B12 and Folate No results found for: "VITAMINB12", "FOLATE"  The ASCVD Risk score (Arnett DK, et al., 2019) failed to calculate for the following reasons:   The patient has a prior MI or stroke diagnosis    Assessment &  Plan:   Problem List Items Addressed This Visit       Cardiovascular and Mediastinum   Essential hypertension - Primary    Blood pressure now at goal with changes made by pharmacy and pulse rate back to normal at 76 with reduced dose of carvedilol  Patient will maintain carvedilol 12.5 mg twice daily, valsartan HCTZ 320/25 daily, and spironolactone 12.5 mg daily We will obtain metabolic panel  Have patient return in 5 weeks for follow-up       Relevant Orders   BMP8+eGFR     Endocrine   Type 2 diabetes mellitus without complication, without long-term current use of insulin (HCC)   Relevant Orders   BMP8+eGFR   Other Visit Diagnoses     Need for immunization against influenza       Relevant Orders   Flu Vaccine QUAD 39moIM (Fluarix, Fluzone & Alfiuria Quad PF) (Completed)     Return in about 5 weeks (around 03/06/2022) for htn, diabetes.    PAsencion Noble MD

## 2022-01-30 ENCOUNTER — Encounter: Payer: Self-pay | Admitting: Critical Care Medicine

## 2022-01-30 ENCOUNTER — Ambulatory Visit: Payer: 59 | Attending: Critical Care Medicine | Admitting: Critical Care Medicine

## 2022-01-30 VITALS — BP 136/85 | HR 76 | Ht 62.0 in | Wt 221.8 lb

## 2022-01-30 DIAGNOSIS — E119 Type 2 diabetes mellitus without complications: Secondary | ICD-10-CM

## 2022-01-30 DIAGNOSIS — I10 Essential (primary) hypertension: Secondary | ICD-10-CM | POA: Diagnosis not present

## 2022-01-30 DIAGNOSIS — Z23 Encounter for immunization: Secondary | ICD-10-CM | POA: Diagnosis not present

## 2022-01-30 NOTE — Assessment & Plan Note (Signed)
Blood pressure now at goal with changes made by pharmacy and pulse rate back to normal at 76 with reduced dose of carvedilol  Patient will maintain carvedilol 12.5 mg twice daily, valsartan HCTZ 320/25 daily, and spironolactone 12.5 mg daily We will obtain metabolic panel  Have patient return in 5 weeks for follow-up

## 2022-01-30 NOTE — Patient Instructions (Addendum)
No change in medications for now for your blood pressure it is improved continue your lifestyle management approach as you are performing  Dr Delford Field will discuss with Amy [name] any additional changes we can make to get you below 130/80, for now no changes  We will metabolic panel today with labs  Return Dr Delford Field 5 weeks   Flu vaccine was given

## 2022-01-31 LAB — BMP8+EGFR
BUN/Creatinine Ratio: 12 (ref 9–23)
BUN: 9 mg/dL (ref 6–24)
CO2: 25 mmol/L (ref 20–29)
Calcium: 9.6 mg/dL (ref 8.7–10.2)
Chloride: 98 mmol/L (ref 96–106)
Creatinine, Ser: 0.76 mg/dL (ref 0.57–1.00)
Glucose: 116 mg/dL — ABNORMAL HIGH (ref 70–99)
Potassium: 4.5 mmol/L (ref 3.5–5.2)
Sodium: 134 mmol/L (ref 134–144)
eGFR: 97 mL/min/{1.73_m2} (ref >59)

## 2022-01-31 NOTE — Progress Notes (Signed)
Let pt know kidney normal .   Stay on meds as prescribed

## 2022-02-01 ENCOUNTER — Other Ambulatory Visit: Payer: Self-pay

## 2022-02-05 ENCOUNTER — Telehealth: Payer: Self-pay

## 2022-02-05 NOTE — Telephone Encounter (Signed)
-----   Message from Storm Frisk, MD sent at 01/31/2022  5:43 AM EDT ----- Let pt know kidney normal .   Stay on meds as prescribed

## 2022-02-05 NOTE — Telephone Encounter (Signed)
Pt was called and vm was left, Information has been sent to nurse pool.   

## 2022-03-08 ENCOUNTER — Other Ambulatory Visit: Payer: Self-pay

## 2022-03-08 ENCOUNTER — Ambulatory Visit: Payer: 59 | Attending: Critical Care Medicine | Admitting: Critical Care Medicine

## 2022-03-08 ENCOUNTER — Encounter: Payer: Self-pay | Admitting: Critical Care Medicine

## 2022-03-08 VITALS — BP 128/86 | HR 76 | Wt 223.2 lb

## 2022-03-08 DIAGNOSIS — I10 Essential (primary) hypertension: Secondary | ICD-10-CM | POA: Diagnosis not present

## 2022-03-08 DIAGNOSIS — E119 Type 2 diabetes mellitus without complications: Secondary | ICD-10-CM

## 2022-03-08 DIAGNOSIS — J301 Allergic rhinitis due to pollen: Secondary | ICD-10-CM

## 2022-03-08 MED ORDER — VALSARTAN-HYDROCHLOROTHIAZIDE 320-25 MG PO TABS
1.0000 | ORAL_TABLET | Freq: Every day | ORAL | 3 refills | Status: DC
  Filled 2022-03-08 – 2022-05-25 (×2): qty 30, 30d supply, fill #0

## 2022-03-08 MED ORDER — FLUTICASONE PROPIONATE 50 MCG/ACT NA SUSP
2.0000 | Freq: Every day | NASAL | 6 refills | Status: AC
  Filled 2022-03-08 – 2022-12-20 (×3): qty 16, 30d supply, fill #0

## 2022-03-08 MED ORDER — ATORVASTATIN CALCIUM 40 MG PO TABS
40.0000 mg | ORAL_TABLET | Freq: Every day | ORAL | 3 refills | Status: DC
  Filled 2022-03-08 – 2022-12-27 (×2): qty 30, 30d supply, fill #0
  Filled 2023-02-02 – 2023-02-07 (×2): qty 30, 30d supply, fill #1

## 2022-03-08 MED ORDER — SPIRONOLACTONE 25 MG PO TABS
12.5000 mg | ORAL_TABLET | Freq: Every day | ORAL | 2 refills | Status: DC
  Filled 2022-03-08 – 2022-05-25 (×2): qty 15, 30d supply, fill #0

## 2022-03-08 MED ORDER — NORTRIPTYLINE HCL 25 MG PO CAPS
25.0000 mg | ORAL_CAPSULE | Freq: Every day | ORAL | 2 refills | Status: DC
  Filled 2022-03-08 – 2022-05-25 (×3): qty 30, 30d supply, fill #0

## 2022-03-08 MED ORDER — CARVEDILOL 12.5 MG PO TABS
12.5000 mg | ORAL_TABLET | Freq: Two times a day (BID) | ORAL | 2 refills | Status: DC
  Filled 2022-03-08 – 2022-05-25 (×3): qty 60, 30d supply, fill #0

## 2022-03-08 MED ORDER — EMPAGLIFLOZIN 10 MG PO TABS
10.0000 mg | ORAL_TABLET | Freq: Every day | ORAL | 3 refills | Status: DC
  Filled 2022-03-08 – 2022-05-25 (×2): qty 30, 30d supply, fill #0

## 2022-03-08 NOTE — Patient Instructions (Signed)
No change in medications Return 4 months 

## 2022-03-08 NOTE — Assessment & Plan Note (Signed)
Blood pressure well controlled no changes made ?

## 2022-03-08 NOTE — Assessment & Plan Note (Signed)
Well-controlled diabetes we will monitor she needs an eye exam

## 2022-03-08 NOTE — Progress Notes (Signed)
Established Patient Office Visit  Subjective   Patient ID: Amy [name], female    DOB: 16-Apr-1976  Age: 46 y.o. MRN: [id number]  Cc primary care follow-up hypertension  11/14/21 Amy [name] is a well appearing 46 year old female with a history of coronary artery disease, diabetes, and hypertension presents for follow up. No acute concerns today. Hemoglobin A1C 6.6 (08/2021). She states she checks her sugars twice a day and they usually run between 120-130. Adherent to her medications. Patient states she is doing her best to increase her activity level by brisk walking but struggles to stay disciplined due to the demand of her work. She is being proactive with her diet and is incorporating more vegetables and fresh fruits daily. Denies alcohol and substance abuse.  Blood pressure today 155/92. Last few office visits with other providers have also been elevated. In further questioning her about her diet we discussed salt intake. She admits to not monitoring her intake and often times will use sauces or dressings that she believes maybe increasing her blood pressure.   7/24 Patient is here for short-term follow-up on arrival blood pressure quite elevated 160/100 but she states at home it runs in the 142/95 range.  Blood sugar though is much improved 126 and she has lost some weight.  Patient is on the valsartan HCT and carvedilol.  A1c at the last visit was 6.6 and we continued with Jardiance daily.  Today the A1c is at 7.5  The patient also received a lifestyle medicine handout has been trying to change her diet and eat healthier food choices.   8/15 Patient seen for return visit on arrival blood pressure 161/96.  She states at home she does occasionally get 139/86.  When she was traveling to the [place] her blood pressure was lower.  She is trying to eat healthier.  She is started an exercise program.  She is taking the Aldactone daily.  There is been a slight reduction in  blood pressure but not substantial.  She is not yet at goal.  Patient maintains carvedilol 25 mg twice daily, Aldactone 25 mg daily, valsartan HCT maximum dose daily.  Note patient cannot tolerate amlodipine from hives Blood sugar is good today 139  9/5 The patient is seen in return follow-up on arrival blood pressure is 136/85.  She is seen by clinical pharmacy as documented below last week. We had added hydralazine 3 times daily but she was not taking the medication at that visit.  Clinical pharmacy also found her to be bradycardic and reduced her carvedilol to 12 and half milligram twice daily and did restart spironolactone at a lower dose of 12.5 mg daily.  Patient maintains valsartan HCT 320/25. Patient's been trying to follow a lifestyle medicine approach and is lost some weight and is exercising more.  She is more cautious about the salt that she is adding to her diet.  She has no other complaints.  She does agree to and did receive a flu vaccine this visit.  10/12 Patient seen in return follow-up blood pressure on arrival is elevated still she has been taking her medications but on lots of stress.  Patient blood pressure rechecked was more normal 128/86.  She is taking multiple medications.  She is stressed because her daughter is pregnant with preeclampsia.  She is not exercising like she should.  She is trying to eat healthier     Patient Active Problem List   Diagnosis Date Noted   Pronator syndrome,  right 07/11/2021   Seasonal allergic rhinitis due to pollen 06/14/2021   Essential hypertension 01/04/2020   Type 2 diabetes mellitus without complication, without long-term current use of insulin ([place]) 01/04/2020   Class 3 severe obesity due to excess calories with serious comorbidity and body mass index (BMI) of 40.0 to 44.9 in adult [facility]) 01/04/2020   Coronary artery disease due to type 2 diabetes mellitus ([place]) 01/04/2020   Dental caries 01/04/2020   Past Medical History:   Diagnosis Date   Anemia    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   Coronary artery disease    Diabetes mellitus without complication (HCC)    Excessive or frequent menstruation 09/10/2012   Hyperlipidemia    Hypertension    NSTEMI (non-ST elevated myocardial infarction) ([place]) 10/30/2019   PONV (postoperative nausea and vomiting)    with ablation only   Past Surgical History:  Procedure Laterality Date   CHOLECYSTECTOMY  2006   CORONARY ANGIOPLASTY     DILITATION & CURRETTAGE/HYSTROSCOPY WITH HYDROTHERMAL ABLATION N/A 07/10/2013   Procedure: DILATATION & CURETTAGE/HYSTEROSCOPY WITH HYDROTHERMAL ABLATION;  Surgeon: Shelly Bombard, MD;  Location: [facility];  Service: Gynecology;  Laterality: N/A;   LEFT HEART CATH AND CORONARY ANGIOGRAPHY N/A 11/02/2019   Procedure: LEFT HEART CATH AND CORONARY ANGIOGRAPHY;  Surgeon: Sherren Mocha, MD;  Location: [facility];  Service: Cardiovascular;  Laterality: N/A;   TUBAL LIGATION  1997   WISDOM TOOTH EXTRACTION  1998   Social History   Tobacco Use   Smoking status: Never   Smokeless tobacco: Never  Vaping Use   Vaping Use: Never used  Substance Use Topics   Alcohol use: No   Drug use: No   Social History   Socioeconomic History   Marital status: Significant Other    Spouse name: McCarol Jones   Number of children: 2   Years of education: Not on file   Highest education level: Some college, no degree  Occupational History   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  Does not routinely exercise.  In school @ [employer] for nsg.         Lives in a one story home     Right Handed    Social Determinants of Health   Financial Resource Strain: Not on file  Food Insecurity: Not on file  Transportation Needs: No Transportation Needs (08/07/2018)   PRAPARE - Hydrologist (Medical): No    Lack of Transportation (Non-Medical): No  Physical Activity: Not on file  Stress: Not on file  Social Connections: Not on file  Intimate Partner Violence: Not on file   Family Status  Relation Name Status   Mother  Alive   Father  Alive   Mat Aunt  (Not Specified)   Annamarie Major  (Not Specified)   Other  (Not Specified)   Other  (Not Specified)   Other  (Not Specified)   Other  (Not Specified)   Neg Hx  (Not Specified)   Family History  Problem Relation Age  of Onset   Breast cancer Mother    Hypertension Mother    Hypertension Father    Heart failure Maternal Aunt    Leukemia Paternal Uncle    COPD Other    Hypertension Other    Diabetes Other    Cancer Other    Colon cancer Neg Hx    Colon polyps Neg Hx    Esophageal cancer Neg Hx    Rectal cancer Neg Hx    Stomach cancer Neg Hx    Allergies  Allergen Reactions   Amlodipine Hives   Hydrocodone-Acetaminophen Hives and Itching    And itching Itching and hives   Losartan Potassium-Hctz Other (See Comments)    Dizziness   Tetanus Antitoxin     Arm swelling size of a softball.    Tetanus Toxoids     Arm swelling size of a softball.     Review of Systems  Constitutional: Negative.  Negative for chills, diaphoresis, fever, malaise/fatigue and weight loss.  HENT: Negative.  Negative for congestion, hearing loss, nosebleeds, sore throat and tinnitus.   Eyes: Negative.  Negative for blurred vision, photophobia and redness.  Respiratory: Negative.  Negative for cough, hemoptysis, sputum production, shortness of breath, wheezing and stridor.   Cardiovascular: Negative.  Negative for chest pain, palpitations, orthopnea, claudication, leg swelling and PND.  Gastrointestinal:  Negative.  Negative for abdominal pain, blood in stool, constipation, diarrhea, heartburn, nausea and vomiting.  Genitourinary: Negative.  Negative for dysuria, flank pain, frequency, hematuria and urgency.  Musculoskeletal: Negative.  Negative for back pain, falls, joint pain, myalgias and neck pain.  Skin: Negative.  Negative for itching and rash.  Neurological: Negative.  Negative for dizziness, tingling, tremors, sensory change, speech change, focal weakness, seizures, loss of consciousness, weakness and headaches.  Endo/Heme/Allergies: Negative.  Negative for environmental allergies and polydipsia. Does not bruise/bleed easily.  Psychiatric/Behavioral:  Negative for depression, memory loss, substance abuse and suicidal ideas. The patient is not nervous/anxious and does not have insomnia.       Objective:     BP 128/86   Pulse 76   Wt 223 lb 3.2 oz (101.2 kg)   SpO2 96%   BMI 40.82 kg/m  BP Readings from Last 3 Encounters:  03/08/22 128/86  01/30/22 136/85  01/25/22 (!) 157/78   Wt Readings from Last 3 Encounters:  03/08/22 223 lb 3.2 oz (101.2 kg)  01/30/22 221 lb 12.8 oz (100.6 kg)  01/09/22 225 lb (102.1 kg)      Physical Exam Vitals reviewed.  Constitutional:      Appearance: Normal appearance. She is well-developed. She is obese. She is not diaphoretic.  HENT:     Head: Normocephalic and atraumatic.     Right Ear: Tympanic membrane normal.     Left Ear: Tympanic membrane normal.     Nose: Nose normal. No nasal deformity, septal deviation, mucosal edema or rhinorrhea.     Right Sinus: No maxillary sinus tenderness or frontal sinus tenderness.     Left Sinus: No maxillary sinus tenderness or frontal sinus tenderness.     Mouth/Throat:     Pharynx: No oropharyngeal exudate.     Comments: Refused oral physical exam Eyes:     General: No scleral icterus.    Conjunctiva/sclera: Conjunctivae normal.     Pupils: Pupils are equal, round, and reactive to light.  Neck:      Thyroid: No thyromegaly.     Vascular: No carotid bruit or JVD.     Trachea:  Trachea normal. No tracheal tenderness or tracheal deviation.  Cardiovascular:     Rate and Rhythm: Normal rate and regular rhythm.     Chest Wall: PMI is not displaced.     Pulses: Normal pulses. No decreased pulses.     Heart sounds: Normal heart sounds, S1 normal and S2 normal. Heart sounds not distant. No murmur heard.    No systolic murmur is present.     No diastolic murmur is present.     No friction rub. No gallop. No S3 or S4 sounds.  Pulmonary:     Effort: Pulmonary effort is normal. No tachypnea, accessory muscle usage or respiratory distress.     Breath sounds: Normal breath sounds. No stridor. No decreased breath sounds, wheezing, rhonchi or rales.  Chest:     Chest wall: No tenderness.  Abdominal:     General: Abdomen is flat. Bowel sounds are normal. There is no distension.     Palpations: Abdomen is soft. Abdomen is not rigid.     Tenderness: There is no abdominal tenderness. There is no guarding or rebound.  Musculoskeletal:        General: Normal range of motion.     Cervical back: Normal range of motion and neck supple. No edema, erythema or rigidity. No muscular tenderness. Normal range of motion.  Lymphadenopathy:     Head:     Right side of head: No submental or submandibular adenopathy.     Left side of head: No submental or submandibular adenopathy.     Cervical: No cervical adenopathy.  Skin:    General: Skin is warm and dry.     Coloration: Skin is not pale.     Findings: No rash.     Nails: There is no clubbing.  Neurological:     General: No focal deficit present.     Mental Status: She is alert and oriented to person, place, and time.     Sensory: No sensory deficit.     Motor: No pronator drift.  Psychiatric:        Mood and Affect: Mood normal.        Speech: Speech normal.        Behavior: Behavior normal.      No results found for any visits on  03/08/22.   Last CBC Lab Results  Component Value Date   WBC 7.6 04/12/2021   HGB 11.3 04/12/2021   HCT 34.6 04/12/2021   MCV 85 04/12/2021   MCH 27.8 04/12/2021   RDW 13.0 04/12/2021   PLT 324 26/20/3559   Last metabolic panel Lab Results  Component Value Date   GLUCOSE 116 (H) 01/30/2022   NA 134 01/30/2022   K 4.5 01/30/2022   CL 98 01/30/2022   CO2 25 01/30/2022   BUN 9 01/30/2022   CREATININE 0.76 01/30/2022   EGFR 97 01/30/2022   CALCIUM 9.6 01/30/2022   PROT 7.9 04/12/2021   ALBUMIN 4.0 04/12/2021   LABGLOB 3.9 04/12/2021   AGRATIO 1.0 (L) 04/12/2021   BILITOT 0.2 04/12/2021   ALKPHOS 91 04/12/2021   AST 15 04/12/2021   ALT 8 04/12/2021   ANIONGAP 10 11/02/2019   Last lipids Lab Results  Component Value Date   CHOL 160 01/25/2022   HDL 42 01/25/2022   LDLCALC 102 (H) 01/25/2022   TRIG 83 01/25/2022   CHOLHDL 3.8 01/25/2022   Last hemoglobin A1c Lab Results  Component Value Date   HGBA1C 7.5 (A) 12/18/2021   Last thyroid  functions No results found for: "TSH", "T3TOTAL", "T4TOTAL", "THYROIDAB" Last vitamin D No results found for: "25OHVITD2", "25OHVITD3", "VD25OH" Last vitamin B12 and Folate No results found for: "VITAMINB12", "FOLATE"  The ASCVD Risk score (Arnett DK, et al., 2019) failed to calculate for the following reasons:   The patient has a prior MI or stroke diagnosis    Assessment & Plan:   Problem List Items Addressed This Visit       Cardiovascular and Mediastinum   Essential hypertension    Blood pressure well controlled no changes made      Relevant Medications   atorvastatin (LIPITOR) 40 MG tablet   carvedilol (COREG) 12.5 MG tablet   spironolactone (ALDACTONE) 25 MG tablet   valsartan-hydrochlorothiazide (DIOVAN-HCT) 320-25 MG tablet     Respiratory   Seasonal allergic rhinitis due to pollen   Relevant Medications   fluticasone (FLONASE) 50 MCG/ACT nasal spray     Endocrine   Type 2 diabetes mellitus without  complication, without long-term current use of insulin ([place])    Well-controlled diabetes we will monitor she needs an eye exam      Relevant Medications   atorvastatin (LIPITOR) 40 MG tablet   empagliflozin (JARDIANCE) 10 MG TABS tablet   valsartan-hydrochlorothiazide (DIOVAN-HCT) 320-25 MG tablet  Return in about 4 months (around 07/09/2022) for htn.    Asencion Noble, MD

## 2022-03-15 ENCOUNTER — Other Ambulatory Visit: Payer: Self-pay

## 2022-03-16 ENCOUNTER — Encounter: Payer: Self-pay | Admitting: Pharmacist

## 2022-03-16 ENCOUNTER — Ambulatory Visit: Payer: 59 | Attending: Family Medicine | Admitting: Pharmacist

## 2022-03-16 VITALS — BP 144/83 | HR 92

## 2022-03-16 DIAGNOSIS — I10 Essential (primary) hypertension: Secondary | ICD-10-CM

## 2022-03-16 DIAGNOSIS — Z1329 Encounter for screening for other suspected endocrine disorder: Secondary | ICD-10-CM

## 2022-03-16 DIAGNOSIS — Z130 Encounter for screening for diseases of the blood and blood-forming organs and certain disorders involving the immune mechanism: Secondary | ICD-10-CM | POA: Diagnosis not present

## 2022-03-16 NOTE — Progress Notes (Signed)
   S:     No chief complaint on file.  Amy [name] is a 46 y.o. female who presents for hypertension evaluation, education, and management.  PMH is significant for HTN, coronary artery disease, T2DM.  Patient was referred and last seen by Primary Care Provider, Dr. Joya Gaskins, on 03/08/2022. BP looked okay at that appointment.   Today, patient arrives in good spirits and presents without assistance. Denies dizziness, headache, blurred vision, swelling. She remains adherent to her medications and took the valsartan-HCTZ this morning. She has not taken carvedilol or spironolactone yet. Of note, she tells me she always feels cold and this started a couple of weeks ago. She wonders if we can screen for anemia and thyroid disorders today.   Patient reports hypertension is longstanding.   Family/Social history:  Fhx: diabetes, HTN, HF Tobacco: never smoker  Current antihypertensives include: Valsartan/HCTZ 320mg -25mg  once daily, Carvedilol 12.5mg  twice daily, spironolactone 12.5 mg daily.   Antihypertensives tried in the past include: Amlodipine (hives), Losartan (dizziness)   Reported home BP readings: 130s/70s  Patient reported dietary habits:  -Patient reports adherence with salt restrictions and doing intermittent fasting. -Caffeine: doesn't drink coffee or soda. Only drinks water.   Patient-reported exercise habits: -Patient goes for a walk about 3-4 days a week  O:  Vitals:   03/16/22 0932  BP: (!) 144/83  Pulse: 92    Last 3 Office BP readings: BP Readings from Last 3 Encounters:  03/16/22 (!) 144/83  03/08/22 128/86  01/30/22 136/85   BMET    Component Value Date/Time   NA 134 01/30/2022 1037   K 4.5 01/30/2022 1037   CL 98 01/30/2022 1037   CO2 25 01/30/2022 1037   GLUCOSE 116 (H) 01/30/2022 1037   GLUCOSE 165 (H) 11/02/2019 0651   BUN 9 01/30/2022 1037   CREATININE 0.76 01/30/2022 1037   CALCIUM 9.6 01/30/2022 1037   GFRNONAA 89 02/25/2020 1145    GFRAA 102 02/25/2020 1145   Renal function: CrCl cannot be calculated (Patient's most recent lab result is older than the maximum 21 days allowed.).  Clinical ASCVD: No  The ASCVD Risk score (Arnett DK, et al., 2019) failed to calculate for the following reasons:   The patient has a prior MI or stroke diagnosis  A/P: Hypertension is longstanding currently above goal on current medications. BP goal < 130/80 mmHg. Medication adherence appears suboptimal. -Continue current regimen. -F/u labs ordered - CBC w/ diff, TSH w/ thyroid panel  -Counseled on lifestyle modifications for blood pressure control including reduced dietary sodium, increased exercise, adequate sleep. -Encouraged patient to check BP at home and bring log of readings to next visit. Counseled on proper use of home BP cuff.   Results reviewed and written information provided.    Written patient instructions provided. Patient verbalized understanding of treatment plan.  Total time in face to face counseling 30 minutes.    Follow-up:  -Follow up with PCP 07/11/2021. Sooner pending lab results.  -Me in 1 month for BP.  Benard Halsted, PharmD, [place], [place] [phone]

## 2022-03-17 LAB — CBC WITH DIFFERENTIAL/PLATELET
Basophils Absolute: 0 10*3/uL (ref 0.0–0.2)
Basos: 0 %
EOS (ABSOLUTE): 0.1 10*3/uL (ref 0.0–0.4)
Eos: 1 %
Hematocrit: 35.5 % (ref 34.0–46.6)
Hemoglobin: 11.7 g/dL (ref 11.1–15.9)
Immature Grans (Abs): 0 10*3/uL (ref 0.0–0.1)
Immature Granulocytes: 0 %
Lymphocytes Absolute: 2.3 10*3/uL (ref 0.7–3.1)
Lymphs: 26 %
MCH: 28.4 pg (ref 26.6–33.0)
MCHC: 33 g/dL (ref 31.5–35.7)
MCV: 86 fL (ref 79–97)
Monocytes Absolute: 0.5 10*3/uL (ref 0.1–0.9)
Monocytes: 6 %
Neutrophils Absolute: 5.7 10*3/uL (ref 1.4–7.0)
Neutrophils: 67 %
Platelets: 340 10*3/uL (ref 150–450)
RBC: 4.12 x10E6/uL (ref 3.77–5.28)
RDW: 12.6 % (ref 11.7–15.4)
WBC: 8.7 10*3/uL (ref 3.4–10.8)

## 2022-03-17 LAB — THYROID PANEL WITH TSH
Free Thyroxine Index: 1.9 (ref 1.2–4.9)
T3 Uptake Ratio: 22 % — ABNORMAL LOW (ref 24–39)
T4, Total: 8.7 ug/dL (ref 4.5–12.0)
TSH: 1.55 u[IU]/mL (ref 0.450–4.500)

## 2022-03-20 ENCOUNTER — Ambulatory Visit: Payer: Self-pay | Admitting: *Deleted

## 2022-03-20 NOTE — Telephone Encounter (Signed)
  Chief Complaint: Pt is concerned about her abnormal thyroid result.   It shows it's low and she is concerned.  The cut off is 24 and hers is 36. Symptoms: cold all the time which is why the thyroid test was done.  She saw result in [place]. Frequency: N/A Pertinent Negatives: Patient denies N/A Disposition: [] ED /[] Urgent Care (no appt availability in office) / [] Appointment(In office/virtual)/ []  [facility]/ [] Home Care/ [] Refused Recommended Disposition /[] [facility]/ [x]  Follow-up with PCP Additional Notes: I have sent a message to Dr. Margarita Rana letting her know you are concerned about the low number on your thyroid test even though Dr. Smitty Pluck note says all her labs are normal.

## 2022-03-20 NOTE — Telephone Encounter (Signed)
Reason for Disposition  [1] Caller requesting NON-URGENT health information AND [2] PCP's office is the best resource  Answer Assessment - Initial Assessment Questions 1. REASON FOR CALL or QUESTION: "What is your reason for calling today?" or "How can I best help you?" or "What question do you have that I can help answer?"     Pt is concerned about her thyroid lab being low.  It's supposed to be 24 or higher and hers is 22 so she is concerned because she is having symptoms of being cold all the time.  Protocols used: Information Only Call - No Triage-A-AH

## 2022-03-21 ENCOUNTER — Telehealth: Payer: Self-pay

## 2022-03-21 NOTE — Telephone Encounter (Signed)
Called patient to discuss her recent Tyroid results. No answer Lvm to call back.

## 2022-03-21 NOTE — Telephone Encounter (Signed)
Patient returning call, We discussed her recent labs results completed on 10.20.23 By Dr. Margarita Rana. She was informed that her overall thyroid function is within normal limits. It is not a concern if her T3 is slightly below normal limits. This is more than likely not the cause of her feeling cold. Patient was understanding to results, and has no further concerns at this time

## 2022-03-30 NOTE — Telephone Encounter (Signed)
Another message in chart, Pt is aware and was called regarding this

## 2022-04-05 ENCOUNTER — Ambulatory Visit: Payer: 59 | Attending: Internal Medicine | Admitting: Internal Medicine

## 2022-04-05 ENCOUNTER — Encounter: Payer: Self-pay | Admitting: Internal Medicine

## 2022-04-05 VITALS — BP 126/72 | HR 76 | Ht 62.0 in | Wt 225.2 lb

## 2022-04-05 DIAGNOSIS — I2510 Atherosclerotic heart disease of native coronary artery without angina pectoris: Secondary | ICD-10-CM

## 2022-04-05 DIAGNOSIS — E785 Hyperlipidemia, unspecified: Secondary | ICD-10-CM | POA: Diagnosis not present

## 2022-04-05 DIAGNOSIS — E119 Type 2 diabetes mellitus without complications: Secondary | ICD-10-CM

## 2022-04-05 DIAGNOSIS — I10 Essential (primary) hypertension: Secondary | ICD-10-CM | POA: Diagnosis not present

## 2022-04-05 NOTE — Patient Instructions (Signed)
Medication Instructions:  NO CHANGES  *If you need a refill on your cardiac medications before your next appointment, please call your pharmacy*   Follow-Up: At [facility], you and your health needs are our priority.  As part of our continuing mission to provide you with exceptional heart care, we have created designated Provider Care Teams.  These Care Teams include your primary Cardiologist (physician) and Advanced Practice Providers (APPs -  Physician Assistants and Nurse Practitioners) who all work together to provide you with the care you need, when you need it.  We recommend signing up for the patient portal called "MyChart".  Sign up information is provided on this After Visit Summary.  MyChart is used to connect with patients for Virtual Visits (Telemedicine).  Patients are able to view lab/test results, encounter notes, upcoming appointments, etc.  Non-urgent messages can be sent to your provider as well.   To learn more about what you can do with MyChart, go to [email or web address].    Your next appointment:   6 month(s)  The format for your next appointment:   In Person  Provider:   Kenneth C Hilty, MD   

## 2022-04-05 NOTE — Progress Notes (Signed)
OFFICE NOTE  Chief Complaint:  Follow-up  Primary Care Physician: Storm Frisk, MD  HPI:  Amy [name] is a 46 y.o. female with a past medial history significant for chest pain and recent NSTEMI in June 2021 with abnormal EKG demonstrating deep T wave inversions.  She underwent cardiac catheterization which showed only a 50% intermediate vessel stenosis however there was catheter induced spasm.  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  She also has cardiac risk factors including diabetes, hypertension and dyslipidemia.  She has been on high potency statin therapy however lipids in July were white low with a total cholesterol 94, HDL 32, LDL 44 and triglycerides 91.  She is also been experiencing persistent right wrist and arm pain.  This has been significant and she did underwent catheterization in that arm.  She did have a ultrasound which showed no evidence of arteriovenous issues.  She has a follow-up with her PCP in November however it seems like this needs to be further evaluated either with imaging or possible referral to neurology or orthopedics.  04/05/2022  Amy [name] returns today for follow-up.  Overall she says she is feeling very well.  She denies any further chest pain issues.  Her blood pressure is now finally controlled at 126/72.  I had backed off on her statin to see if it improves some of her arm pain however she was subsequently found to have some orthopedic issue.  This has improved.  Her lipids were very low about 2 years ago with LDL in the 40s which prompted me to back off on her a atorvastatin from 80 to 40 mg.  Subsequently the last year her cholesterol had gone with LDL 112.  Is now back to 102 and she continues to work on diet and weight loss.  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   Coronary artery disease    Diabetes mellitus without complication (HCC)    Excessive  or frequent menstruation 09/10/2012   Hyperlipidemia    Hypertension    NSTEMI (non-ST elevated myocardial infarction) (HCC) 10/30/2019   PONV (postoperative nausea and vomiting)    with ablation only    Past Surgical History:  Procedure Laterality Date   CHOLECYSTECTOMY  2006   CORONARY ANGIOPLASTY     DILITATION & CURRETTAGE/HYSTROSCOPY WITH HYDROTHERMAL ABLATION N/A 07/10/2013   Procedure: DILATATION & CURETTAGE/HYSTEROSCOPY WITH HYDROTHERMAL ABLATION;  Surgeon: Brock Bad, MD;  Location: [facility];  Service: Gynecology;  Laterality: N/A;   LEFT HEART CATH AND CORONARY ANGIOGRAPHY N/A 11/02/2019   Procedure: LEFT HEART CATH AND CORONARY ANGIOGRAPHY;  Surgeon: Tonny Bollman, MD;  Location: [facility] INVASIVE CV LAB;  Service: Cardiovascular;  Laterality: N/A;   TUBAL LIGATION  1997   WISDOM TOOTH EXTRACTION  1998    FAMHx:  Family History  Problem Relation Age of Onset   Breast cancer Mother    Hypertension Mother    Hypertension Father    Heart failure Maternal Aunt    Leukemia Paternal Uncle    COPD Other    Hypertension Other    Diabetes Other    Cancer Other    Colon cancer Neg Hx    Colon polyps Neg Hx    Esophageal cancer Neg Hx    Rectal cancer Neg Hx    Stomach cancer Neg Hx     SOCHx:   reports that she has never smoked. She  has never used smokeless tobacco. She reports that she does not drink alcohol and does not use drugs.  ALLERGIES:  Allergies  Allergen Reactions   Amlodipine Hives   Hydrocodone-Acetaminophen Hives and Itching    And itching Itching and hives   Losartan Potassium-Hctz Other (See Comments)    Dizziness   Tetanus Antitoxin     Arm swelling size of a softball.    Tetanus Toxoids     Arm swelling size of a softball.     ROS: Pertinent items noted in HPI and remainder of comprehensive ROS otherwise negative.  HOME MEDS: Current Outpatient Medications on File Prior to Visit  Medication Sig Dispense Refill   aspirin 81 MG EC tablet  Take 1 tablet (81 mg total) by mouth daily. 90 tablet 3   atorvastatin (LIPITOR) 40 MG tablet Take 1 tablet (40 mg total) by mouth daily. 90 tablet 3   carvedilol (COREG) 12.5 MG tablet Take 1 tablet (12.5 mg total) by mouth 2 (two) times daily with a meal. 60 tablet 2   empagliflozin (JARDIANCE) 10 MG TABS tablet Take 1 tablet (10 mg total) by mouth daily. 30 tablet 3   fexofenadine (ALLEGRA) 180 MG tablet Take 180 mg by mouth daily.     fluticasone (FLONASE) 50 MCG/ACT nasal spray Place 2 sprays into both nostrils daily. 16 g 6   Multiple Vitamin (MULTIVITAMIN WITH MINERALS) TABS tablet Take 1 tablet by mouth daily.     nitroGLYCERIN (NITROSTAT) 0.4 MG SL tablet Place 1 tablet (0.4 mg total) under the tongue every 5 (five) minutes x 3 doses as needed for chest pain. 25 tablet 11   nortriptyline (PAMELOR) 25 MG capsule Take 1 capsule (25 mg total) by mouth at bedtime to prevent pain the next day takes as needed. 30 capsule 2   spironolactone (ALDACTONE) 25 MG tablet Take 0.5 tablets (12.5 mg total) by mouth daily. 15 tablet 2   valsartan-hydrochlorothiazide (DIOVAN-HCT) 320-25 MG tablet Take 1 tablet by mouth daily. 90 tablet 3   No current facility-administered medications on file prior to visit.    LABS/IMAGING: No results found for this or any previous visit (from the past 48 hour(s)). No results found.  LIPID PANEL:    Component Value Date/Time   CHOL 160 01/25/2022 1035   TRIG 83 01/25/2022 1035   HDL 42 01/25/2022 1035   CHOLHDL 3.8 01/25/2022 1035   CHOLHDL 4.1 11/01/2019 0726   VLDL 23 11/01/2019 0726   LDLCALC 102 (H) 01/25/2022 1035     WEIGHTS: Wt Readings from Last 3 Encounters:  04/05/22 225 lb 3.2 oz (102.2 kg)  03/08/22 223 lb 3.2 oz (101.2 kg)  01/30/22 221 lb 12.8 oz (100.6 kg)    VITALS: BP 126/72   Pulse 76   Ht 5\' 2"  (1.575 m)   Wt 225 lb 3.2 oz (102.2 kg)   SpO2 97%   BMI 41.19 kg/m   EXAM: General appearance: alert, no distress and morbidly  obese Neck: no carotid bruit, no JVD and thyroid not enlarged, symmetric, no tenderness/mass/nodules Lungs: clear to auscultation bilaterally Heart: regular rate and rhythm, S1, S2 normal, no murmur, click, rub or gallop Abdomen: soft, non-tender; bowel sounds normal; no masses,  no organomegaly Extremities: extremities normal, atraumatic, no cyanosis or edema Pulses: 2+ and symmetric Skin: Skin color, texture, turgor normal. No rashes or lesions Neurologic: Grossly normal : Pleasant  EKG: Sinus rhythm at 76, possible left atrial enlargement-personally reviewed  ASSESSMENT: NSTEMI-thought to be related to  vasospasm (10/2019) Type 2 diabetes Dyslipidemia Hypertension Morbid obesity  PLAN: 1.   Amy [name] continues to do well with interval resolution of her right arm pain.  She is working on weight loss and dietary changes.  Her cholesterols come down accordingly.  I would recommend staying on her current dose of the atorvastatin.  Would repeat lipids in 3 to 6 months.  Her target LDL is less than 70 and if she is not able to achieve that with the lifestyle modifications, would consider adding ezetimibe to her statin.  Plan follow-up with me in 6 months or sooner as necessary.  Amy Nose, MD, [facility], FACP  [place]  [facility]  Medical Director of the [facility]  Cardiovascular Risk Reduction Clinic Diplomate of the American Board of Clinical Lipidology Attending Cardiologist  Direct Dial: [phone]  Fax: [phone]  Website:  [name] 04/05/2022, 9:23 AM

## 2022-04-16 ENCOUNTER — Ambulatory Visit: Payer: 59 | Attending: Family Medicine | Admitting: Pharmacist

## 2022-04-16 ENCOUNTER — Encounter: Payer: Self-pay | Admitting: Pharmacist

## 2022-04-16 VITALS — BP 162/77

## 2022-04-16 DIAGNOSIS — I10 Essential (primary) hypertension: Secondary | ICD-10-CM | POA: Diagnosis not present

## 2022-04-16 NOTE — Progress Notes (Signed)
   S:    No chief complaint on file.  Amy [name] is a 46 y.o. female who presents for hypertension evaluation, education, and management.  PMH is significant for HTN, coronary artery disease, T2DM.  Patient was referred and last seen by Primary Care Provider, Dr. Delford Field, on 03/08/2022. Last seen by pharmacy clinic on 03/16/2022.   Today, patient arrives in good spirits and presents without assistance. Denies dizziness, headache, blurred vision, swelling. Of note, patient is very tearful upon arrival. States she knows her BP will be elevated today as her and her husband have been arguing. She has taken all of her BP medications today. BP at cardiology appointment on 04/05/2022 was 126/77 mmHg.   Patient reports hypertension is longstanding.   Family/Social history:  Fhx: diabetes, HTN, HF Tobacco: never smoker  Current antihypertensives include: Valsartan/HCTZ 320mg -25mg  once daily, Carvedilol 12.5mg  twice daily, spironolactone 12.5 mg daily.   Antihypertensives tried in the past include: Amlodipine (hives), Losartan (dizziness)   Reported home BP readings: 120-130s/70s  Patient reported dietary habits:  -Patient reports adherence with salt restrictions and doing intermittent fasting. -Caffeine: doesn't drink coffee or soda. Only drinks water.   Patient-reported exercise habits: -Patient goes for a walk about 3-4 days a week  O:  Vitals:   04/16/22 0917  BP: (!) 162/77    Last 3 Office BP readings: BP Readings from Last 3 Encounters:  04/16/22 (!) 162/77  04/05/22 126/72  03/16/22 (!) 144/83   BMET    Component Value Date/Time   NA 134 01/30/2022 1037   K 4.5 01/30/2022 1037   CL 98 01/30/2022 1037   CO2 25 01/30/2022 1037   GLUCOSE 116 (H) 01/30/2022 1037   GLUCOSE 165 (H) 11/02/2019 0651   BUN 9 01/30/2022 1037   CREATININE 0.76 01/30/2022 1037   CALCIUM 9.6 01/30/2022 1037   GFRNONAA 89 02/25/2020 1145   GFRAA 102 02/25/2020 1145   Renal  function: CrCl cannot be calculated (Patient's most recent lab result is older than the maximum 21 days allowed.).  Clinical ASCVD: No  The ASCVD Risk score (Arnett DK, et al., 2019) failed to calculate for the following reasons:   The patient has a prior MI or stroke diagnosis  A/P: Hypertension is longstanding currently above goal on current medications. BP goal < 130/80 mmHg. Medication adherence appears suboptimal. Patient under a considerable amount of stress today which is likely the cause of her elevated BP.  -Continue current regimen given home BP readings and BP at cardiology visit were at goal.  -Counseled on lifestyle modifications for blood pressure control including reduced dietary sodium, increased exercise, adequate sleep. -Encouraged patient to check BP at home and bring log of readings to next visit. Counseled on proper use of home BP cuff.   Results reviewed and written information provided.    Written patient instructions provided. Patient verbalized understanding of treatment plan.  Total time in face to face counseling 20 minutes.    Follow-up:  -Follow up with PCP 07/11/2021.   2020, Pharm.D. PGY-2 Ambulatory Care Pharmacy Resident 04/16/2022 9:23 AM

## 2022-05-06 ENCOUNTER — Emergency Department (HOSPITAL_COMMUNITY): Payer: 59

## 2022-05-06 ENCOUNTER — Emergency Department (HOSPITAL_COMMUNITY)
Admission: EM | Admit: 2022-05-06 | Discharge: 2022-05-06 | Disposition: A | Discharge status: Home / Self Care | Payer: 59 | Attending: Emergency Medicine | Admitting: Emergency Medicine

## 2022-05-06 ENCOUNTER — Other Ambulatory Visit: Payer: Self-pay

## 2022-05-06 ENCOUNTER — Encounter (HOSPITAL_COMMUNITY): Payer: Self-pay

## 2022-05-06 DIAGNOSIS — R079 Chest pain, unspecified: Secondary | ICD-10-CM | POA: Diagnosis not present

## 2022-05-06 DIAGNOSIS — R0789 Other chest pain: Secondary | ICD-10-CM

## 2022-05-06 DIAGNOSIS — Z1152 Encounter for screening for COVID-19: Secondary | ICD-10-CM | POA: Insufficient documentation

## 2022-05-06 DIAGNOSIS — Z7982 Long term (current) use of aspirin: Secondary | ICD-10-CM | POA: Insufficient documentation

## 2022-05-06 DIAGNOSIS — R059 Cough, unspecified: Secondary | ICD-10-CM | POA: Diagnosis not present

## 2022-05-06 LAB — RESP PANEL BY RT-PCR (RSV, FLU A&B, COVID)  RVPGX2
Influenza A by PCR: NEGATIVE
Influenza B by PCR: NEGATIVE
Resp Syncytial Virus by PCR: NEGATIVE
SARS Coronavirus 2 by RT PCR: NEGATIVE

## 2022-05-06 LAB — BASIC METABOLIC PANEL
Anion gap: 10 (ref 5–15)
BUN: 10 mg/dL (ref 6–20)
CO2: 26 mmol/L (ref 22–32)
Calcium: 8.6 mg/dL — ABNORMAL LOW (ref 8.9–10.3)
Chloride: 99 mmol/L (ref 98–111)
Creatinine, Ser: 0.73 mg/dL (ref 0.44–1.00)
GFR, Estimated: >60 mL/min (ref >60)
Glucose, Bld: 238 mg/dL — ABNORMAL HIGH (ref 70–99)
Potassium: 3.6 mmol/L (ref 3.5–5.1)
Sodium: 135 mmol/L (ref 135–145)

## 2022-05-06 LAB — CBC
HCT: 36.1 % (ref 36.0–46.0)
Hemoglobin: 11.8 g/dL — ABNORMAL LOW (ref 12.0–15.0)
MCH: 29 pg (ref 26.0–34.0)
MCHC: 32.7 g/dL (ref 30.0–36.0)
MCV: 88.7 fL (ref 80.0–100.0)
Platelets: 309 10*3/uL (ref 150–400)
RBC: 4.07 MIL/uL (ref 3.87–5.11)
RDW: 12.4 % (ref 11.5–15.5)
WBC: 7.1 10*3/uL (ref 4.0–10.5)
nRBC: 0 % (ref 0.0–0.2)

## 2022-05-06 LAB — TROPONIN I (HIGH SENSITIVITY)
Troponin I (High Sensitivity): 8 ng/L (ref <18)
Troponin I (High Sensitivity): 9 ng/L (ref <18)

## 2022-05-06 MED ORDER — ACETAMINOPHEN 500 MG PO TABS
1000.0000 mg | ORAL_TABLET | Freq: Once | ORAL | Status: AC
  Administered 2022-05-06: 1000 mg via ORAL
  Filled 2022-05-06 (×2): qty 2

## 2022-05-06 NOTE — Discharge Instructions (Signed)
Return for any problem.  ?

## 2022-05-06 NOTE — ED Triage Notes (Signed)
Patient complains of central chest pain that is exacerbated by inspiration and palpation on center of chest starting three days ago. Patient is alert, oriented, speaking in complete sentences, and in no apparent distress at this time.

## 2022-05-06 NOTE — ED Provider Triage Note (Signed)
Emergency Medicine Provider Triage Evaluation Note  Amy [name] , a 46 y.o. female  was evaluated in triage.  Pt complains of chest pain worse with palpation and deep inspiration x 3 days.  History of NSTEMI.  No history of blood clots.  Denies lower extremity edema.  Review of Systems  Positive: Chest pain Negative: fever  Physical Exam  BP (!) 212/100 (BP Location: Right Arm)   Pulse 93   Temp 98.4 F (36.9 C) (Oral)   Resp 18   SpO2 97%  Gen:   Awake, no distress   Resp:  Normal effort  MSK:   Moves extremities without difficulty  Other:  TTP throughout anterior chest wall  Medical Decision Making  Medically screening exam initiated at 8:53 AM.  Appropriate orders placed.  Amy [name] was informed that the remainder of the evaluation will be completed by another provider, this initial triage assessment does not replace that evaluation, and the importance of remaining in the ED until their evaluation is complete.  Cardiac labs COVID/flu   Jesusita Oka 05/06/22 1696

## 2022-05-06 NOTE — ED Notes (Signed)
Midsternal CP presents with movement and palpation x 3 days.

## 2022-05-06 NOTE — ED Provider Notes (Signed)
[facility] EMERGENCY DEPARTMENT Provider Note   CSN: [id number] Arrival date & time: 05/06/22  0844     History  Chief Complaint  Patient presents with   Chest Pain    Amy [name] is a 46 y.o. female.  46 year old female with prior medical history as detailed below presents for evaluation.  Patient with reported chest discomfort for the last 48 to 72 hours.  Patient complains of constant pain to the anterior chest wall.  This is completely reproduced with movement or palpation of the anterior chest wall.  Patient reports the pain started after a sneezing fit several days ago.  She also lifts heavy objects at work.   She denies any fever.  She denies any shortness of breath.  She does have a history of NSTEMI and is followed by cardiology.  She took Tylenol at home with some improvement in her pain.  The history is provided by the patient and medical records.       Home Medications Prior to Admission medications   Medication Sig Start Date End Date Taking? Authorizing Provider  aspirin 81 MG EC tablet Take 1 tablet (81 mg total) by mouth daily. 01/16/21   Storm Frisk, MD  atorvastatin (LIPITOR) 40 MG tablet Take 1 tablet (40 mg total) by mouth daily. 03/08/22   Storm Frisk, MD  carvedilol (COREG) 12.5 MG tablet Take 1 tablet (12.5 mg total) by mouth 2 (two) times daily with a meal. 03/08/22   Storm Frisk, MD  empagliflozin (JARDIANCE) 10 MG TABS tablet Take 1 tablet (10 mg total) by mouth daily. 03/08/22   Storm Frisk, MD  fexofenadine (ALLEGRA) 180 MG tablet Take 180 mg by mouth daily.    [provider]  fluticasone (FLONASE) 50 MCG/ACT nasal spray Place 2 sprays into both nostrils daily. 03/08/22   Storm Frisk, MD  Multiple Vitamin (MULTIVITAMIN WITH MINERALS) TABS tablet Take 1 tablet by mouth daily.    [provider]  nitroGLYCERIN (NITROSTAT) 0.4 MG SL tablet Place 1 tablet (0.4 mg total) under  the tongue every 5 (five) minutes x 3 doses as needed for chest pain. 06/19/21   Storm Frisk, MD  nortriptyline (PAMELOR) 25 MG capsule Take 1 capsule (25 mg total) by mouth at bedtime to prevent pain the next day takes as needed. 03/08/22   Storm Frisk, MD  spironolactone (ALDACTONE) 25 MG tablet Take 0.5 tablets (12.5 mg total) by mouth daily. 03/08/22   Storm Frisk, MD  valsartan-hydrochlorothiazide (DIOVAN-HCT) 320-25 MG tablet Take 1 tablet by mouth daily. 03/08/22   Storm Frisk, MD      Allergies    Amlodipine, Hydrocodone-acetaminophen, Losartan potassium-hctz, Tetanus antitoxin, and Tetanus toxoids    Review of Systems   Review of Systems  All other systems reviewed and are negative.   Physical Exam Updated Vital Signs BP (!) 181/87 (BP Location: Right Arm)   Pulse 77   Temp 98.1 F (36.7 C) (Oral)   Resp 17   Ht 5\' 2"  (1.575 m)   Wt 101.6 kg   SpO2 98%   BMI 40.97 kg/m  Physical Exam Vitals and nursing note reviewed.  Constitutional:      General: She is not in acute distress.    Appearance: Normal appearance. She is well-developed.  HENT:     Head: Normocephalic and atraumatic.  Eyes:     Conjunctiva/sclera: Conjunctivae normal.     Pupils: Pupils are equal, round, and  reactive to light.  Cardiovascular:     Rate and Rhythm: Normal rate and regular rhythm.     Heart sounds: Normal heart sounds.  Pulmonary:     Effort: Pulmonary effort is normal. No respiratory distress.     Breath sounds: Normal breath sounds.  Chest:     Chest wall: Tenderness present.     Comments: Significantly tender with light palpation of the anterior chest wall.  This appears to completely reproduce the patient's reported discomfort.  No overlying rash or ecchymosis.  No erythema noted.  No crepitus or bony step-off noted. Abdominal:     General: There is no distension.     Palpations: Abdomen is soft.     Tenderness: There is no abdominal tenderness.   Musculoskeletal:        General: No deformity. Normal range of motion.     Cervical back: Normal range of motion and neck supple.  Skin:    General: Skin is warm and dry.  Neurological:     General: No focal deficit present.     Mental Status: She is alert and oriented to person, place, and time.     ED Results / Procedures / Treatments   Labs (all labs ordered are listed, but only abnormal results are displayed) Labs Reviewed  BASIC METABOLIC PANEL - Abnormal; Notable for the following components:      Result Value   Glucose, Bld 238 (*)    Calcium 8.6 (*)    All other components within normal limits  CBC - Abnormal; Notable for the following components:   Hemoglobin 11.8 (*)    All other components within normal limits  RESP PANEL BY RT-PCR (RSV, FLU A&B, COVID)  RVPGX2  TROPONIN I (HIGH SENSITIVITY)  TROPONIN I (HIGH SENSITIVITY)    EKG EKG Interpretation  Date/Time:  Sunday May 06 2022 08:53:43 EST Ventricular Rate:  90 PR Interval:  152 QRS Duration: 84 QT Interval:  372 QTC Calculation: 455 R Axis:   39 Text Interpretation: Normal sinus rhythm Normal ECG When compared with ECG of 17-Dec-2019 16:05, PREVIOUS ECG IS PRESENT Confirmed by Kristine Royal [phone]) on 05/06/2022 9:14:31 AM  Radiology DG Chest 2 View  Result Date: 05/06/2022 CLINICAL DATA:  Anterior chest pain. Coughing and sneezing for 3 days. EXAM: CHEST - 2 VIEW COMPARISON:  12/17/2019. FINDINGS: Normal heart, mediastinum and hila. Clear lungs.  No pleural effusion or pneumothorax. Skeletal structures are unremarkable. IMPRESSION: No active cardiopulmonary disease. Electronically Signed   By: Amie Portland M.D.   On: 05/06/2022 09:36    Procedures Procedures    Medications Ordered in ED Medications  acetaminophen (TYLENOL) tablet 1,000 mg (1,000 mg Oral Given 05/06/22 1004)    ED Course/ Medical Decision Making/ A&P                           Medical Decision Making Amount and/or  Complexity of Data Reviewed Labs: ordered. Radiology: ordered.  Risk OTC drugs.    Medical Screen Complete  This patient presented to the ED with complaint of chest pain.  This complaint involves an extensive number of treatment options. The initial differential diagnosis includes, but is not limited to, chest wall strain, chest pain secondary ACS, unstable angina, metabolic abnormality, pneumothorax, etc.  This presentation is: Acute, Self-Limited, Previously Undiagnosed, Uncertain Prognosis, Complicated, Systemic Symptoms, and Threat to Life/Bodily Function  Patient is presenting with complaint of chest pain.  Described pain is atypical for  likely ACS.  Patient's pain is completely reproduced with palpation of the anterior chest wall.  EKG is without evidence of acute ischemia.  Troponin x 2 is barely detectable.  Patient is reassured by ED evaluation and workup.  She desires discharge home.  Importance of close follow-up is stressed.  Strict return precautions given and understood. Additional history obtained:  External records from outside sources obtained and reviewed including prior ED visits and prior Inpatient records.    Lab Tests:  I ordered and personally interpreted labs.  The pertinent results include: CBC, BMP, COVID, flu, troponin x 2   Imaging Studies ordered:  I ordered imaging studies including chest x-ray I independently visualized and interpreted obtained imaging which showed NAD I agree with the radiologist interpretation.   Cardiac Monitoring:  The patient was maintained on a cardiac monitor.  I personally viewed and interpreted the cardiac monitor which showed an underlying rhythm of: NSR   Medicines ordered:  I ordered medication including Tylenol for pain Reevaluation of the patient after these medicines showed that the patient: improved    Problem List / ED Course:  Chest wall pain   Reevaluation:  After the interventions noted  above, I reevaluated the patient and found that they have: improved  Disposition:  After consideration of the diagnostic results and the patients response to treatment, I feel that the patent would benefit from close outpatient follow-up.          Final Clinical Impression(s) / ED Diagnoses Final diagnoses:  Chest wall pain    Rx / DC Orders ED Discharge Orders     None         Wynetta Fines, MD 05/06/22 [phone]

## 2022-05-25 ENCOUNTER — Other Ambulatory Visit: Payer: Self-pay

## 2022-05-29 ENCOUNTER — Ambulatory Visit: Payer: 59 | Admitting: Pharmacist

## 2022-05-31 ENCOUNTER — Other Ambulatory Visit: Payer: Self-pay

## 2022-06-05 ENCOUNTER — Other Ambulatory Visit: Payer: Self-pay

## 2022-06-08 ENCOUNTER — Other Ambulatory Visit: Payer: Self-pay

## 2022-06-08 NOTE — Progress Notes (Signed)
Contacted patient to follow up regarding patient's BP. I saw her at [facility] on 04/16/2022 where BP were elevated at 162/77 mmHg. However, patient was tearful during our visit.   Today, patient endorses her last BP at her cardiologist was 126/76 mmHg. Reports home BP running 130s/80s. She states she needs to pick up refills on her medications and I encouraged her to do so. She denies chest pain, headache, blurry vision.   Reminded patient of PCP visit on 07/11/2022.   Joseph Art, Pharm.D. PGY-2 Ambulatory Care Pharmacy Resident 06/08/2022 1:43 PM

## 2022-07-11 ENCOUNTER — Other Ambulatory Visit: Payer: Self-pay

## 2022-07-11 ENCOUNTER — Ambulatory Visit: Payer: BC Managed Care – PPO | Attending: Critical Care Medicine | Admitting: Critical Care Medicine

## 2022-07-11 ENCOUNTER — Encounter: Payer: Self-pay | Admitting: Critical Care Medicine

## 2022-07-11 VITALS — BP 165/110 | HR 90 | Temp 98.3°F | Resp 16 | Ht 62.0 in | Wt 220.0 lb

## 2022-07-11 DIAGNOSIS — Z6841 Body Mass Index (BMI) 40.0 and over, adult: Secondary | ICD-10-CM

## 2022-07-11 DIAGNOSIS — E1159 Type 2 diabetes mellitus with other circulatory complications: Secondary | ICD-10-CM

## 2022-07-11 DIAGNOSIS — I10 Essential (primary) hypertension: Secondary | ICD-10-CM

## 2022-07-11 DIAGNOSIS — I2510 Atherosclerotic heart disease of native coronary artery without angina pectoris: Secondary | ICD-10-CM | POA: Diagnosis not present

## 2022-07-11 DIAGNOSIS — E119 Type 2 diabetes mellitus without complications: Secondary | ICD-10-CM

## 2022-07-11 LAB — POCT GLYCOSYLATED HEMOGLOBIN (HGB A1C): HbA1c, POC (controlled diabetic range): 7.1 % — AB (ref 0.0–7.0)

## 2022-07-11 MED ORDER — SPIRONOLACTONE 25 MG PO TABS
25.0000 mg | ORAL_TABLET | Freq: Every day | ORAL | 2 refills | Status: DC
  Filled 2022-07-11: qty 30, 30d supply, fill #0

## 2022-07-11 MED ORDER — EMPAGLIFLOZIN 10 MG PO TABS
10.0000 mg | ORAL_TABLET | Freq: Every day | ORAL | 3 refills | Status: DC
  Filled 2022-07-11 – 2022-12-27 (×3): qty 30, 30d supply, fill #0
  Filled 2023-02-02 – 2023-02-07 (×2): qty 30, 30d supply, fill #1
  Filled 2023-04-04 – 2023-05-08 (×2): qty 30, 30d supply, fill #2

## 2022-07-11 MED ORDER — CARVEDILOL 25 MG PO TABS
25.0000 mg | ORAL_TABLET | Freq: Two times a day (BID) | ORAL | 2 refills | Status: DC
  Filled 2022-07-11 – 2022-12-27 (×3): qty 60, 30d supply, fill #0
  Filled 2023-02-02 – 2023-02-07 (×2): qty 60, 30d supply, fill #1
  Filled 2023-04-04 – 2023-05-08 (×2): qty 60, 30d supply, fill #2

## 2022-07-11 MED ORDER — VALSARTAN-HYDROCHLOROTHIAZIDE 320-25 MG PO TABS
1.0000 | ORAL_TABLET | Freq: Every day | ORAL | 3 refills | Status: DC
  Filled 2022-07-11 – 2022-12-27 (×3): qty 30, 30d supply, fill #0
  Filled 2023-02-02 – 2023-02-07 (×2): qty 30, 30d supply, fill #1
  Filled 2023-04-04 – 2023-05-08 (×2): qty 30, 30d supply, fill #2

## 2022-07-11 MED ORDER — NORTRIPTYLINE HCL 25 MG PO CAPS
25.0000 mg | ORAL_CAPSULE | Freq: Every day | ORAL | 2 refills | Status: AC
  Filled 2022-07-11 – 2022-12-27 (×3): qty 30, 30d supply, fill #0
  Filled 2023-02-02 – 2023-02-07 (×2): qty 30, 30d supply, fill #1
  Filled 2023-04-04 – 2023-05-08 (×2): qty 30, 30d supply, fill #2

## 2022-07-11 NOTE — Patient Instructions (Addendum)
You will be referred to the advanced hypertension clinic with Dr. Oval Linsey  Increase carvedilol to 25 mg twice daily you can take 2 of the 12-1/2 but the refill is a single 25 mg  Increase Aldactone to 25 mg daily take a whole tablet daily  No change in valsartan HCT  Labs include metabolic panel and cholesterol  Return to see Dr. Joya Gaskins 3 weeks for blood pressure check  Rosana Hoes our licensed social worker will call you for stress management counseling          Advice for Weight Management   -For most of [place] the best way to lose weight is by diet management. Generally speaking, diet management means consuming less calories intentionally which over time brings about progressive weight loss.  This can be achieved more effectively by avoiding ultra processed carbohydrates, processed meats, unhealthy fats.    It is critically important to know your numbers: how much calorie you are consuming and how much calorie you need. More importantly, our carbohydrates sources should be unprocessed naturally occurring  complex starch food items.  It is always important to balance nutrition also by  appropriate intake of proteins (mainly plant-based), healthy fats/oils, plenty of fruits and vegetables.    -The American College of Lifestyle Medicine (ACL M) recommends nutrition derived mostly from Whole Food, Plant Predominant Sources example an apple instead of applesauce or apple pie. Eat Plenty of vegetables, Mushrooms, fruits, Legumes, Whole Grains, Nuts, seeds in lieu of processed meats, processed snacks/pastries red meat, poultry, eggs.  Use only water or unsweetened tea for hydration.  The College also recommends the need to stay away from risky substances including alcohol, smoking; obtaining 7-9 hours of restorative sleep, at least 150 minutes of moderate intensity exercise weekly, importance of healthy social connections, and being mindful of stress and seek help when it is overwhelming.      -Sticking to a routine mealtime to eat 3 meals a day and avoiding unnecessary snacks is shown to have a big role in weight control. Under normal circumstances, the only time we burn stored energy is when we are hungry, so allow  some hunger to take place- hunger means no food between appropriate meal times, only water.  It is not advisable to starve.    -It is better to avoid simple carbohydrates including: Cakes, Sweet Desserts, Ice Cream, Soda (diet and regular), Sweet Tea, Candies, Chips, Cookies, Store Bought Juices, Alcohol in Excess of  1-2 drinks a day, Lemonade,  Artificial Sweeteners, Doughnuts, Coffee Creamers, "Sugar-free" Products, etc, etc.  This is not a complete list...Marland Kitchen.    -Consulting with certified diabetes educators is proven to provide you with the most accurate and current information on diet.  Also, you may be  interested in discussing diet options/exchanges , we can schedule a visit with Jearld Fenton, RDN, CDE for individualized nutrition education.   -Exercise: If you are able: 30 -60 minutes a day ,4 days a week, or 150 minutes of moderate intensity exercise weekly.    The longer the better if tolerated.  Combine stretch, strength, and aerobic activities.  If you were told in the past that you have high risk for cardiovascular diseases, or if you are currently symptomatic, you may seek evaluation by your heart doctor prior to initiating moderate to intense exercise programs.  Additional Care Considerations for Diabetes/Prediabetes     -Diabetes  is a chronic disease.  The most important care consideration is regular follow-up with your diabetes care provider with the goal being avoiding or delaying its complications and to take advantage of advances in medications and technology.  If appropriate actions are taken early enough, type 2 diabetes can even be reversed.  Seek information from the right source.   - Whole Food, Plant Predominant  Nutrition is highly recommended: Eat Plenty of vegetables, Mushrooms, fruits, Legumes, Whole Grains, Nuts, seeds in lieu of processed meats, processed snacks/pastries red meat, poultry, eggs as recommended by [employer] of  Lifestyle Medicine (ACLM).   -Type 2 diabetes is known to coexist with other important comorbidities such as high blood pressure and high cholesterol.  It is critical to control not only the diabetes but also the high blood pressure and high cholesterol to minimize and delay the risk of complications including coronary artery disease, stroke, amputations, blindness, etc.  The good news is that this diet recommendation for type 2 diabetes is also very helpful for managing high cholesterol and high blood blood pressure.   - Studies showed that people with diabetes will benefit from a class of medications known as ACE inhibitors and statins.  Unless there are specific reasons not to be on these medications, the standard of care is to consider getting one from these groups of medications at an optimal doses.  These medications are generally considered safe and proven to help protect the heart and the kidneys.     - People with diabetes are encouraged to initiate and maintain regular follow-up with eye doctors, foot doctors, dentists , and if necessary heart and kidney doctors.      - It is highly recommended that people with diabetes quit smoking or stay away from smoking, and get yearly  flu vaccine and pneumonia vaccine at least every 5 years.  See above for additional recommendations on exercise, sleep, stress management , and healthy social connections.

## 2022-07-11 NOTE — Assessment & Plan Note (Signed)
The following Lifestyle Medicine recommendations according to [facility]) were discussed and offered to patient who agrees to start the journey:  A. Whole Foods, Plant-based plate comprising of fruits and vegetables, plant-based proteins, whole-grain carbohydrates was discussed in detail with the patient.   A list for source of those nutrients were also provided to the patient.  Patient will use only water or unsweetened tea for hydration. B.  The need to stay away from risky substances including alcohol, smoking; obtaining 7 to 9 hours of restorative sleep, at least 150 minutes of moderate intensity exercise weekly, the importance of healthy social connections,  and stress reduction techniques were discussed. C.  A full color page of  Calorie density of various food groups per pound showing examples of each food groups was provided to the patient.

## 2022-07-11 NOTE — Progress Notes (Unsigned)
Established Patient Office Visit  Subjective   Patient ID: Amy [name], female    DOB: Dec 06, 1975  Age: 47 y.o. MRN: [id number]  Cc primary care follow-up hypertension  11/14/21 Amy [name] is a well appearing 47 year old female with a history of coronary artery disease, diabetes, and hypertension presents for follow up. No acute concerns today. Hemoglobin A1C 6.6 (08/2021). She states she checks her sugars twice a day and they usually run between 120-130. Adherent to her medications. Patient states she is doing her best to increase her activity level by brisk walking but struggles to stay disciplined due to the demand of her work. She is being proactive with her diet and is incorporating more vegetables and fresh fruits daily. Denies alcohol and substance abuse.  Blood pressure today 155/92. Last few office visits with other providers have also been elevated. In further questioning her about her diet we discussed salt intake. She admits to not monitoring her intake and often times will use sauces or dressings that she believes maybe increasing her blood pressure.   7/24 Patient is here for short-term follow-up on arrival blood pressure quite elevated 160/100 but she states at home it runs in the 142/95 range.  Blood sugar though is much improved 126 and she has lost some weight.  Patient is on the valsartan HCT and carvedilol.  A1c at the last visit was 6.6 and we continued with Jardiance daily.  Today the A1c is at 7.5  The patient also received a lifestyle medicine handout has been trying to change her diet and eat healthier food choices.   8/15 Patient seen for return visit on arrival blood pressure 161/96.  She states at home she does occasionally get 139/86.  When she was traveling to the [place] her blood pressure was lower.  She is trying to eat healthier.  She is started an exercise program.  She is taking the Aldactone daily.  There is been a slight reduction in  blood pressure but not substantial.  She is not yet at goal.  Patient maintains carvedilol 25 mg twice daily, Aldactone 25 mg daily, valsartan HCT maximum dose daily.  Note patient cannot tolerate amlodipine from hives Blood sugar is good today 139  9/5 The patient is seen in return follow-up on arrival blood pressure is 136/85.  She is seen by clinical pharmacy as documented below last week. We had added hydralazine 3 times daily but she was not taking the medication at that visit.  Clinical pharmacy also found her to be bradycardic and reduced her carvedilol to 12 and half milligram twice daily and did restart spironolactone at a lower dose of 12.5 mg daily.  Patient maintains valsartan HCT 320/25. Patient's been trying to follow a lifestyle medicine approach and is lost some weight and is exercising more.  She is more cautious about the salt that she is adding to her diet.  She has no other complaints.  She does agree to and did receive a flu vaccine this visit.  10/12 Patient seen in return follow-up blood pressure on arrival is elevated still she has been taking her medications but on lots of stress.  Patient blood pressure rechecked was more normal 128/86.  She is taking multiple medications.  She is stressed because her daughter is pregnant with preeclampsia.  She is not exercising like she should.  She is trying to eat healthier   07/11/22 Saw Luke twice for HTN since Oct OV Expand All Collapse All  S:    No chief complaint on file.   Amy [name] is a 47 y.o. female who presents for hypertension evaluation, education, and management.  PMH is significant for HTN, coronary artery disease, T2DM.  Patient was referred and last seen by Primary Care Provider, Dr. Joya Gaskins, on 03/08/2022. Last seen by pharmacy clinic on 03/16/2022.    Today, patient arrives in good spirits and presents without assistance. Denies dizziness, headache, blurred vision, swelling. Of note, patient is very  tearful upon arrival. States she knows her BP will be elevated today as her and her husband have been arguing. She has taken all of her BP medications today. BP at cardiology appointment on 04/05/2022 was 126/77 mmHg.    Patient reports hypertension is longstanding.    Family/Social history:  Fhx: diabetes, HTN, HF Tobacco: never smoker   Current antihypertensives include: Valsartan/HCTZ 344m-25mg once daily, Carvedilol 12.545mtwice daily, spironolactone 12.5 mg daily.    Antihypertensives tried in the past include: Amlodipine (hives), Losartan (dizziness)    Reported home BP readings: 120-130s/70s   Patient reported dietary habits:  -Patient reports adherence with salt restrictions and doing intermittent fasting. -Caffeine: doesn't drink coffee or soda. Only drinks water.    Patient-reported exercise habits: -Patient goes for a walk about 3-4 days a week   O:   Vitals:   04/16/22 0917 BP: (!) 162/77     Last 3 Office BP readings:  BP Readings from Last 3 Encounters: 04/16/22 (!) 162/77 04/05/22 126/72 03/16/22 (!) 144/83   BMET  Labs (Brief)   Component Value Date/Time   NA 134 01/30/2022 1037   K 4.5 01/30/2022 1037   CL 98 01/30/2022 1037   CO2 25 01/30/2022 1037   GLUCOSE 116 (H) 01/30/2022 1037   GLUCOSE 165 (H) 11/02/2019 0651   BUN 9 01/30/2022 1037   CREATININE 0.76 01/30/2022 1037   CALCIUM 9.6 01/30/2022 1037   GFRNONAA 89 02/25/2020 1145   GFRAA 102 02/25/2020 1145    Renal function: CrCl cannot be calculated (Patient's most recent lab result is older than the maximum 21 days allowed.).   Clinical ASCVD: No  The ASCVD Risk score (Arnett DK, et al., 2019) failed to calculate for the following reasons:   The patient has a prior MI or stroke diagnosis   A/P: Hypertension is longstanding currently above goal on current medications. BP goal < 130/80 mmHg. Medication adherence appears suboptimal. Patient under a considerable amount of stress today  which is likely the cause of her elevated BP.  -Continue current regimen given home BP readings and BP at cardiology visit were at goal.  -Counseled on lifestyle modifications for blood pressure control including reduced dietary sodium, increased exercise, adequate sleep. -Encouraged patient to check BP at home and bring log of readings to next visit. Counseled on proper use of home BP cuff.   Cards OV 03/2022 1. NSTEMI-thought to be related to vasospasm (10/2019) 2. Type 2 diabetes 3. Dyslipidemia 4. Hypertension 5. Morbid obesity   PLAN: 1.   Ms. DaMliss Fritzontinues to do well with interval resolution of her right arm pain.  She is working on weight loss and dietary changes.  Her cholesterols come down accordingly.  I would recommend staying on her current dose of the atorvastatin.  Would repeat lipids in 3 to 6 months.  Her target LDL is less than 70 and if she is not able to achieve that with the lifestyle modifications, would consider adding ezetimibe to her statin.  Plan follow-up  with me in 6 months or sooner as necessary.  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   Seasonal allergic rhinitis due to pollen 06/14/2021   Essential hypertension 01/04/2020   Type 2 diabetes mellitus without complication, without long-term current use of insulin ([place]) 01/04/2020   Class 3 severe obesity due to excess calories with serious comorbidity and body mass index (BMI) of 40.0 to 44.9 in adult [facility]) 01/04/2020   Coronary artery disease due to type 2 diabetes mellitus ([place]) 01/04/2020   Dental caries 01/04/2020   Past Medical History:  Diagnosis Date   Anemia    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   Coronary artery disease    Diabetes mellitus without complication (HCC)    Excessive or frequent menstruation 09/10/2012   Hyperlipidemia    Hypertension    NSTEMI (non-ST elevated myocardial  infarction) ([place]) 10/30/2019   PONV (postoperative nausea and vomiting)    with ablation only   Past Surgical History:  Procedure Laterality Date   CHOLECYSTECTOMY  2006   CORONARY ANGIOPLASTY     DILITATION & CURRETTAGE/HYSTROSCOPY WITH HYDROTHERMAL ABLATION N/A 07/10/2013   Procedure: DILATATION & CURETTAGE/HYSTEROSCOPY WITH HYDROTHERMAL ABLATION;  Surgeon: Shelly Bombard, MD;  Location: [facility];  Service: Gynecology;  Laterality: N/A;   LEFT HEART CATH AND CORONARY ANGIOGRAPHY N/A 11/02/2019   Procedure: LEFT HEART CATH AND CORONARY ANGIOGRAPHY;  Surgeon: Sherren Mocha, MD;  Location: [facility];  Service: Cardiovascular;  Laterality: N/A;   TUBAL LIGATION  1997   WISDOM TOOTH EXTRACTION  1998   Social History   Tobacco Use   Smoking status: Never   Smokeless tobacco: Never  Vaping Use   Vaping Use: Never used  Substance Use Topics   Alcohol use: No   Drug use: No   Social History   Socioeconomic History   Marital status: Significant Other    Spouse name: McCarol Jones   Number of children: 2   Years of education: Not on file   Highest education level: Some college, no degree  Occupational History   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  Does not routinely exercise.  In school @ [employer] for nsg.         Lives in a one story home    Right Handed    Social Determinants of Health   Financial Resource Strain: Not on file  Food Insecurity: Not on file  Transportation Needs: No Transportation Needs (08/07/2018)   PRAPARE - Hydrologist (Medical): No    Lack of Transportation  (Non-Medical): No  Physical Activity: Not on file  Stress: Not on file  Social Connections: Not on file  Intimate Partner Violence: Not on file   Family Status  Relation Name Status   Mother  Alive   Father  Alive   Mat Aunt  (Not Specified)   Annamarie Major  (Not Specified)   Other  (Not Specified)  Other  (Not Specified)   Other  (Not Specified)   Other  (Not Specified)   Neg Hx  (Not Specified)   Family History  Problem Relation Age of Onset   Breast cancer Mother    Hypertension Mother    Hypertension Father    Heart failure Maternal Aunt    Leukemia Paternal Uncle    COPD Other    Hypertension Other    Diabetes Other    Cancer Other    Colon cancer Neg Hx    Colon polyps Neg Hx    Esophageal cancer Neg Hx    Rectal cancer Neg Hx    Stomach cancer Neg Hx    Allergies  Allergen Reactions   Amlodipine Hives   Hydrocodone-Acetaminophen Hives and Itching    And itching Itching and hives   Losartan Potassium-Hctz Other (See Comments)    Dizziness   Tetanus Antitoxin     Arm swelling size of a softball.    Tetanus Toxoids     Arm swelling size of a softball.     Review of Systems  Constitutional: Negative.  Negative for chills, diaphoresis, fever, malaise/fatigue and weight loss.  HENT: Negative.  Negative for congestion, hearing loss, nosebleeds, sore throat and tinnitus.   Eyes: Negative.  Negative for blurred vision, photophobia and redness.  Respiratory: Negative.  Negative for cough, hemoptysis, sputum production, shortness of breath, wheezing and stridor.   Cardiovascular: Negative.  Negative for chest pain, palpitations, orthopnea, claudication, leg swelling and PND.  Gastrointestinal: Negative.  Negative for abdominal pain, blood in stool, constipation, diarrhea, heartburn, nausea and vomiting.  Genitourinary: Negative.  Negative for dysuria, flank pain, frequency, hematuria and urgency.  Musculoskeletal: Negative.  Negative for back pain, falls, joint pain,  myalgias and neck pain.  Skin: Negative.  Negative for itching and rash.  Neurological: Negative.  Negative for dizziness, tingling, tremors, sensory change, speech change, focal weakness, seizures, loss of consciousness, weakness and headaches.  Endo/Heme/Allergies: Negative.  Negative for environmental allergies and polydipsia. Does not bruise/bleed easily.  Psychiatric/Behavioral:  Negative for depression, memory loss, substance abuse and suicidal ideas. The patient is not nervous/anxious and does not have insomnia.       Objective:     There were no vitals taken for this visit. BP Readings from Last 3 Encounters:  05/06/22 (!) 151/67  04/16/22 (!) 162/77  04/05/22 126/72   Wt Readings from Last 3 Encounters:  05/06/22 224 lb (101.6 kg)  04/05/22 225 lb 3.2 oz (102.2 kg)  03/08/22 223 lb 3.2 oz (101.2 kg)      Physical Exam Vitals reviewed.  Constitutional:      Appearance: Normal appearance. She is well-developed. She is obese. She is not diaphoretic.  HENT:     Head: Normocephalic and atraumatic.     Right Ear: Tympanic membrane normal.     Left Ear: Tympanic membrane normal.     Nose: Nose normal. No nasal deformity, septal deviation, mucosal edema or rhinorrhea.     Right Sinus: No maxillary sinus tenderness or frontal sinus tenderness.     Left Sinus: No maxillary sinus tenderness or frontal sinus tenderness.     Mouth/Throat:     Pharynx: No oropharyngeal exudate.     Comments: Refused oral physical exam Eyes:     General: No scleral icterus.    Conjunctiva/sclera: Conjunctivae normal.     Pupils: Pupils are equal, round, and reactive to light.  Neck:     Thyroid: No thyromegaly.  Vascular: No carotid bruit or JVD.     Trachea: Trachea normal. No tracheal tenderness or tracheal deviation.  Cardiovascular:     Rate and Rhythm: Normal rate and regular rhythm.     Chest Wall: PMI is not displaced.     Pulses: Normal pulses. No decreased pulses.     Heart  sounds: Normal heart sounds, S1 normal and S2 normal. Heart sounds not distant. No murmur heard.    No systolic murmur is present.     No diastolic murmur is present.     No friction rub. No gallop. No S3 or S4 sounds.  Pulmonary:     Effort: Pulmonary effort is normal. No tachypnea, accessory muscle usage or respiratory distress.     Breath sounds: Normal breath sounds. No stridor. No decreased breath sounds, wheezing, rhonchi or rales.  Chest:     Chest wall: No tenderness.  Abdominal:     General: Abdomen is flat. Bowel sounds are normal. There is no distension.     Palpations: Abdomen is soft. Abdomen is not rigid.     Tenderness: There is no abdominal tenderness. There is no guarding or rebound.  Musculoskeletal:        General: Normal range of motion.     Cervical back: Normal range of motion and neck supple. No edema, erythema or rigidity. No muscular tenderness. Normal range of motion.  Lymphadenopathy:     Head:     Right side of head: No submental or submandibular adenopathy.     Left side of head: No submental or submandibular adenopathy.     Cervical: No cervical adenopathy.  Skin:    General: Skin is warm and dry.     Coloration: Skin is not pale.     Findings: No rash.     Nails: There is no clubbing.  Neurological:     General: No focal deficit present.     Mental Status: She is alert and oriented to person, place, and time.     Sensory: No sensory deficit.     Motor: No pronator drift.  Psychiatric:        Mood and Affect: Mood normal.        Speech: Speech normal.        Behavior: Behavior normal.      No results found for any visits on 07/11/22.   Last CBC Lab Results  Component Value Date   WBC 7.1 05/06/2022   HGB 11.8 (L) 05/06/2022   HCT 36.1 05/06/2022   MCV 88.7 05/06/2022   MCH 29.0 05/06/2022   RDW 12.4 05/06/2022   PLT 309 [id number]   Last metabolic panel Lab Results  Component Value Date   GLUCOSE 238 (H) 05/06/2022   NA 135  05/06/2022   K 3.6 05/06/2022   CL 99 05/06/2022   CO2 26 05/06/2022   BUN 10 05/06/2022   CREATININE 0.73 05/06/2022   EGFR 97 01/30/2022   CALCIUM 8.6 (L) 05/06/2022   PROT 7.9 04/12/2021   ALBUMIN 4.0 04/12/2021   LABGLOB 3.9 04/12/2021   AGRATIO 1.0 (L) 04/12/2021   BILITOT 0.2 04/12/2021   ALKPHOS 91 04/12/2021   AST 15 04/12/2021   ALT 8 04/12/2021   ANIONGAP 10 05/06/2022   Last lipids Lab Results  Component Value Date   CHOL 160 01/25/2022   HDL 42 01/25/2022   LDLCALC 102 (H) 01/25/2022   TRIG 83 01/25/2022   CHOLHDL 3.8 01/25/2022   Last hemoglobin A1c Lab Results  Component Value Date   HGBA1C 7.5 (A) 12/18/2021   Last thyroid functions Lab Results  Component Value Date   TSH 1.550 03/16/2022   T4TOTAL 8.7 03/16/2022   Last vitamin D No results found for: "25OHVITD2", "25OHVITD3", "VD25OH" Last vitamin B12 and Folate No results found for: "VITAMINB12", "FOLATE"  The ASCVD Risk score (Arnett DK, et al., 2019) failed to calculate for the following reasons:   The patient has a prior MI or stroke diagnosis    Assessment & Plan:   Problem List Items Addressed This Visit   None No follow-ups on file.    Asencion Noble, MD

## 2022-07-12 ENCOUNTER — Other Ambulatory Visit: Payer: Self-pay

## 2022-07-12 ENCOUNTER — Other Ambulatory Visit: Payer: Self-pay | Admitting: Critical Care Medicine

## 2022-07-12 LAB — BMP8+EGFR
BUN/Creatinine Ratio: 13 (ref 9–23)
BUN: 9 mg/dL (ref 6–24)
CO2: 23 mmol/L (ref 20–29)
Calcium: 9.4 mg/dL (ref 8.7–10.2)
Chloride: 98 mmol/L (ref 96–106)
Creatinine, Ser: 0.7 mg/dL (ref 0.57–1.00)
Glucose: 129 mg/dL — ABNORMAL HIGH (ref 70–99)
Potassium: 3.7 mmol/L (ref 3.5–5.2)
Sodium: 137 mmol/L (ref 134–144)
eGFR: 107 mL/min/{1.73_m2} (ref >59)

## 2022-07-12 LAB — LIPID PANEL
Chol/HDL Ratio: 3.3 ratio (ref 0.0–4.4)
Cholesterol, Total: 174 mg/dL (ref 100–199)
HDL: 53 mg/dL (ref >39)
LDL Chol Calc (NIH): 109 mg/dL — ABNORMAL HIGH (ref 0–99)
Triglycerides: 60 mg/dL (ref 0–149)
VLDL Cholesterol Cal: 12 mg/dL (ref 5–40)

## 2022-07-12 MED ORDER — EZETIMIBE 10 MG PO TABS
10.0000 mg | ORAL_TABLET | Freq: Every day | ORAL | 3 refills | Status: AC
  Filled 2022-07-12 – 2022-12-27 (×3): qty 30, 30d supply, fill #0
  Filled 2023-02-02 – 2023-02-07 (×2): qty 30, 30d supply, fill #1
  Filled 2023-04-04 – 2023-05-08 (×2): qty 30, 30d supply, fill #2

## 2022-07-12 NOTE — Assessment & Plan Note (Signed)
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

## 2022-07-12 NOTE — Assessment & Plan Note (Signed)
Hemoglobin A1c is close to goal at 7.1 again it would improve if she follows more of a lifestyle medicine approach Plan to continue Jardiance for now

## 2022-07-12 NOTE — Progress Notes (Signed)
Let patient know blood sugar was only mildly elevated kidneys normal cholesterol yet not at goal I am adding a second cholesterol pill that her cardiologist recommended called Zetia 1 daily this was sent to pharmacy

## 2022-07-16 ENCOUNTER — Telehealth: Payer: Self-pay

## 2022-07-16 NOTE — Telephone Encounter (Signed)
-----   Message from Amy Stain, MD sent at 07/12/2022  5:44 AM EST ----- Let patient know blood sugar was only mildly elevated kidneys normal cholesterol yet not at goal I am adding a second cholesterol pill that her cardiologist recommended called Zetia 1 daily this was sent to pharmacy

## 2022-07-16 NOTE — Telephone Encounter (Signed)
Pt was called and is aware of results, DOB was confirmed.  ?

## 2022-07-18 ENCOUNTER — Other Ambulatory Visit: Payer: Self-pay

## 2022-07-25 ENCOUNTER — Other Ambulatory Visit: Payer: Self-pay

## 2022-08-09 ENCOUNTER — Ambulatory Visit: Payer: 59 | Admitting: Critical Care Medicine

## 2022-08-14 NOTE — Progress Notes (Unsigned)
Established Patient Office Visit  Subjective   Patient ID: Amy [name], female    DOB: [birth year]  Age: 47 y.o. MRN: [id number]  Cc primary care follow-up hypertension  11/14/21 [name] is a well appearing 47 year old female with a history of coronary artery disease, diabetes, and hypertension presents for follow up. No acute concerns today. Hemoglobin A1C 6.6 (08/2021). She states she checks her sugars twice a day and they usually run between 120-130. Adherent to her medications. Patient states she is doing her best to increase her activity level by brisk walking but struggles to stay disciplined due to the demand of her work. She is being proactive with her diet and is incorporating more vegetables and fresh fruits daily. Denies alcohol and substance abuse.  Blood pressure today 155/92. Last few office visits with other providers have also been elevated. In further questioning her about her diet we discussed salt intake. She admits to not monitoring her intake and often times will use sauces or dressings that she believes maybe increasing her blood pressure.   7/24 Patient is here for short-term follow-up on arrival blood pressure quite elevated 160/100 but she states at home it runs in the 142/95 range.  Blood sugar though is much improved 126 and she has lost some weight.  Patient is on the valsartan HCT and carvedilol.  A1c at the last visit was 6.6 and we continued with Jardiance daily.  Today the A1c is at 7.5  The patient also received a lifestyle medicine handout has been trying to change her diet and eat healthier food choices.   8/15 Patient seen for return visit on arrival blood pressure 161/96.  She states at home she does occasionally get 139/86.  When she was traveling to the [place] her blood pressure was lower.  She is trying to eat healthier.  She is started an exercise program.  She is taking the Aldactone daily.  There is been a slight reduction in  blood pressure but not substantial.  She is not yet at goal.  Patient maintains carvedilol 25 mg twice daily, Aldactone 25 mg daily, valsartan HCT maximum dose daily.  Note patient cannot tolerate amlodipine from hives Blood sugar is good today 139  9/5 The patient is seen in return follow-up on arrival blood pressure is 136/85.  She is seen by clinical pharmacy as documented below last week. We had added hydralazine 3 times daily but she was not taking the medication at that visit.  Clinical pharmacy also found her to be bradycardic and reduced her carvedilol to 12 and half milligram twice daily and did restart spironolactone at a lower dose of 12.5 mg daily.  Patient maintains valsartan HCT 320/25. Patient's been trying to follow a lifestyle medicine approach and is lost some weight and is exercising more.  She is more cautious about the salt that she is adding to her diet.  She has no other complaints.  She does agree to and did receive a flu vaccine this visit.  10/12 Patient seen in return follow-up blood pressure on arrival is elevated still she has been taking her medications but on lots of stress.  Patient blood pressure rechecked was more normal 128/86.  She is taking multiple medications.  She is stressed because her daughter is pregnant with preeclampsia.  She is not exercising like she should.  She is trying to eat healthier   07/11/22 Patient seen in return follow-up for hypertension unfortunately on arrival blood pressure 198/120 repeat  165/110.  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  She has been compliant with her medications.  She has not been following a lifestyle approach with minimal change in diet and no opportunity to exercise other than walking around on her job.  The patient had recently seen cardiology with normal-appearing blood pressure numbers she does not measure her blood  pressure at home as we requested.  Cardiology wanted lipid follow-up she has had elevated lipids with the coronary syndrome in the past.  Patient did see our clinical pharmacist twice since I last seen her last visit being in October at that time even then blood pressure was elevated.  No medication changes were made. Below is a cardiology visit from November  cards OV 03/2022 1. NSTEMI-thought to be related to vasospasm (10/2019) 2. Type 2 diabetes 3. Dyslipidemia 4. Hypertension 5. Morbid obesity   PLAN: 1.   Ms. Mliss Fritz continues to do well with interval resolution of her right arm pain.  She is working on weight loss and dietary changes.  Her cholesterols come down accordingly.  I would recommend staying on her current dose of the atorvastatin.  Would repeat lipids in 3 to 6 months.  Her target LDL is less than 70 and if she is not able to achieve that with the lifestyle modifications, would consider adding ezetimibe to her statin.  Plan follow-up with me in 6 months or sooner as necessary. Patient then went to the emergency room in December for chest pain syndrome coronary disease ruled out likely stress  07/2022 foot    Patient Active Problem List   Diagnosis Date Noted   Pronator syndrome, right 07/11/2021   Seasonal allergic rhinitis due to pollen 06/14/2021   Essential hypertension 01/04/2020   Type 2 diabetes mellitus without complication, without long-term current use of insulin ([place]) 01/04/2020   Class 3 severe obesity due to excess calories with serious comorbidity and body mass index (BMI) of 40.0 to 44.9 in adult [facility]) 01/04/2020   Coronary artery disease due to type 2 diabetes mellitus ([place]) 01/04/2020   Dental caries 01/04/2020   Past Medical History:  Diagnosis Date   Anemia    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   Coronary artery disease    Diabetes mellitus without complication (HCC)    Excessive or frequent menstruation 09/10/2012    Hyperlipidemia    Hypertension    NSTEMI (non-ST elevated myocardial infarction) ([place]) 10/30/2019   PONV (postoperative nausea and vomiting)    with ablation only   Past Surgical History:  Procedure Laterality Date   CHOLECYSTECTOMY  2006   CORONARY ANGIOPLASTY     DILITATION & CURRETTAGE/HYSTROSCOPY WITH HYDROTHERMAL ABLATION N/A 07/10/2013   Procedure: DILATATION & CURETTAGE/HYSTEROSCOPY WITH HYDROTHERMAL ABLATION;  Surgeon: Shelly Bombard, MD;  Location: [facility];  Service: Gynecology;  Laterality: N/A;   LEFT HEART CATH AND CORONARY ANGIOGRAPHY N/A 11/02/2019   Procedure: LEFT HEART CATH AND CORONARY ANGIOGRAPHY;  Surgeon: Sherren Mocha, MD;  Location: [facility];  Service: Cardiovascular;  Laterality: N/A;   TUBAL LIGATION  1997   WISDOM TOOTH EXTRACTION  1998   Social History   Tobacco Use   Smoking status: Never   Smokeless tobacco: Never  Vaping Use   Vaping Use: Never used  Substance Use Topics   Alcohol use: No   Drug use: No  Social History   Socioeconomic History   Marital status: Significant Other    Spouse name: Engineer, maintenance   Number of children: 2   Years of education: Not on file   Highest education level: Some college, no degree  Occupational History   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  Does not routinely exercise.  In school @ [employer] for nsg.         Lives in a one story home    Right Handed    Social Determinants of Health   Financial Resource Strain: Not on file  Food Insecurity: Not on file  Transportation Needs: No Transportation Needs (08/07/2018)   PRAPARE -  Hydrologist (Medical): No    Lack of Transportation (Non-Medical): No  Physical Activity: Not on file  Stress: Not on file  Social Connections: Not on file  Intimate Partner Violence: Not on file   Family Status  Relation Name Status   Mother  Alive   Father  Alive   Mat Aunt  (Not Specified)   Annamarie Major  (Not Specified)   Other  (Not Specified)   Other  (Not Specified)   Other  (Not Specified)   Other  (Not Specified)   Neg Hx  (Not Specified)   Family History  Problem Relation Age of Onset   Breast cancer Mother    Hypertension Mother    Hypertension Father    Heart failure Maternal Aunt    Leukemia Paternal Uncle    COPD Other    Hypertension Other    Diabetes Other    Cancer Other    Colon cancer Neg Hx    Colon polyps Neg Hx    Esophageal cancer Neg Hx    Rectal cancer Neg Hx    Stomach cancer Neg Hx    Allergies  Allergen Reactions   Amlodipine Hives   Hydrocodone-Acetaminophen Hives and Itching    And itching Itching and hives   Losartan Potassium-Hctz Other (See Comments)    Dizziness   Tetanus Antitoxin     Arm swelling size of a softball.    Tetanus Toxoids     Arm swelling size of a softball.     Review of Systems  Constitutional: Negative.  Negative for chills, diaphoresis, fever, malaise/fatigue and weight loss.  HENT: Negative.  Negative for congestion, hearing loss, nosebleeds, sore throat and tinnitus.   Eyes: Negative.  Negative for blurred vision, photophobia and redness.  Respiratory: Negative.  Negative for cough, hemoptysis, sputum production, shortness of breath, wheezing and stridor.   Cardiovascular: Negative.  Negative for chest pain, palpitations, orthopnea, claudication, leg swelling and PND.  Gastrointestinal: Negative.  Negative for abdominal pain, blood in stool, constipation, diarrhea, heartburn, nausea and vomiting.  Genitourinary: Negative.  Negative for dysuria, flank pain, frequency, hematuria  and urgency.  Musculoskeletal: Negative.  Negative for back pain, falls, joint pain, myalgias and neck pain.  Skin: Negative.  Negative for itching and rash.  Neurological: Negative.  Negative for dizziness, tingling, tremors, sensory change, speech change,  focal weakness, seizures, loss of consciousness, weakness and headaches.  Endo/Heme/Allergies: Negative.  Negative for environmental allergies and polydipsia. Does not bruise/bleed easily.  Psychiatric/Behavioral:  Negative for depression, memory loss, substance abuse and suicidal ideas. The patient is not nervous/anxious and does not have insomnia.       Objective:     There were no vitals taken for this visit. BP Readings from Last 3 Encounters:  07/11/22 (!) 165/110  05/06/22 (!) 151/67  04/16/22 (!) 162/77   Wt Readings from Last 3 Encounters:  07/11/22 220 lb (99.8 kg)  05/06/22 224 lb (101.6 kg)  04/05/22 225 lb 3.2 oz (102.2 kg)      Physical Exam Vitals reviewed.  Constitutional:      Appearance: Normal appearance. She is well-developed. She is obese. She is not diaphoretic.  HENT:     Head: Normocephalic and atraumatic.     Right Ear: Tympanic membrane normal.     Left Ear: Tympanic membrane normal.     Nose: Nose normal. No nasal deformity, septal deviation, mucosal edema or rhinorrhea.     Right Sinus: No maxillary sinus tenderness or frontal sinus tenderness.     Left Sinus: No maxillary sinus tenderness or frontal sinus tenderness.     Mouth/Throat:     Pharynx: No oropharyngeal exudate.     Comments: Refused oral physical exam Eyes:     General: No scleral icterus.    Conjunctiva/sclera: Conjunctivae normal.     Pupils: Pupils are equal, round, and reactive to light.  Neck:     Thyroid: No thyromegaly.     Vascular: No carotid bruit or JVD.     Trachea: Trachea normal. No tracheal tenderness or tracheal deviation.  Cardiovascular:     Rate and Rhythm: Normal rate and regular rhythm.     Chest Wall:  PMI is not displaced.     Pulses: Normal pulses. No decreased pulses.     Heart sounds: Normal heart sounds, S1 normal and S2 normal. Heart sounds not distant. No murmur heard.    No systolic murmur is present.     No diastolic murmur is present.     No friction rub. No gallop. No S3 or S4 sounds.  Pulmonary:     Effort: Pulmonary effort is normal. No tachypnea, accessory muscle usage or respiratory distress.     Breath sounds: Normal breath sounds. No stridor. No decreased breath sounds, wheezing, rhonchi or rales.  Chest:     Chest wall: No tenderness.  Abdominal:     General: Abdomen is flat. Bowel sounds are normal. There is no distension.     Palpations: Abdomen is soft. Abdomen is not rigid.     Tenderness: There is no abdominal tenderness. There is no guarding or rebound.  Musculoskeletal:        General: Normal range of motion.     Cervical back: Normal range of motion and neck supple. No edema, erythema or rigidity. No muscular tenderness. Normal range of motion.  Lymphadenopathy:     Head:     Right side of head: No submental or submandibular adenopathy.     Left side of head: No submental or submandibular adenopathy.     Cervical: No cervical adenopathy.  Skin:    General: Skin is warm and dry.     Coloration: Skin is not pale.     Findings: No rash.     Nails: There is no clubbing.  Neurological:     General: No focal  deficit present.     Mental Status: She is alert and oriented to person, place, and time.     Sensory: No sensory deficit.     Motor: No pronator drift.  Psychiatric:        Mood and Affect: Mood normal.        Speech: Speech normal.        Behavior: Behavior normal.      No results found for any visits on 08/15/22.    Last CBC Lab Results  Component Value Date   WBC 7.1 05/06/2022   HGB 11.8 (L) 05/06/2022   HCT 36.1 05/06/2022   MCV 88.7 05/06/2022   MCH 29.0 05/06/2022   RDW 12.4 05/06/2022   PLT 309 [id number]   Last metabolic  panel Lab Results  Component Value Date   GLUCOSE 129 (H) 07/11/2022   NA 137 07/11/2022   K 3.7 07/11/2022   CL 98 07/11/2022   CO2 23 07/11/2022   BUN 9 07/11/2022   CREATININE 0.70 07/11/2022   EGFR 107 07/11/2022   CALCIUM 9.4 07/11/2022   PROT 7.9 04/12/2021   ALBUMIN 4.0 04/12/2021   LABGLOB 3.9 04/12/2021   AGRATIO 1.0 (L) 04/12/2021   BILITOT 0.2 04/12/2021   ALKPHOS 91 04/12/2021   AST 15 04/12/2021   ALT 8 04/12/2021   ANIONGAP 10 05/06/2022   Last lipids Lab Results  Component Value Date   CHOL 174 07/11/2022   HDL 53 07/11/2022   LDLCALC 109 (H) 07/11/2022   TRIG 60 07/11/2022   CHOLHDL 3.3 07/11/2022   Last hemoglobin A1c Lab Results  Component Value Date   HGBA1C 7.1 (A) 07/11/2022   Last thyroid functions Lab Results  Component Value Date   TSH 1.550 03/16/2022   T4TOTAL 8.7 03/16/2022   Last vitamin D No results found for: "25OHVITD2", "25OHVITD3", "VD25OH" Last vitamin B12 and Folate No results found for: "VITAMINB12", "FOLATE"  The ASCVD Risk score (Arnett DK, et al., 2019) failed to calculate for the following reasons:   The patient has a prior MI or stroke diagnosis    Assessment & Plan:   Problem List Items Addressed This Visit   None No follow-ups on file.   35 minutes spent extra time needed educating patient around lifestyle medicine approach Asencion Noble, MD

## 2022-08-15 ENCOUNTER — Encounter: Payer: Self-pay | Admitting: Critical Care Medicine

## 2022-08-15 ENCOUNTER — Ambulatory Visit: Payer: BC Managed Care – PPO | Attending: Critical Care Medicine | Admitting: Critical Care Medicine

## 2022-08-15 VITALS — BP 128/76 | HR 76 | Temp 97.7°F | Ht 62.0 in | Wt 220.0 lb

## 2022-08-15 DIAGNOSIS — K029 Dental caries, unspecified: Secondary | ICD-10-CM

## 2022-08-15 DIAGNOSIS — E119 Type 2 diabetes mellitus without complications: Secondary | ICD-10-CM | POA: Diagnosis not present

## 2022-08-15 DIAGNOSIS — I10 Essential (primary) hypertension: Secondary | ICD-10-CM

## 2022-08-15 DIAGNOSIS — E1169 Type 2 diabetes mellitus with other specified complication: Secondary | ICD-10-CM

## 2022-08-15 DIAGNOSIS — E785 Hyperlipidemia, unspecified: Secondary | ICD-10-CM | POA: Diagnosis not present

## 2022-08-15 DIAGNOSIS — Z6841 Body Mass Index (BMI) 40.0 and over, adult: Secondary | ICD-10-CM | POA: Diagnosis not present

## 2022-08-15 NOTE — Assessment & Plan Note (Signed)
A1c at goal continue with Jardiance daily

## 2022-08-15 NOTE — Patient Instructions (Signed)
No change in medications Keep up good work  Return 4 months

## 2022-08-15 NOTE — Assessment & Plan Note (Signed)
Continue statins LDL at goal

## 2022-08-15 NOTE — Assessment & Plan Note (Signed)
Has follow-up dental appointment

## 2022-08-15 NOTE — Assessment & Plan Note (Signed)

## 2022-08-15 NOTE — Assessment & Plan Note (Signed)
Blood pressure at goal continue carvedilol 25 mg twice daily and valsartan HCT 320/25 and spironolactone 25 daily

## 2022-09-17 ENCOUNTER — Other Ambulatory Visit: Payer: Self-pay

## 2022-09-25 ENCOUNTER — Institutional Professional Consult (permissible substitution) (HOSPITAL_BASED_OUTPATIENT_CLINIC_OR_DEPARTMENT_OTHER): Payer: 59 | Admitting: Cardiovascular Disease

## 2022-09-25 NOTE — Progress Notes (Deleted)
Advanced Hypertension Clinic Initial Assessment:    Date:  09/25/2022   ID:  Amy [name], DOB 01-20-1976, MRN [id number]  PCP:  Storm Frisk, MD  Cardiologist:  Chrystie Nose, MD  Nephrologist:  Referring MD: Storm Frisk, MD   CC: Hypertension  History of Present Illness:    Amy [name] is a 47 y.o. female with a hx of CAD, hypertension, hyperlipidemia, diabetes, and obesity here to establish care in the Advanced Hypertension Clinic.   Amy [name] has a history of NSTEMI 10/2019.  She underwent left heart cath and was found to have 50% stenosis of ramus intermedius.  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  There was concern for vasospastic angina.  She was started on a statin.  This was subsequently held due to right wrist and arm pain.  However she was subsequently found to have an orthopedic issue and her statin was resumed.  She is struggled with blood pressure control.  When she last saw Dr. Rennis Golden on 03/2022 her blood pressure was 126/72.  At that time she was taking valsartan/HCTZ, spironolactone, and carvedilol.  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   Coronary artery disease    Diabetes mellitus without complication (HCC)    Excessive or frequent menstruation 09/10/2012   Hyperlipidemia    Hypertension    NSTEMI (non-ST elevated myocardial infarction) (HCC) 10/30/2019   PONV (postoperative nausea and vomiting)    with ablation only    Past Surgical History:  Procedure Laterality Date   CHOLECYSTECTOMY  2006   CORONARY ANGIOPLASTY     DILITATION & CURRETTAGE/HYSTROSCOPY WITH HYDROTHERMAL ABLATION N/A 07/10/2013   Procedure: DILATATION & CURETTAGE/HYSTEROSCOPY WITH HYDROTHERMAL ABLATION;  Surgeon: Brock Bad, MD;  Location: [facility];  Service: Gynecology;  Laterality: N/A;   LEFT HEART CATH AND CORONARY  ANGIOGRAPHY N/A 11/02/2019   Procedure: LEFT HEART CATH AND CORONARY ANGIOGRAPHY;  Surgeon: Tonny Bollman, MD;  Location: [facility];  Service: Cardiovascular;  Laterality: N/A;   TUBAL LIGATION  1997   WISDOM TOOTH EXTRACTION  1998    Current Medications: No outpatient medications have been marked as taking for the 09/25/22 encounter (Appointment) with Chilton Si, MD.     Allergies:   Amlodipine, Hydrocodone-acetaminophen, Losartan potassium-hctz, Tetanus antitoxin, and Tetanus toxoids   Social History   Socioeconomic History   Marital status: Significant Other    Spouse name: Stage manager   Number of children: 2   Years of education: Not on file   Highest education level: Some college, no degree  Occupational History   Occupation: Engineer, materials     Comment: [employer]  Tobacco Use   Smoking status: Never   Smokeless tobacco: Never  Vaping Use   Vaping Use: Never used  Substance and Sexual Activity   Alcohol use: No   Drug use: No   Sexual activity: Yes    Partners: Male    Birth control/protection: Surgical  Other Topics Concern   Not on file  Social History Narrative   Lives locally.  Does not routinely exercise.  In school @ [employer] for nsg.         Lives in a one story home    Right Handed    Social Determinants of Health   Financial Resource Strain: Not on file  Food Insecurity: Not on file  Transportation Needs: No Transportation Needs (08/07/2018)   PRAPARE - Administrator, Civil Service (Medical): No    Lack of Transportation (Non-Medical): No  Physical Activity: Not on file  Stress: Not on file  Social Connections: Not on file     Family History: The patient's ***family history includes Breast cancer in her mother; COPD in an other family member; Cancer in an other family member; Diabetes in an other family member; Heart failure in her maternal aunt; Hypertension in her father, mother, and another family  member; Leukemia in her paternal uncle. There is no history of Colon cancer, Colon polyps, Esophageal cancer, Rectal cancer, or Stomach cancer.  ROS:   Please see the history of present illness.    *** All other systems reviewed and are negative.  EKGs/Labs/Other Studies Reviewed:    EKG:  EKG is *** ordered today.  The ekg ordered today demonstrates ***  Recent Labs: 03/16/2022: TSH 1.550 05/06/2022: Hemoglobin 11.8; Platelets 309 07/11/2022: BUN 9; Creatinine, Ser 0.70; Potassium 3.7; Sodium 137   Recent Lipid Panel    Component Value Date/Time   CHOL 174 07/11/2022 0931   TRIG 60 07/11/2022 0931   HDL 53 07/11/2022 0931   CHOLHDL 3.3 07/11/2022 0931   CHOLHDL 4.1 11/01/2019 0726   VLDL 23 11/01/2019 0726   LDLCALC 109 (H) 07/11/2022 0931    Physical Exam:   VS:  There were no vitals taken for this visit. , BMI There is no height or weight on file to calculate BMI. GENERAL:  Well appearing HEENT: Pupils equal round and reactive, fundi not visualized, oral mucosa unremarkable NECK:  No jugular venous distention, waveform within normal limits, carotid upstroke brisk and symmetric, no bruits, no thyromegaly LYMPHATICS:  No cervical adenopathy LUNGS:  Clear to auscultation bilaterally HEART:  RRR.  PMI not displaced or sustained,S1 and S2 within normal limits, no S3, no S4, no clicks, no rubs, *** murmurs ABD:  Flat, positive bowel sounds normal in frequency in pitch, no bruits, no rebound, no guarding, no midline pulsatile mass, no hepatomegaly, no splenomegaly EXT:  2 plus pulses throughout, no edema, no cyanosis no clubbing SKIN:  No rashes no nodules NEURO:  Cranial nerves II through XII grossly intact, motor grossly intact throughout PSYCH:  Cognitively intact, oriented to person place and time   ASSESSMENT/PLAN:    No problem-specific Assessment & Plan notes found for this encounter.   Screening for Secondary Hypertension: { Click here to document screening for  secondary causes of HTN  :[id number]}    Relevant Labs/Studies:    Latest Ref Rng & Units 07/11/2022    9:31 AM 05/06/2022    8:51 AM 01/30/2022   10:37 AM  Basic Labs  Sodium 134 - 144 mmol/L 137  135  134   Potassium 3.5 - 5.2 mmol/L 3.7  3.6  4.5   Creatinine 0.57 - 1.00 mg/dL 1.47  8.29  5.62        Latest Ref Rng & Units 03/16/2022    9:33 AM  Thyroid   TSH 0.450 - 4.500 uIU/mL 1.550       Disposition:    FU with MD/PharmD in {gen number [id number]:[id number]} {Days to years:10300}    Medication Adjustments/Labs and Tests Ordered: Current medicines are reviewed at length with the patient today.  Concerns regarding medicines are outlined above.  No orders of the defined types were placed in this encounter.  No orders of the defined types were placed in this encounter.  Signed, Chilton Si, MD  09/25/2022 4:00 PM    [facility]

## 2022-10-16 ENCOUNTER — Ambulatory Visit: Payer: BC Managed Care – PPO | Admitting: Internal Medicine

## 2022-12-03 ENCOUNTER — Other Ambulatory Visit: Payer: Self-pay

## 2022-12-05 ENCOUNTER — Ambulatory Visit (INDEPENDENT_AMBULATORY_CARE_PROVIDER_SITE_OTHER): Payer: 59 | Admitting: Cardiovascular Disease

## 2022-12-05 ENCOUNTER — Encounter (HOSPITAL_BASED_OUTPATIENT_CLINIC_OR_DEPARTMENT_OTHER): Payer: Self-pay | Admitting: Cardiovascular Disease

## 2022-12-05 VITALS — BP 208/94 | HR 87 | Ht 62.0 in | Wt 217.4 lb

## 2022-12-05 DIAGNOSIS — Z6841 Body Mass Index (BMI) 40.0 and over, adult: Secondary | ICD-10-CM

## 2022-12-05 DIAGNOSIS — Z7984 Long term (current) use of oral hypoglycemic drugs: Secondary | ICD-10-CM

## 2022-12-05 DIAGNOSIS — N920 Excessive and frequent menstruation with regular cycle: Secondary | ICD-10-CM

## 2022-12-05 DIAGNOSIS — I2510 Atherosclerotic heart disease of native coronary artery without angina pectoris: Secondary | ICD-10-CM

## 2022-12-05 DIAGNOSIS — E785 Hyperlipidemia, unspecified: Secondary | ICD-10-CM

## 2022-12-05 DIAGNOSIS — I10 Essential (primary) hypertension: Secondary | ICD-10-CM

## 2022-12-05 DIAGNOSIS — E1159 Type 2 diabetes mellitus with other circulatory complications: Secondary | ICD-10-CM

## 2022-12-05 DIAGNOSIS — E1169 Type 2 diabetes mellitus with other specified complication: Secondary | ICD-10-CM | POA: Diagnosis not present

## 2022-12-05 NOTE — Progress Notes (Deleted)
Advanced Hypertension Clinic Initial Assessment:    Date:  12/05/2022   ID:  Amy [name], DOB 1976-02-23, MRN [id number]  PCP:  Storm Frisk, MD  Cardiologist:  Chrystie Nose, MD  Nephrologist:  Referring MD: Storm Frisk, MD   CC: Hypertension  History of Present Illness:    Amy [name] is a 47 y.o. female with a hx of CAD, diabetes, hypertension, hyperlipidemia here to establish care in the Advanced Hypertension Clinic.    Previous antihypertensives:  Secondary Causes of Hypertension  Medications/Herbal: OCP, steroids, stimulants, antidepressants, weight loss medication, immune suppressants, NSAIDs, sympathomimetics, alcohol, caffeine, licorice, ginseng, St. John's wort, chemo  Sleep Apnea Renal artery stenosis Hyperaldosteronism Hyper/hypothyroidism Pheochromocytoma: palpitations, tachycardia, headache, diaphoresis (plasma metanephrines) Cushing's syndrome: Cushingoid facies, central obesity, proximal muscle weakness, and ecchymoses, adrenal incidentaloma (cortisol) Coarctation of the aorta  Past Medical History:  Diagnosis Date   Anemia    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   Coronary artery disease    Diabetes mellitus without complication (HCC)    Excessive or frequent menstruation 09/10/2012   Hyperlipidemia    Hypertension    NSTEMI (non-ST elevated myocardial infarction) (HCC) 10/30/2019   PONV (postoperative nausea and vomiting)    with ablation only    Past Surgical History:  Procedure Laterality Date   CHOLECYSTECTOMY  2006   CORONARY ANGIOPLASTY     DILITATION & CURRETTAGE/HYSTROSCOPY WITH HYDROTHERMAL ABLATION N/A 07/10/2013   Procedure: DILATATION & CURETTAGE/HYSTEROSCOPY WITH HYDROTHERMAL ABLATION;  Surgeon: Brock Bad, MD;  Location: [facility];  Service: Gynecology;  Laterality: N/A;   LEFT HEART CATH AND CORONARY ANGIOGRAPHY N/A 11/02/2019   Procedure: LEFT HEART CATH AND CORONARY ANGIOGRAPHY;   Surgeon: Tonny Bollman, MD;  Location: [facility] INVASIVE CV LAB;  Service: Cardiovascular;  Laterality: N/A;   TUBAL LIGATION  1997   WISDOM TOOTH EXTRACTION  1998    Current Medications: No outpatient medications have been marked as taking for the 12/05/22 encounter (Appointment) with Chilton Si, MD.     Allergies:   Amlodipine, Hydrocodone-acetaminophen, Losartan potassium-hctz, Tetanus antitoxin, and Tetanus toxoids   Social History   Socioeconomic History   Marital status: Significant Other    Spouse name: Stage manager   Number of children: 2   Years of education: Not on file   Highest education level: Some college, no degree  Occupational History   Occupation: Engineer, materials     Comment: [employer]  Tobacco Use   Smoking status: Never   Smokeless tobacco: Never  Vaping Use   Vaping Use: Never used  Substance and Sexual Activity   Alcohol use: No   Drug use: No   Sexual activity: Yes    Partners: Male    Birth control/protection: Surgical  Other Topics Concern   Not on file  Social History Narrative   Lives locally.  Does not routinely exercise.  In school @ [employer] for nsg.         Lives in a one story home    Right Handed    Social Determinants of Health   Financial Resource Strain: Not on file  Food Insecurity: Not on file  Transportation Needs: No Transportation Needs (08/07/2018)   PRAPARE - Administrator, Civil Service (Medical): No    Lack of Transportation (Non-Medical): No  Physical Activity: Not on file  Stress: Not on file  Social Connections: Not on file     Family History: The patient's ***family history includes Breast cancer  in her mother; COPD in an other family member; Cancer in an other family member; Diabetes in an other family member; Heart failure in her maternal aunt; Hypertension in her father, mother, and another family member; Leukemia in her paternal uncle. There is no history of Colon cancer, Colon  polyps, Esophageal cancer, Rectal cancer, or Stomach cancer.  ROS:   Please see the history of present illness.    *** All other systems reviewed and are negative.  EKGs/Labs/Other Studies Reviewed:    EKG:  EKG is *** ordered today.  The ekg ordered today demonstrates ***  Recent Labs: 03/16/2022: TSH 1.550 05/06/2022: Hemoglobin 11.8; Platelets 309 07/11/2022: BUN 9; Creatinine, Ser 0.70; Potassium 3.7; Sodium 137   Recent Lipid Panel    Component Value Date/Time   CHOL 174 07/11/2022 0931   TRIG 60 07/11/2022 0931   HDL 53 07/11/2022 0931   CHOLHDL 3.3 07/11/2022 0931   CHOLHDL 4.1 11/01/2019 0726   VLDL 23 11/01/2019 0726   LDLCALC 109 (H) 07/11/2022 0931    Physical Exam:   VS:  There were no vitals taken for this visit. , BMI There is no height or weight on file to calculate BMI. GENERAL:  Well appearing HEENT: Pupils equal round and reactive, fundi not visualized, oral mucosa unremarkable NECK:  No jugular venous distention, waveform within normal limits, carotid upstroke brisk and symmetric, no bruits, no thyromegaly LUNGS:  Clear to auscultation bilaterally HEART:  RRR.  PMI not displaced or sustained,S1 and S2 within normal limits, no S3, no S4, no clicks, no rubs, *** murmurs ABD:  Flat, positive bowel sounds normal in frequency in pitch, no bruits, no rebound, no guarding, no midline pulsatile mass, no hepatomegaly, no splenomegaly EXT:  2 plus pulses throughout, no edema, no cyanosis no clubbing SKIN:  No rashes no nodules NEURO:  Cranial nerves II through XII grossly intact, motor grossly intact throughout PSYCH:  Cognitively intact, oriented to person place and time   ASSESSMENT/PLAN:    No problem-specific Assessment & Plan notes found for this encounter.   Screening for Secondary Hypertension: { Click here to document screening for secondary causes of HTN  :[id number]}    Relevant Labs/Studies:    Latest Ref Rng & Units 07/11/2022    9:31 AM  05/06/2022    8:51 AM 01/30/2022   10:37 AM  Basic Labs  Sodium 134 - 144 mmol/L 137  135  134   Potassium 3.5 - 5.2 mmol/L 3.7  3.6  4.5   Creatinine 0.57 - 1.00 mg/dL 4.09  8.11  9.14        Latest Ref Rng & Units 03/16/2022    9:33 AM  Thyroid   TSH 0.450 - 4.500 uIU/mL 1.550       Disposition:    FU with MD/PharmD in {gen number [id number]} {Days to years:10300}    Medication Adjustments/Labs and Tests Ordered: Current medicines are reviewed at length with the patient today.  Concerns regarding medicines are outlined above.  No orders of the defined types were placed in this encounter.  No orders of the defined types were placed in this encounter.    Signed, Chilton Si, MD  12/05/2022 9:16 AM    [facility]

## 2022-12-05 NOTE — Progress Notes (Signed)
Advanced Hypertension Clinic Initial Assessment:    Date:  12/05/2022   ID:  Amy [name], DOB [birth year], MRN [id number]  PCP:  Storm Frisk, MD  Cardiologist:  Chrystie Nose, MD  Nephrologist:  Referring MD: Storm Frisk, MD   CC: Hypertension  History of Present Illness:    Amy [name] is a 47 y.o. female with a hx of CAD, diabetes, hypertension, hyperlipidemia, here to establish care in the Advanced Hypertension Clinic. On 07/11/2022 she saw Dr. Delford Field and her blood pressure was 165/110 on repeat (198/120 on arrival). Carvedilol was increased to 25 mg BID, and she was started on spironolactone 25 mg daily. Continued valsartan-HCTZ. She followed up with her PCP 07/2022 and her blood pressure was initially elevated but settled to 128/76. She has also followed with Dr. Rennis Golden, last seen by him 04/05/2022 where her blood pressure was more controlled.  Today, she reports that she hasn't had hypertension for too long. In the office today her blood pressure is elevated to 223/112, and remains elevated at 208/94 on manual recheck. Normally she takes her medications by 6 AM; she did not yet take them today. Her regimen includes carvedilol, spironolactone, and valsartan-HCTZ. She states that she is feeling very stressed, especially at work. Had been forced to cut back on working long hours. At home, she recently measured her blood pressure at 135/80. If she is relaxed and sticking to a good diet, she has seen readings as low as 126/78. She wants to keep working on weight loss and dietary changes. Generally she feels well when walking. She rarely drinks caffeine or soda, and doesn't drink alcohol. She has been eating more fruit but has difficulty with finding other nutritional snacks. Usually she does monitor her sodium intake; she doesn't add extra salt to her meals. She denies snoring. Often she has less than 8 hours of sleep and feels okay. She notes she would feel groggy and  less rested if she sleeps for 8 hours. She denies any palpitations, chest pain, shortness of breath, peripheral edema, lightheadedness, headaches, syncope, orthopnea, or PND.  Amy [name] has a history of NSTEMI 10/2019 that was due to vasospasm.    Previous antihypertensives: Amlodipine - hives  Past Medical History:  Diagnosis Date   Anemia    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   Coronary artery disease    Diabetes mellitus without complication (HCC)    Excessive or frequent menstruation 09/10/2012   Hyperlipidemia    Hypertension    NSTEMI (non-ST elevated myocardial infarction) (HCC) 10/30/2019   PONV (postoperative nausea and vomiting)    with ablation only    Past Surgical History:  Procedure Laterality Date   CHOLECYSTECTOMY  2006   CORONARY ANGIOPLASTY     DILITATION & CURRETTAGE/HYSTROSCOPY WITH HYDROTHERMAL ABLATION N/A 07/10/2013   Procedure: DILATATION & CURETTAGE/HYSTEROSCOPY WITH HYDROTHERMAL ABLATION;  Surgeon: Brock Bad, MD;  Location: [facility];  Service: Gynecology;  Laterality: N/A;   LEFT HEART CATH AND CORONARY ANGIOGRAPHY N/A 11/02/2019   Procedure: LEFT HEART CATH AND CORONARY ANGIOGRAPHY;  Surgeon: Tonny Bollman, MD;  Location: [facility] INVASIVE CV LAB;  Service: Cardiovascular;  Laterality: N/A;   TUBAL LIGATION  1997   WISDOM TOOTH EXTRACTION  1998    Current Medications: Current Meds  Medication Sig   aspirin 81 MG EC tablet Take 1 tablet (81 mg total) by mouth daily.   atorvastatin (LIPITOR) 40 MG tablet Take 1 tablet (40 mg total) by mouth daily.  carvedilol (COREG) 25 MG tablet Take 1 tablet (25 mg total) by mouth 2 (two) times daily with a meal.   empagliflozin (JARDIANCE) 10 MG TABS tablet Take 1 tablet (10 mg total) by mouth daily.   ezetimibe (ZETIA) 10 MG tablet Take 1 tablet (10 mg total) by mouth daily.   fexofenadine (ALLEGRA) 180 MG tablet Take 180 mg by mouth daily.   fluticasone (FLONASE) 50 MCG/ACT nasal spray Place 2  sprays into both nostrils daily.   Multiple Vitamin (MULTIVITAMIN WITH MINERALS) TABS tablet Take 1 tablet by mouth daily.   nitroGLYCERIN (NITROSTAT) 0.4 MG SL tablet Place 1 tablet (0.4 mg total) under the tongue every 5 (five) minutes x 3 doses as needed for chest pain.   nortriptyline (PAMELOR) 25 MG capsule Take 1 capsule (25 mg total) by mouth at bedtime to prevent pain the next day takes as needed.   spironolactone (ALDACTONE) 25 MG tablet Take 1 tablet (25 mg total) by mouth daily.   valsartan-hydrochlorothiazide (DIOVAN-HCT) 320-25 MG tablet Take 1 tablet by mouth daily.     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  Does not routinely exercise.  In school @ [employer] for nsg.         Lives in a one story home    Right Handed    Social Determinants of Health   Financial Resource Strain: Not on file  Food Insecurity: No Food Insecurity (12/05/2022)   Hunger Vital Sign    Worried About Running Out of Food in the Last Year: Never true    Ran Out of Food in the Last Year: Never true  Transportation Needs: No Transportation Needs (12/05/2022)   PRAPARE - Administrator, Civil Service (Medical): No    Lack of Transportation (Non-Medical): No  Physical Activity:  Inactive (12/05/2022)   Exercise Vital Sign    Days of Exercise per Week: 0 days    Minutes of Exercise per Session: 0 min  Stress: Not on file  Social Connections: Not on file     Family History: The patient's family history includes Breast cancer in her mother; COPD in an other family member; Cancer in an other family member; Diabetes in an other family member; Heart failure in her maternal aunt; Hypertension in her father, mother, and another family member; Leukemia in her paternal uncle. There is no history of Colon cancer, Colon polyps, Esophageal cancer, Rectal cancer, or Stomach cancer.  ROS:   Please see the history of present illness.    (+) Stress All other systems reviewed and are negative.  EKGs/Labs/Other Studies Reviewed:    Left Heart Cath  11/02/2019: Ramus lesion is 50% stenosed.   Patent coronary arteries with no  evidence of obstructive CAD, with the exception of catheter-induced vasospasm in a small intermediate branch   Recommend: medical therapy  EKG:  EKG is personally reviewed. 12/05/2022: Not ordered.  Recent Labs: 03/16/2022: TSH 1.550 05/06/2022: Hemoglobin 11.8; Platelets 309 07/11/2022: BUN 9; Creatinine, Ser 0.70; Potassium 3.7; Sodium 137   Recent Lipid Panel    Component Value Date/Time   CHOL 174 07/11/2022 0931   TRIG 60 07/11/2022 0931   HDL 53 07/11/2022 0931   CHOLHDL 3.3 07/11/2022 0931   CHOLHDL 4.1 11/01/2019 0726   VLDL 23 11/01/2019 0726   LDLCALC 109 (H) 07/11/2022 0931    Physical Exam:    VS:  BP (!) 208/94 (BP Location: Right Arm, Patient Position: Sitting, Cuff Size: Large)   Pulse 87   Ht 5\' 2"  (1.575 m)   Wt 217 lb 6.4 oz (98.6 kg)   SpO2 99%   BMI 39.76 kg/m  , BMI Body mass index is 39.76 kg/m. GENERAL:  Well appearing HEENT: Pupils equal round and reactive, fundi not visualized, oral mucosa unremarkable NECK:  No jugular venous distention, waveform within normal limits, carotid upstroke brisk and symmetric, no  bruits, no thyromegaly LUNGS:  Clear to auscultation bilaterally HEART:  RRR.  PMI not displaced or sustained, S1 and S2 within normal limits, no S3, no S4, no clicks, no rubs, no murmurs ABD:  Flat, positive bowel sounds normal in frequency in pitch, no bruits, no rebound, no guarding, no midline pulsatile mass, no hepatomegaly, no splenomegaly EXT:  2 plus pulses throughout, no edema, no cyanosis, no clubbing SKIN:  No rashes, no nodules NEURO:  Cranial nerves II through XII grossly intact, motor grossly intact throughout PSYCH:  Cognitively intact, oriented to person place and time   ASSESSMENT/PLAN:    Assessment and Plan    # Hypertensive urgency: Elevated blood pressure in the office, but patient reports lower readings at home. Patient has been under significant stress and missed her morning dose of antihypertensive medications. No symptoms of end-organ damage.  Per her listed pharmacy, medications have never been filled.  However, she reports getting [facility] and taking as prescribed.  She hasn't yet taken them today. -Continue current antihypertensive regimen (Carvedilol, Spironolactone, Valsartan/Hydrochlorothiazide). She will go home and take them immediately. -Check blood pressure at home and record readings twice daily for the next two weeks. -Return in two weeks with home blood pressure readings and blood pressure machine for reassessment. -Call or go to the ED if BP does not normalize after taking her prescribed antihypertensives -Order Renal Doppler Ultrasound to rule out renal artery stenosis. -Order labs including Renin, Aldosterone, and Cortisol to rule out hormonal causes of hypertension.  # Stress and Lifestyle: Patient reports significant stress related to work and family responsibilities. Expressed interest in improving diet and losing weight. -Refer to a nutritionist for dietary counseling. -Refer to Renaee Munda, care guide, for stress management and health  coaching. -Refer to the [facility] program for exercise and diet support.  # Diabetes: Patient has a known history of diabetes. -Continue current management. -Consider GLP1  Follow-up in two weeks to reassess blood pressure control and discuss results of workup for secondary hypertension.     # Hyperlipidemia: Continue atorvastatin and Zetia  # Coronary vasospasm:  Stable.  No recent symptoms.  Screening for Secondary Hypertension:     12/05/2022   11:59 AM  Causes  Drugs/Herbals Screened     - Comments rare caffeine.  No EtOH.  limits salt.  Renovascular HTN Screened     - Comments Check renal artery Dopplers  Sleep Apnea Screened     - Comments no symptoms  Thyroid Disease Screened  Hyperaldosteronism Screened     - Comments check renin and aldosterone  Pheochromocytoma N/A  Cushing's Syndrome Screened     - Comments check cortisol  Hyperparathyroidism N/A  Coarctation of the Aorta Screened     - Comments BP symmetric  Compliance Screened     - Comments meds not filled    Relevant Labs/Studies:    Latest Ref Rng & Units 07/11/2022    9:31 AM 05/06/2022    8:51 AM 01/30/2022   10:37 AM  Basic Labs  Sodium 134 - 144 mmol/L 137  135  134   Potassium 3.5 - 5.2 mmol/L 3.7  3.6  4.5   Creatinine 0.57 - 1.00 mg/dL 0.98  1.19  1.47        Latest Ref Rng & Units 03/16/2022    9:33 AM  Thyroid   TSH 0.450 - 4.500 uIU/mL 1.550                 12/05/2022   12:26 PM  Renovascular   Renal Artery [place] Completed Yes     Disposition:  FU with APP in 2 weeks.  Medication Adjustments/Labs and Tests Ordered: Current medicines are reviewed at length with the patient today.  Concerns regarding medicines are outlined above.   Orders Placed This Encounter  Procedures   Aldosterone + renin activity w/ ratio   Cortisol   Amb Referral To Provider Referral Exercise Program (P.R.E.P)   VAS US RENAL ARTERY DUPLEX   No orders of the defined types were placed in this  encounter.  I,Mathew Stumpf,acting as a Neurosurgeon for Chilton Si, MD.,have documented all relevant documentation on the behalf of Chilton Si, MD,as directed by  Chilton Si, MD while in the presence of Chilton Si, MD.  I, Merline Perkin C. Duke Salvia, MD have reviewed all documentation for this visit.  The documentation of the exam, diagnosis, procedures, and orders on 12/05/2022 are all accurate and complete.   Signed, Chilton Si, MD  12/05/2022 12:42 PM    [facility]

## 2022-12-05 NOTE — Patient Instructions (Signed)
Medication Instructions:  GO STRAIGHT HOME AND TAKE YOUR MEDICATIONS AS SOON AS YOU GET THERE    Labwork: RENIN/ALDOSTERONE/CORTISOL LEVELS SOON. NEEDS TO BE DONE FIRST THING IN THE MORNING    Testing/Procedures: Your physician has requested that you have a renal artery duplex. During this test, an ultrasound is used to evaluate blood flow to the kidneys. Allow one hour for this exam. Do not eat after midnight the day before and avoid carbonated beverages. Take your medications as you usually do. THE OFFICE WILL CALL YOU TO SCHEDULE    Follow-Up: 12/19/2022 AT 3:00 PM    You will receive a phone call from the PREP exercise and nutrition program to schedule an initial assessment.   Special Instructions:  MONITOR YOUR BLOOD PRESSURE TWICE A DAY, LOG IN THE BOOK PROVIDED. BRING THE BOOK AND YOUR BLOOD PRESSURE MACHINE TO YOUR FOLLOW UP     DASH Eating Plan DASH stands for "Dietary Approaches to Stop Hypertension." The DASH eating plan is a healthy eating plan that has been shown to reduce high blood pressure (hypertension). It may also reduce your risk for type 2 diabetes, heart disease, and stroke. The DASH eating plan may also help with weight loss. What are tips for following this plan?  General guidelines Avoid eating more than 2,300 mg (milligrams) of salt (sodium) a day. If you have hypertension, you may need to reduce your sodium intake to 1,500 mg a day. Limit alcohol intake to no more than 1 drink a day for nonpregnant women and 2 drinks a day for men. One drink equals 12 oz of beer, 5 oz of wine, or 1 oz of hard liquor. Work with your health care provider to maintain a healthy body weight or to lose weight. Ask what an ideal weight is for you. Get at least 30 minutes of exercise that causes your heart to beat faster (aerobic exercise) most days of the week. Activities may include walking, swimming, or biking. Work with your health care provider or diet and nutrition specialist  (dietitian) to adjust your eating plan to your individual calorie needs. Reading food labels  Check food labels for the amount of sodium per serving. Choose foods with less than 5 percent of the Daily Value of sodium. Generally, foods with less than 300 mg of sodium per serving fit into this eating plan. To find whole grains, look for the word "whole" as the first word in the ingredient list. Shopping Buy products labeled as "low-sodium" or "no salt added." Buy fresh foods. Avoid canned foods and premade or frozen meals. Cooking Avoid adding salt when cooking. Use salt-free seasonings or herbs instead of table salt or sea salt. Check with your health care provider or pharmacist before using salt substitutes. Do not fry foods. Cook foods using healthy methods such as baking, boiling, grilling, and broiling instead. Cook with heart-healthy oils, such as olive, canola, soybean, or sunflower oil. Meal planning Eat a balanced diet that includes: 5 or more servings of fruits and vegetables each day. At each meal, try to fill half of your plate with fruits and vegetables. Up to 6-8 servings of whole grains each day. Less than 6 oz of lean meat, poultry, or fish each day. A 3-oz serving of meat is about the same size as a deck of cards. One egg equals 1 oz. 2 servings of low-fat dairy each day. A serving of nuts, seeds, or beans 5 times each week. Heart-healthy fats. Healthy fats called Omega-3 fatty acids  are found in foods such as flaxseeds and coldwater fish, like sardines, salmon, and mackerel. Limit how much you eat of the following: Canned or prepackaged foods. Food that is high in trans fat, such as fried foods. Food that is high in saturated fat, such as fatty meat. Sweets, desserts, sugary drinks, and other foods with added sugar. Full-fat dairy products. Do not salt foods before eating. Try to eat at least 2 vegetarian meals each week. Eat more home-cooked food and less restaurant,  buffet, and fast food. When eating at a restaurant, ask that your food be prepared with less salt or no salt, if possible. What foods are recommended? The items listed may not be a complete list. Talk with your dietitian about what dietary choices are best for you. Grains Whole-grain or whole-wheat bread. Whole-grain or whole-wheat pasta. Brown rice. Orpah Cobb. Bulgur. Whole-grain and low-sodium cereals. Pita bread. Low-fat, low-sodium crackers. Whole-wheat flour tortillas. Vegetables Fresh or frozen vegetables (raw, steamed, roasted, or grilled). Low-sodium or reduced-sodium tomato and vegetable juice. Low-sodium or reduced-sodium tomato sauce and tomato paste. Low-sodium or reduced-sodium canned vegetables. Fruits All fresh, dried, or frozen fruit. Canned fruit in natural juice (without added sugar). Meat and other protein foods Skinless chicken or [place]. Ground chicken or [place]. Pork with fat trimmed off. Fish and seafood. Egg whites. Dried beans, peas, or lentils. Unsalted nuts, nut butters, and seeds. Unsalted canned beans. Lean cuts of beef with fat trimmed off. Low-sodium, lean deli meat. Dairy Low-fat (1%) or fat-free (skim) milk. Fat-free, low-fat, or reduced-fat cheeses. Nonfat, low-sodium ricotta or cottage cheese. Low-fat or nonfat yogurt. Low-fat, low-sodium cheese. Fats and oils Soft margarine without trans fats. Vegetable oil. Low-fat, reduced-fat, or light mayonnaise and salad dressings (reduced-sodium). Canola, safflower, olive, soybean, and sunflower oils. Avocado. Seasoning and other foods Herbs. Spices. Seasoning mixes without salt. Unsalted popcorn and pretzels. Fat-free sweets. What foods are not recommended? The items listed may not be a complete list. Talk with your dietitian about what dietary choices are best for you. Grains Baked goods made with fat, such as croissants, muffins, or some breads. Dry pasta or rice meal packs. Vegetables Creamed or fried  vegetables. Vegetables in a cheese sauce. Regular canned vegetables (not low-sodium or reduced-sodium). Regular canned tomato sauce and paste (not low-sodium or reduced-sodium). Regular tomato and vegetable juice (not low-sodium or reduced-sodium). Rosita Fire. Olives. Fruits Canned fruit in a light or heavy syrup. Fried fruit. Fruit in cream or butter sauce. Meat and other protein foods Fatty cuts of meat. Ribs. Fried meat. Tomasa Blase. Sausage. Bologna and other processed lunch meats. Salami. Fatback. Hotdogs. Bratwurst. Salted nuts and seeds. Canned beans with added salt. Canned or smoked fish. Whole eggs or egg yolks. Chicken or [place] with skin. Dairy Whole or 2% milk, cream, and half-and-half. Whole or full-fat cream cheese. Whole-fat or sweetened yogurt. Full-fat cheese. Nondairy creamers. Whipped toppings. Processed cheese and cheese spreads. Fats and oils Butter. Stick margarine. Lard. Shortening. Ghee. Bacon fat. Tropical oils, such as coconut, palm kernel, or palm oil. Seasoning and other foods Salted popcorn and pretzels. Onion salt, garlic salt, seasoned salt, table salt, and sea salt. Worcestershire sauce. Tartar sauce. Barbecue sauce. Teriyaki sauce. Soy sauce, including reduced-sodium. Steak sauce. Canned and packaged gravies. Fish sauce. Oyster sauce. Cocktail sauce. Horseradish that you find on the shelf. Ketchup. Mustard. Meat flavorings and tenderizers. Bouillon cubes. Hot sauce and Tabasco sauce. Premade or packaged marinades. Premade or packaged taco seasonings. Relishes. Regular salad dressings. Where to find more information:  National Heart, Lung, and Blood Institute: PopSteam.is American Heart Association: www.heart.org Summary The DASH eating plan is a healthy eating plan that has been shown to reduce high blood pressure (hypertension). It may also reduce your risk for type 2 diabetes, heart disease, and stroke. With the DASH eating plan, you should limit salt (sodium) intake to  2,300 mg a day. If you have hypertension, you may need to reduce your sodium intake to 1,500 mg a day. When on the DASH eating plan, aim to eat more fresh fruits and vegetables, whole grains, lean proteins, low-fat dairy, and heart-healthy fats. Work with your health care provider or diet and nutrition specialist (dietitian) to adjust your eating plan to your individual calorie needs. This information is not intended to replace advice given to you by your health care provider. Make sure you discuss any questions you have with your health care provider. Document Released: 05/03/2011 Document Revised: 04/26/2017 Document Reviewed: 05/07/2016 Elsevier Patient Education  2020 [employer].

## 2022-12-07 ENCOUNTER — Other Ambulatory Visit (HOSPITAL_BASED_OUTPATIENT_CLINIC_OR_DEPARTMENT_OTHER): Payer: Self-pay | Admitting: Cardiovascular Disease

## 2022-12-07 ENCOUNTER — Telehealth: Payer: Self-pay

## 2022-12-07 NOTE — Telephone Encounter (Signed)
VMT pt requesting call back to discuss referral to PREP. Next class will start on 12/18/22 in the evenings on T/TH 6p-715p.

## 2022-12-11 LAB — ALDOSTERONE + RENIN ACTIVITY W/ RATIO
Aldos/Renin Ratio: 5.2 (ref 0.0–30.0)
Aldosterone: 6.3 ng/dL (ref 0.0–30.0)
Renin Activity, Plasma: 1.223 ng/mL/hr (ref 0.167–5.380)

## 2022-12-12 NOTE — Progress Notes (Signed)
[facility] PREP Evaluation  Patient Details  Name: Amy [name] MRN: [id number] Date of Birth: 19-Sep-1975 Age: 47 y.o. PCP: Storm Frisk, MD  Vitals:   12/11/22 1630  BP: (!) 170/110  Pulse: 81  SpO2: 98%  Weight: 217 lb 3.2 oz (98.5 kg)     [facility] Eval - 12/12/22 1100       [facility] "PREP" Location   [facility] "PREP" Location [facility]      Referral    Referring Provider Duke Salvia    Reason for referral Hypertension;Diabetes;Inactivity    Program Start Date 12/18/22   T/TH 6p-715p x 12 wks     Measurement   Waist Circumference 46 inches    Hip Circumference 44.5 inches    Body fat 45.4 percent      Information for Trainer   Goals Reduce stress, lose weight 40 lbs, improve cholesterol and nutrition    Current Exercise walk at work    Orthopedic Concerns Right knee    Pertinent Medical History HTN, IDDM2,MI    Current Barriers None    Restrictions/Precautions Diabetic snack before exercise    Medications that affect exercise Medication causing dizziness/drowsiness      Timed Up and Go (TUGS)   Timed Up and Go Low risk <9 seconds      Mobility and Daily Activities   I find it easy to walk up or down two or more flights of stairs. 1    I have no trouble taking out the trash. 4    I do housework such as vacuuming and dusting on my own without difficulty. 4    I can easily lift a gallon of milk (8lbs). 4    I can easily walk a mile. 4    I have no trouble reaching into high cupboards or reaching down to pick up something from the floor. 4    I do not have trouble doing out-door work such as Loss adjuster, chartered, raking leaves, or gardening. 4      Mobility and Daily Activities   I feel younger than my age. 1    I feel independent. 4    I feel energetic. 4    I live an active life.  4    I feel strong. 4    I feel healthy. 2    I feel active as other people my age. 4      How fit and strong are you.   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   Coronary artery disease    Diabetes mellitus without complication (HCC)    Excessive or frequent menstruation 09/10/2012   Hyperlipidemia    Hypertension    NSTEMI (non-ST elevated myocardial infarction) (HCC) 10/30/2019   PONV (postoperative nausea and vomiting)    with ablation only   Past Surgical History:  Procedure Laterality Date   CHOLECYSTECTOMY  2006   CORONARY ANGIOPLASTY     DILITATION & CURRETTAGE/HYSTROSCOPY WITH HYDROTHERMAL ABLATION N/A 07/10/2013   Procedure: DILATATION & CURETTAGE/HYSTEROSCOPY WITH HYDROTHERMAL ABLATION;  Surgeon: Brock Bad, MD;  Location: [facility];  Service: Gynecology;  Laterality: N/A;   LEFT HEART CATH AND CORONARY ANGIOGRAPHY N/A 11/02/2019   Procedure: LEFT HEART CATH AND CORONARY ANGIOGRAPHY;  Surgeon: Tonny Bollman, MD;  Location: [facility] INVASIVE CV LAB;  Service: Cardiovascular;  Laterality: N/A;   TUBAL LIGATION  1997   WISDOM TOOTH EXTRACTION  1998   Social History   Tobacco Use  Smoking Status Never  Smokeless Tobacco Never    Bonnye Fava 12/12/2022, 11:05 AM

## 2022-12-17 ENCOUNTER — Encounter: Payer: Self-pay | Admitting: Pharmacist

## 2022-12-17 DIAGNOSIS — Z79899 Other long term (current) drug therapy: Secondary | ICD-10-CM

## 2022-12-17 NOTE — Progress Notes (Signed)
[facility])                                            [facility] Quality Pharmacy Team                                        Statin Quality Measure Assessment    12/17/2022  Amy [name] Oct 06, 1975 [id number]  Per review of chart and payor information, patient has a diagnosis of cardiovascular disease but is not currently filling a statin prescription.  This places patient into the [facility] (Statin Use In Patients with Cardiovascular Disease) measure for CMS.    Atorvastatin is on her medication list but has not been filled this year.  Patient has a follow up appointment on 12/18/22.  If deemed therapeutically appropriate, statin therapy could be addressed during to the upcoming appointment. If Patient has experienced statin intolerance, a statin exclusion code could be associated with her visit (which would remove the patient from the measure).       Component Value Date/Time   CHOL 174 07/11/2022 0931   TRIG 60 07/11/2022 0931   HDL 53 07/11/2022 0931   CHOLHDL 3.3 07/11/2022 0931   CHOLHDL 4.1 11/01/2019 0726   VLDL 23 11/01/2019 0726   LDLCALC 109 (H) 07/11/2022 0931     Please consider ONE of the following recommendations:  Initiate high intensity statin Atorvastatin 40mg  once daily, #90, 3 refills   Rosuvastatin 20mg  once daily, #90, 3 refills    Initiate moderate intensity  statin with reduced frequency if prior  statin intolerance 1x weekly, #13, 3 refills   2x weekly, #26, 3 refills   3x weekly, #39, 3 refills    Code for past statin intolerance  (required annually)   Provider Requirements:  Must asociate code during an office visit or telehealth encounter   Drug Induced Myopathy G72.0   Myalgia M79.1   Myositis, unspecified M60.9   Myopathy, unspecified G72.9   Rhabdomyolysis M62.82     Plan:  Route note to Provider prior to the upcoming appointment.    Beecher Mcardle, PharmD, BCACP [facility] Clinical  Pharmacist [phone]

## 2022-12-17 NOTE — Progress Notes (Deleted)
Established Patient Office Visit  Subjective   Patient ID: Amy [name], female    DOB: 1976/01/07  Age: 47 y.o. MRN: [id number]  Cc primary care follow-up hypertension  11/14/21 Giovanni Bath is a well appearing 47 year old female with a history of coronary artery disease, diabetes, and hypertension presents for follow up. No acute concerns today. Hemoglobin A1C 6.6 (08/2021). She states she checks her sugars twice a day and they usually run between 120-130. Adherent to her medications. Patient states she is doing her best to increase her activity level by brisk walking but struggles to stay disciplined due to the demand of her work. She is being proactive with her diet and is incorporating more vegetables and fresh fruits daily. Denies alcohol and substance abuse.  Blood pressure today 155/92. Last few office visits with other providers have also been elevated. In further questioning her about her diet we discussed salt intake. She admits to not monitoring her intake and often times will use sauces or dressings that she believes maybe increasing her blood pressure.   7/24 Patient is here for short-term follow-up on arrival blood pressure quite elevated 160/100 but she states at home it runs in the 142/95 range.  Blood sugar though is much improved 126 and she has lost some weight.  Patient is on the valsartan HCT and carvedilol.  A1c at the last visit was 6.6 and we continued with Jardiance daily.  Today the A1c is at 7.5  The patient also received a lifestyle medicine handout has been trying to change her diet and eat healthier food choices.   8/15 Patient seen for return visit on arrival blood pressure 161/96.  She states at home she does occasionally get 139/86.  When she was traveling to the [place] her blood pressure was lower.  She is trying to eat healthier.  She is started an exercise program.  She is taking the Aldactone daily.  There is been a slight reduction in  blood pressure but not substantial.  She is not yet at goal.  Patient maintains carvedilol 25 mg twice daily, Aldactone 25 mg daily, valsartan HCT maximum dose daily.  Note patient cannot tolerate amlodipine from hives Blood sugar is good today 139  9/5 The patient is seen in return follow-up on arrival blood pressure is 136/85.  She is seen by clinical pharmacy as documented below last week. We had added hydralazine 3 times daily but she was not taking the medication at that visit.  Clinical pharmacy also found her to be bradycardic and reduced her carvedilol to 12 and half milligram twice daily and did restart spironolactone at a lower dose of 12.5 mg daily.  Patient maintains valsartan HCT 320/25. Patient's been trying to follow a lifestyle medicine approach and is lost some weight and is exercising more.  She is more cautious about the salt that she is adding to her diet.  She has no other complaints.  She does agree to and did receive a flu vaccine this visit.  10/12 Patient seen in return follow-up blood pressure on arrival is elevated still she has been taking her medications but on lots of stress.  Patient blood pressure rechecked was more normal 128/86.  She is taking multiple medications.  She is stressed because her daughter is pregnant with preeclampsia.  She is not exercising like she should.  She is trying to eat healthier   07/11/22 Patient seen in return follow-up for hypertension unfortunately on arrival blood pressure 198/120 repeat  165/110.  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  She has been compliant with her medications.  She has not been following a lifestyle approach with minimal change in diet and no opportunity to exercise other than walking around on her job.  The patient had recently seen cardiology with normal-appearing blood pressure numbers she does not measure her blood  pressure at home as we requested.  Cardiology wanted lipid follow-up she has had elevated lipids with the coronary syndrome in the past.  Patient did see our clinical pharmacist twice since I last seen her last visit being in October at that time even then blood pressure was elevated.  No medication changes were made. Below is a cardiology visit from November  cards OV 03/2022 1. NSTEMI-thought to be related to vasospasm (10/2019) 2. Type 2 diabetes 3. Dyslipidemia 4. Hypertension 5. Morbid obesity   PLAN: 1.   [name] continues to do well with interval resolution of her right arm pain.  She is working on weight loss and dietary changes.  Her cholesterols come down accordingly.  I would recommend staying on her current dose of the atorvastatin.  Would repeat lipids in 3 to 6 months.  Her target LDL is less than 70 and if she is not able to achieve that with the lifestyle modifications, would consider adding ezetimibe to her statin.  Plan follow-up with me in 6 months or sooner as necessary. Patient then went to the emergency room in December for chest pain syndrome coronary disease ruled out likely stress  07/2022 Patient returns in short-term follow-up for blood pressure check initially on arrival blood pressure quite elevated but after the patient settled down blood pressure is 128/76.  At home she is getting anywhere from 119/76 to 135/85 on home monitor.  She is trying to eat more healthy foods and exercising more.  Patient maintains her current blood pressure program without change.  Also blood sugars have been in better control.  There are no other complaints.  12/19/22  Hypertensive urgency: Elevated blood pressure in the office, but patient reports lower readings at home. Patient has been under significant stress and missed her morning dose of antihypertensive medications. No symptoms of end-organ damage.  Per her listed pharmacy, medications have never been filled.  However, she  reports getting them from [facility] and taking as prescribed.  She hasn't yet taken them today. -Continue current antihypertensive regimen (Carvedilol, Spironolactone, Valsartan/Hydrochlorothiazide). She will go home and take them immediately. -Check blood pressure at home and record readings twice daily for the next two weeks. -Return in two weeks with home blood pressure readings and blood pressure machine for reassessment. -Call or go to the ED if BP does not normalize after taking her prescribed antihypertensives -Order Renal Doppler Ultrasound to rule out renal artery stenosis. -Order labs including Renin, Aldosterone, and Cortisol to rule out hormonal causes of hypertension.   # Stress and Lifestyle: Patient reports significant stress related to work and family responsibilities. Expressed interest in improving diet and losing weight. -Refer to a nutritionist for dietary counseling. -Refer to Renaee Munda, care guide, for stress management and health coaching. -Refer to the [facility] program for exercise and diet support.   # Diabetes: Patient has a known history of diabetes. -Continue current management. -Consider GLP1   Follow-up in two weeks to reassess blood pressure control and discuss results  of workup for secondary hypertension.     # Hyperlipidemia: Continue atorvastatin and Zetia   # Coronary vasospasm:  Stable.  No recent symptoms.    Patient Active Problem List   Diagnosis Date Noted   Hyperlipidemia associated with type 2 diabetes mellitus (HCC) 08/15/2022   Pronator syndrome, right 07/11/2021   Seasonal allergic rhinitis due to pollen 06/14/2021   Essential hypertension 01/04/2020   Type 2 diabetes mellitus without complication, without long-term current use of insulin (HCC) 01/04/2020   Class 3 severe obesity due to excess calories with serious comorbidity and body mass index (BMI) of 40.0 to 44.9 in adult [facility]) 01/04/2020   Coronary artery disease due to type 2  diabetes mellitus (HCC) 01/04/2020   Dental caries 01/04/2020   Past Medical History:  Diagnosis Date   Anemia    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   Coronary artery disease    Diabetes mellitus without complication (HCC)    Excessive or frequent menstruation 09/10/2012   Hyperlipidemia    Hypertension    NSTEMI (non-ST elevated myocardial infarction) (HCC) 10/30/2019   PONV (postoperative nausea and vomiting)    with ablation only   Past Surgical History:  Procedure Laterality Date   CHOLECYSTECTOMY  2006   CORONARY ANGIOPLASTY     DILITATION & CURRETTAGE/HYSTROSCOPY WITH HYDROTHERMAL ABLATION N/A 07/10/2013   Procedure: DILATATION & CURETTAGE/HYSTEROSCOPY WITH HYDROTHERMAL ABLATION;  Surgeon: Brock Bad, MD;  Location: [facility];  Service: Gynecology;  Laterality: N/A;   LEFT HEART CATH AND CORONARY ANGIOGRAPHY N/A 11/02/2019   Procedure: LEFT HEART CATH AND CORONARY ANGIOGRAPHY;  Surgeon: Tonny Bollman, MD;  Location: [facility] INVASIVE CV LAB;  Service: Cardiovascular;  Laterality: N/A;   TUBAL LIGATION  1997   WISDOM TOOTH EXTRACTION  1998   Social History   Tobacco Use   Smoking status: Never   Smokeless tobacco: Never  Vaping Use   Vaping status: Never Used  Substance Use Topics   Alcohol use: No   Drug use: No   Social History   Socioeconomic History   Marital status: Significant Other    Spouse name: McCarol Jones   Number of children: 2   Years of education: Not on file   Highest education level: Some college, no degree  Occupational History   Occupation: Engineer, 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   Does not routinely exercise.  In school @ [employer] for nsg.         Lives in a one story home    Right Handed    Social Determinants of Health   Financial Resource Strain: Not on file  Food Insecurity: No Food Insecurity (12/05/2022)   Hunger Vital Sign    Worried About Running Out of Food in the Last Year: Never true    Ran Out of Food in the Last Year: Never true  Transportation Needs: No Transportation Needs (12/05/2022)   PRAPARE - Administrator, Civil Service (Medical): No    Lack of Transportation (Non-Medical): No  Physical Activity: Inactive (12/05/2022)   Exercise  Vital Sign    Days of Exercise per Week: 0 days    Minutes of Exercise per Session: 0 min  Stress: Not on file  Social Connections: Unknown (10/06/2021)   [facility]   Social Network    Social Network: Not on file  Intimate Partner Violence: Unknown (08/29/2021)   Received from [facility]   HITS    Physically Hurt: Not on file    Insult or Talk Down To: Not on file    Threaten Physical Harm: Not on file    Scream or Curse: Not on file   Family Status  Relation Name Status   Mother  Alive   Father  Alive   Mat Aunt  (Not Specified)   Oneal Grout  (Not Specified)   Other  (Not Specified)   Other  (Not Specified)   Other  (Not Specified)   Other  (Not Specified)   Neg Hx  (Not Specified)  No partnership data on file   Family History  Problem Relation Age of Onset   Breast cancer Mother    Hypertension Mother    Hypertension Father    Heart failure Maternal Aunt    Leukemia Paternal Uncle    COPD Other    Hypertension Other    Diabetes Other    Cancer Other    Colon cancer Neg Hx    Colon polyps Neg Hx    Esophageal cancer Neg Hx    Rectal cancer Neg Hx    Stomach cancer Neg Hx    Allergies  Allergen Reactions   Amlodipine Hives   Hydrocodone-Acetaminophen Hives and Itching    And itching Itching and hives   Losartan Potassium-Hctz Other (See Comments)     Dizziness   Tetanus Antitoxin     Arm swelling size of a softball.    Tetanus Toxoids     Arm swelling size of a softball.     Review of Systems  Constitutional: Negative.  Negative for chills, diaphoresis, fever, malaise/fatigue and weight loss.  HENT: Negative.  Negative for congestion, hearing loss, nosebleeds, sore throat and tinnitus.   Eyes: Negative.  Negative for blurred vision, photophobia and redness.  Respiratory: Negative.  Negative for cough, hemoptysis, sputum production, shortness of breath, wheezing and stridor.   Cardiovascular: Negative.  Negative for chest pain, palpitations, orthopnea, claudication, leg swelling and PND.  Gastrointestinal: Negative.  Negative for abdominal pain, blood in stool, constipation, diarrhea, heartburn, nausea and vomiting.  Genitourinary: Negative.  Negative for dysuria, flank pain, frequency, hematuria and urgency.  Musculoskeletal: Negative.  Negative for back pain, falls, joint pain, myalgias and neck pain.  Skin: Negative.  Negative for itching and rash.  Neurological: Negative.  Negative for dizziness, tingling, tremors, sensory change, speech change, focal weakness, seizures, loss of consciousness, weakness and headaches.  Endo/Heme/Allergies: Negative.  Negative for environmental allergies and polydipsia. Does not bruise/bleed easily.  Psychiatric/Behavioral:  Negative for depression, memory loss, substance abuse and suicidal ideas. The patient is not nervous/anxious and does not have insomnia.       Objective:     There were no vitals taken for this visit. BP Readings from Last 3 Encounters:  12/11/22 (!) 170/110  12/05/22 (!) 208/94  08/15/22 128/76   Wt Readings from Last 3 Encounters:  12/11/22 217 lb 3.2 oz (98.5 kg)  12/05/22 217 lb 6.4 oz (98.6 kg)  08/15/22 220 lb (99.8 kg)      Physical Exam Vitals reviewed.  Constitutional:  Appearance: Normal appearance. She is well-developed. She is obese. She is not  diaphoretic.  HENT:     Head: Normocephalic and atraumatic.     Right Ear: Tympanic membrane normal.     Left Ear: Tympanic membrane normal.     Nose: Nose normal. No nasal deformity, septal deviation, mucosal edema or rhinorrhea.     Right Sinus: No maxillary sinus tenderness or frontal sinus tenderness.     Left Sinus: No maxillary sinus tenderness or frontal sinus tenderness.     Mouth/Throat:     Pharynx: No oropharyngeal exudate.     Comments: Refused oral physical exam Eyes:     General: No scleral icterus.    Conjunctiva/sclera: Conjunctivae normal.     Pupils: Pupils are equal, round, and reactive to light.  Neck:     Thyroid: No thyromegaly.     Vascular: No carotid bruit or JVD.     Trachea: Trachea normal. No tracheal tenderness or tracheal deviation.  Cardiovascular:     Rate and Rhythm: Normal rate and regular rhythm.     Chest Wall: PMI is not displaced.     Pulses: Normal pulses. No decreased pulses.     Heart sounds: Normal heart sounds, S1 normal and S2 normal. Heart sounds not distant. No murmur heard.    No systolic murmur is present.     No diastolic murmur is present.     No friction rub. No gallop. No S3 or S4 sounds.  Pulmonary:     Effort: Pulmonary effort is normal. No tachypnea, accessory muscle usage or respiratory distress.     Breath sounds: Normal breath sounds. No stridor. No decreased breath sounds, wheezing, rhonchi or rales.  Chest:     Chest wall: No tenderness.  Abdominal:     General: Abdomen is flat. Bowel sounds are normal. There is no distension.     Palpations: Abdomen is soft. Abdomen is not rigid.     Tenderness: There is no abdominal tenderness. There is no guarding or rebound.  Musculoskeletal:        General: Normal range of motion.     Cervical back: Normal range of motion and neck supple. No edema, erythema or rigidity. No muscular tenderness. Normal range of motion.  Lymphadenopathy:     Head:     Right side of head: No  submental or submandibular adenopathy.     Left side of head: No submental or submandibular adenopathy.     Cervical: No cervical adenopathy.  Skin:    General: Skin is warm and dry.     Coloration: Skin is not pale.     Findings: No rash.     Nails: There is no clubbing.  Neurological:     General: No focal deficit present.     Mental Status: She is alert and oriented to person, place, and time.     Sensory: No sensory deficit.     Motor: No pronator drift.  Psychiatric:        Mood and Affect: Mood normal.        Speech: Speech normal.        Behavior: Behavior normal.      No results found for any visits on 12/18/22.    Last CBC Lab Results  Component Value Date   WBC 7.1 05/06/2022   HGB 11.8 (L) 05/06/2022   HCT 36.1 05/06/2022   MCV 88.7 05/06/2022   MCH 29.0 05/06/2022   RDW 12.4 05/06/2022   PLT  309 05/06/2022   Last metabolic panel Lab Results  Component Value Date   GLUCOSE 129 (H) 07/11/2022   NA 137 07/11/2022   K 3.7 07/11/2022   CL 98 07/11/2022   CO2 23 07/11/2022   BUN 9 07/11/2022   CREATININE 0.70 07/11/2022   EGFR 107 07/11/2022   CALCIUM 9.4 07/11/2022   PROT 7.9 04/12/2021   ALBUMIN 4.0 04/12/2021   LABGLOB 3.9 04/12/2021   AGRATIO 1.0 (L) 04/12/2021   BILITOT 0.2 04/12/2021   ALKPHOS 91 04/12/2021   AST 15 04/12/2021   ALT 8 04/12/2021   ANIONGAP 10 05/06/2022   Last lipids Lab Results  Component Value Date   CHOL 174 07/11/2022   HDL 53 07/11/2022   LDLCALC 109 (H) 07/11/2022   TRIG 60 07/11/2022   CHOLHDL 3.3 07/11/2022   Last hemoglobin A1c Lab Results  Component Value Date   HGBA1C 7.1 (A) 07/11/2022   Last thyroid functions Lab Results  Component Value Date   TSH 1.550 03/16/2022   T4TOTAL 8.7 03/16/2022   Last vitamin D No results found for: "25OHVITD2", "25OHVITD3", "VD25OH" Last vitamin B12 and Folate No results found for: "VITAMINB12", "FOLATE"  The 10-year ASCVD risk score (Arnett DK, et al., 2019) is:  21.4%    Assessment & Plan:   Problem List Items Addressed This Visit   None  No follow-ups on file.    Shan Levans, MD

## 2022-12-18 ENCOUNTER — Ambulatory Visit: Payer: BC Managed Care – PPO | Admitting: Critical Care Medicine

## 2022-12-19 ENCOUNTER — Ambulatory Visit (INDEPENDENT_AMBULATORY_CARE_PROVIDER_SITE_OTHER): Payer: 59 | Admitting: Pharmacist Clinician (PhC)/ Clinical Pharmacy Specialist

## 2022-12-19 ENCOUNTER — Other Ambulatory Visit: Payer: Self-pay

## 2022-12-19 ENCOUNTER — Encounter (HOSPITAL_BASED_OUTPATIENT_CLINIC_OR_DEPARTMENT_OTHER): Payer: Self-pay | Admitting: Pharmacist Clinician (PhC)/ Clinical Pharmacy Specialist

## 2022-12-19 VITALS — BP 203/118 | HR 86

## 2022-12-19 DIAGNOSIS — I10 Essential (primary) hypertension: Secondary | ICD-10-CM | POA: Diagnosis not present

## 2022-12-19 MED ORDER — SPIRONOLACTONE 50 MG PO TABS
50.0000 mg | ORAL_TABLET | Freq: Every day | ORAL | 3 refills | Status: DC
  Filled 2022-12-19 – 2022-12-27 (×2): qty 30, 30d supply, fill #0
  Filled 2023-02-02 – 2023-02-07 (×2): qty 30, 30d supply, fill #1
  Filled 2023-04-04 – 2023-05-08 (×2): qty 30, 30d supply, fill #2

## 2022-12-19 NOTE — Patient Instructions (Signed)
Follow up appointment: Monday August 5 at 4 pm  BRING YOUR MEDICATIONS WITH YOU TO YOUR NEXT APPOINTMENT  Take your BP meds as follows:  INCREASE SPIRONOLACTONE TO 2 TABLETS DAILY   AM:  carvedilol 25 mg, valsartan/hctz 320/25 mg  PM:  carvedilol 25 mg, spironolactone 50 mg  Check your blood pressure at home daily (if able) and keep record of the readings.  Hypertension "High blood pressure"  Hypertension is often called "The Silent Killer." It rarely causes symptoms until it is extremely  high or has done damage to other organs in the body. For this reason, you should have your  blood pressure checked regularly by your physician. We will check your blood pressure  every time you see a provider at one of our offices.   Your blood pressure reading consists of two numbers. Ideally, blood pressure should be  below 120/80. The first ("top") number is called the systolic pressure. It measures the  pressure in your arteries as your heart beats. The second ("bottom") number is called the diastolic pressure. It measures the pressure in your arteries as the heart relaxes between beats.  The benefits of getting your blood pressure under control are enormous. A 10-point  reduction in systolic blood pressure can reduce your risk of stroke by 27% and heart failure by 28%  Your blood pressure goal is < 130/80  To check your pressure at home you will need to:  1. Sit up in a chair, with feet flat on the floor and back supported. Do not cross your ankles or legs. 2. Rest your left arm so that the cuff is about heart level. If the cuff goes on your upper arm,  then just relax the arm on the table, arm of the chair or your lap. If you have a wrist cuff, we  suggest relaxing your wrist against your chest (think of it as Pledging the Flag with the  wrong arm).  3. Place the cuff snugly around your arm, about 1 inch above the crook of your elbow. The  cords should be inside the groove of your  elbow.  4. Sit quietly, with the cuff in place, for about 5 minutes. After that 5 minutes press the power  button to start a reading. 5. Do not talk or move while the reading is taking place.  6. Record your readings on a sheet of paper. Although most cuffs have a memory, it is often  easier to see a pattern developing when the numbers are all in front of you.  7. You can repeat the reading after 1-3 minutes if it is recommended  Make sure your bladder is empty and you have not had caffeine or tobacco within the last 30 min  Always bring your blood pressure log with you to your appointments. If you have not brought your monitor in to be double checked for accuracy, please bring it to your next appointment.  You can find a list of quality blood pressure cuffs at [email or web address]

## 2022-12-19 NOTE — Progress Notes (Unsigned)
Office Visit    Patient Name: Amy [name] Date of Encounter: 12/19/2022  Primary Care Provider:  Storm Frisk, MD Primary Cardiologist:  Chrystie Nose, MD  Chief Complaint    Hypertension - Advanced hypertension clinic  Past Medical History   CAD 2021 NSTEMI 2/2 vasospasm  DM 11/22 A1c 6.6, on empagliflozin 10  HLD 2/24 LDL 109, on atorvastatin 40          Allergies  Allergen Reactions   Amlodipine Hives   Hydrocodone-Acetaminophen Hives and Itching    And itching Itching and hives   Losartan Potassium-Hctz Other (See Comments)    Dizziness   Tetanus Antitoxin     Arm swelling size of a softball.    Tetanus Toxoids     Arm swelling size of a softball.     History of Present Illness    Amy [name] is a 47 y.o. female patient who was referred to the Advanced Hypertension Clinic by Dr. Shan Levans.   Dr. Duke Salvia saw her two weeks ago, at which time her pressure was 208/94.  Patient reported that the diagnoses was fairly recent and she has a lot of stress with work.    Today she returns for follow up.  States no problems with her medications, gets them from [facility].  I am unable to track any fills that may have occurred and when I call [facility] they have no record of filling anything related to BP.   BP in the office today is similar to what it was when she saw Dr. Duke Salvia 2 weeks ago.    Blood Pressure Goal:  130/80  Current Medications: carvedilol 25 mg bid, spironolactone 25 mg every day, valsartan hctz 320/25 mg qd  Adherence Assessment  Do you ever forget to take your medication? [] Yes [x] No  Do you ever skip doses due to side effects? [] Yes [x] No  Do you have trouble affording your medicines? [] Yes [x] No  Are you ever unable to pick up your medication due to transportation difficulties? [] Yes [x] No   Adherence strategy: 7 day pill minder  Previously tried:  amlodipine - hives, losartan - dizziness    Family Hx: father has hypertension, maternal aunt with CHF, mother had htn, breast cancer, thinks 1 sister Half mother with hypertension  Social Hx:      Tobacco: no  Alcohol: no  Caffeine:  no regular caffeine  Diet:  mostly eats at home, occasionally at [employer], where she works, likes salmon and roasted brussels sprouts; no regular snacking; tries to avoid salt; more fruit for snacking, enjoys salad, watermelon;     Exercise: starting PREP class tomorrow  Home BP readings:  home, wrist Equate model - against chest  203/118  HR86   Accessory Clinical Findings    Lab Results  Component Value Date   CREATININE 0.70 07/11/2022   BUN 9 07/11/2022   NA 137 07/11/2022   K 3.7 07/11/2022   CL 98 07/11/2022   CO2 23 07/11/2022   Lab Results  Component Value Date   ALT 8 04/12/2021   AST 15 04/12/2021   ALKPHOS 91 04/12/2021   BILITOT 0.2 04/12/2021   Lab Results  Component Value Date   HGBA1C 7.1 (A) 07/11/2022    Screening for Secondary Hypertension: { Click here to document screening for secondary causes of HTN  :1}     12/05/2022   11:59 AM  Causes  Drugs/Herbals Screened     - Comments rare caffeine.  No EtOH.  limits salt.  Renovascular HTN Screened     - Comments Check renal artery Dopplers  Sleep Apnea Screened     - Comments no symptoms  Thyroid Disease Screened  Hyperaldosteronism Screened     - Comments check renin and aldosterone  Pheochromocytoma N/A  Cushing's Syndrome Screened     - Comments check cortisol  Hyperparathyroidism N/A  Coarctation of the Aorta Screened     - Comments BP symmetric  Compliance Screened     - Comments meds not filled    Relevant Labs/Studies:    Latest Ref Rng & Units 07/11/2022    9:31 AM 05/06/2022    8:51 AM 01/30/2022   10:37 AM  Basic Labs  Sodium 134 - 144 mmol/L 137  135  134   Potassium 3.5 - 5.2 mmol/L 3.7  3.6  4.5   Creatinine 0.57 - 1.00 mg/dL 5.36  6.44  0.34        Latest Ref Rng & Units  03/16/2022    9:33 AM  Thyroid   TSH 0.450 - 4.500 uIU/mL 1.550        Latest Ref Rng & Units 12/07/2022    9:28 AM  Renin/Aldosterone   Aldosterone 0.0 - 30.0 ng/dL 6.3   Aldos/Renin Ratio 0.0 - 30.0 5.2              12/05/2022   12:26 PM  Renovascular   Renal Artery [place] Completed Yes      Home Medications    Current Outpatient Medications  Medication Sig Dispense Refill   spironolactone (ALDACTONE) 50 MG tablet Take 1 tablet (50 mg total) by mouth daily. 90 tablet 3   aspirin 81 MG EC tablet Take 1 tablet (81 mg total) by mouth daily. 90 tablet 3   atorvastatin (LIPITOR) 40 MG tablet Take 1 tablet (40 mg total) by mouth daily. 90 tablet 3   carvedilol (COREG) 25 MG tablet Take 1 tablet (25 mg total) by mouth 2 (two) times daily with a meal. 120 tablet 2   empagliflozin (JARDIANCE) 10 MG TABS tablet Take 1 tablet (10 mg total) by mouth daily. 30 tablet 3   ezetimibe (ZETIA) 10 MG tablet Take 1 tablet (10 mg total) by mouth daily. 90 tablet 3   fexofenadine (ALLEGRA) 180 MG tablet Take 180 mg by mouth daily.     fluticasone (FLONASE) 50 MCG/ACT nasal spray Place 2 sprays into both nostrils daily. 16 g 6   Multiple Vitamin (MULTIVITAMIN WITH MINERALS) TABS tablet Take 1 tablet by mouth daily.     nitroGLYCERIN (NITROSTAT) 0.4 MG SL tablet Place 1 tablet (0.4 mg total) under the tongue every 5 (five) minutes x 3 doses as needed for chest pain. 25 tablet 11   nortriptyline (PAMELOR) 25 MG capsule Take 1 capsule (25 mg total) by mouth at bedtime to prevent pain the next day takes as needed. 30 capsule 2   valsartan-hydrochlorothiazide (DIOVAN-HCT) 320-25 MG tablet Take 1 tablet by mouth daily. 90 tablet 3   No current facility-administered medications for this visit.     Assessment & Plan   HYPERTENSION CONTROL Vitals:   12/19/22 1440 12/19/22 1447  BP: (!) 193/119 (!) 203/118    The patient's blood pressure is elevated above target today. {Click here if intervention needs  to be changed Refresh Note :1}  In order to address the patient's elevated BP: A current anti-hypertensive medication was adjusted today.; Blood pressure will be monitored at home to determine  if medication changes need to be made.      Essential hypertension Assessment: BP is uncontrolled in office BP 193/119 mmHg;  above the goal (<130/80). Denies issues with medication compliance Tolerates current medications well without any side effects Denies SOB, palpitation, chest pain, headaches,or swelling Reiterated the importance of regular exercise and low salt diet   Plan:  Increase spironolactone to 50 mg daily in the evening Continue taking all other medications Patient to keep record of BP readings with heart rate and report to [place] at the next visit Patient to follow up with PharmD in 2 weeks  Labs ordered today:  will get BMET in 2 weeks at f/u She was asked to bring all medications to f/u appointment Put in request for patient to get medications with adherence packaging through [facility] pharmacy   Phillips Hay PharmD CPP [facility]  [address] [place], [place] [address] [phone]

## 2022-12-19 NOTE — Assessment & Plan Note (Addendum)
Assessment: BP is uncontrolled in office BP 193/119 mmHg;  above the goal (<130/80). Denies issues with medication compliance Tolerates current medications well without any side effects Denies SOB, palpitation, chest pain, headaches,or swelling Reiterated the importance of regular exercise and low salt diet   Plan:  Increase spironolactone to 50 mg daily in the evening Continue taking all other medications Patient to keep record of BP readings with heart rate and report to [place] at the next visit Patient to follow up with PharmD in 2 weeks  Labs ordered today:  will get BMET in 2 weeks at f/u She was asked to bring all medications to f/u appointment Put in request for patient to get medications with adherence packaging through [facility] pharmacy

## 2022-12-20 ENCOUNTER — Other Ambulatory Visit: Payer: Self-pay

## 2022-12-21 ENCOUNTER — Telehealth: Payer: Self-pay

## 2022-12-21 ENCOUNTER — Telehealth: Payer: Self-pay | Admitting: Pharmacist Clinician (PhC)/ Clinical Pharmacy Specialist

## 2022-12-21 ENCOUNTER — Other Ambulatory Visit (HOSPITAL_COMMUNITY): Payer: Self-pay

## 2022-12-21 NOTE — Telephone Encounter (Signed)
Pharmacy Patient Advocate Encounter   Received notification from Physician's Office /PHARMD KRISTIN that prior authorization for JARDIANCE is required/requested.   Insurance verification completed.   The patient is insured through [employer] .   Per test claim: PA required; PA submitted to [employer] [facility] via CoverMyMeds Key/confirmation #/[id number]       Status is pending

## 2022-12-21 NOTE — Telephone Encounter (Signed)
Please do PA for [facility]

## 2022-12-21 NOTE — Telephone Encounter (Signed)
A prior authorization for [place] was submitted to insurance today via  CoverMyMeds Key: [id number]

## 2022-12-24 ENCOUNTER — Other Ambulatory Visit: Payer: Self-pay

## 2022-12-26 ENCOUNTER — Other Ambulatory Visit: Payer: Self-pay

## 2022-12-26 ENCOUNTER — Other Ambulatory Visit (HOSPITAL_COMMUNITY): Payer: Self-pay

## 2022-12-26 NOTE — Telephone Encounter (Signed)
Sorry missed the last part of that. No the 36 is everything but the Jardiance. I won't know the cost of that until the PA goes through unfortunately.   Get Outlook for iOS  From: Amy [name] [email or web address]> Sent: Thursday, December 20, 2022 3:58:20 PM To: Amy [name] [email or web address]> Subject: Re: adherence packaging    Yes she can definitely pick up there. Thank you!  From: Amy [name] [email or web address]> Sent: Thursday, December 20, 2022 3:49 PM To: Amy [name] [email or web address]> Subject: RE: adherence packaging    Thanks for that update.  If we do adherence packaging, can she pick it up at [facility] pharmacy?  I'll get the tech started on a PA for the Jardiance, then call [name] and talk about the costs  (does the $36 include Jardiance?)   Amy [name]   From: Amy [name] [email or web address]>  Sent: Thursday, December 20, 2022 2:35 PM To: Amy [name] [email or web address]> Subject: Re: adherence packaging   Hey! Yeah I'm checking her chart and it doesn't look like she's gotten anything since nearly a year ago. She had a bunch of meds done in February but she never came to pick them up. It looks like the jardiance needs a PA and the total for all the meds is 36$ Will the patient come and pick them up at that price?   Get Outlook for iOS  From: Amy [name] [email or web address]> Sent: Wednesday, December 19, 2022 3:16:43 PM To: Amy [name], Amy [name] [email or web address]> Subject: adherence packaging    Hi Amy [name]   I have another candidate for adherence packaging.  MR [id number] Amy [name].  I'm a little concerned as to whether she is actually taking any medication.  says she gets everything from [facility] now, but I can't seem to track any fill dates.   BP today was 193/119 and she supposedly hasn't missed any meds.  I'm increasing spironolactone  to 50 mg daily in the evenings (trying to divide her meds up a bit).  I'll put in a new prescription for that today, but let me know what else you might need from me.   Thanks,  Amy [name]

## 2022-12-26 NOTE — Telephone Encounter (Signed)
See other encounter.

## 2022-12-26 NOTE — Telephone Encounter (Signed)
/  Per [employer] P/A is on file flor Jardiance until 12/22/23

## 2022-12-26 NOTE — Telephone Encounter (Signed)
Spoke with patient and she would like to pick up the adherence packaged meds in the next few days.  She is aware of the cost $46/month.  Would like to pick up at [facility]

## 2022-12-26 NOTE — Progress Notes (Signed)
[facility] PREP Weekly Session  Patient Details  Name: Amy [name] MRN: [id number] Date of Birth: 09/05/1975 Age: 47 y.o. PCP: Storm Frisk, MD  Vitals:   12/25/22 1800  Weight: 211 lb 12.8 oz (96.1 kg)     [facility] Weekly seesion - 12/26/22 0900       [facility] "PREP" Location   [facility] "PREP" Location [facility]      Weekly Session   Topic Discussed Importance of resistance training;Other ways to be active    Minutes exercised this week 60 minutes    Classes attended to date 2             Bonnye Fava 12/26/2022, 9:25 AM  [facility] PREP Weekly Session  Patient Details  Name: Amy [name] MRN: [id number] Date of Birth: 1975-10-09 Age: 47 y.o. PCP: Storm Frisk, MD  Vitals:   12/25/22 1800  Weight: 211 lb 12.8 oz (96.1 kg)     [facility] Weekly seesion - 12/26/22 0900       [facility] "PREP" Location   [facility] "PREP" Location [facility]      Weekly Session   Topic Discussed Importance of resistance training;Other ways to be active    Minutes exercised this week 60 minutes    Classes attended to date 2             Bonnye Fava 12/26/2022, 9:25 AM

## 2022-12-27 ENCOUNTER — Other Ambulatory Visit: Payer: Self-pay

## 2022-12-28 ENCOUNTER — Other Ambulatory Visit: Payer: Self-pay

## 2022-12-31 ENCOUNTER — Ambulatory Visit (INDEPENDENT_AMBULATORY_CARE_PROVIDER_SITE_OTHER): Payer: 59 | Admitting: Pharmacist Clinician (PhC)/ Clinical Pharmacy Specialist

## 2022-12-31 ENCOUNTER — Encounter (HOSPITAL_BASED_OUTPATIENT_CLINIC_OR_DEPARTMENT_OTHER): Payer: Self-pay | Admitting: Pharmacist Clinician (PhC)/ Clinical Pharmacy Specialist

## 2022-12-31 VITALS — BP 184/114 | HR 86 | Ht 62.0 in | Wt 207.0 lb

## 2022-12-31 DIAGNOSIS — I10 Essential (primary) hypertension: Secondary | ICD-10-CM | POA: Diagnosis not present

## 2022-12-31 NOTE — Patient Instructions (Signed)
Follow up appointment: Tuesday Aug 20 at 4 pm  Take your BP meds as follows: Start the medication envelopes twice daily  Check your blood pressure at home daily (if able) and keep record of the readings.  Hypertension "High blood pressure"  Hypertension is often called "The Silent Killer." It rarely causes symptoms until it is extremely  high or has done damage to other organs in the body. For this reason, you should have your  blood pressure checked regularly by your physician. We will check your blood pressure  every time you see a provider at one of our offices.   Your blood pressure reading consists of two numbers. Ideally, blood pressure should be  below 120/80. The first ("top") number is called the systolic pressure. It measures the  pressure in your arteries as your heart beats. The second ("bottom") number is called the diastolic pressure. It measures the pressure in your arteries as the heart relaxes between beats.  The benefits of getting your blood pressure under control are enormous. A 10-point  reduction in systolic blood pressure can reduce your risk of stroke by 27% and heart failure by 28%  Your blood pressure goal is <130/80  To check your pressure at home you will need to:  1. Sit up in a chair, with feet flat on the floor and back supported. Do not cross your ankles or legs. 2. Rest your left arm so that the cuff is about heart level. If the cuff goes on your upper arm,  then just relax the arm on the table, arm of the chair or your lap. If you have a wrist cuff, we  suggest relaxing your wrist against your chest (think of it as Pledging the Flag with the  wrong arm).  3. Place the cuff snugly around your arm, about 1 inch above the crook of your elbow. The  cords should be inside the groove of your elbow.  4. Sit quietly, with the cuff in place, for about 5 minutes. After that 5 minutes press the power  button to start a reading. 5. Do not talk or move while  the reading is taking place.  6. Record your readings on a sheet of paper. Although most cuffs have a memory, it is often  easier to see a pattern developing when the numbers are all in front of you.  7. You can repeat the reading after 1-3 minutes if it is recommended  Make sure your bladder is empty and you have not had caffeine or tobacco within the last 30 min  Always bring your blood pressure log with you to your appointments. If you have not brought your monitor in to be double checked for accuracy, please bring it to your next appointment.  You can find a list of quality blood pressure cuffs at validatebp.org

## 2022-12-31 NOTE — Assessment & Plan Note (Signed)
Assessment: BP is uncontrolled in office BP 184/114 mmHg;  above the goal (<130/80). Did not do repeat reading, as patient has not had meds in some time Denies SOB, palpitation, chest pain, headaches,or swelling Reiterated the importance of regular exercise and low salt diet   Plan:  Go to [facility] to pick up adherence packaged medications Take each envelop of meds as directed on the container Patient to keep record of BP readings with heart rate and report to [place] at the next visit Patient to follow up with PharmD in 2 weeks  Labs ordered today:  none - will draw at f/u visit if we can confirm that she is taking medications

## 2022-12-31 NOTE — Progress Notes (Unsigned)
Office Visit    Patient Name: Amy [name] Date of Encounter: 12/31/2022  Primary Care Provider:  Storm Frisk, MD Primary Cardiologist:  Chrystie Nose, MD  Chief Complaint    Hypertension - Advanced hypertension clinic  Past Medical History   CAD 2021 NSTEMI 2/2 vasospasm  DM 11/22 A1c 6.6, on empagliflozin 10  HLD 2/24 LDL 109, on atorvastatin 40          Allergies  Allergen Reactions   Amlodipine Hives   Hydrocodone-Acetaminophen Hives and Itching    And itching Itching and hives   Losartan Potassium-Hctz Other (See Comments)    Dizziness   Tetanus Antitoxin     Arm swelling size of a softball.    Tetanus Toxoids     Arm swelling size of a softball.     History of Present Illness    Amy [name] is a 47 y.o. female patient who was referred to the Advanced Hypertension Clinic by Dr. Shan Levans.   Dr. Duke Salvia saw her two weeks ago, at which time her pressure was 208/94.  Patient reported that the diagnoses was fairly recent and she has a lot of stress with work.   I saw her two weeks later for follow up and her pressures were essentially unchanged.  In reaching out to pharmacy, it appears that she has not been picking up medications for some time,and some of them not at all.  I worked with the central fill pharmacy and we got her set up with monthly adherence packaging.    Today she returns for follow up.  The adherence packaging of medications was ready last Thursday, but as of today she has not picked up the medication.  She plans on doing that tomorrow on her lunch break.    Blood Pressure Goal:  130/80  Current Medications: carvedilol 25 mg bid, spironolactone 25 mg every day, valsartan hctz 320/25 mg qd  Adherence Assessment  Do you ever forget to take your medication? [] Yes [x] No  Do you ever skip doses due to side effects? [] Yes [x] No  Do you have trouble affording your medicines? [] Yes [x] No  Are you ever unable to pick up  your medication due to transportation difficulties? [] Yes [x] No   Adherence strategy: 7 day pill minder  Previously tried:  amlodipine - hives, losartan - dizziness   Family Hx: father has hypertension, maternal aunt with CHF, mother had htn, breast cancer, thinks 1 sister Half mother with hypertension  Social Hx:      Tobacco: no  Alcohol: no  Caffeine:  no regular caffeine  Diet:  mostly eats at home, occasionally at [employer], where she works, likes salmon and roasted brussels sprouts; no regular snacking; tries to avoid salt; more fruit for snacking, enjoys salad, watermelon;     Exercise: starting PREP class tomorrow - loves PREP class- about 6 peoples  Home BP readings:  home, wrist Equate model - against chest  203/118  HR86   Accessory Clinical Findings    Lab Results  Component Value Date   CREATININE 0.70 07/11/2022   BUN 9 07/11/2022   NA 137 07/11/2022   K 3.7 07/11/2022   CL 98 07/11/2022   CO2 23 07/11/2022   Lab Results  Component Value Date   ALT 8 04/12/2021   AST 15 04/12/2021   ALKPHOS 91 04/12/2021   BILITOT 0.2 04/12/2021   Lab Results  Component Value Date   HGBA1C 7.1 (A) 07/11/2022    Screening for Secondary  Hypertension:      12/05/2022   11:59 AM  Causes  Drugs/Herbals Screened     - Comments rare caffeine.  No EtOH.  limits salt.  Renovascular HTN Screened     - Comments Check renal artery Dopplers  Sleep Apnea Screened     - Comments no symptoms  Thyroid Disease Screened  Hyperaldosteronism Screened     - Comments check renin and aldosterone  Pheochromocytoma N/A  Cushing's Syndrome Screened     - Comments check cortisol  Hyperparathyroidism N/A  Coarctation of the Aorta Screened     - Comments BP symmetric  Compliance Screened     - Comments meds not filled    Relevant Labs/Studies:    Latest Ref Rng & Units 07/11/2022    9:31 AM 05/06/2022    8:51 AM 01/30/2022   10:37 AM  Basic Labs  Sodium 134 - 144 mmol/L 137  135   134   Potassium 3.5 - 5.2 mmol/L 3.7  3.6  4.5   Creatinine 0.57 - 1.00 mg/dL 4.78  2.95  6.21        Latest Ref Rng & Units 03/16/2022    9:33 AM  Thyroid   TSH 0.450 - 4.500 uIU/mL 1.550        Latest Ref Rng & Units 12/07/2022    9:28 AM  Renin/Aldosterone   Aldosterone 0.0 - 30.0 ng/dL 6.3   Aldos/Renin Ratio 0.0 - 30.0 5.2              12/05/2022   12:26 PM  Renovascular   Renal Artery [place] Completed Yes      Home Medications    Current Outpatient Medications  Medication Sig Dispense Refill   aspirin 81 MG EC tablet Take 1 tablet (81 mg total) by mouth daily. 90 tablet 3   atorvastatin (LIPITOR) 40 MG tablet Take 1 tablet (40 mg total) by mouth daily. 90 tablet 3   carvedilol (COREG) 25 MG tablet Take 1 tablet (25 mg total) by mouth 2 (two) times daily with a meal. 120 tablet 2   empagliflozin (JARDIANCE) 10 MG TABS tablet Take 1 tablet (10 mg total) by mouth daily. 30 tablet 3   ezetimibe (ZETIA) 10 MG tablet Take 1 tablet (10 mg total) by mouth daily. 90 tablet 3   fexofenadine (ALLEGRA) 180 MG tablet Take 180 mg by mouth daily.     fluticasone (FLONASE) 50 MCG/ACT nasal spray Place 2 sprays into both nostrils daily. 16 g 6   Multiple Vitamin (MULTIVITAMIN WITH MINERALS) TABS tablet Take 1 tablet by mouth daily.     nitroGLYCERIN (NITROSTAT) 0.4 MG SL tablet Place 1 tablet (0.4 mg total) under the tongue every 5 (five) minutes x 3 doses as needed for chest pain. 25 tablet 11   nortriptyline (PAMELOR) 25 MG capsule Take 1 capsule (25 mg total) by mouth at bedtime to prevent pain the next day takes as needed. 30 capsule 2   spironolactone (ALDACTONE) 50 MG tablet Take 1 tablet (50 mg total) by mouth daily. 90 tablet 3   valsartan-hydrochlorothiazide (DIOVAN-HCT) 320-25 MG tablet Take 1 tablet by mouth daily. 90 tablet 3   No current facility-administered medications for this visit.     Assessment & Plan   184/114  Essential hypertension Assessment: BP is  uncontrolled in office BP 184/114 mmHg;  above the goal (<130/80). Did not do repeat reading, as patient has not had meds in some time Denies SOB, palpitation, chest pain, headaches,or  swelling Reiterated the importance of regular exercise and low salt diet   Plan:  Go to [facility] to pick up adherence packaged medications Take each envelop of meds as directed on the container Patient to keep record of BP readings with heart rate and report to [place] at the next visit Patient to follow up with PharmD in 2 weeks  Labs ordered today:  none - will draw at f/u visit if we can confirm that she is taking medications   Phillips Hay PharmD CPP [facility]  [address] [place], [place] [address] [phone]

## 2023-01-02 NOTE — Progress Notes (Signed)
[facility] PREP Weekly Session  Patient Details  Name: Amy [name] MRN: [id number] Date of Birth: 1976-01-19 Age: 47 y.o. PCP: Storm Frisk, MD  Vitals:   01/01/23 1800  Weight: 208 lb 12.8 oz (94.7 kg)     [facility] Weekly seesion - 01/02/23 1000       [facility] "PREP" Location   [facility] "PREP" Location [facility]      Weekly Session   Topic Discussed Healthy eating tips    Minutes exercised this week 18 minutes    Classes attended to date 4             Pam Jerral Bonito 01/02/2023, 10:04 AM

## 2023-01-03 ENCOUNTER — Other Ambulatory Visit: Payer: Self-pay

## 2023-01-04 ENCOUNTER — Other Ambulatory Visit: Payer: Self-pay

## 2023-01-09 ENCOUNTER — Ambulatory Visit (INDEPENDENT_AMBULATORY_CARE_PROVIDER_SITE_OTHER): Payer: 59

## 2023-01-09 DIAGNOSIS — I10 Essential (primary) hypertension: Secondary | ICD-10-CM

## 2023-01-15 ENCOUNTER — Ambulatory Visit (INDEPENDENT_AMBULATORY_CARE_PROVIDER_SITE_OTHER): Payer: 59 | Admitting: Pharmacist Clinician (PhC)/ Clinical Pharmacy Specialist

## 2023-01-15 VITALS — BP 168/110 | HR 76

## 2023-01-15 DIAGNOSIS — I10 Essential (primary) hypertension: Secondary | ICD-10-CM | POA: Diagnosis not present

## 2023-01-15 NOTE — Progress Notes (Unsigned)
Office Visit    Patient Name: Amy [name] Date of Encounter: 01/16/2023  Primary Care Provider:  Storm Frisk, MD Primary Cardiologist:  Chrystie Nose, MD  Chief Complaint    Hypertension - Advanced hypertension clinic  Past Medical History   CAD 2021 NSTEMI 2/2 vasospasm  DM 11/22 A1c 6.6, on empagliflozin 10  HLD 2/24 LDL 109, on atorvastatin 40    Allergies  Allergen Reactions   Amlodipine Hives   Hydrocodone-Acetaminophen Hives and Itching    And itching Itching and hives   Losartan Potassium-Hctz Other (See Comments)    Dizziness   Tetanus Antitoxin     Arm swelling size of a softball.    Tetanus Toxoids     Arm swelling size of a softball.     History of Present Illness    Amy [name] is a 47 y.o. female patient who was referred to the [facility] by Dr. Shan Levans.   Dr. Duke Salvia saw her two weeks ago, at which time her pressure was 208/94.  Patient reported that the diagnoses was fairly recent and she has a lot of stress with work.   I saw her two weeks later for follow up and her pressures were essentially unchanged.  In reaching out to pharmacy, it appears that she has not been picking up medications for some time,and some of them not at all.  I worked with the central fill pharmacy and we got her set up with monthly adherence packaging.  She started that since I saw her last, and states that it is working well.    Today she returns for follow up.  She started using the adherence packaging and feels that it is making things easier for her.  States her life has been rather stressful lately, but she will be starting a new job in a few weeks and is looking forward to the change.    Blood Pressure Goal:  130/80  Current Medications: carvedilol 25 mg bid, spironolactone 50 mg every day, valsartan hctz 320/25 mg qd  Adherence Assessment  Do you ever forget to take your medication? [] Yes [x] No  Do you ever skip doses  due to side effects? [] Yes [x] No  Do you have trouble affording your medicines? [] Yes [x] No  Are you ever unable to pick up your medication due to transportation difficulties? [] Yes [x] No   Adherence strategy: 7 day pill minder  Previously tried:  amlodipine - hives, losartan - dizziness   Family Hx: father has hypertension, maternal aunt with CHF, mother had htn, breast cancer, thinks 1 sister Half mother with hypertension  Social Hx:      Tobacco: no  Alcohol: no  Caffeine:  no regular caffeine  Diet:  mostly eats at home, occasionally at [employer], where she works, likes salmon and roasted brussels sprouts; no regular snacking; tries to avoid salt; more fruit for snacking, enjoys salad, watermelon;     Exercise: starting PREP class tomorrow - loves PREP class- about 6 peoples  Home BP readings:  home, wrist Equate model - against chest  203/118  HR86   Accessory Clinical Findings    Lab Results  Component Value Date   CREATININE 0.70 07/11/2022   BUN 9 07/11/2022   NA 137 07/11/2022   K 3.7 07/11/2022   CL 98 07/11/2022   CO2 23 07/11/2022   Lab Results  Component Value Date   ALT 8 04/12/2021   AST 15 04/12/2021   ALKPHOS 91 04/12/2021  BILITOT 0.2 04/12/2021   Lab Results  Component Value Date   HGBA1C 7.1 (A) 07/11/2022    Screening for Secondary Hypertension:      12/05/2022   11:59 AM  Causes  Drugs/Herbals Screened     - Comments rare caffeine.  No EtOH.  limits salt.  Renovascular HTN Screened     - Comments Check renal artery Dopplers  Sleep Apnea Screened     - Comments no symptoms  Thyroid Disease Screened  Hyperaldosteronism Screened     - Comments check renin and aldosterone  Pheochromocytoma N/A  Cushing's Syndrome Screened     - Comments check cortisol  Hyperparathyroidism N/A  Coarctation of the Aorta Screened     - Comments BP symmetric  Compliance Screened     - Comments meds not filled    Relevant Labs/Studies:    Latest  Ref Rng & Units 07/11/2022    9:31 AM 05/06/2022    8:51 AM 01/30/2022   10:37 AM  Basic Labs  Sodium 134 - 144 mmol/L 137  135  134   Potassium 3.5 - 5.2 mmol/L 3.7  3.6  4.5   Creatinine 0.57 - 1.00 mg/dL 1.61  0.96  0.45        Latest Ref Rng & Units 03/16/2022    9:33 AM  Thyroid   TSH 0.450 - 4.500 uIU/mL 1.550        Latest Ref Rng & Units 12/07/2022    9:28 AM  Renin/Aldosterone   Aldosterone 0.0 - 30.0 ng/dL 6.3   Aldos/Renin Ratio 0.0 - 30.0 5.2              01/09/2023    9:53 AM  Renovascular   Renal Artery [place] Completed Yes      Home Medications    Current Outpatient Medications  Medication Sig Dispense Refill   aspirin 81 MG EC tablet Take 1 tablet (81 mg total) by mouth daily. 90 tablet 3   atorvastatin (LIPITOR) 40 MG tablet Take 1 tablet (40 mg total) by mouth daily. 90 tablet 3   carvedilol (COREG) 25 MG tablet Take 1 tablet (25 mg total) by mouth 2 (two) times daily with a meal. 120 tablet 2   empagliflozin (JARDIANCE) 10 MG TABS tablet Take 1 tablet (10 mg total) by mouth daily. 30 tablet 3   ezetimibe (ZETIA) 10 MG tablet Take 1 tablet (10 mg total) by mouth daily. 90 tablet 3   fexofenadine (ALLEGRA) 180 MG tablet Take 180 mg by mouth daily.     fluticasone (FLONASE) 50 MCG/ACT nasal spray Place 2 sprays into both nostrils daily. 16 g 6   Multiple Vitamin (MULTIVITAMIN WITH MINERALS) TABS tablet Take 1 tablet by mouth daily.     nitroGLYCERIN (NITROSTAT) 0.4 MG SL tablet Place 1 tablet (0.4 mg total) under the tongue every 5 (five) minutes x 3 doses as needed for chest pain. 25 tablet 11   nortriptyline (PAMELOR) 25 MG capsule Take 1 capsule (25 mg total) by mouth at bedtime to prevent pain the next day takes as needed. 30 capsule 2   spironolactone (ALDACTONE) 50 MG tablet Take 1 tablet (50 mg total) by mouth daily. 90 tablet 3   valsartan-hydrochlorothiazide (DIOVAN-HCT) 320-25 MG tablet Take 1 tablet by mouth daily. 90 tablet 3   No current  facility-administered medications for this visit.     Assessment & Plan     Essential hypertension Assessment: BP is uncontrolled in office BP 168/110 mmHg; while  above the goal (<130/80), it did drop to 168 systolic on second reading.   While she is frustrated with continued elevated readings, I explained that this is already better than previous readings, and that we will continue to work to get it down Tolerates current medications well without any side effects Denies SOB, palpitation, chest pain, headaches,or swelling Reiterated the importance of regular exercise and low salt diet   Plan:  Will confirm if she has 25 mg or 50 mg spironolactone in her adherence packaging.  If at 25 mg, will increase to 50 mg with next box. (In about 2 weeks).   Because of confusion and compliance concerns, do not want to give her bottle of tablets to take on top of packaged meds. Continue taking all packaged medications Patient to keep record of BP readings with heart rate and report to [place] at the next visit Patient to follow up with PharmD in 1 month  Labs ordered today:  none - will do BMET at next visit   Phillips Hay PharmD CPP [facility]  [address] [place], [place] [address] [phone]

## 2023-01-15 NOTE — Patient Instructions (Signed)
Follow up appointment: with Dr. Rennis Golden at [facility] Oct 4 at 9:30 am  Take your BP meds as follows: Continue with your medication envelops.  I am going to have the  pharmacy increase the spironolactone in your next box.    Check your blood pressure at home daily (if able) and keep record of the readings.  Hypertension "High blood pressure"  Hypertension is often called "The Silent Killer." It rarely causes symptoms until it is extremely  high or has done damage to other organs in the body. For this reason, you should have your  blood pressure checked regularly by your physician. We will check your blood pressure  every time you see a provider at one of our offices.   Your blood pressure reading consists of two numbers. Ideally, blood pressure should be  below 120/80. The first ("top") number is called the systolic pressure. It measures the  pressure in your arteries as your heart beats. The second ("bottom") number is called the diastolic pressure. It measures the pressure in your arteries as the heart relaxes between beats.  The benefits of getting your blood pressure under control are enormous. A 10-point  reduction in systolic blood pressure can reduce your risk of stroke by 27% and heart failure by 28%  Your blood pressure goal is < 140/90 (first goal)  To check your pressure at home you will need to:  1. Sit up in a chair, with feet flat on the floor and back supported. Do not cross your ankles or legs. 2. Rest your left arm so that the cuff is about heart level. If the cuff goes on your upper arm,  then just relax the arm on the table, arm of the chair or your lap. If you have a wrist cuff, we  suggest relaxing your wrist against your chest (think of it as Pledging the Flag with the  wrong arm).  3. Place the cuff snugly around your arm, about 1 inch above the crook of your elbow. The  cords should be inside the groove of your elbow.  4. Sit quietly, with the cuff in place,  for about 5 minutes. After that 5 minutes press the power  button to start a reading. 5. Do not talk or move while the reading is taking place.  6. Record your readings on a sheet of paper. Although most cuffs have a memory, it is often  easier to see a pattern developing when the numbers are all in front of you.  7. You can repeat the reading after 1-3 minutes if it is recommended  Make sure your bladder is empty and you have not had caffeine or tobacco within the last 30 min  Always bring your blood pressure log with you to your appointments. If you have not brought your monitor in to be double checked for accuracy, please bring it to your next appointment.  You can find a list of quality blood pressure cuffs at validatebp.org

## 2023-01-16 NOTE — Assessment & Plan Note (Addendum)
Assessment: BP is uncontrolled in office BP 168/110 mmHg; while above the goal (<130/80), it did drop to 168 systolic on second reading.   While she is frustrated with continued elevated readings, I explained that this is already better than previous readings, and that we will continue to work to get it down Tolerates current medications well without any side effects Denies SOB, palpitation, chest pain, headaches,or swelling Reiterated the importance of regular exercise and low salt diet   Plan:  Will confirm if she has 25 mg or 50 mg spironolactone in her adherence packaging.  If at 25 mg, will increase to 50 mg with next box. (In about 2 weeks).   Because of confusion and compliance concerns, do not want to give her bottle of tablets to take on top of packaged meds. Continue taking all packaged medications Patient to keep record of BP readings with heart rate and report to [place] at the next visit Patient to follow up with PharmD in 1 month  Labs ordered today:  none - will do BMET at next visit

## 2023-01-17 ENCOUNTER — Encounter (HOSPITAL_BASED_OUTPATIENT_CLINIC_OR_DEPARTMENT_OTHER): Payer: Self-pay | Admitting: Pharmacist Clinician (PhC)/ Clinical Pharmacy Specialist

## 2023-01-17 ENCOUNTER — Other Ambulatory Visit: Payer: Self-pay

## 2023-01-23 ENCOUNTER — Telehealth: Payer: Self-pay

## 2023-01-23 NOTE — Progress Notes (Deleted)
Reached out via text to pt reference missed PREP classes. Reports to know working in evening and needs daytime instead.  Advised will check back with her when I start  class in Oct MW 230p-345p to confirm.

## 2023-01-23 NOTE — Telephone Encounter (Signed)
Reached out to pt reference missed PREP classes. Has taken job at night so requests to switch to a daytime class.  Advised next class won't be until Oct 21 MW 230p-345p.  Will call her when I am starting that class to confirm

## 2023-02-02 ENCOUNTER — Other Ambulatory Visit: Payer: Self-pay

## 2023-02-04 ENCOUNTER — Other Ambulatory Visit: Payer: Self-pay

## 2023-02-06 ENCOUNTER — Other Ambulatory Visit: Payer: Self-pay

## 2023-02-07 ENCOUNTER — Other Ambulatory Visit: Payer: Self-pay

## 2023-02-11 ENCOUNTER — Other Ambulatory Visit: Payer: Self-pay

## 2023-02-12 ENCOUNTER — Other Ambulatory Visit (HOSPITAL_COMMUNITY): Payer: Self-pay

## 2023-02-20 ENCOUNTER — Other Ambulatory Visit: Payer: Self-pay

## 2023-02-28 ENCOUNTER — Other Ambulatory Visit: Payer: Self-pay

## 2023-03-01 ENCOUNTER — Ambulatory Visit: Payer: 59 | Attending: Internal Medicine | Admitting: Internal Medicine

## 2023-03-01 ENCOUNTER — Encounter: Payer: Self-pay | Admitting: Internal Medicine

## 2023-03-01 VITALS — BP 134/84 | HR 82 | Ht 62.0 in | Wt 214.0 lb

## 2023-03-01 DIAGNOSIS — E1169 Type 2 diabetes mellitus with other specified complication: Secondary | ICD-10-CM

## 2023-03-01 DIAGNOSIS — E785 Hyperlipidemia, unspecified: Secondary | ICD-10-CM

## 2023-03-01 DIAGNOSIS — E1159 Type 2 diabetes mellitus with other circulatory complications: Secondary | ICD-10-CM

## 2023-03-01 DIAGNOSIS — E119 Type 2 diabetes mellitus without complications: Secondary | ICD-10-CM | POA: Diagnosis not present

## 2023-03-01 DIAGNOSIS — I2510 Atherosclerotic heart disease of native coronary artery without angina pectoris: Secondary | ICD-10-CM

## 2023-03-01 DIAGNOSIS — I10 Essential (primary) hypertension: Secondary | ICD-10-CM | POA: Diagnosis not present

## 2023-03-01 NOTE — Patient Instructions (Signed)
Medication Instructions:  NO CHANGES  *If you need a refill on your cardiac medications before your next appointment, please call your pharmacy*   Lab Work: FASTING lipid panel and A1c in 4 months -- Feb 2025 If you have labs (blood work) drawn today and your tests are completely normal, you will receive your results only by: MyChart Message (if you have MyChart) OR A paper copy in the mail If you have any lab test that is abnormal or we need to change your treatment, we will call you to review the results.   Testing/Procedures: NONE   Follow-Up: At [facility], you and your health needs are our priority.  As part of our continuing mission to provide you with exceptional heart care, we have created designated Provider Care Teams.  These Care Teams include your primary Cardiologist (physician) and Advanced Practice Providers (APPs -  Physician Assistants and Nurse Practitioners) who all work together to provide you with the care you need, when you need it.  We recommend signing up for the patient portal called "MyChart".  Sign up information is provided on this After Visit Summary.  MyChart is used to connect with patients for Virtual Visits (Telemedicine).  Patients are able to view lab/test results, encounter notes, upcoming appointments, etc.  Non-urgent messages can be sent to your provider as well.   To learn more about what you can do with MyChart, go to [email or web address].    Your next appointment:   6 months with Dr. Rennis Golden or NP/PA ** call in December for appointment

## 2023-03-01 NOTE — Progress Notes (Signed)
OFFICE NOTE  Chief Complaint:  Follow-up  Primary Care Physician: Storm Frisk, MD  HPI:  Amy [name] is a 47 y.o. female with a past medial history significant for chest pain and recent NSTEMI in June 2021 with abnormal EKG demonstrating deep T wave inversions.  She underwent cardiac catheterization which showed only a 50% intermediate vessel stenosis however there was catheter induced spasm.  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  She also has cardiac risk factors including diabetes, hypertension and dyslipidemia.  She has been on high potency statin therapy however lipids in July were white low with a total cholesterol 94, HDL 32, LDL 44 and triglycerides 91.  She is also been experiencing persistent right wrist and arm pain.  This has been significant and she did underwent catheterization in that arm.  She did have a ultrasound which showed no evidence of arteriovenous issues.  She has a follow-up with her PCP in November however it seems like this needs to be further evaluated either with imaging or possible referral to neurology or orthopedics.  04/05/2022  Amy [name] returns today for follow-up.  Overall she says she is feeling very well.  She denies any further chest pain issues.  Her blood pressure is now finally controlled at 126/72.  I had backed off on her statin to see if it improves some of her arm pain however she was subsequently found to have some orthopedic issue.  This has improved.  Her lipids were very low about 2 years ago with LDL in the 40s which prompted me to back off on her a atorvastatin from 80 to 40 mg.  Subsequently the last year her cholesterol had gone with LDL 112.  Is now back to 102 and she continues to work on diet and weight loss.  03/01/2023  Amy [name] is seen today in follow-up.  Overall she says she is doing quite well.  Her cholesterol has somewhat improved with total 174, triglycerides 60, HDL 53 and LDL 109.  She  does remain above target LDL less than 70.  A1c was 7.1% in February and she is continue to work on getting that down.  She had lost more weight down to about 208 pounds but on the scale today is about 6 pounds more.  She is going to continue to work on that.  We talked about other possible options to lower her cholesterol further.  I think however she is working aggressively with diet and weight loss and I would recommend no changes at this time.  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   Coronary artery disease    Diabetes mellitus without complication (HCC)    Excessive or frequent menstruation 09/10/2012   Hyperlipidemia    Hypertension    NSTEMI (non-ST elevated myocardial infarction) (HCC) 10/30/2019   PONV (postoperative nausea and vomiting)    with ablation only    Past Surgical History:  Procedure Laterality Date   CHOLECYSTECTOMY  2006   CORONARY ANGIOPLASTY     DILITATION & CURRETTAGE/HYSTROSCOPY WITH HYDROTHERMAL ABLATION N/A 07/10/2013   Procedure: DILATATION & CURETTAGE/HYSTEROSCOPY WITH HYDROTHERMAL ABLATION;  Surgeon: Brock Bad, MD;  Location: [facility];  Service: Gynecology;  Laterality: N/A;   LEFT HEART CATH AND CORONARY ANGIOGRAPHY N/A 11/02/2019   Procedure: LEFT HEART CATH AND CORONARY ANGIOGRAPHY;  Surgeon: Tonny Bollman, MD;  [facility] INVASIVE CV LAB;  Service: Cardiovascular;  Laterality: N/A;   TUBAL LIGATION  1997   WISDOM TOOTH EXTRACTION  1998    FAMHx:  Family History  Problem Relation Age of Onset   Breast cancer Mother    Hypertension Mother    Hypertension Father    Heart failure Maternal Aunt    Leukemia Paternal Uncle    COPD Other    Hypertension Other    Diabetes Other    Cancer Other    Colon cancer Neg Hx    Colon polyps Neg Hx    Esophageal cancer Neg Hx    Rectal cancer Neg Hx    Stomach cancer Neg Hx     SOCHx:   reports that she has never smoked. She has  never used smokeless tobacco. She reports that she does not drink alcohol and does not use drugs.  ALLERGIES:  Allergies  Allergen Reactions   Amlodipine Hives   Hydrocodone-Acetaminophen Hives and Itching    And itching Itching and hives   Losartan Potassium-Hctz Other (See Comments)    Dizziness   Tetanus Antitoxin     Arm swelling size of a softball.    Tetanus Toxoids     Arm swelling size of a softball.     ROS: Pertinent items noted in HPI and remainder of comprehensive ROS otherwise negative.  HOME MEDS: Current Outpatient Medications on File Prior to Visit  Medication Sig Dispense Refill   aspirin 81 MG EC tablet Take 1 tablet (81 mg total) by mouth daily. 90 tablet 3   atorvastatin (LIPITOR) 40 MG tablet Take 1 tablet (40 mg total) by mouth daily. 90 tablet 3   carvedilol (COREG) 25 MG tablet Take 1 tablet (25 mg total) by mouth 2 (two) times daily with a meal. 120 tablet 2   empagliflozin (JARDIANCE) 10 MG TABS tablet Take 1 tablet (10 mg total) by mouth daily. 30 tablet 3   ezetimibe (ZETIA) 10 MG tablet Take 1 tablet (10 mg total) by mouth daily. 90 tablet 3   fexofenadine (ALLEGRA) 180 MG tablet Take 180 mg by mouth daily.     fluticasone (FLONASE) 50 MCG/ACT nasal spray Place 2 sprays into both nostrils daily. 16 g 6   Multiple Vitamin (MULTIVITAMIN WITH MINERALS) TABS tablet Take 1 tablet by mouth daily.     nitroGLYCERIN (NITROSTAT) 0.4 MG SL tablet Place 1 tablet (0.4 mg total) under the tongue every 5 (five) minutes x 3 doses as needed for chest pain. 25 tablet 11   nortriptyline (PAMELOR) 25 MG capsule Take 1 capsule (25 mg total) by mouth at bedtime to prevent pain the next day takes as needed. 30 capsule 2   spironolactone (ALDACTONE) 50 MG tablet Take 1 tablet (50 mg total) by mouth daily. 90 tablet 3   valsartan-hydrochlorothiazide (DIOVAN-HCT) 320-25 MG tablet Take 1 tablet by mouth daily. 90 tablet 3   No current facility-administered medications on file  prior to visit.    LABS/IMAGING: No results found for this or any previous visit (from the past 48 hour(s)). No results found.  LIPID PANEL:    Component Value Date/Time   CHOL 174 07/11/2022 0931   TRIG 60 07/11/2022 0931   HDL 53 07/11/2022 0931   CHOLHDL 3.3 07/11/2022 0931   CHOLHDL 4.1 11/01/2019 0726   VLDL 23 11/01/2019 0726   LDLCALC 109 (H) 07/11/2022 0931     WEIGHTS: Wt Readings from Last 3 Encounters:  03/01/23 214 lb (97.1 kg)  01/01/23 208 lb 12.8 oz (94.7 kg)  12/31/22 207 lb (93.9 kg)    VITALS: BP 134/84   Pulse 82   Ht 5\' 2"  (1.575 m)   Wt 214 lb (97.1 kg)   SpO2 96%   BMI 39.14 kg/m   EXAM: Deferred  EKG: EKG Interpretation Date/Time:  Friday March 01 2023 09:10:43 EDT Ventricular Rate:  82 PR Interval:  150 QRS Duration:  82 QT Interval:  402 QTC Calculation: 469 R Axis:   23  Text Interpretation: Normal sinus rhythm Minimal voltage criteria for LVH, may be normal variant ( R in aVL ) When compared with ECG of 06-May-2022 08:53, No significant change was found Confirmed by Zoila Shutter [phone]) on 03/01/2023 9:45:50 AM    ASSESSMENT: NSTEMI-thought to be related to vasospasm (10/2019) Type 2 diabetes Dyslipidemia Hypertension Morbid obesity  PLAN: 1.   Amy [name] is doing well without any complaints today.  EKG is normal.  Her cholesterol is still above target.  Weight is gone up slightly but she says she feels well.  She wants to continue to work on that.  Will plan repeat lipids in about 3 to 4 months if necessary might consider additional therapy for lipid lowering.  Chrystie Nose, MD, [facility], FACP  [place]  [facility]  Medical Director of the Advanced Lipid Disorders &  Cardiovascular Risk Reduction Clinic Diplomate of the American Board of Clinical Lipidology Attending Cardiologist  Direct Dial: [phone]  Fax: [phone]  Website:  [email or web address] Nicely Ricke Kimoto 03/01/2023, 9:46 AM

## 2023-03-07 ENCOUNTER — Other Ambulatory Visit (HOSPITAL_COMMUNITY): Payer: Self-pay

## 2023-03-12 ENCOUNTER — Telehealth: Payer: Self-pay

## 2023-03-12 NOTE — Telephone Encounter (Signed)
VMT pt requesting call back to see if she wants to do PREP starting on 03/18/23

## 2023-03-19 ENCOUNTER — Telehealth: Payer: Self-pay

## 2023-03-19 NOTE — Telephone Encounter (Signed)
VMT pt requesting call back.  Had her scheduled to restart PREP on 03/18/23 MW 1p-215p however did not show.  Need to confirm, she wants to participate.  Given call back number [phone]

## 2023-03-20 NOTE — Progress Notes (Signed)
[employer] PREP Weekly Session  Patient Details  Name: Amy [name] MRN: [id number] Date of Birth: [birth year] Age: 47 y.o. PCP: Storm Frisk, MD  There were no vitals filed for this visit.   [employer] Weekly seesion - 03/20/23 1500       [employer] "PREP" Location   [facility] "PREP" Location [facility]      Weekly Session   Topic Discussed Goal setting and welcome to the program   fit testing   Classes attended to date 1            Has restarted PREP. Will update VS next week.  Pam Jerral Bonito 03/20/2023, 3:21 PM

## 2023-03-22 ENCOUNTER — Emergency Department (HOSPITAL_COMMUNITY)
Admission: EM | Admit: 2023-03-22 | Discharge: 2023-03-23 | Disposition: A | Discharge status: Home / Self Care | Payer: 59 | Attending: Emergency Medicine | Admitting: Emergency Medicine

## 2023-03-22 ENCOUNTER — Encounter (HOSPITAL_COMMUNITY): Payer: Self-pay | Admitting: Emergency Medicine

## 2023-03-22 ENCOUNTER — Emergency Department (HOSPITAL_COMMUNITY): Payer: 59

## 2023-03-22 DIAGNOSIS — R0602 Shortness of breath: Secondary | ICD-10-CM | POA: Diagnosis not present

## 2023-03-22 DIAGNOSIS — R0789 Other chest pain: Secondary | ICD-10-CM | POA: Insufficient documentation

## 2023-03-22 DIAGNOSIS — R079 Chest pain, unspecified: Secondary | ICD-10-CM | POA: Diagnosis not present

## 2023-03-22 LAB — BASIC METABOLIC PANEL
Anion gap: 11 (ref 5–15)
BUN: 10 mg/dL (ref 6–20)
CO2: 24 mmol/L (ref 22–32)
Calcium: 8.7 mg/dL — ABNORMAL LOW (ref 8.9–10.3)
Chloride: 102 mmol/L (ref 98–111)
Creatinine, Ser: 0.83 mg/dL (ref 0.44–1.00)
GFR, Estimated: >60 mL/min (ref >60)
Glucose, Bld: 132 mg/dL — ABNORMAL HIGH (ref 70–99)
Potassium: 3.6 mmol/L (ref 3.5–5.1)
Sodium: 137 mmol/L (ref 135–145)

## 2023-03-22 LAB — TROPONIN I (HIGH SENSITIVITY)
Troponin I (High Sensitivity): 23 ng/L — ABNORMAL HIGH (ref <18)
Troponin I (High Sensitivity): 28 ng/L — ABNORMAL HIGH (ref <18)

## 2023-03-22 LAB — CBC
HCT: 35.4 % — ABNORMAL LOW (ref 36.0–46.0)
Hemoglobin: 11.6 g/dL — ABNORMAL LOW (ref 12.0–15.0)
MCH: 28.2 pg (ref 26.0–34.0)
MCHC: 32.8 g/dL (ref 30.0–36.0)
MCV: 86.1 fL (ref 80.0–100.0)
Platelets: 303 10*3/uL (ref 150–400)
RBC: 4.11 MIL/uL (ref 3.87–5.11)
RDW: 12.7 % (ref 11.5–15.5)
WBC: 7.2 10*3/uL (ref 4.0–10.5)
nRBC: 0 % (ref 0.0–0.2)

## 2023-03-22 LAB — HCG, SERUM, QUALITATIVE: Preg, Serum: NEGATIVE

## 2023-03-22 NOTE — ED Triage Notes (Signed)
Pt here from home with c/o sob and chest pressure , cp radiates to her right shoulder, no n/v

## 2023-03-23 ENCOUNTER — Other Ambulatory Visit: Payer: Self-pay

## 2023-03-23 LAB — TROPONIN I (HIGH SENSITIVITY): Troponin I (High Sensitivity): 22 ng/L — ABNORMAL HIGH (ref <18)

## 2023-03-23 MED ORDER — CYCLOBENZAPRINE HCL 10 MG PO TABS: 10.0000 mg | ORAL_TABLET | Freq: Three times a day (TID) | ORAL | 0 refills | Status: AC | PRN

## 2023-03-23 MED ORDER — ACETAMINOPHEN 500 MG PO TABS
1000.0000 mg | ORAL_TABLET | Freq: Once | ORAL | Status: AC
  Administered 2023-03-23: 1000 mg via ORAL
  Filled 2023-03-23: qty 2

## 2023-03-23 MED ORDER — IBUPROFEN 600 MG PO TABS: 600.0000 mg | ORAL_TABLET | Freq: Four times a day (QID) | ORAL | 0 refills | Status: AC | PRN

## 2023-03-23 MED ORDER — MORPHINE SULFATE (PF) 4 MG/ML IV SOLN
4.0000 mg | Freq: Once | INTRAVENOUS | Status: AC
  Administered 2023-03-23: 4 mg via INTRAVENOUS
  Filled 2023-03-23: qty 1

## 2023-03-23 MED ORDER — ONDANSETRON HCL 4 MG/2ML IJ SOLN
4.0000 mg | Freq: Once | INTRAMUSCULAR | Status: AC
Start: 2023-03-23 — End: 2023-03-23
  Administered 2023-03-23: 4 mg via INTRAVENOUS
  Filled 2023-03-23: qty 2

## 2023-03-23 MED ORDER — NITROGLYCERIN 0.4 MG SL SUBL
0.4000 mg | SUBLINGUAL_TABLET | SUBLINGUAL | Status: DC | PRN
  Administered 2023-03-23 (×2): 0.4 mg via SUBLINGUAL
  Filled 2023-03-23: qty 1

## 2023-03-23 NOTE — ED Provider Notes (Signed)
[facility]PT [facility] Emergency Department Provider Note MRN:  [id number]  Arrival date & time: 03/23/23     Chief Complaint   Shortness of Breath and Chest Pain   History of Present Illness   Amy [name] is a 47 y.o. year-old female presents to the ED with chief complaint of chest pain.  Reports chest tenderness to palpation.  Onset of symptoms was today around breakfast.  She denies any injury.  States that pain has been constant all day long.  She denies SOB, but states that it hurts to breath.  She states that she has been having cough, fever, and cold symptoms.  Has tried Tylenol without relief.  States that the pain radiates into her back.  History provided by patient.   Review of Systems  Pertinent positive and negative review of systems noted in HPI.    Physical Exam   Vitals:   03/23/23 0300 03/23/23 0315  BP: (!) 197/91 (!) 170/85  Pulse: 76 78  Resp: 18 16  Temp:    SpO2: 100% 99%    CONSTITUTIONAL:  non toxic-appearing, NAD NEURO:  Alert and oriented x 3, CN 3-12 grossly intact EYES:  eyes equal and reactive ENT/NECK:  Supple, no stridor  CARDIO:  normal rate, regular rhythm, appears well-perfused, anterior chest wall tenderness PULM:  No respiratory distress, CTAB GI/GU:  non-distended,  MSK/SPINE:  No gross deformities, no edema, moves all extremities  SKIN:  no rash, atraumatic   *Additional and/or pertinent findings included in MDM below  Diagnostic and Interventional Summary    EKG Interpretation Date/Time:    Ventricular Rate:    PR Interval:    QRS Duration:    QT Interval:    QTC Calculation:   R Axis:      Text Interpretation:         Labs Reviewed  BASIC METABOLIC PANEL - Abnormal; Notable for the following components:      Result Value   Glucose, Bld 132 (*)    Calcium 8.7 (*)    All other components within normal limits  CBC - Abnormal; Notable for the following components:   Hemoglobin 11.6 (*)    HCT  35.4 (*)    All other components within normal limits  TROPONIN I (HIGH SENSITIVITY) - Abnormal; Notable for the following components:   Troponin I (High Sensitivity) 23 (*)    All other components within normal limits  TROPONIN I (HIGH SENSITIVITY) - Abnormal; Notable for the following components:   Troponin I (High Sensitivity) 28 (*)    All other components within normal limits  TROPONIN I (HIGH SENSITIVITY) - Abnormal; Notable for the following components:   Troponin I (High Sensitivity) 22 (*)    All other components within normal limits  HCG, SERUM, QUALITATIVE  TROPONIN I (HIGH SENSITIVITY)    DG Chest 2 View  Final Result      Medications  nitroGLYCERIN (NITROSTAT) SL tablet 0.4 mg (0.4 mg Sublingual Given 03/23/23 0320)  morphine (PF) 4 MG/ML injection 4 mg (4 mg Intravenous Given 03/23/23 0302)  ondansetron (ZOFRAN) injection 4 mg (4 mg Intravenous Given 03/23/23 0322)  acetaminophen (TYLENOL) tablet 1,000 mg (1,000 mg Oral Given 03/23/23 0357)     Procedures  /  Critical Care Procedures  ED Course and Medical Decision Making  I have reviewed the triage vital signs, the nursing notes, and pertinent available records from the EMR.  Social Determinants Affecting Complexity of Care: Patient has no clinically significant social determinants  affecting this chief complaint..   ED Course: Clinical Course as of 03/23/23 0402  Sat Mar 23, 2023  4098 Troponin I (High Sensitivity)(!) Trop 23->28->22, symptoms are very easily reproducible and seem to be MSK.  Trops are flat.  I doubt ACS. [RB]    Clinical Course User Index [RB] Roxy Horseman, PA-C    Medical Decision Making Patient here with chest pain that started this morning around breakfast.  She says that her chest is very tender to the touch and is worsened with movement and deep breathing.  States that she has had this type of chest wall pain in the past.    Will check labs and imaging and reassess.  Patient  reassessed after treatment and states that she is pain free.  Due to the reproducible nature of her pain, flat trops (minimally elevated), and EKG without new ischemic changes, I feel that patient can be discharged.  She is advised to follow-up with her doctor.    Patient agreeable with plan.  Amount and/or Complexity of Data Reviewed Labs: ordered. Decision-making details documented in ED Course. Radiology: ordered and independent interpretation performed.    Details: No effusion or opacity seen ECG/medicine tests: ordered.    Details: EKG reviewed with Dr. Bebe Shaggy, states similar to prior EKGs.  Risk OTC drugs. Prescription drug management.         Consultants: No consultations were needed in caring for this patient.   Treatment and Plan: I considered admission due to patient's initial presentation, but after considering the examination and diagnostic results, patient will not require admission and can be discharged with outpatient follow-up.  Patient discussed with attending physician, Dr. Bebe Shaggy.  Final Clinical Impressions(s) / ED Diagnoses     ICD-10-CM   1. Chest wall pain  R07.89       ED Discharge Orders          Ordered    ibuprofen (ADVIL) 600 MG tablet  Every 6 hours PRN        03/23/23 0356    cyclobenzaprine (FLEXERIL) 10 MG tablet  3 times daily PRN        03/23/23 0356              Discharge Instructions Discussed with and Provided to Patient:   Discharge Instructions   None      Roxy Horseman, PA-C 03/23/23 0402    Zadie Rhine, MD 03/23/23 [phone]

## 2023-03-23 NOTE — ED Notes (Addendum)
Patient instructed not to operate motor vehicle because of IV dose of morphine at 0302.

## 2023-04-04 ENCOUNTER — Other Ambulatory Visit: Payer: Self-pay | Admitting: Critical Care Medicine

## 2023-04-04 ENCOUNTER — Other Ambulatory Visit: Payer: Self-pay

## 2023-04-04 DIAGNOSIS — I10 Essential (primary) hypertension: Secondary | ICD-10-CM

## 2023-05-08 ENCOUNTER — Other Ambulatory Visit: Payer: Self-pay

## 2023-05-13 NOTE — Progress Notes (Signed)
No response from text sent last week about continuing PREP.  Has attended 3 classes of 15 offered so far.

## 2023-06-27 ENCOUNTER — Ambulatory Visit: Payer: 59 | Admitting: Physician Assistant

## 2023-06-27 DIAGNOSIS — Z7984 Long term (current) use of oral hypoglycemic drugs: Secondary | ICD-10-CM

## 2023-06-27 DIAGNOSIS — E1165 Type 2 diabetes mellitus with hyperglycemia: Secondary | ICD-10-CM

## 2023-07-12 ENCOUNTER — Other Ambulatory Visit: Payer: Self-pay | Admitting: Critical Care Medicine

## 2023-07-12 DIAGNOSIS — I10 Essential (primary) hypertension: Secondary | ICD-10-CM

## 2023-07-15 ENCOUNTER — Other Ambulatory Visit: Payer: Self-pay | Admitting: Critical Care Medicine

## 2023-07-17 ENCOUNTER — Telehealth: Payer: Self-pay | Admitting: Pharmacist

## 2023-07-17 ENCOUNTER — Encounter: Payer: Self-pay | Admitting: Pharmacist

## 2023-07-17 ENCOUNTER — Other Ambulatory Visit: Payer: Self-pay

## 2023-07-17 ENCOUNTER — Encounter: Payer: Self-pay | Admitting: Cardiovascular Disease

## 2023-07-17 LAB — LIPID PANEL
Chol/HDL Ratio: 3.4 {ratio} (ref 0.0–4.4)
Cholesterol, Total: 171 mg/dL (ref 100–199)
HDL: 51 mg/dL (ref >39)
LDL Chol Calc (NIH): 106 mg/dL — ABNORMAL HIGH (ref 0–99)
Triglycerides: 71 mg/dL (ref 0–149)
VLDL Cholesterol Cal: 14 mg/dL (ref 5–40)

## 2023-07-17 LAB — HEMOGLOBIN A1C
Est. average glucose Bld gHb Est-mCnc: 166 mg/dL
Hgb A1c MFr Bld: 7.4 % — ABNORMAL HIGH (ref 4.8–5.6)

## 2023-07-17 NOTE — Telephone Encounter (Signed)
Hey friends,   Can someone from the front call this patient and schedule her with me for DM? Thank you!

## 2023-07-22 ENCOUNTER — Other Ambulatory Visit: Payer: Self-pay | Admitting: *Deleted

## 2023-07-22 ENCOUNTER — Encounter: Payer: Self-pay | Admitting: Internal Medicine

## 2023-07-22 ENCOUNTER — Other Ambulatory Visit: Payer: Self-pay

## 2023-07-22 DIAGNOSIS — I10 Essential (primary) hypertension: Secondary | ICD-10-CM

## 2023-07-22 MED ORDER — ATORVASTATIN CALCIUM 80 MG PO TABS
80.0000 mg | ORAL_TABLET | Freq: Every day | ORAL | 1 refills | Status: AC
  Filled 2023-07-22: qty 30, 30d supply, fill #0

## 2023-07-31 ENCOUNTER — Other Ambulatory Visit: Payer: Self-pay

## 2024-01-31 ENCOUNTER — Other Ambulatory Visit (HOSPITAL_BASED_OUTPATIENT_CLINIC_OR_DEPARTMENT_OTHER): Payer: Self-pay

## 2024-02-13 ENCOUNTER — Encounter (HOSPITAL_BASED_OUTPATIENT_CLINIC_OR_DEPARTMENT_OTHER): Payer: Self-pay | Admitting: Emergency Medicine

## 2024-02-13 ENCOUNTER — Other Ambulatory Visit: Payer: Self-pay

## 2024-02-13 ENCOUNTER — Emergency Department (HOSPITAL_BASED_OUTPATIENT_CLINIC_OR_DEPARTMENT_OTHER): Payer: Self-pay

## 2024-02-13 ENCOUNTER — Other Ambulatory Visit (HOSPITAL_BASED_OUTPATIENT_CLINIC_OR_DEPARTMENT_OTHER): Payer: Self-pay

## 2024-02-13 ENCOUNTER — Emergency Department (HOSPITAL_BASED_OUTPATIENT_CLINIC_OR_DEPARTMENT_OTHER)
Admission: EM | Admit: 2024-02-13 | Discharge: 2024-02-13 | Disposition: A | Discharge status: Home / Self Care | Payer: Self-pay | Attending: Emergency Medicine | Admitting: Emergency Medicine

## 2024-02-13 DIAGNOSIS — J40 Bronchitis, not specified as acute or chronic: Secondary | ICD-10-CM | POA: Insufficient documentation

## 2024-02-13 DIAGNOSIS — R051 Acute cough: Secondary | ICD-10-CM

## 2024-02-13 DIAGNOSIS — Z79899 Other long term (current) drug therapy: Secondary | ICD-10-CM | POA: Insufficient documentation

## 2024-02-13 DIAGNOSIS — I2510 Atherosclerotic heart disease of native coronary artery without angina pectoris: Secondary | ICD-10-CM | POA: Insufficient documentation

## 2024-02-13 DIAGNOSIS — E119 Type 2 diabetes mellitus without complications: Secondary | ICD-10-CM | POA: Insufficient documentation

## 2024-02-13 DIAGNOSIS — Z7984 Long term (current) use of oral hypoglycemic drugs: Secondary | ICD-10-CM | POA: Insufficient documentation

## 2024-02-13 DIAGNOSIS — Z7982 Long term (current) use of aspirin: Secondary | ICD-10-CM | POA: Insufficient documentation

## 2024-02-13 LAB — RESP PANEL BY RT-PCR (RSV, FLU A&B, COVID)  RVPGX2
Influenza A by PCR: NEGATIVE
Influenza B by PCR: NEGATIVE
Resp Syncytial Virus by PCR: NEGATIVE
SARS Coronavirus 2 by RT PCR: NEGATIVE

## 2024-02-13 MED ORDER — BENZONATATE 100 MG PO CAPS
200.0000 mg | ORAL_CAPSULE | Freq: Once | ORAL | Status: AC
  Administered 2024-02-13: 200 mg via ORAL
  Filled 2024-02-13: qty 2

## 2024-02-13 MED ORDER — CARVEDILOL 12.5 MG PO TABS
25.0000 mg | ORAL_TABLET | Freq: Once | ORAL | Status: AC
  Administered 2024-02-13: 25 mg via ORAL
  Filled 2024-02-13: qty 2

## 2024-02-13 MED ORDER — HYDROCHLOROTHIAZIDE 25 MG PO TABS
25.0000 mg | ORAL_TABLET | Freq: Once | ORAL | Status: AC
  Administered 2024-02-13: 25 mg via ORAL
  Filled 2024-02-13: qty 1

## 2024-02-13 MED ORDER — PREDNISONE 50 MG PO TABS
60.0000 mg | ORAL_TABLET | Freq: Once | ORAL | Status: AC
  Administered 2024-02-13: 60 mg via ORAL
  Filled 2024-02-13: qty 1

## 2024-02-13 MED ORDER — ALBUTEROL SULFATE HFA 108 (90 BASE) MCG/ACT IN AERS
2.0000 | INHALATION_SPRAY | Freq: Once | RESPIRATORY_TRACT | Status: DC
  Filled 2024-02-13: qty 6.7

## 2024-02-13 MED ORDER — PREDNISONE 20 MG PO TABS
60.0000 mg | ORAL_TABLET | Freq: Every day | ORAL | 0 refills | Status: DC
  Filled 2024-02-13: qty 15, 5d supply, fill #0

## 2024-02-13 MED ORDER — BENZONATATE 200 MG PO CAPS
200.0000 mg | ORAL_CAPSULE | Freq: Three times a day (TID) | ORAL | 0 refills | Status: DC | PRN
  Filled 2024-02-13: qty 21, 7d supply, fill #0

## 2024-02-13 MED ORDER — PREDNISONE 20 MG PO TABS
60.0000 mg | ORAL_TABLET | Freq: Every day | ORAL | 0 refills | Status: AC
  Filled 2024-02-13: qty 15, 5d supply, fill #0

## 2024-02-13 MED ORDER — BENZONATATE 200 MG PO CAPS
200.0000 mg | ORAL_CAPSULE | Freq: Three times a day (TID) | ORAL | 0 refills | Status: AC | PRN
  Filled 2024-02-13: qty 21, 7d supply, fill #0

## 2024-02-13 MED ORDER — ALBUTEROL SULFATE (2.5 MG/3ML) 0.083% IN NEBU
5.0000 mg | INHALATION_SOLUTION | Freq: Once | RESPIRATORY_TRACT | Status: AC
  Administered 2024-02-13: 5 mg via RESPIRATORY_TRACT
  Filled 2024-02-13: qty 6

## 2024-02-13 MED ORDER — SPIRONOLACTONE 12.5 MG HALF TABLET
25.0000 mg | ORAL_TABLET | Freq: Once | ORAL | Status: AC
  Administered 2024-02-13: 25 mg via ORAL
  Filled 2024-02-13: qty 2

## 2024-02-13 NOTE — ED Triage Notes (Signed)
 Productive Cough x 3 days , wheezing . Nasal congestion .

## 2024-02-13 NOTE — Discharge Instructions (Addendum)
 Your COVID and flu test were negative today and chest x-ray does not show any signs of pneumonia.  Suspect you have bronchitis caused by a virus leading to your cough and other symptoms.  Treat symptoms supportively with albuterol  inhaler as needed for cough or shortness of breath.  Tessalon  Perles 3 times daily for cough.  Take steroids for the next 5 days starting tomorrow morning.  It can take 4 to 6 weeks for cough to completely resolve.  Use cough drops or hard candies to help keep your throat moist to help decrease coughing episodes.  Follow-up closely with your primary care doctor if symptoms or not resolving.

## 2024-02-13 NOTE — ED Provider Notes (Signed)
  EMERGENCY DEPARTMENT AT [facility] Provider Note   CSN: [id number] Arrival date & time: 02/13/24  9075     Patient presents with: Cough   Amy [name] is a 48 y.o. female.   Amy [name] is a 48 y.o. female with acute show, CAD, diabetes, hyperlipidemia, who presents to the emergency department for evaluation of cough.  Patient reports 3 days of persistent cough.  She reports feelings of associated wheezing and chest tightness.  Does have a history of previous bronchitis infections and reports this feels similar.  She reports some soreness to cough but otherwise no chest pain or shortness of breath.  No lower extremity swelling.  No known fevers or chills.  When symptoms initially started she had some nasal congestion.  Has also had a scratchy sore throat.  No nausea vomiting or diarrhea.  Noted to be hypertensive on arrival patient reports that she did not take any of her blood pressure medications today because the coughing has gotten so bad that she just wanted to come in and get evaluated.  The history is provided by the patient and medical records.  Cough Associated symptoms: rhinorrhea and wheezing   Associated symptoms: no chest pain, no chills, no fever, no headaches, no myalgias and no shortness of breath        Prior to Admission medications   Medication Sig Start Date End Date Taking? Authorizing Provider  aspirin  81 MG EC tablet Take 1 tablet (81 mg total) by mouth daily. 01/16/21   Brien Belvie BRAVO, MD  atorvastatin  (LIPITOR ) 80 MG tablet Take 1 tablet (80 mg total) by mouth daily. 07/22/23   Hilty, Vinie BROCKS, MD  carvedilol  (COREG ) 25 MG tablet Take 1 tablet (25 mg total) by mouth 2 (two) times daily with a meal. 07/11/22   Brien Belvie BRAVO, MD  cyclobenzaprine  (FLEXERIL ) 10 MG tablet Take 1 tablet (10 mg total) by mouth 3 (three) times daily as needed for muscle spasms. 03/23/23   Vicky Charleston, PA-C  empagliflozin  (JARDIANCE ) 10  MG TABS tablet Take 1 tablet (10 mg total) by mouth daily. 07/11/22   Brien Belvie BRAVO, MD  ezetimibe  (ZETIA ) 10 MG tablet Take 1 tablet (10 mg total) by mouth daily. 07/12/22   Brien Belvie BRAVO, MD  fexofenadine (ALLEGRA) 180 MG tablet Take 180 mg by mouth daily.    [provider]  fluticasone  (FLONASE ) 50 MCG/ACT nasal spray Place 2 sprays into both nostrils daily. 03/08/22   Brien Belvie BRAVO, MD  ibuprofen  (ADVIL ) 600 MG tablet Take 1 tablet (600 mg total) by mouth every 6 (six) hours as needed. 03/23/23   Vicky Charleston, PA-C  Multiple Vitamin (MULTIVITAMIN WITH MINERALS) TABS tablet Take 1 tablet by mouth daily.    [provider]  nitroGLYCERIN  (NITROSTAT ) 0.4 MG SL tablet Place 1 tablet (0.4 mg total) under the tongue every 5 (five) minutes x 3 doses as needed for chest pain. 06/19/21   Brien Belvie BRAVO, MD  nortriptyline  (PAMELOR ) 25 MG capsule Take 1 capsule (25 mg total) by mouth at bedtime to prevent pain the next day takes as needed. 07/11/22   Brien Belvie BRAVO, MD  spironolactone  (ALDACTONE ) 50 MG tablet Take 1 tablet (50 mg total) by mouth daily. 12/19/22   Raford Riggs, MD  valsartan -hydrochlorothiazide  (DIOVAN -HCT) 320-25 MG tablet Take 1 tablet by mouth daily. 07/11/22   Brien Belvie BRAVO, MD    Allergies: Amlodipine , Hydrocodone -acetaminophen , Losartan  potassium-hctz, Tetanus antitoxin, and Tetanus toxoid-containing vaccines  Review of Systems  Constitutional:  Negative for chills and fever.  HENT:  Positive for congestion and rhinorrhea.   Respiratory:  Positive for cough, chest tightness and wheezing. Negative for shortness of breath.   Cardiovascular:  Negative for chest pain.  Musculoskeletal:  Negative for myalgias, neck pain and neck stiffness.  Neurological:  Negative for syncope and headaches.    Updated Vital Signs BP (!) 253/121 (BP Location: Right Arm)   Pulse 83   Temp 98.9 F (37.2 C)   Resp 18   Wt 94.3 kg   SpO2 96%   BMI  38.04 kg/m   Physical Exam Vitals and nursing note reviewed.  Constitutional:      General: She is not in acute distress.    Appearance: She is well-developed. She is not ill-appearing or diaphoretic.  HENT:     Head: Normocephalic and atraumatic.     Nose: Rhinorrhea present.     Mouth/Throat:     Mouth: Mucous membranes are moist.     Pharynx: Oropharynx is clear.  Eyes:     General:        Right eye: No discharge.        Left eye: No discharge.  Neck:     Comments: No rigidity Cardiovascular:     Rate and Rhythm: Normal rate and regular rhythm.     Heart sounds: Normal heart sounds. No murmur heard.    No friction rub. No gallop.  Pulmonary:     Effort: Pulmonary effort is normal. No respiratory distress.     Breath sounds: Wheezing present.     Comments: Respirations equal and unlabored, patient able to speak in full sentences, lungs with some faint scattered wheezing but seems to clear with cough.  Frequent dry cough during the encounter. Abdominal:     General: Bowel sounds are normal. There is no distension.     Palpations: Abdomen is soft. There is no mass.     Tenderness: There is no abdominal tenderness. There is no guarding.     Comments: Abdomen soft, nondistended, nontender to palpation in all quadrants without guarding or peritoneal signs  Musculoskeletal:        General: No deformity.     Cervical back: Neck supple.  Lymphadenopathy:     Cervical: No cervical adenopathy.  Skin:    General: Skin is warm and dry.     Capillary Refill: Capillary refill takes less than 2 seconds.  Neurological:     Mental Status: She is alert and oriented to person, place, and time.  Psychiatric:        Mood and Affect: Mood normal.        Behavior: Behavior normal.     (all labs ordered are listed, but only abnormal results are displayed) Labs Reviewed  RESP PANEL BY RT-PCR (RSV, FLU A&B, COVID)  [id number]    EKG: None  Radiology: DG Chest 2 View Result Date:  02/13/2024 CLINICAL DATA:  Cough for 3 days. EXAM: CHEST - 2 VIEW COMPARISON:  March 22, 2023. FINDINGS: Stable cardiomediastinal silhouette. Both lungs are clear. The visualized skeletal structures are unremarkable. IMPRESSION: No active cardiopulmonary disease. Electronically Signed   By: Lynwood Landy Raddle M.D.   On: 02/13/2024 10:20     Procedures   Medications Ordered in the ED  albuterol  (PROVENTIL ) (2.5 MG/3ML) 0.083% nebulizer solution 5 mg (5 mg Nebulization Given 02/13/24 1012)  predniSONE  (DELTASONE ) tablet 60 mg (60 mg Oral Given 02/13/24 1012)  benzonatate  (TESSALON )  capsule 200 mg (200 mg Oral Given 02/13/24 1012)  carvedilol  (COREG ) tablet 25 mg (25 mg Oral Given 02/13/24 1029)  hydrochlorothiazide  (HYDRODIURIL ) tablet 25 mg (25 mg Oral Given 02/13/24 1029)  spironolactone  (ALDACTONE ) tablet 25 mg (25 mg Oral Given 02/13/24 1029)                                    Medical Decision Making Amount and/or Complexity of Data Reviewed Radiology: ordered.  Risk Prescription drug management.   Patient with 3 days of persistent cough with some wheezing.  Patient is not a smoker.  On arrival significantly hypertensive but vitals otherwise stable, did not take her blood pressure medications and on review it appears that patient takes monthly antihypertensives.  No associated chest pain or shortness of breath and no lower extremity swelling.  Chest x-ray without evidence of pneumonia or pulmonary edema.  Respiratory viral panel is negative.  Suspect viral bronchitis.  Patient treated in the emergency department with nebulizer treatment, steroid and Tessalon  Perles for cough with significant improvement.  Also given her home blood pressure medications with significant improvement of hypertension.  No signs of hypertensive emergency.  Feel this is in the setting of missing morning medications and does not require further emergent workup.  Will treat with short course of steroids for viral  bronchitis, given the albuterol  inhaler to use as needed for spasms of cough that are unrelieved and have associated wheezing and chest tightness and prescribed Tessalon  Perles.  Discussed strict return precautions and close follow-up with PCP.  Discharged home in good condition.     Final diagnoses:  Acute cough  Bronchitis    ED Discharge Orders          Ordered    benzonatate  (TESSALON ) 200 MG capsule  3 times daily PRN,   Status:  Discontinued        02/13/24 1244    predniSONE  (DELTASONE ) 20 MG tablet  Daily,   Status:  Discontinued        02/13/24 1244    benzonatate  (TESSALON ) 200 MG capsule  3 times daily PRN        02/13/24 1252    predniSONE  (DELTASONE ) 20 MG tablet  Daily        02/13/24 1252               Alva Larraine FALCON, [place] 02/19/24 1658    Dreama Longs, MD 02/20/24 1306

## 2024-06-26 ENCOUNTER — Emergency Department (HOSPITAL_COMMUNITY): Payer: Self-pay

## 2024-06-26 ENCOUNTER — Emergency Department (HOSPITAL_COMMUNITY)
Admission: EM | Admit: 2024-06-26 | Discharge: 2024-06-26 | Disposition: A | Discharge status: Home / Self Care | Payer: Self-pay | Attending: Emergency Medicine | Admitting: Emergency Medicine

## 2024-06-26 DIAGNOSIS — E119 Type 2 diabetes mellitus without complications: Secondary | ICD-10-CM

## 2024-06-26 DIAGNOSIS — I2510 Atherosclerotic heart disease of native coronary artery without angina pectoris: Secondary | ICD-10-CM | POA: Insufficient documentation

## 2024-06-26 DIAGNOSIS — Z7982 Long term (current) use of aspirin: Secondary | ICD-10-CM | POA: Insufficient documentation

## 2024-06-26 DIAGNOSIS — R112 Nausea with vomiting, unspecified: Secondary | ICD-10-CM | POA: Insufficient documentation

## 2024-06-26 DIAGNOSIS — E1165 Type 2 diabetes mellitus with hyperglycemia: Secondary | ICD-10-CM | POA: Insufficient documentation

## 2024-06-26 DIAGNOSIS — Z79899 Other long term (current) drug therapy: Secondary | ICD-10-CM | POA: Insufficient documentation

## 2024-06-26 DIAGNOSIS — I10 Essential (primary) hypertension: Secondary | ICD-10-CM | POA: Insufficient documentation

## 2024-06-26 LAB — URINALYSIS, ROUTINE W REFLEX MICROSCOPIC
Bilirubin Urine: NEGATIVE
Glucose, UA: NEGATIVE mg/dL
Ketones, ur: NEGATIVE mg/dL
Leukocytes,Ua: NEGATIVE
Nitrite: NEGATIVE
Protein, ur: 100 mg/dL — AB
Specific Gravity, Urine: 1.017 (ref 1.005–1.030)
pH: 6 (ref 5.0–8.0)

## 2024-06-26 LAB — COMPREHENSIVE METABOLIC PANEL WITH GFR
ALT: 10 U/L (ref 0–44)
AST: 27 U/L (ref 15–41)
Albumin: 4.3 g/dL (ref 3.5–5.0)
Alkaline Phosphatase: 107 U/L (ref 38–126)
Anion gap: 11 (ref 5–15)
BUN: 11 mg/dL (ref 6–20)
CO2: 28 mmol/L (ref 22–32)
Calcium: 9.9 mg/dL (ref 8.9–10.3)
Chloride: 95 mmol/L — ABNORMAL LOW (ref 98–111)
Creatinine, Ser: 0.9 mg/dL (ref 0.44–1.00)
GFR, Estimated: >60 mL/min
Glucose, Bld: 204 mg/dL — ABNORMAL HIGH (ref 70–99)
Potassium: 3.8 mmol/L (ref 3.5–5.1)
Sodium: 134 mmol/L — ABNORMAL LOW (ref 135–145)
Total Bilirubin: 0.6 mg/dL (ref 0.0–1.2)
Total Protein: 9.5 g/dL — ABNORMAL HIGH (ref 6.5–8.1)

## 2024-06-26 LAB — CBC
HCT: 40.8 % (ref 36.0–46.0)
Hemoglobin: 13.6 g/dL (ref 12.0–15.0)
MCH: 27.6 pg (ref 26.0–34.0)
MCHC: 33.3 g/dL (ref 30.0–36.0)
MCV: 82.9 fL (ref 80.0–100.0)
Platelets: 392 10*3/uL (ref 150–400)
RBC: 4.92 MIL/uL (ref 3.87–5.11)
RDW: 12.6 % (ref 11.5–15.5)
WBC: 9.3 10*3/uL (ref 4.0–10.5)
nRBC: 0 % (ref 0.0–0.2)

## 2024-06-26 LAB — RESP PANEL BY RT-PCR (RSV, FLU A&B, COVID)  RVPGX2
Influenza A by PCR: NEGATIVE
Influenza B by PCR: NEGATIVE
Resp Syncytial Virus by PCR: NEGATIVE
SARS Coronavirus 2 by RT PCR: NEGATIVE

## 2024-06-26 LAB — HCG, SERUM, QUALITATIVE: Preg, Serum: NEGATIVE

## 2024-06-26 LAB — TROPONIN T, HIGH SENSITIVITY
Troponin T High Sensitivity: 34 ng/L — ABNORMAL HIGH (ref 0–19)
Troponin T High Sensitivity: 32 ng/L — ABNORMAL HIGH (ref 0–19)

## 2024-06-26 LAB — LIPASE, BLOOD: Lipase: <10 U/L — ABNORMAL LOW (ref 11–51)

## 2024-06-26 MED ORDER — ONDANSETRON 4 MG PO TBDP: 4.0000 mg | ORAL_TABLET | Freq: Three times a day (TID) | ORAL | 0 refills | Status: AC | PRN

## 2024-06-26 MED ORDER — EMPAGLIFLOZIN 10 MG PO TABS: 10.0000 mg | ORAL_TABLET | Freq: Every day | ORAL | 0 refills | Status: AC

## 2024-06-26 MED ORDER — ONDANSETRON HCL 4 MG/2ML IJ SOLN
4.0000 mg | Freq: Once | INTRAMUSCULAR | Status: AC
  Administered 2024-06-26: 4 mg via INTRAVENOUS
  Filled 2024-06-26: qty 2

## 2024-06-26 MED ORDER — SODIUM CHLORIDE 0.9 % IV BOLUS
1000.0000 mL | Freq: Once | INTRAVENOUS | Status: AC
  Administered 2024-06-26: 1000 mL via INTRAVENOUS

## 2024-06-26 MED ORDER — HYDRALAZINE HCL 20 MG/ML IJ SOLN
20.0000 mg | Freq: Once | INTRAMUSCULAR | Status: AC
  Administered 2024-06-26: 20 mg via INTRAVENOUS
  Filled 2024-06-26: qty 1

## 2024-06-26 MED ORDER — LABETALOL HCL 5 MG/ML IV SOLN
10.0000 mg | Freq: Once | INTRAVENOUS | Status: AC
  Administered 2024-06-26: 10 mg via INTRAVENOUS
  Filled 2024-06-26: qty 4

## 2024-06-26 MED ORDER — CARVEDILOL 25 MG PO TABS: 25.0000 mg | ORAL_TABLET | Freq: Two times a day (BID) | ORAL | 0 refills | Status: AC

## 2024-06-26 MED ORDER — SPIRONOLACTONE 50 MG PO TABS: 50.0000 mg | ORAL_TABLET | Freq: Every day | ORAL | 0 refills | Status: AC

## 2024-06-26 MED ORDER — VALSARTAN-HYDROCHLOROTHIAZIDE 320-25 MG PO TABS: 1.0000 | ORAL_TABLET | Freq: Every day | ORAL | 0 refills | Status: AC

## 2024-06-26 NOTE — ED Provider Notes (Signed)
 " [place] [facility]MENT AT [facility] Provider Note   CSN: [id number] Arrival date & time: 06/26/24  9157     Patient presents with: Emesis   Amy [name] is a 49 y.o. female.   Pt is a 49 yo female with pmhx significant for htn, hld, anemia, CAD, dm, and obesity.  Pt presents to the ED today with vomiting and not feeling well.  She has cramping all over.  She did not take her bp med today.  She denies cp or sob.  She no longer has periods as she is s/p ablation.       Prior to Admission medications  Medication Sig Start Date End Date Taking? Authorizing Provider  ondansetron  (ZOFRAN -ODT) 4 MG disintegrating tablet Take 1 tablet (4 mg total) by mouth every 8 (eight) hours as needed. 06/26/24  Yes Dean Clarity, MD  aspirin  81 MG EC tablet Take 1 tablet (81 mg total) by mouth daily. 01/16/21   Brien Belvie BRAVO, MD  atorvastatin  (LIPITOR ) 80 MG tablet Take 1 tablet (80 mg total) by mouth daily. 07/22/23   Hilty, Vinie BROCKS, MD  benzonatate  (TESSALON ) 200 MG capsule Take 1 capsule (200 mg total) by mouth 3 (three) times daily as needed for cough. 02/13/24   Kehrli, Kelsey F, PA-C  carvedilol  (COREG ) 25 MG tablet Take 1 tablet (25 mg total) by mouth 2 (two) times daily with a meal. 06/26/24   Dean Clarity, MD  cyclobenzaprine  (FLEXERIL ) 10 MG tablet Take 1 tablet (10 mg total) by mouth 3 (three) times daily as needed for muscle spasms. 03/23/23   Vicky Charleston, PA-C  empagliflozin  (JARDIANCE ) 10 MG TABS tablet Take 1 tablet (10 mg total) by mouth daily. 06/26/24   Dean Clarity, MD  ezetimibe  (ZETIA ) 10 MG tablet Take 1 tablet (10 mg total) by mouth daily. 07/12/22   Brien Belvie BRAVO, MD  fexofenadine (ALLEGRA) 180 MG tablet Take 180 mg by mouth daily.    [provider]  fluticasone  (FLONASE ) 50 MCG/ACT nasal spray Place 2 sprays into both nostrils daily. 03/08/22   Brien Belvie BRAVO, MD  ibuprofen  (ADVIL ) 600 MG tablet Take 1 tablet (600 mg  total) by mouth every 6 (six) hours as needed. 03/23/23   Vicky Charleston, PA-C  Multiple Vitamin (MULTIVITAMIN WITH MINERALS) TABS tablet Take 1 tablet by mouth daily.    [provider]  nitroGLYCERIN  (NITROSTAT ) 0.4 MG SL tablet Place 1 tablet (0.4 mg total) under the tongue every 5 (five) minutes x 3 doses as needed for chest pain. 06/19/21   Brien Belvie BRAVO, MD  nortriptyline  (PAMELOR ) 25 MG capsule Take 1 capsule (25 mg total) by mouth at bedtime to prevent pain the next day takes as needed. 07/11/22   Brien Belvie BRAVO, MD  spironolactone  (ALDACTONE ) 50 MG tablet Take 1 tablet (50 mg total) by mouth daily. 06/26/24   Dean Clarity, MD  valsartan -hydrochlorothiazide  (DIOVAN -HCT) 320-25 MG tablet Take 1 tablet by mouth daily. 06/26/24   Dean Clarity, MD    Allergies: Amlodipine , Hydrocodone -acetaminophen , Losartan  potassium-hctz, Tetanus antitoxin, and Tetanus toxoid-containing vaccines    Review of Systems  Gastrointestinal:  Positive for nausea and vomiting.  All other systems reviewed and are negative.   Updated Vital Signs BP (!) 158/81   Pulse 94   Temp 98.1 F (36.7 C)   Resp 19   SpO2 95%   Physical Exam Vitals and nursing note reviewed.  Constitutional:      Appearance: Normal appearance.  HENT:  Head: Normocephalic and atraumatic.     Right Ear: External ear normal.     Left Ear: External ear normal.     Nose: Nose normal.     Mouth/Throat:     Mouth: Mucous membranes are moist.     Pharynx: Oropharynx is clear.  Eyes:     Extraocular Movements: Extraocular movements intact.     Conjunctiva/sclera: Conjunctivae normal.     Pupils: Pupils are equal, round, and reactive to light.  Cardiovascular:     Rate and Rhythm: Normal rate and regular rhythm.     Pulses: Normal pulses.     Heart sounds: Normal heart sounds.  Pulmonary:     Effort: Pulmonary effort is normal.     Breath sounds: Normal breath sounds.  Abdominal:     General: Abdomen is  flat. Bowel sounds are normal.     Palpations: Abdomen is soft.  Musculoskeletal:     Cervical back: Normal range of motion and neck supple.  Skin:    General: Skin is warm.     Capillary Refill: Capillary refill takes less than 2 seconds.  Neurological:     General: No focal deficit present.     Mental Status: She is alert and oriented to person, place, and time.  Psychiatric:        Mood and Affect: Mood normal.        Behavior: Behavior normal.     (all labs ordered are listed, but only abnormal results are displayed) Labs Reviewed  LIPASE, BLOOD - Abnormal; Notable for the following components:      Result Value   Lipase <10 (*)    All other components within normal limits  COMPREHENSIVE METABOLIC PANEL WITH GFR - Abnormal; Notable for the following components:   Sodium 134 (*)    Chloride 95 (*)    Glucose, Bld 204 (*)    Total Protein 9.5 (*)    All other components within normal limits  URINALYSIS, ROUTINE W REFLEX MICROSCOPIC - Abnormal; Notable for the following components:   Hgb urine dipstick SMALL (*)    Protein, ur 100 (*)    Bacteria, UA RARE (*)    All other components within normal limits  TROPONIN T, HIGH SENSITIVITY - Abnormal; Notable for the following components:   Troponin T High Sensitivity 34 (*)    All other components within normal limits  TROPONIN T, HIGH SENSITIVITY - Abnormal; Notable for the following components:   Troponin T High Sensitivity 32 (*)    All other components within normal limits  RESP PANEL BY RT-PCR (RSV, FLU A&B, COVID)  RVPGX2  CBC  HCG, SERUM, QUALITATIVE    EKG: EKG Interpretation Date/Time:  Friday June 26 2024 09:29:29 EST Ventricular Rate:  95 PR Interval:  129 QRS Duration:  83 QT Interval:  360 QTC Calculation: 453 R Axis:   75  Text Interpretation: Sinus rhythm Left atrial enlargement LVH with secondary repolarization abnormality No significant change since last tracing Confirmed by Dean Clarity [phone])  on 06/26/2024 9:40:58 AM  Radiology: [facility] Chest Portable 1 View Result Date: 06/26/2024 EXAM: 1 VIEW(S) XRAY OF THE CHEST 06/26/2024 11:26:00 AM COMPARISON: 02/13/2024 CLINICAL HISTORY: Hypertension. FINDINGS: LUNGS AND PLEURA: Low lung volume. Upper zone pulmonary vascular prominence favoring pulmonary venous hypertension, with mild perihilar interstitial excitation potentially reflecting early interstitial edema, although without definite Kerley b lines. No focal pulmonary opacity. No pleural effusion. No pneumothorax. HEART AND MEDIASTINUM: Moderate cardiomegaly, similar to prior. BONES AND SOFT  TISSUES: No acute osseous abnormality. IMPRESSION: 1. Moderate cardiomegaly, similar to prior. 2. Upper lung zone pulmonary vascular prominence favoring pulmonary venous hypertension, with mild perihilar interstitial prominence that may reflect early interstitial edema without definite Kerley B lines. Electronically signed by: Ryan Salvage MD 06/26/2024 11:49 AM EST RP Workstation: [id number]   CT Head Wo Contrast Result Date: 06/26/2024 EXAM: CT HEAD WITHOUT CONTRAST 06/26/2024 11:13:10 AM TECHNIQUE: CT of the head was performed without the administration of intravenous contrast. Automated exposure control, iterative reconstruction, and/or weight based adjustment of the mA/kV was utilized to reduce the radiation dose to as low as reasonably achievable. COMPARISON: CT of the head dated 02/13/2019. CLINICAL HISTORY: Headache, increasing frequency or severity. FINDINGS: BRAIN AND VENTRICLES: No acute hemorrhage. No evidence of acute infarct. No hydrocephalus. No extra-axial collection. No mass effect or midline shift. ORBITS: No acute abnormality. SINUSES: No acute abnormality. SOFT TISSUES AND SKULL: No acute soft tissue abnormality. No skull fracture. IMPRESSION: 1. No acute intracranial abnormality, compared to 02/13/2019. Electronically signed by: Evalene Coho MD 06/26/2024 11:17 AM EST RP Workstation:  [id number]     Procedures   Medications Ordered in the ED  sodium chloride  0.9 % bolus 1,000 mL (1,000 mLs Intravenous New Bag/Given 06/26/24 0946)  ondansetron  (ZOFRAN ) injection 4 mg (4 mg Intravenous Given 06/26/24 0945)  hydrALAZINE  (APRESOLINE ) injection 20 mg (20 mg Intravenous Given 06/26/24 1003)  labetalol  (NORMODYNE ) injection 10 mg (10 mg Intravenous Given 06/26/24 1210)                                    Medical Decision Making Amount and/or Complexity of Data Reviewed Labs: ordered. Radiology: ordered.  Risk Prescription drug management.   This patient presents to the ED for concern of n/v/malaise, this involves an extensive number of treatment options, and is a complaint that carries with it a high risk of complications and morbidity.  The differential diagnosis includes infection, electrolyte abn, cardiac event, htn, covid/flu/rsv   Co morbidities that complicate the patient evaluation  htn, hld, anemia, CAD, dm, and obesity   Additional history obtained:  Additional history obtained from epic chart review   Lab Tests:  I Ordered, and personally interpreted labs.  The pertinent results include:  cbc nl, cmp nl other than glucose elevated at 204; lip nl, preg neg; ua neg; trop flat at 34 and 32   Imaging Studies ordered:  I ordered imaging studies including cxr and head ct  I independently visualized and interpreted imaging which showed CXR: Moderate cardiomegaly, similar to prior.  2. Upper lung zone pulmonary vascular prominence favoring pulmonary venous  hypertension, with mild perihilar interstitial prominence that may reflect  early interstitial edema without definite Kerley B lines.  CT head: No acute intracranial abnormality, compared to 02/13/2019.  I agree with the radiologist interpretation   Cardiac Monitoring:  The patient was maintained on a cardiac monitor.  I personally viewed and interpreted the cardiac monitored which showed an  underlying rhythm of: nsr   Medicines ordered and prescription drug management:  I ordered medication including zofran , hydralazine , labetalol   for sx  Reevaluation of the patient after these medicines showed that the patient improved I have reviewed the patients home medicines and have made adjustments as needed   Test Considered:  ct  Problem List / ED Course:  N/v:  improved.  Pt is able to tolerate po fluids. HTN:  bp improved.  She asked for med refills, so I am not sure she's been taking her meds properly SOB:  pt able to ambulate without problems.  Portable pulse ox not reading O2 sat through fingernails, but room pulse ox reading 99%.  Pt does not feel sob with ambulation any more.   Reevaluation:  After the interventions noted above, I reevaluated the patient and found that they have :improved   Social Determinants of Health:  Lives at home   Dispostion:  After consideration of the diagnostic results and the patients response to treatment, I feel that the patent would benefit from discharge with outpatient f/u.       Final diagnoses:  Nausea and vomiting, unspecified vomiting type  Hypertension, unspecified type  Hyperglycemia due to diabetes mellitus [facility])    ED Discharge Orders          Ordered    ondansetron  (ZOFRAN -ODT) 4 MG disintegrating tablet  Every 8 hours PRN        06/26/24 1332    carvedilol  (COREG ) 25 MG tablet  2 times daily with meals       Note to Pharmacy: New dose   06/26/24 1332    empagliflozin  (JARDIANCE ) 10 MG TABS tablet  Daily        06/26/24 1332    spironolactone  (ALDACTONE ) 50 MG tablet  Daily        06/26/24 1332    valsartan -hydrochlorothiazide  (DIOVAN -HCT) 320-25 MG tablet  Daily        06/26/24 1332               Dean Clarity, MD 06/26/24 1336  "

## 2024-06-26 NOTE — ED Triage Notes (Signed)
 Emesis x yesterday, pt reports unsure what it is related to, endorsees vomiting this morning with a little blood, pt denies cold and flu s/s, denies pregnancy, denies changes in diet
